# Patient Record
Sex: Male | Born: 1935
Health system: Southern US, Community
[De-identification: ages and names within clinical notes are randomized; demographics above are authoritative.]

## PROBLEM LIST (undated history)

## (undated) DIAGNOSIS — I480 Paroxysmal atrial fibrillation: Secondary | ICD-10-CM

## (undated) DIAGNOSIS — I639 Cerebral infarction, unspecified: Secondary | ICD-10-CM

## (undated) DIAGNOSIS — K219 Gastro-esophageal reflux disease without esophagitis: Secondary | ICD-10-CM

## (undated) DIAGNOSIS — I25810 Atherosclerosis of coronary artery bypass graft(s) without angina pectoris: Secondary | ICD-10-CM

## (undated) DIAGNOSIS — I10 Essential (primary) hypertension: Secondary | ICD-10-CM

## (undated) DIAGNOSIS — N400 Enlarged prostate without lower urinary tract symptoms: Secondary | ICD-10-CM

## (undated) DIAGNOSIS — I4510 Unspecified right bundle-branch block: Secondary | ICD-10-CM

## (undated) DIAGNOSIS — E785 Hyperlipidemia, unspecified: Secondary | ICD-10-CM

## (undated) HISTORY — DX: Essential (primary) hypertension: I10

## (undated) HISTORY — DX: Paroxysmal atrial fibrillation: I48.0

## (undated) HISTORY — PX: LOOP RECORDER IMPLANT: SHX5954

## (undated) HISTORY — PX: CARDIAC SURGERY: SHX584

## (undated) HISTORY — DX: Atherosclerosis of coronary artery bypass graft(s) without angina pectoris: I25.810

## (undated) HISTORY — DX: Unspecified right bundle-branch block: I45.10

## (undated) HISTORY — DX: Hyperlipidemia, unspecified: E78.5

---

## 1983-04-13 HISTORY — PX: PROSTATE SURGERY: SHX751

## 1998-04-12 HISTORY — PX: CARDIAC SURGERY: SHX584

## 1998-11-03 ENCOUNTER — Ambulatory Visit (HOSPITAL_COMMUNITY): Admission: RE | Admit: 1998-11-03 | Discharge: 1998-11-03 | Payer: Self-pay | Admitting: Cardiology

## 1998-11-24 ENCOUNTER — Encounter: Payer: Self-pay | Admitting: Cardiothoracic Surgery

## 1998-11-25 ENCOUNTER — Encounter: Payer: Self-pay | Admitting: Cardiothoracic Surgery

## 1998-11-25 ENCOUNTER — Inpatient Hospital Stay (HOSPITAL_COMMUNITY): Admission: RE | Admit: 1998-11-25 | Discharge: 1998-11-30 | Payer: Self-pay | Admitting: Cardiothoracic Surgery

## 1998-11-26 ENCOUNTER — Encounter: Payer: Self-pay | Admitting: Cardiothoracic Surgery

## 1998-11-27 ENCOUNTER — Encounter: Payer: Self-pay | Admitting: Cardiothoracic Surgery

## 1998-11-28 ENCOUNTER — Encounter: Payer: Self-pay | Admitting: Cardiology

## 1998-12-16 ENCOUNTER — Encounter (HOSPITAL_COMMUNITY): Admission: RE | Admit: 1998-12-16 | Discharge: 1999-03-16 | Payer: Self-pay | Admitting: Cardiology

## 2000-11-24 ENCOUNTER — Encounter: Payer: Self-pay | Admitting: Internal Medicine

## 2000-11-24 ENCOUNTER — Encounter: Admission: RE | Admit: 2000-11-24 | Discharge: 2000-11-24 | Payer: Self-pay | Admitting: Internal Medicine

## 2001-10-24 ENCOUNTER — Ambulatory Visit (HOSPITAL_COMMUNITY): Admission: RE | Admit: 2001-10-24 | Discharge: 2001-10-24 | Payer: Self-pay | Admitting: *Deleted

## 2003-04-13 HISTORY — PX: HERNIA REPAIR: SHX51

## 2004-10-19 ENCOUNTER — Ambulatory Visit (HOSPITAL_COMMUNITY): Admission: RE | Admit: 2004-10-19 | Discharge: 2004-10-20 | Payer: Self-pay | Admitting: General Surgery

## 2011-01-14 ENCOUNTER — Other Ambulatory Visit: Payer: Self-pay | Admitting: Dermatology

## 2011-04-22 DIAGNOSIS — H35379 Puckering of macula, unspecified eye: Secondary | ICD-10-CM | POA: Diagnosis not present

## 2011-04-26 DIAGNOSIS — I10 Essential (primary) hypertension: Secondary | ICD-10-CM | POA: Diagnosis not present

## 2011-04-26 DIAGNOSIS — E119 Type 2 diabetes mellitus without complications: Secondary | ICD-10-CM | POA: Diagnosis not present

## 2011-04-26 DIAGNOSIS — E785 Hyperlipidemia, unspecified: Secondary | ICD-10-CM | POA: Diagnosis not present

## 2011-04-26 DIAGNOSIS — Z125 Encounter for screening for malignant neoplasm of prostate: Secondary | ICD-10-CM | POA: Diagnosis not present

## 2011-04-29 DIAGNOSIS — E119 Type 2 diabetes mellitus without complications: Secondary | ICD-10-CM | POA: Diagnosis not present

## 2011-04-29 DIAGNOSIS — I10 Essential (primary) hypertension: Secondary | ICD-10-CM | POA: Diagnosis not present

## 2011-04-29 DIAGNOSIS — E7800 Pure hypercholesterolemia, unspecified: Secondary | ICD-10-CM | POA: Diagnosis not present

## 2011-07-20 DIAGNOSIS — L570 Actinic keratosis: Secondary | ICD-10-CM | POA: Diagnosis not present

## 2011-07-20 DIAGNOSIS — L821 Other seborrheic keratosis: Secondary | ICD-10-CM | POA: Diagnosis not present

## 2011-07-20 DIAGNOSIS — D239 Other benign neoplasm of skin, unspecified: Secondary | ICD-10-CM | POA: Diagnosis not present

## 2011-09-15 DIAGNOSIS — J449 Chronic obstructive pulmonary disease, unspecified: Secondary | ICD-10-CM | POA: Diagnosis not present

## 2011-09-15 DIAGNOSIS — R079 Chest pain, unspecified: Secondary | ICD-10-CM | POA: Diagnosis not present

## 2011-09-15 DIAGNOSIS — J984 Other disorders of lung: Secondary | ICD-10-CM | POA: Diagnosis not present

## 2011-10-19 DIAGNOSIS — H40059 Ocular hypertension, unspecified eye: Secondary | ICD-10-CM | POA: Diagnosis not present

## 2011-10-19 DIAGNOSIS — H2510 Age-related nuclear cataract, unspecified eye: Secondary | ICD-10-CM | POA: Diagnosis not present

## 2011-10-19 DIAGNOSIS — H35379 Puckering of macula, unspecified eye: Secondary | ICD-10-CM | POA: Diagnosis not present

## 2011-10-19 DIAGNOSIS — H353190 Nonexudative age-related macular degeneration, unspecified eye, stage unspecified: Secondary | ICD-10-CM | POA: Diagnosis not present

## 2011-11-01 DIAGNOSIS — E119 Type 2 diabetes mellitus without complications: Secondary | ICD-10-CM | POA: Diagnosis not present

## 2011-11-01 DIAGNOSIS — I10 Essential (primary) hypertension: Secondary | ICD-10-CM | POA: Diagnosis not present

## 2011-11-04 DIAGNOSIS — I10 Essential (primary) hypertension: Secondary | ICD-10-CM | POA: Diagnosis not present

## 2011-11-04 DIAGNOSIS — E119 Type 2 diabetes mellitus without complications: Secondary | ICD-10-CM | POA: Diagnosis not present

## 2011-11-11 DIAGNOSIS — H2589 Other age-related cataract: Secondary | ICD-10-CM | POA: Diagnosis not present

## 2011-11-11 DIAGNOSIS — H33009 Unspecified retinal detachment with retinal break, unspecified eye: Secondary | ICD-10-CM | POA: Diagnosis not present

## 2011-11-11 DIAGNOSIS — E119 Type 2 diabetes mellitus without complications: Secondary | ICD-10-CM | POA: Diagnosis not present

## 2011-11-11 DIAGNOSIS — H35059 Retinal neovascularization, unspecified, unspecified eye: Secondary | ICD-10-CM | POA: Diagnosis not present

## 2011-11-11 DIAGNOSIS — H353190 Nonexudative age-related macular degeneration, unspecified eye, stage unspecified: Secondary | ICD-10-CM | POA: Diagnosis not present

## 2011-11-11 DIAGNOSIS — H35379 Puckering of macula, unspecified eye: Secondary | ICD-10-CM | POA: Diagnosis not present

## 2011-12-02 DIAGNOSIS — R079 Chest pain, unspecified: Secondary | ICD-10-CM | POA: Diagnosis not present

## 2011-12-20 DIAGNOSIS — I2510 Atherosclerotic heart disease of native coronary artery without angina pectoris: Secondary | ICD-10-CM | POA: Diagnosis not present

## 2011-12-20 DIAGNOSIS — E785 Hyperlipidemia, unspecified: Secondary | ICD-10-CM | POA: Diagnosis not present

## 2011-12-20 DIAGNOSIS — I10 Essential (primary) hypertension: Secondary | ICD-10-CM | POA: Diagnosis not present

## 2011-12-28 DIAGNOSIS — H40059 Ocular hypertension, unspecified eye: Secondary | ICD-10-CM | POA: Diagnosis not present

## 2012-01-11 DIAGNOSIS — L821 Other seborrheic keratosis: Secondary | ICD-10-CM | POA: Diagnosis not present

## 2012-01-11 DIAGNOSIS — L570 Actinic keratosis: Secondary | ICD-10-CM | POA: Diagnosis not present

## 2012-01-11 DIAGNOSIS — Z85828 Personal history of other malignant neoplasm of skin: Secondary | ICD-10-CM | POA: Diagnosis not present

## 2012-01-26 DIAGNOSIS — Z23 Encounter for immunization: Secondary | ICD-10-CM | POA: Diagnosis not present

## 2012-04-26 DIAGNOSIS — N401 Enlarged prostate with lower urinary tract symptoms: Secondary | ICD-10-CM | POA: Diagnosis not present

## 2012-05-01 DIAGNOSIS — E119 Type 2 diabetes mellitus without complications: Secondary | ICD-10-CM | POA: Diagnosis not present

## 2012-05-01 DIAGNOSIS — I10 Essential (primary) hypertension: Secondary | ICD-10-CM | POA: Diagnosis not present

## 2012-05-04 DIAGNOSIS — N400 Enlarged prostate without lower urinary tract symptoms: Secondary | ICD-10-CM | POA: Diagnosis not present

## 2012-05-04 DIAGNOSIS — E119 Type 2 diabetes mellitus without complications: Secondary | ICD-10-CM | POA: Diagnosis not present

## 2012-05-04 DIAGNOSIS — I10 Essential (primary) hypertension: Secondary | ICD-10-CM | POA: Diagnosis not present

## 2012-06-27 DIAGNOSIS — H353190 Nonexudative age-related macular degeneration, unspecified eye, stage unspecified: Secondary | ICD-10-CM | POA: Diagnosis not present

## 2012-06-27 DIAGNOSIS — H40059 Ocular hypertension, unspecified eye: Secondary | ICD-10-CM | POA: Diagnosis not present

## 2012-07-18 ENCOUNTER — Other Ambulatory Visit: Payer: Self-pay | Admitting: Dermatology

## 2012-07-18 DIAGNOSIS — C44319 Basal cell carcinoma of skin of other parts of face: Secondary | ICD-10-CM | POA: Diagnosis not present

## 2012-07-18 DIAGNOSIS — L219 Seborrheic dermatitis, unspecified: Secondary | ICD-10-CM | POA: Diagnosis not present

## 2012-07-18 DIAGNOSIS — D485 Neoplasm of uncertain behavior of skin: Secondary | ICD-10-CM | POA: Diagnosis not present

## 2012-07-18 DIAGNOSIS — Z85828 Personal history of other malignant neoplasm of skin: Secondary | ICD-10-CM | POA: Diagnosis not present

## 2012-07-18 DIAGNOSIS — L821 Other seborrheic keratosis: Secondary | ICD-10-CM | POA: Diagnosis not present

## 2012-07-18 DIAGNOSIS — L570 Actinic keratosis: Secondary | ICD-10-CM | POA: Diagnosis not present

## 2012-10-30 DIAGNOSIS — E119 Type 2 diabetes mellitus without complications: Secondary | ICD-10-CM | POA: Diagnosis not present

## 2012-10-30 DIAGNOSIS — I10 Essential (primary) hypertension: Secondary | ICD-10-CM | POA: Diagnosis not present

## 2012-11-02 DIAGNOSIS — I10 Essential (primary) hypertension: Secondary | ICD-10-CM | POA: Diagnosis not present

## 2012-11-02 DIAGNOSIS — E119 Type 2 diabetes mellitus without complications: Secondary | ICD-10-CM | POA: Diagnosis not present

## 2012-11-02 DIAGNOSIS — N400 Enlarged prostate without lower urinary tract symptoms: Secondary | ICD-10-CM | POA: Diagnosis not present

## 2012-12-07 DIAGNOSIS — H33009 Unspecified retinal detachment with retinal break, unspecified eye: Secondary | ICD-10-CM | POA: Diagnosis not present

## 2012-12-07 DIAGNOSIS — H35059 Retinal neovascularization, unspecified, unspecified eye: Secondary | ICD-10-CM | POA: Diagnosis not present

## 2012-12-07 DIAGNOSIS — H35379 Puckering of macula, unspecified eye: Secondary | ICD-10-CM | POA: Diagnosis not present

## 2012-12-07 DIAGNOSIS — H2589 Other age-related cataract: Secondary | ICD-10-CM | POA: Diagnosis not present

## 2012-12-07 DIAGNOSIS — E119 Type 2 diabetes mellitus without complications: Secondary | ICD-10-CM | POA: Diagnosis not present

## 2012-12-07 DIAGNOSIS — H353190 Nonexudative age-related macular degeneration, unspecified eye, stage unspecified: Secondary | ICD-10-CM | POA: Diagnosis not present

## 2012-12-19 DIAGNOSIS — E785 Hyperlipidemia, unspecified: Secondary | ICD-10-CM | POA: Diagnosis not present

## 2012-12-19 DIAGNOSIS — I10 Essential (primary) hypertension: Secondary | ICD-10-CM | POA: Diagnosis not present

## 2012-12-19 DIAGNOSIS — I2510 Atherosclerotic heart disease of native coronary artery without angina pectoris: Secondary | ICD-10-CM | POA: Diagnosis not present

## 2013-01-04 DIAGNOSIS — H2510 Age-related nuclear cataract, unspecified eye: Secondary | ICD-10-CM | POA: Diagnosis not present

## 2013-01-04 DIAGNOSIS — H35379 Puckering of macula, unspecified eye: Secondary | ICD-10-CM | POA: Diagnosis not present

## 2013-01-04 DIAGNOSIS — H353190 Nonexudative age-related macular degeneration, unspecified eye, stage unspecified: Secondary | ICD-10-CM | POA: Diagnosis not present

## 2013-01-04 DIAGNOSIS — H25019 Cortical age-related cataract, unspecified eye: Secondary | ICD-10-CM | POA: Diagnosis not present

## 2013-01-18 DIAGNOSIS — D1739 Benign lipomatous neoplasm of skin and subcutaneous tissue of other sites: Secondary | ICD-10-CM | POA: Diagnosis not present

## 2013-01-18 DIAGNOSIS — L570 Actinic keratosis: Secondary | ICD-10-CM | POA: Diagnosis not present

## 2013-01-18 DIAGNOSIS — Z85828 Personal history of other malignant neoplasm of skin: Secondary | ICD-10-CM | POA: Diagnosis not present

## 2013-01-18 DIAGNOSIS — L821 Other seborrheic keratosis: Secondary | ICD-10-CM | POA: Diagnosis not present

## 2013-01-31 DIAGNOSIS — Z23 Encounter for immunization: Secondary | ICD-10-CM | POA: Diagnosis not present

## 2013-03-13 DIAGNOSIS — H2510 Age-related nuclear cataract, unspecified eye: Secondary | ICD-10-CM | POA: Diagnosis not present

## 2013-03-13 DIAGNOSIS — H2589 Other age-related cataract: Secondary | ICD-10-CM | POA: Diagnosis not present

## 2013-03-13 DIAGNOSIS — H25019 Cortical age-related cataract, unspecified eye: Secondary | ICD-10-CM | POA: Diagnosis not present

## 2013-04-13 ENCOUNTER — Telehealth: Payer: Self-pay | Admitting: Interventional Cardiology

## 2013-04-13 NOTE — Telephone Encounter (Signed)
Pt last seen by Dr Tamala Julian on 12/19/12.  BP at that office visit was 158/62.  Left message on machine for pt to contact the office.

## 2013-04-13 NOTE — Telephone Encounter (Signed)
New message     Blood pressure is rising. (176/70) pt wants to know if his medication need to change

## 2013-04-16 NOTE — Telephone Encounter (Signed)
called pt back.after pt returned home he realizes he is currently taking lisinopril 40mg  daily as well.adv pt that [place] the physician I consulted with had left today.adv him I would call him back tomorrow with bp med instructions.pt verbalized understanding.

## 2013-04-16 NOTE — Telephone Encounter (Signed)
returned pt call.per pt last o/v with Dr.Smith pt should be taking the following bp meds:Hctz 12.5mg  daily, atenolol 25mg  daily, lisinopril 40mg  daily, amlodopine 2.5mg  daily.pt sts that he bp has been running 170s/80s.pt denies any symptoms.pt sts that he has not been taking lisinopril.pt was at the store but will double check his bottles when he gets home. Per Dr.Smith o/v on 12/19/12 pt should be taking lisinopril.adv pt I would consult another physician in the office and call him back with their recommendation.pt verbalized understanding.

## 2013-04-16 NOTE — Telephone Encounter (Signed)
Follow up     Returning a nurses call from late friday

## 2013-04-17 MED ORDER — LISINOPRIL 40 MG PO TABS: 40.0000 mg | ORAL_TABLET | Freq: Every day | ORAL | Status: DC

## 2013-04-17 MED ORDER — NITROGLYCERIN 0.4 MG SL SUBL: 0.4000 mg | SUBLINGUAL_TABLET | SUBLINGUAL | Status: DC | PRN

## 2013-04-17 MED ORDER — HYDROCHLOROTHIAZIDE 12.5 MG PO CAPS: 12.5000 mg | ORAL_CAPSULE | Freq: Every day | ORAL | Status: DC

## 2013-04-17 MED ORDER — ATENOLOL 25 MG PO TABS: 25.0000 mg | ORAL_TABLET | Freq: Two times a day (BID) | ORAL | Status: DC

## 2013-04-17 MED ORDER — METFORMIN HCL 500 MG PO TABS: 250.0000 mg | ORAL_TABLET | Freq: Every day | ORAL | Status: DC

## 2013-04-17 MED ORDER — FINASTERIDE 5 MG PO TABS: 2.5000 mg | ORAL_TABLET | Freq: Every day | ORAL | Status: DC

## 2013-04-17 MED ORDER — FISH OIL 1200 MG PO CAPS: 1.0000 | ORAL_CAPSULE | Freq: Every day | ORAL | Status: DC

## 2013-04-17 MED ORDER — ASPIRIN EC 81 MG PO TBEC: 81.0000 mg | DELAYED_RELEASE_TABLET | Freq: Every day | ORAL | Status: DC

## 2013-04-17 MED ORDER — AMLODIPINE BESYLATE 5 MG PO TABS: 5.0000 mg | ORAL_TABLET | Freq: Every day | ORAL | Status: DC

## 2013-04-17 MED ORDER — OMEPRAZOLE 20 MG PO CPDR: 20.0000 mg | DELAYED_RELEASE_CAPSULE | Freq: Every day | ORAL | Status: DC

## 2013-04-17 MED ORDER — PRESERVISION AREDS 2 PO CAPS: 1.0000 | ORAL_CAPSULE | Freq: Every day | ORAL | Status: DC

## 2013-04-17 MED ORDER — SIMVASTATIN 40 MG PO TABS: 40.0000 mg | ORAL_TABLET | Freq: Every day | ORAL | Status: DC

## 2013-04-24 DIAGNOSIS — H25019 Cortical age-related cataract, unspecified eye: Secondary | ICD-10-CM | POA: Diagnosis not present

## 2013-04-24 DIAGNOSIS — H2510 Age-related nuclear cataract, unspecified eye: Secondary | ICD-10-CM | POA: Diagnosis not present

## 2013-04-24 DIAGNOSIS — H2589 Other age-related cataract: Secondary | ICD-10-CM | POA: Diagnosis not present

## 2013-05-07 DIAGNOSIS — I10 Essential (primary) hypertension: Secondary | ICD-10-CM | POA: Diagnosis not present

## 2013-05-07 DIAGNOSIS — E119 Type 2 diabetes mellitus without complications: Secondary | ICD-10-CM | POA: Diagnosis not present

## 2013-05-10 DIAGNOSIS — I10 Essential (primary) hypertension: Secondary | ICD-10-CM | POA: Diagnosis not present

## 2013-05-10 DIAGNOSIS — E785 Hyperlipidemia, unspecified: Secondary | ICD-10-CM | POA: Diagnosis not present

## 2013-05-10 DIAGNOSIS — N400 Enlarged prostate without lower urinary tract symptoms: Secondary | ICD-10-CM | POA: Diagnosis not present

## 2013-05-10 DIAGNOSIS — E119 Type 2 diabetes mellitus without complications: Secondary | ICD-10-CM | POA: Diagnosis not present

## 2013-05-11 DIAGNOSIS — N401 Enlarged prostate with lower urinary tract symptoms: Secondary | ICD-10-CM | POA: Diagnosis not present

## 2013-05-11 DIAGNOSIS — N139 Obstructive and reflux uropathy, unspecified: Secondary | ICD-10-CM | POA: Diagnosis not present

## 2013-05-21 ENCOUNTER — Telehealth: Payer: Self-pay | Admitting: Interventional Cardiology

## 2013-05-21 NOTE — Telephone Encounter (Signed)
New message         Pt would like for lisa to give him a call

## 2013-05-22 MED ORDER — AMLODIPINE BESYLATE 5 MG PO TABS: 5.0000 mg | ORAL_TABLET | Freq: Every day | ORAL | Status: DC

## 2013-05-22 NOTE — Telephone Encounter (Signed)
returned pt call pt rqst [facility]ri[facility] rx for Amlodopine 5mg  daily be faxed to the [place] attn: Dr.Heyat fax# [phone]. with identfier [id number]

## 2013-05-22 NOTE — Telephone Encounter (Addendum)
returned pt call.pt Bp medication Amlodipine was increased to 5 mg daily, under the direction of Dr.Skains, while pt primary cardiologist Dr.Smith was on vacation.  prior to increase pt bp readings were consistently elevated in the 170's/80's. Pt will continue to monitor bp at home.pt agreeable with plan and verbalized understanding.

## 2013-05-24 ENCOUNTER — Other Ambulatory Visit: Payer: Self-pay | Admitting: *Deleted

## 2013-05-24 DIAGNOSIS — H3560 Retinal hemorrhage, unspecified eye: Secondary | ICD-10-CM | POA: Diagnosis not present

## 2013-05-24 MED ORDER — AMLODIPINE BESYLATE 5 MG PO TABS: 5.0000 mg | ORAL_TABLET | Freq: Every day | ORAL | Status: DC

## 2013-06-21 DIAGNOSIS — H40019 Open angle with borderline findings, low risk, unspecified eye: Secondary | ICD-10-CM | POA: Diagnosis not present

## 2013-06-21 DIAGNOSIS — H3560 Retinal hemorrhage, unspecified eye: Secondary | ICD-10-CM | POA: Diagnosis not present

## 2013-07-25 NOTE — Telephone Encounter (Signed)
Please close this encounter

## 2013-08-07 DIAGNOSIS — E785 Hyperlipidemia, unspecified: Secondary | ICD-10-CM | POA: Diagnosis not present

## 2013-09-06 DIAGNOSIS — H3560 Retinal hemorrhage, unspecified eye: Secondary | ICD-10-CM | POA: Diagnosis not present

## 2013-11-05 DIAGNOSIS — I10 Essential (primary) hypertension: Secondary | ICD-10-CM | POA: Diagnosis not present

## 2013-11-05 DIAGNOSIS — E119 Type 2 diabetes mellitus without complications: Secondary | ICD-10-CM | POA: Diagnosis not present

## 2013-11-08 ENCOUNTER — Other Ambulatory Visit: Payer: Self-pay | Admitting: Internal Medicine

## 2013-11-08 ENCOUNTER — Other Ambulatory Visit (HOSPITAL_COMMUNITY): Payer: Self-pay | Admitting: Internal Medicine

## 2013-11-08 ENCOUNTER — Ambulatory Visit (HOSPITAL_COMMUNITY)
Admission: RE | Admit: 2013-11-08 | Discharge: 2013-11-08 | Disposition: A | Discharge status: Home / Self Care | Payer: Medicare Other | Source: Ambulatory Visit | Attending: Vascular Surgery | Admitting: Vascular Surgery

## 2013-11-08 DIAGNOSIS — I10 Essential (primary) hypertension: Secondary | ICD-10-CM | POA: Diagnosis not present

## 2013-11-08 DIAGNOSIS — R609 Edema, unspecified: Secondary | ICD-10-CM | POA: Diagnosis not present

## 2013-11-08 DIAGNOSIS — M79604 Pain in right leg: Secondary | ICD-10-CM

## 2013-11-08 DIAGNOSIS — E119 Type 2 diabetes mellitus without complications: Secondary | ICD-10-CM | POA: Diagnosis not present

## 2013-11-08 DIAGNOSIS — M79609 Pain in unspecified limb: Secondary | ICD-10-CM | POA: Diagnosis not present

## 2013-11-08 DIAGNOSIS — N400 Enlarged prostate without lower urinary tract symptoms: Secondary | ICD-10-CM | POA: Diagnosis not present

## 2013-12-13 DIAGNOSIS — H3560 Retinal hemorrhage, unspecified eye: Secondary | ICD-10-CM | POA: Diagnosis not present

## 2013-12-20 ENCOUNTER — Ambulatory Visit (INDEPENDENT_AMBULATORY_CARE_PROVIDER_SITE_OTHER): Payer: Medicare Other | Admitting: Interventional Cardiology

## 2013-12-20 ENCOUNTER — Encounter: Payer: Self-pay | Admitting: Interventional Cardiology

## 2013-12-20 VITALS — BP 152/54 | HR 49 | Ht 71.0 in | Wt 191.0 lb

## 2013-12-20 DIAGNOSIS — I25810 Atherosclerosis of coronary artery bypass graft(s) without angina pectoris: Secondary | ICD-10-CM | POA: Diagnosis not present

## 2013-12-20 DIAGNOSIS — E785 Hyperlipidemia, unspecified: Secondary | ICD-10-CM | POA: Diagnosis not present

## 2013-12-20 DIAGNOSIS — I10 Essential (primary) hypertension: Secondary | ICD-10-CM | POA: Insufficient documentation

## 2013-12-20 DIAGNOSIS — I4510 Unspecified right bundle-branch block: Secondary | ICD-10-CM

## 2013-12-20 HISTORY — DX: Atherosclerosis of coronary artery bypass graft(s) without angina pectoris: I25.810

## 2013-12-20 HISTORY — DX: Essential (primary) hypertension: I10

## 2013-12-20 HISTORY — DX: Hyperlipidemia, unspecified: E78.5

## 2013-12-20 HISTORY — DX: Unspecified right bundle-branch block: I45.10

## 2013-12-20 NOTE — Patient Instructions (Signed)
Your physician recommends that you continue on your current medications as directed. Please refer to the Current Medication list given to you today.  Your physician wants you to follow-up in: 1 year with Dr.Smith You will receive a reminder letter in the mail two months in advance. If you don't receive a letter, please call our office to schedule the follow-up appointment.  

## 2013-12-20 NOTE — Progress Notes (Signed)
Patient ID: [name], male   DOB: [birth year], 80 y.o.   MRN: [id number] Past Medical History  CASHD with CABG 2000 with LIMA to LAD, SV graft to diagonal, SVG to OM, and saphenous vein graft to distal RCA.   Hypertension   Hyperlipidemia   Benign prostatic hypertrophy   Chronic right bundle branch block   Melanoma      [address], [address], [place]  [address] Phone: [phone] Fax:  [phone]  Date:  12/20/2013   ID:  [name], DOB 08/05/1922, MRN [id number]  PCP:  Gregg Gravel, MD   ASSESSMENT:  1. Coronary artery disease, asymptomatic. Her bypass surgery 2000 2. Hyperlipidemia being managed excellently by his primary physician, Dr. [name] 3. Hypertension, controlled 4. Right bundle branch block with sinus bradycardia, no change compared to one year ago  PLAN:  1. Continue aerobic activity 2. Low-fat diet 3. Clinical followup in one year 4. No change in medical regimen   SUBJECTIVE: [name] is a 80 y.o. male who has no cardiovascular complaints. He has not had stroke or TIA like symptoms. He denies chest discomfort. No orthopnea or PND. There is stable lower extremity edema that has been present since bypass surgery in 2000. He denies palpitations and no current history of angina or syncope.   Wt Readings from Last 3 Encounters:  12/20/13 191 lb (86.637 kg)     No past medical history on file.  Current Outpatient Prescriptions  Medication Sig Dispense Refill  . amLODipine (NORVASC) 5 MG tablet Take 1 tablet (5 mg total) by mouth daily.  30 tablet  1  . aspirin EC 81 MG tablet Take 1 tablet (81 mg total) by mouth daily.      Marland Kitchen atenolol (TENORMIN) 25 MG tablet Take 1 tablet (25 mg total) by mouth 2 (two) times daily.      Marland Kitchen atorvastatin (LIPITOR) 40 MG tablet Take 40 mg by mouth daily.      . finasteride (PROSCAR) 5 MG tablet Take 0.5 tablets (2.5 mg total) by mouth daily.      . hydrochlorothiazide (MICROZIDE) 12.5 MG  capsule Take 1 capsule (12.5 mg total) by mouth daily.      Marland Kitchen lisinopril (PRINIVIL,ZESTRIL) 40 MG tablet Take 1 tablet (40 mg total) by mouth daily.      . metFORMIN (GLUCOPHAGE) 500 MG tablet Take 0.5 tablets (250 mg total) by mouth daily with breakfast.      . Multiple Vitamins-Minerals (PRESERVISION AREDS 2) CAPS Take 1 capsule by mouth daily.      Marland Kitchen omeprazole (PRILOSEC) 20 MG capsule Take 1 capsule (20 mg total) by mouth daily.       No current facility-administered medications for this visit.    Allergies:    Allergies  Allergen Reactions  . Monosodium Glutamate Anaphylaxis    Social History:  The patient    ROS:  Please see the history of present illness.   Denies blood in his urinary or stool. Denies claudication. No abdominal pain no history of ulcer.   All other systems reviewed and negative.   OBJECTIVE: VS:  BP 152/54  Pulse 49  Ht 5\' 11"  (1.803 m)  Wt 191 lb (86.637 kg)  BMI 26.65 kg/m2 Well nourished, well developed, in no acute distress, elderly but fit-appearing HEENT: normal Neck: JVD flat. Carotid bruit absent  Cardiac:  normal S1, S2; RRR; 2/6 systolic murmur right sternal border.  Lungs:  clear to auscultation  bilaterally, no wheezing, rhonchi or rales Abd: soft, nontender, no hepatomegaly Ext: Edema 1+ bilateral edema. Pulses 2+ bilateral lower extremities Skin: warm and dry Neuro:  CNs 2-12 intact, no focal abnormalities noted  EKG:  Right bundle branch block, left atrial abnormality, sinus       Signed, Illene Labrador III, MD 12/20/2013 4:23 PM

## 2014-01-22 ENCOUNTER — Other Ambulatory Visit: Payer: Self-pay | Admitting: Dermatology

## 2014-01-22 DIAGNOSIS — L570 Actinic keratosis: Secondary | ICD-10-CM | POA: Diagnosis not present

## 2014-01-22 DIAGNOSIS — L218 Other seborrheic dermatitis: Secondary | ICD-10-CM | POA: Diagnosis not present

## 2014-01-22 DIAGNOSIS — Z85828 Personal history of other malignant neoplasm of skin: Secondary | ICD-10-CM | POA: Diagnosis not present

## 2014-01-22 DIAGNOSIS — D171 Benign lipomatous neoplasm of skin and subcutaneous tissue of trunk: Secondary | ICD-10-CM | POA: Diagnosis not present

## 2014-01-22 DIAGNOSIS — L821 Other seborrheic keratosis: Secondary | ICD-10-CM | POA: Diagnosis not present

## 2014-01-22 DIAGNOSIS — D485 Neoplasm of uncertain behavior of skin: Secondary | ICD-10-CM | POA: Diagnosis not present

## 2014-02-04 DIAGNOSIS — I10 Essential (primary) hypertension: Secondary | ICD-10-CM | POA: Diagnosis not present

## 2014-02-04 DIAGNOSIS — E119 Type 2 diabetes mellitus without complications: Secondary | ICD-10-CM | POA: Diagnosis not present

## 2014-02-07 DIAGNOSIS — E119 Type 2 diabetes mellitus without complications: Secondary | ICD-10-CM | POA: Diagnosis not present

## 2014-02-07 DIAGNOSIS — E785 Hyperlipidemia, unspecified: Secondary | ICD-10-CM | POA: Diagnosis not present

## 2014-02-07 DIAGNOSIS — Z23 Encounter for immunization: Secondary | ICD-10-CM | POA: Diagnosis not present

## 2014-02-07 DIAGNOSIS — I10 Essential (primary) hypertension: Secondary | ICD-10-CM | POA: Diagnosis not present

## 2014-02-08 ENCOUNTER — Encounter: Payer: Self-pay | Admitting: Interventional Cardiology

## 2014-03-18 DIAGNOSIS — H3532 Exudative age-related macular degeneration: Secondary | ICD-10-CM | POA: Diagnosis not present

## 2014-03-21 DIAGNOSIS — H35373 Puckering of macula, bilateral: Secondary | ICD-10-CM | POA: Diagnosis not present

## 2014-03-21 DIAGNOSIS — H3531 Nonexudative age-related macular degeneration: Secondary | ICD-10-CM | POA: Diagnosis not present

## 2014-03-21 DIAGNOSIS — H3532 Exudative age-related macular degeneration: Secondary | ICD-10-CM | POA: Diagnosis not present

## 2014-03-30 ENCOUNTER — Inpatient Hospital Stay (HOSPITAL_COMMUNITY): Payer: Medicare Other

## 2014-03-30 ENCOUNTER — Emergency Department (HOSPITAL_COMMUNITY): Payer: Medicare Other

## 2014-03-30 ENCOUNTER — Encounter (HOSPITAL_COMMUNITY): Payer: Self-pay | Admitting: *Deleted

## 2014-03-30 ENCOUNTER — Inpatient Hospital Stay (HOSPITAL_COMMUNITY)
Admission: EM | Admit: 2014-03-30 | Discharge: 2014-04-02 | DRG: 065 | Disposition: A | Discharge status: Home / Self Care | Payer: Medicare Other | Attending: Internal Medicine | Admitting: Internal Medicine

## 2014-03-30 DIAGNOSIS — I63132 Cerebral infarction due to embolism of left carotid artery: Secondary | ICD-10-CM | POA: Diagnosis not present

## 2014-03-30 DIAGNOSIS — I10 Essential (primary) hypertension: Secondary | ICD-10-CM | POA: Diagnosis present

## 2014-03-30 DIAGNOSIS — I25700 Atherosclerosis of coronary artery bypass graft(s), unspecified, with unstable angina pectoris: Secondary | ICD-10-CM | POA: Diagnosis not present

## 2014-03-30 DIAGNOSIS — I4510 Unspecified right bundle-branch block: Secondary | ICD-10-CM | POA: Diagnosis present

## 2014-03-30 DIAGNOSIS — I25810 Atherosclerosis of coronary artery bypass graft(s) without angina pectoris: Secondary | ICD-10-CM | POA: Diagnosis present

## 2014-03-30 DIAGNOSIS — Z951 Presence of aortocoronary bypass graft: Secondary | ICD-10-CM | POA: Diagnosis not present

## 2014-03-30 DIAGNOSIS — I6521 Occlusion and stenosis of right carotid artery: Secondary | ICD-10-CM | POA: Diagnosis present

## 2014-03-30 DIAGNOSIS — I6523 Occlusion and stenosis of bilateral carotid arteries: Secondary | ICD-10-CM | POA: Diagnosis not present

## 2014-03-30 DIAGNOSIS — R55 Syncope and collapse: Secondary | ICD-10-CM

## 2014-03-30 DIAGNOSIS — I340 Nonrheumatic mitral (valve) insufficiency: Secondary | ICD-10-CM | POA: Diagnosis not present

## 2014-03-30 DIAGNOSIS — R200 Anesthesia of skin: Secondary | ICD-10-CM | POA: Diagnosis not present

## 2014-03-30 DIAGNOSIS — E785 Hyperlipidemia, unspecified: Secondary | ICD-10-CM | POA: Diagnosis not present

## 2014-03-30 DIAGNOSIS — S299XXA Unspecified injury of thorax, initial encounter: Secondary | ICD-10-CM | POA: Diagnosis not present

## 2014-03-30 DIAGNOSIS — Z7982 Long term (current) use of aspirin: Secondary | ICD-10-CM

## 2014-03-30 DIAGNOSIS — I63512 Cerebral infarction due to unspecified occlusion or stenosis of left middle cerebral artery: Secondary | ICD-10-CM | POA: Diagnosis not present

## 2014-03-30 DIAGNOSIS — S0990XA Unspecified injury of head, initial encounter: Secondary | ICD-10-CM | POA: Diagnosis not present

## 2014-03-30 DIAGNOSIS — Z888 Allergy status to other drugs, medicaments and biological substances status: Secondary | ICD-10-CM | POA: Diagnosis not present

## 2014-03-30 DIAGNOSIS — I639 Cerebral infarction, unspecified: Secondary | ICD-10-CM | POA: Diagnosis not present

## 2014-03-30 DIAGNOSIS — I6503 Occlusion and stenosis of bilateral vertebral arteries: Secondary | ICD-10-CM | POA: Diagnosis not present

## 2014-03-30 DIAGNOSIS — I6789 Other cerebrovascular disease: Secondary | ICD-10-CM | POA: Diagnosis not present

## 2014-03-30 DIAGNOSIS — I369 Nonrheumatic tricuspid valve disorder, unspecified: Secondary | ICD-10-CM | POA: Diagnosis not present

## 2014-03-30 LAB — COMPREHENSIVE METABOLIC PANEL
ALBUMIN: 3.8 g/dL (ref 3.5–5.2)
ALT: 11 U/L (ref 0–53)
ANION GAP: 12 (ref 5–15)
AST: 15 U/L (ref 0–37)
Alkaline Phosphatase: 78 U/L (ref 39–117)
BILIRUBIN TOTAL: 0.6 mg/dL (ref 0.3–1.2)
BUN: 23 mg/dL (ref 6–23)
CALCIUM: 9 mg/dL (ref 8.4–10.5)
CHLORIDE: 106 meq/L (ref 96–112)
CO2: 24 mEq/L (ref 19–32)
CREATININE: 0.96 mg/dL (ref 0.50–1.35)
GFR calc Af Amer: 83 mL/min — ABNORMAL LOW (ref >90)
GFR calc non Af Amer: 71 mL/min — ABNORMAL LOW (ref >90)
Glucose, Bld: 108 mg/dL — ABNORMAL HIGH (ref 70–99)
Potassium: 4.1 mEq/L (ref 3.7–5.3)
Sodium: 142 mEq/L (ref 137–147)
TOTAL PROTEIN: 6.5 g/dL (ref 6.0–8.3)

## 2014-03-30 LAB — TROPONIN I: Troponin I: <0.3 ng/mL (ref <0.30)

## 2014-03-30 LAB — CBC WITH DIFFERENTIAL/PLATELET
BASOS ABS: 0 10*3/uL (ref 0.0–0.1)
BASOS PCT: 0 % (ref 0–1)
EOS ABS: 0.1 10*3/uL (ref 0.0–0.7)
Eosinophils Relative: 2 % (ref 0–5)
HEMATOCRIT: 39.6 % (ref 39.0–52.0)
Hemoglobin: 13.4 g/dL (ref 13.0–17.0)
Lymphocytes Relative: 13 % (ref 12–46)
Lymphs Abs: 0.8 10*3/uL (ref 0.7–4.0)
MCH: 29.5 pg (ref 26.0–34.0)
MCHC: 33.8 g/dL (ref 30.0–36.0)
MCV: 87.2 fL (ref 78.0–100.0)
MONO ABS: 0.5 10*3/uL (ref 0.1–1.0)
Monocytes Relative: 9 % (ref 3–12)
Neutro Abs: 4.7 10*3/uL (ref 1.7–7.7)
Neutrophils Relative %: 76 % (ref 43–77)
Platelets: 207 10*3/uL (ref 150–400)
RBC: 4.54 MIL/uL (ref 4.22–5.81)
RDW: 13.9 % (ref 11.5–15.5)
WBC: 6.2 10*3/uL (ref 4.0–10.5)

## 2014-03-30 MED ORDER — FINASTERIDE 5 MG PO TABS
2.5000 mg | ORAL_TABLET | Freq: Every day | ORAL | Status: DC
  Administered 2014-03-30 – 2014-04-02 (×4): 2.5 mg via ORAL
  Filled 2014-03-30 (×4): qty 1

## 2014-03-30 MED ORDER — LISINOPRIL 20 MG PO TABS
40.0000 mg | ORAL_TABLET | Freq: Every day | ORAL | Status: DC
  Administered 2014-03-31 – 2014-04-02 (×3): 40 mg via ORAL
  Filled 2014-03-30 (×3): qty 2

## 2014-03-30 MED ORDER — SODIUM CHLORIDE 0.9 % IV SOLN: INTRAVENOUS | Status: DC

## 2014-03-30 MED ORDER — AMLODIPINE BESYLATE 5 MG PO TABS
5.0000 mg | ORAL_TABLET | Freq: Every day | ORAL | Status: DC
  Administered 2014-03-30: 5 mg via ORAL
  Filled 2014-03-30: qty 1

## 2014-03-30 MED ORDER — HYDRALAZINE HCL 20 MG/ML IJ SOLN: 10.0000 mg | Freq: Four times a day (QID) | INTRAMUSCULAR | Status: DC | PRN

## 2014-03-30 MED ORDER — PANTOPRAZOLE SODIUM 40 MG PO TBEC
40.0000 mg | DELAYED_RELEASE_TABLET | Freq: Every day | ORAL | Status: DC
  Administered 2014-03-30 – 2014-04-02 (×4): 40 mg via ORAL
  Filled 2014-03-30 (×4): qty 1

## 2014-03-30 MED ORDER — ATENOLOL 25 MG PO TABS
25.0000 mg | ORAL_TABLET | Freq: Two times a day (BID) | ORAL | Status: DC
  Administered 2014-03-30: 25 mg via ORAL
  Filled 2014-03-30: qty 1

## 2014-03-30 MED ORDER — PRESERVISION AREDS 2 PO CAPS: 1.0000 | ORAL_CAPSULE | Freq: Every day | ORAL | Status: DC

## 2014-03-30 MED ORDER — OCUVITE-LUTEIN PO CAPS
1.0000 | ORAL_CAPSULE | Freq: Every day | ORAL | Status: DC
Start: 2014-03-30 — End: 2014-04-02
  Administered 2014-03-30 – 2014-04-02 (×4): 1 via ORAL
  Filled 2014-03-30 (×4): qty 1

## 2014-03-30 MED ORDER — ASPIRIN 300 MG RE SUPP: 300.0000 mg | Freq: Every day | RECTAL | Status: DC

## 2014-03-30 MED ORDER — ATORVASTATIN CALCIUM 40 MG PO TABS: 40.0000 mg | ORAL_TABLET | Freq: Every day | ORAL | Status: DC

## 2014-03-30 MED ORDER — METFORMIN HCL 500 MG PO TABS: 250.0000 mg | ORAL_TABLET | Freq: Every day | ORAL | Status: DC

## 2014-03-30 MED ORDER — ASPIRIN 325 MG PO TABS
325.0000 mg | ORAL_TABLET | Freq: Every day | ORAL | Status: DC
  Administered 2014-03-31 – 2014-04-02 (×3): 325 mg via ORAL
  Filled 2014-03-30 (×4): qty 1

## 2014-03-30 MED ORDER — LISINOPRIL 20 MG PO TABS
40.0000 mg | ORAL_TABLET | Freq: Every day | ORAL | Status: DC
  Administered 2014-03-30: 40 mg via ORAL
  Filled 2014-03-30: qty 2

## 2014-03-30 MED ORDER — ATENOLOL 25 MG PO TABS
25.0000 mg | ORAL_TABLET | Freq: Every day | ORAL | Status: DC
  Administered 2014-04-01 – 2014-04-02 (×2): 25 mg via ORAL
  Filled 2014-03-30 (×3): qty 1

## 2014-03-30 MED ORDER — HYDROCHLOROTHIAZIDE 12.5 MG PO CAPS
12.5000 mg | ORAL_CAPSULE | Freq: Every day | ORAL | Status: DC
  Administered 2014-03-30: 12.5 mg via ORAL
  Filled 2014-03-30: qty 1

## 2014-03-30 MED ORDER — HEPARIN SODIUM (PORCINE) 5000 UNIT/ML IJ SOLN
5000.0000 [IU] | Freq: Three times a day (TID) | INTRAMUSCULAR | Status: DC
  Administered 2014-03-30 – 2014-04-02 (×9): 5000 [IU] via SUBCUTANEOUS
  Filled 2014-03-30 (×10): qty 1

## 2014-03-30 MED ORDER — SENNOSIDES-DOCUSATE SODIUM 8.6-50 MG PO TABS
1.0000 | ORAL_TABLET | Freq: Every evening | ORAL | Status: DC | PRN
Start: 2014-03-30 — End: 2014-04-02

## 2014-03-30 MED ORDER — IOHEXOL 350 MG/ML SOLN
50.0000 mL | Freq: Once | INTRAVENOUS | Status: AC | PRN
  Administered 2014-03-30: 50 mL via INTRAVENOUS

## 2014-03-30 MED ORDER — ATENOLOL 25 MG PO TABS: 25.0000 mg | ORAL_TABLET | Freq: Every day | ORAL | Status: DC

## 2014-03-30 MED ORDER — ASPIRIN EC 81 MG PO TBEC: 81.0000 mg | DELAYED_RELEASE_TABLET | Freq: Every day | ORAL | Status: DC

## 2014-03-30 MED ORDER — AMLODIPINE BESYLATE 5 MG PO TABS
5.0000 mg | ORAL_TABLET | Freq: Every day | ORAL | Status: DC
  Administered 2014-03-31 – 2014-04-02 (×3): 5 mg via ORAL
  Filled 2014-03-30 (×3): qty 1

## 2014-03-30 MED ORDER — STROKE: EARLY STAGES OF RECOVERY BOOK
Freq: Once | Status: AC
  Administered 2014-03-30: 16:00:00
  Filled 2014-03-30: qty 1

## 2014-03-30 NOTE — H&P (Signed)
PATIENT DETAILS Name: [name] Age: 80 y.o. Sex: male Date of Birth: 11/20/1922 Admit Date: 03/30/2014 PCP:KIM, Jeneen Rinks, MD   CHIEF COMPLAINT:  Weakness-initially generalized-then right lower extremity weakness  HPI: [name] is a 80 y.o. male with a Past Medical History of CAD status post CABG, dyslipidemia, hypertension who presents today with the above noted complaint. Per patient, upon awakening this a.m. patient went to urinate, while urinating, patient had sudden onset of generalized weakness and fell onto the floor without losing any consciousness. While on the floor he slowly regained strength in all of his extremities except his right lower extremity. He eventually was able to call several family members for help, he claims that his right lower extremity was weak for around 15-20 minutes before recovering to usual baseline. He was subsequently brought to the emergency room, a CT scan of the head was negative for acute abnormalities. I was subsequently asked to admit this patient for further treatment. Per family and patient no speech difficulty. No history of headache, chest pain, shortness of breath, nausea, vomiting or diarrhea.   ALLERGIES:   Allergies  Allergen Reactions  . Monosodium Glutamate Anaphylaxis    PAST MEDICAL HISTORY: Past Medical History  Diagnosis Date  . Right bundle branch block 12/20/2013  . Hyperlipidemia 12/20/2013  . Essential hypertension 12/20/2013  . Coronary atherosclerosis of artery bypass graft 12/20/2013    CASHD with CABG 2000 with LIMA to LAD, SV graft to diagonal, SVG to OM, and saphenous vein graft to distal RCA.       PAST SURGICAL HISTORY: History reviewed. No pertinent past surgical history.  MEDICATIONS AT HOME: Prior to Admission medications   Medication Sig Start Date End Date Taking? Authorizing Provider  amLODipine (NORVASC) 5 MG tablet Take 1 tablet (5 mg total) by mouth daily. 05/24/13  Yes Belva Crome  III, MD  aspirin EC 81 MG tablet Take 1 tablet (81 mg total) by mouth daily. 04/17/13  Yes Belva Crome III, MD  atenolol (TENORMIN) 25 MG tablet Take 1 tablet (25 mg total) by mouth 2 (two) times daily. Patient taking differently: Take 25 mg by mouth daily.  04/17/13  Yes Belva Crome III, MD  atorvastatin (LIPITOR) 40 MG tablet Take 40 mg by mouth daily.   Yes Historical Provider, MD  finasteride (PROSCAR) 5 MG tablet Take 0.5 tablets (2.5 mg total) by mouth daily. 04/17/13  Yes Belva Crome III, MD  hydrochlorothiazide (MICROZIDE) 12.5 MG capsule Take 1 capsule (12.5 mg total) by mouth daily. 04/17/13  Yes Belva Crome III, MD  lisinopril (PRINIVIL,ZESTRIL) 40 MG tablet Take 1 tablet (40 mg total) by mouth daily. 04/17/13  Yes Belva Crome III, MD  metFORMIN (GLUCOPHAGE) 500 MG tablet Take 0.5 tablets (250 mg total) by mouth daily with breakfast. 04/17/13  Yes Sinclair Grooms, MD  Multiple Vitamins-Minerals (PRESERVISION AREDS 2) CAPS Take 1 capsule by mouth daily. Patient taking differently: Take 1 capsule by mouth 2 (two) times daily.  04/17/13  Yes Belva Crome III, MD  omeprazole (PRILOSEC) 20 MG capsule Take 1 capsule (20 mg total) by mouth daily. 04/17/13  Yes Sinclair Grooms, MD    FAMILY HISTORY: Family History  Problem Relation Age of Onset  . Hypertension Mother     SOCIAL HISTORY:  reports that he has never smoked. He does not have any smokeless tobacco history on file. He reports that he  does not drink alcohol or use illicit drugs.  REVIEW OF SYSTEMS:  Constitutional:   No  weight loss, night sweats,  Fevers, chills, fatigue.  HEENT:    No headaches, Difficulty swallowing,Tooth/dental problems,Sore throat,  No sneezing, itching, ear ache, nasal congestion, post nasal drip,   Cardio-vascular: No chest pain,  Orthopnea, PND, swelling in lower extremities, anasarca,  dizziness, palpitations  GI:  No heartburn, indigestion, abdominal pain, nausea, vomiting, diarrhea, change in        bowel habits, loss of appetite  Resp: No shortness of breath with exertion or at rest.  No excess mucus, no productive cough, No non-productive cough,  No coughing up of blood.No change in color of mucus.No wheezing.No chest wall deformity  Skin:  no rash or lesions.  GU:  no dysuria, change in color of urine, no urgency or frequency.  No flank pain.  Musculoskeletal: No joint pain or swelling.  No decreased range of motion.  No back pain.  Psych: No change in mood or affect. No depression or anxiety.  No memory loss.   PHYSICAL EXAM: Blood pressure 171/62, pulse 47, temperature 97.6 F (36.4 C), temperature source Oral, resp. rate 14, SpO2 95 %.  General appearance :Awake, alert, not in any distress. Speech Clear. Not toxic Looking HEENT: Atraumatic and Normocephalic, pupils equally reactive to light and accomodation Neck: supple, no JVD. No cervical lymphadenopathy.  Chest:Good air entry bilaterally, no added sounds  CVS: S1 S2 regular, no murmurs.  Abdomen: Bowel sounds present, Non tender and not distended with no gaurding, rigidity or rebound. Extremities: B/L Lower Ext shows no edema, both legs are warm to touch Neurology:  Non focal Skin:No Rash Wounds:N/A  LABS ON ADMISSION:   Recent Labs  03/30/14 1026  NA 142  K 4.1  CL 106  CO2 24  GLUCOSE 108*  BUN 23  CREATININE 0.96  CALCIUM 9.0    Recent Labs  03/30/14 1026  AST 15  ALT 11  ALKPHOS 78  BILITOT 0.6  PROT 6.5  ALBUMIN 3.8   No results for input(s): LIPASE, AMYLASE in the last 72 hours.  Recent Labs  03/30/14 1026  WBC 6.2  NEUTROABS 4.7  HGB 13.4  HCT 39.6  MCV 87.2  PLT 207    Recent Labs  03/30/14 1026  TROPONINI <0.30   No results for input(s): DDIMER in the last 72 hours. Invalid input(s): POCBNP   RADIOLOGIC STUDIES ON ADMISSION: Ct Head Wo Contrast  03/30/2014   CLINICAL DATA:  Syncopal episode, history of CABG, history of hypertension, history of right  bundle-branch block  EXAM: CT HEAD WITHOUT CONTRAST  TECHNIQUE: Contiguous axial images were obtained from the base of the skull through the vertex without intravenous contrast.  COMPARISON:  None.  FINDINGS: There is no evidence of mass effect, midline shift, or extra-axial fluid collections. There is no evidence of a space-occupying lesion or intracranial hemorrhage. There is no evidence of a cortical-based area of acute infarction. There is generalized cerebral atrophy. There is mild cerebellar atrophy. There is periventricular white matter low attenuation likely secondary to microangiopathy.  The ventricles and sulci are appropriate for the patient's age. The basal cisterns are patent.  Visualized portions of the orbits are unremarkable. The visualized portions of the paranasal sinuses and mastoid air cells are unremarkable. Cerebrovascular atherosclerotic calcifications are noted.  The osseous structures are unremarkable.  IMPRESSION: 1. No acute intracranial pathology. 2. Generalized cerebral atrophy and chronic microvascular disease.   Electronically Signed  By: Kathreen Devoid   On: 03/30/2014 11:04     EKG: Independently reviewed. Normal sinus rhythm  ASSESSMENT AND PLAN: Present on Admission:  . ? Acute CVA (cerebral infarction): Admit to neuro telemetry, MRI brain, per neuro checks CTA neck/head, 2-D echocardiogram. Lipid panel and A1c will be ordered. Was on ASA 81 mg prior to admission, changed to ASA 325 mg. Await further recommendations from neurology   . Hyperlipidemia: Continue statins, follow lipid panel.   . Essential hypertension: Allow some permissive hypertension, resume antihypertensives tomorrow. As needed IV hydralazine for now   . Coronary atherosclerosis of artery bypass graft: Continue aspirin/statins. Asymptomatic without any chest pain or shortness of breath   . Right bundle branch block: Continue telemetry  Further plan will depend as patient's clinical course evolves  and further radiologic and laboratory data become available. Patient will be monitored closely.  Above noted plan was discussed with patient/family, they were in agreement.   DVT Prophylaxis: Prophylactic Heparin  Code Status: Full Code  Disposition Plan: Home when workup complete.  Total time spent for admission equals 45 minutes.  [facility] Pager [phone]  If 7PM-7AM, please contact night-coverage [email or web address] Password [email or web address] 03/30/2014, 1:54 PM

## 2014-03-30 NOTE — ED Notes (Signed)
Pt arrived by [facility]. Pt reports getting up this am and went to restroom, while he was in restroom he had episode of right leg weakness/numbness and pt fell. The numbness has resolved pta. Pt hypertensive pta.

## 2014-03-30 NOTE — Consult Note (Signed)
Reason for Consult:Weakness Referring Physician: Audie Pinto  CC: Weakness  HPI: [name] is an 80 y.o. male who reports that he awakened this morning as usual.  He went to urinate and afterward became weak all over falling to the floor.  He did not lose consciousness.  He remained weak while on the floor but slowly had improvement in his strength with the left side returning some strength prior to the left and the RLE being the last to recover some strength.  He was eventually able to pull himself up to the toilet and to call his wife.  No speech abnormalities were noted.  He finally got some help to get to the bed and EMS was called.  Patient  Was brought in for evaluation.  Reports no significant weakness at this time.    Past Medical History  Diagnosis Date  . Right bundle branch block 12/20/2013  . Hyperlipidemia 12/20/2013  . Essential hypertension 12/20/2013  . Coronary atherosclerosis of artery bypass graft 12/20/2013    CASHD with CABG 2000 with LIMA to LAD, SV graft to diagonal, SVG to OM, and saphenous vein graft to distal RCA.       History reviewed. No pertinent past surgical history.  Family History  Problem Relation Age of Onset  . Hypertension Mother     Social History:  reports that he has never smoked. He does not have any smokeless tobacco history on file. He reports that he does not drink alcohol or use illicit drugs.  Allergies  Allergen Reactions  . Monosodium Glutamate Anaphylaxis    Medications: I have reviewed the patient's current medications. Prior to Admission:  Current outpatient prescriptions:  amLODipine (NORVASC) 5 MG tablet, Take 1 tablet (5 mg total) by mouth daily., Disp: 30 tablet, Rfl: 1;   aspirin EC 81 MG tablet, Take 1 tablet (81 mg total) by mouth daily., Disp: , Rfl: ;   atenolol (TENORMIN) 25 MG tablet, Take 1 tablet (25 mg total) by mouth 2 (two) times daily., Disp: , Rfl: ;   atorvastatin (LIPITOR) 40 MG tablet, Take 40 mg by mouth  daily., Disp: , Rfl:  finasteride (PROSCAR) 5 MG tablet, Take 0.5 tablets (2.5 mg total) by mouth daily., Disp: , Rfl: ;   hydrochlorothiazide (MICROZIDE) 12.5 MG capsule, Take 1 capsule (12.5 mg total) by mouth daily., Disp: , Rfl: ;   lisinopril (PRINIVIL,ZESTRIL) 40 MG tablet, Take 1 tablet (40 mg total) by mouth daily., Disp: , Rfl: ;   metFORMIN (GLUCOPHAGE) 500 MG tablet, Take 0.5 tablets (250 mg total) by mouth daily with breakfast., Disp: , Rfl:  Multiple Vitamins-Minerals (PRESERVISION AREDS 2) CAPS, Take 1 capsule by mouth daily., Disp: , Rfl: ;   omeprazole (PRILOSEC) 20 MG capsule, Take 1 capsule (20 mg total) by mouth daily., Disp: , Rfl:   ROS: History obtained from the patient  General ROS: negative for - chills, fatigue, fever, night sweats, weight gain or weight loss Psychological ROS: negative for - behavioral disorder, hallucinations, memory difficulties, mood swings or suicidal ideation Ophthalmic ROS: negative for - blurry vision, double vision, eye pain or loss of vision ENT ROS: negative for - epistaxis, nasal discharge, oral lesions, sore throat, tinnitus or vertigo Allergy and Immunology ROS: negative for - hives or itchy/watery eyes Hematological and Lymphatic ROS: negative for - bleeding problems, bruising or swollen lymph nodes Endocrine ROS: negative for - galactorrhea, hair pattern changes, polydipsia/polyuria or temperature intolerance Respiratory ROS: negative for - cough, hemoptysis, shortness of breath  or wheezing Cardiovascular ROS: negative for - chest pain, dyspnea on exertion, edema or irregular heartbeat Gastrointestinal ROS: negative for - abdominal pain, diarrhea, hematemesis, nausea/vomiting or stool incontinence Genito-Urinary ROS: negative for - dysuria, hematuria, incontinence or urinary frequency/urgency Musculoskeletal ROS: RLE swelling Neurological ROS: as noted in HPI Dermatological ROS: negative for rash and skin lesion changes  Physical  Examination: Blood pressure 182/110, pulse 54, temperature 97.6 F (36.4 C), temperature source Oral, resp. rate 20, SpO2 97 %.  HEENT-  Normocephalic, no lesions, without obvious abnormality.  Normal external eye and conjunctiva.  Normal TM's bilaterally.  Normal auditory canals and external ears. Normal external nose, mucus membranes and septum.  Normal pharynx. Cardiovascular- S1, S2 normal, pulses palpable throughout   Lungs- chest clear, no wheezing, rales, normal symmetric air entry Abdomen- soft, non-tender; bowel sounds normal; no masses,  no organomegaly Extremities- mild lower extremity edema Lymph-no adenopathy palpable Musculoskeletal-no joint tenderness, deformity or swelling Skin-LE scars from graft retrieval  Neurological Examination Mental Status: Alert, oriented, thought content appropriate.  Speech fluent without evidence of aphasia.  Able to follow 3 step commands without difficulty. Cranial Nerves: II: Discs flat bilaterally; Visual fields grossly normal, pupils equal, round, reactive to light and accommodation III,IV, VI: ptosis not present, extra-ocular motions intact bilaterally V,VII: smile symmetric, facial light touch sensation normal bilaterally VIII: hearing normal bilaterally IX,X: gag reflex present XI: bilateral shoulder shrug XII: midline tongue extension Motor: Right : Upper extremity   5/5    Left:     Upper extremity   5/5  Lower extremity   5/5     Lower extremity   5/5 Tone and bulk:normal tone throughout; no atrophy noted Sensory: Pinprick and light touch intact throughout, bilaterally Deep Tendon Reflexes: 2+ and symmetric throughout Plantars: Right: downgoing   Left: downgoing Cerebellar: normal finger-to-nose, normal rapid alternating movements and normal heel-to-shin test Gait: normal gait and station      Laboratory Studies:   Basic Metabolic Panel:  Recent Labs Lab 03/30/14 1026  NA 142  K 4.1  CL 106  CO2 24  GLUCOSE 108*   BUN 23  CREATININE 0.96  CALCIUM 9.0    Liver Function Tests:  Recent Labs Lab 03/30/14 1026  AST 15  ALT 11  ALKPHOS 78  BILITOT 0.6  PROT 6.5  ALBUMIN 3.8   No results for input(s): LIPASE, AMYLASE in the last 168 hours. No results for input(s): AMMONIA in the last 168 hours.  CBC:  Recent Labs Lab 03/30/14 1026  WBC 6.2  NEUTROABS 4.7  HGB 13.4  HCT 39.6  MCV 87.2  PLT 207    Cardiac Enzymes:  Recent Labs Lab 03/30/14 1026  TROPONINI <0.30    BNP: Invalid input(s): POCBNP  CBG: No results for input(s): GLUCAP in the last 168 hours.  Microbiology: No results found for this or any previous visit.  Coagulation Studies: No results for input(s): LABPROT, INR in the last 72 hours.  Urinalysis: No results for input(s): COLORURINE, LABSPEC, PHURINE, GLUCOSEU, HGBUR, BILIRUBINUR, KETONESUR, PROTEINUR, UROBILINOGEN, NITRITE, LEUKOCYTESUR in the last 168 hours.  Invalid input(s): APPERANCEUR  Lipid Panel:  No results found for: CHOL, TRIG, HDL, CHOLHDL, VLDL, LDLCALC  HgbA1C: No results found for: HGBA1C  Urine Drug Screen:  No results found for: LABOPIA, COCAINSCRNUR, LABBENZ, AMPHETMU, THCU, LABBARB  Alcohol Level: No results for input(s): ETH in the last 168 hours.  Other results: EKG: sinus rhythm at 52 bpm.  Imaging: Ct Head Wo Contrast  03/30/2014  CLINICAL DATA:  Syncopal episode, history of CABG, history of hypertension, history of right bundle-branch block  EXAM: CT HEAD WITHOUT CONTRAST  TECHNIQUE: Contiguous axial images were obtained from the base of the skull through the vertex without intravenous contrast.  COMPARISON:  None.  FINDINGS: There is no evidence of mass effect, midline shift, or extra-axial fluid collections. There is no evidence of a space-occupying lesion or intracranial hemorrhage. There is no evidence of a cortical-based area of acute infarction. There is generalized cerebral atrophy. There is mild cerebellar atrophy.  There is periventricular white matter low attenuation likely secondary to microangiopathy.  The ventricles and sulci are appropriate for the patient's age. The basal cisterns are patent.  Visualized portions of the orbits are unremarkable. The visualized portions of the paranasal sinuses and mastoid air cells are unremarkable. Cerebrovascular atherosclerotic calcifications are noted.  The osseous structures are unremarkable.  IMPRESSION: 1. No acute intracranial pathology. 2. Generalized cerebral atrophy and chronic microvascular disease.   Electronically Signed   By: Kathreen Devoid   On: 03/30/2014 11:04     Assessment/Plan: 80 year old male with acute onset of weakness that although occurred initially affecting all limbs, resolved in a focal manner.  Etiology unclear.  Patient did not lose consciousness.  Head CT personally reviewed and shows no acute changes.  Will investigate the possibility of a basilar abnormality or a showering of small emboli.  Can not rule out a cardiac etiology as well.   Patient on ASA at home.  Recommendations: 1.  CTA of the head and neck 2.  Echocardiogram 3.  Neuro checks 4.  Telemetry monitoring 5.  MRI of the brain without contrast 6.  Continue ASA daily 7.  BP control  Case discussed with Dr. Donavan Burnet, MD [facility] [phone] 03/30/2014, 12:35 PM

## 2014-03-30 NOTE — ED Notes (Signed)
Neuro at bedside.

## 2014-03-30 NOTE — ED Notes (Signed)
Patient transported to CT 

## 2014-03-30 NOTE — ED Provider Notes (Signed)
CSN: [id number]     Arrival date & time 03/30/14  7096 History   First MD Initiated Contact with Patient 03/30/14 1000     Chief Complaint  Patient presents with  . Numbness      HPI  Expand All Collapse All   Pt arrived by [facility]. Pt reports getting up this am and went to restroom, while he was urinating in restroom he had episode of right leg weakness/numbness and pt fell. The numbness has resolved pta. Pt hypertensive pta.  Patient feels most like to baseline except for worsening tremor.        Past Medical History  Diagnosis Date  . Right bundle branch block 12/20/2013  . Hyperlipidemia 12/20/2013  . Essential hypertension 12/20/2013  . Coronary atherosclerosis of artery bypass graft 12/20/2013    [facility] with CABG 2000 with LIMA to LAD, SV graft to diagonal, SVG to OM, and saphenous vein graft to distal RCA.      History reviewed. No pertinent past surgical history. Family History  Problem Relation Age of Onset  . Hypertension Mother    History  Substance Use Topics  . Smoking status: Never Smoker   . Smokeless tobacco: Not on file  . Alcohol Use: No    Review of Systems  All other systems reviewed and are negative  Allergies  Monosodium glutamate  Home Medications   Prior to Admission medications   Medication Sig Start Date End Date Taking? Authorizing Provider  amLODipine (NORVASC) 5 MG tablet Take 1 tablet (5 mg total) by mouth daily. 05/24/13  Yes Belva Crome III, MD  aspirin EC 81 MG tablet Take 1 tablet (81 mg total) by mouth daily. 04/17/13  Yes Belva Crome III, MD  atenolol (TENORMIN) 25 MG tablet Take 1 tablet (25 mg total) by mouth 2 (two) times daily. Patient taking differently: Take 25 mg by mouth daily.  04/17/13  Yes Belva Crome III, MD  atorvastatin (LIPITOR) 40 MG tablet Take 40 mg by mouth daily.   Yes Historical Provider, MD  finasteride (PROSCAR) 5 MG tablet Take 0.5 tablets (2.5 mg total) by mouth daily. 04/17/13  Yes Belva Crome III, MD   hydrochlorothiazide (MICROZIDE) 12.5 MG capsule Take 1 capsule (12.5 mg total) by mouth daily. 04/17/13  Yes Belva Crome III, MD  lisinopril (PRINIVIL,ZESTRIL) 40 MG tablet Take 1 tablet (40 mg total) by mouth daily. 04/17/13  Yes Belva Crome III, MD  metFORMIN (GLUCOPHAGE) 500 MG tablet Take 0.5 tablets (250 mg total) by mouth daily with breakfast. 04/17/13  Yes Sinclair Grooms, MD  Multiple Vitamins-Minerals (PRESERVISION AREDS 2) CAPS Take 1 capsule by mouth daily. Patient taking differently: Take 1 capsule by mouth 2 (two) times daily.  04/17/13  Yes Belva Crome III, MD  omeprazole (PRILOSEC) 20 MG capsule Take 1 capsule (20 mg total) by mouth daily. 04/17/13  Yes Belva Crome III, MD   BP 156/56 mmHg  Pulse 56  Temp(Src) 98 F (36.7 C) (Oral)  Resp 18  Ht 5\' 11"  (1.803 m)  Wt 190 lb (86.183 kg)  BMI 26.51 kg/m2  SpO2 98% Physical Exam  Constitutional: He is oriented to person, place, and time. He appears well-developed and well-nourished. No distress.  HENT:  Head: Normocephalic and atraumatic.  Eyes: Pupils are equal, round, and reactive to light.  Neck: Normal range of motion.  Cardiovascular: Normal rate and intact distal pulses.   Pulmonary/Chest: No respiratory distress.  Abdominal: Normal  appearance. He exhibits no distension.  Musculoskeletal: Normal range of motion.  Neurological: He is alert and oriented to person, place, and time. He displays tremor. No cranial nerve deficit. Gait normal. GCS eye subscore is 4. GCS verbal subscore is 5. GCS motor subscore is 6.  Skin: Skin is warm and dry. No rash noted.  Psychiatric: He has a normal mood and affect. His behavior is normal.  Nursing note and vitals reviewed.   ED Course  Procedures (including critical care time) Labs Review Labs Reviewed  COMPREHENSIVE METABOLIC PANEL - Abnormal; Notable for the following:    Glucose, Bld 108 (*)    GFR calc non Af Amer 71 (*)    GFR calc Af Amer 83 (*)    All other components  within normal limits  LIPID PANEL - Abnormal; Notable for the following:    Triglycerides 165 (*)    HDL 32 (*)    All other components within normal limits  CBC WITH DIFFERENTIAL  TROPONIN I  HEMOGLOBIN A1C    Imaging Review Ct Angio Head W/cm &/or Wo Cm  03/30/2014   CLINICAL DATA:  80 year old male with syncope and collapse. Initial encounter.  EXAM: CT ANGIOGRAPHY HEAD AND NECK  TECHNIQUE: Multidetector CT imaging of the head and neck was performed using the standard protocol during bolus administration of intravenous contrast. Multiplanar CT image reconstructions and MIPs were obtained to evaluate the vascular anatomy. Carotid stenosis measurements (when applicable) are obtained utilizing NASCET criteria, using the distal internal carotid diameter as the denominator.  CONTRAST:  6mL OMNIPAQUE IOHEXOL 350 MG/ML SOLN  COMPARISON:  Head CT without contrast 1056 hr the same day.  FINDINGS: CTA HEAD FINDINGS  No abnormal enhancement identified. No evidence of cortically based acute infarction identified. No midline shift, mass effect, or evidence of intracranial mass lesion.  VASCULAR FINDINGS:  Major intracranial venous structures are enhancing.  Calcified plaque and irregularity of both distal vertebral arteries, moderate stenosis on the right as the vessel enters the skull. Mild stenosis on the left. The right PICA origin remains patent. Vertebrobasilar junction is patent. Dominant appearing left AICA. No basilar artery stenosis. SCA origins are patent. Mildly irregular left PCA origin. Moderately irregular left P1 and P2 segments, with mild to moderate stenosis. Fetal type right PCA origin. Right PCA branches are within normal limits.  Extensive Calcified plaque in both ICA siphons.  High-grade stenosis at the right ICA anterior genu (series 408, image 479 and sagittal image 59). This is just proximal to the right ophthalmic artery origin which remains patent. Supra clinoid calcified plaque occurs  with up to 50% stenosis. The right ICA terminus is patent.  Calcified plaque in the left ICA siphon with moderate stenosis in the anterior genu and proximal supra clinoid segment. Left ICA terminus is patent.  MCA origins are within normal limits. The left ACA A1 segment is dominant. Normal anterior communicating artery. Visualized bilateral ACA branches are within normal limits.  Mildly to moderately irregular right MCA M1 segment, mild M1 stenosis. Right MCA branches are within normal limits. Mildly irregular left MCA M1 segment. Left MCA branches are within normal limits.  Review of the MIP images confirms the above findings.  CTA NECK FINDINGS  Negative lung apices. No superior mediastinal lymphadenopathy. Sequelae of median sternotomy. Thyroid is within normal limits for age. Evaluation of the larynx and pharynx degraded by motion. Parapharyngeal spaces, retropharyngeal space, sublingual space, submandibular glands, parotid glands, and orbits soft tissues are within normal limits. Trace  paranasal sinus mucosal thickening. Degenerative changes in the spine. No acute osseous abnormality identified. No cervical lymphadenopathy.  VASCULAR FINDINGS:  Soft and calcified plaque affecting the aortic arch. Three vessel arch configuration. No great vessel origin stenosis.  No right CCA origin stenosis. Soft and calcified plaque at the right carotid bifurcation with less than 50% stenosis. Tortuous cervical right ICA just beyond the bulb with a kinked appearance (sagittal image 33). Additional tortuosity just proximal to the skullbase with a mildly kinked appearance (coronal image 94).  No proximal right subclavian artery stenosis. Calcified plaque at the right vertebral artery origin, but no hemodynamically significant stenosis results. Mild irregularity in the V2 segment, with soft and occasionally calcified plaque. No V2 segment stenosis. Just proximal to the skullbase there is more moderate irregularity of the vessel  with calcified plaque. See the intracranial findings.  No left CCA origin stenosis. Mild motion artifact at the left carotid bifurcation. Soft and calcified plaque resulting in less than 50 % stenosis with respect to the distal vessel. Tortuous left cervical ICA distal to the bulb with additional soft and calcified plaque just below the skullbase, but no stenosis occurs. Rather the vessel appears mildly dolichoectatic.  No proximal left subclavian artery stenosis. Soft and calcified plaque at the left vertebral artery origin resulting in moderate stenosis (coronal image 126). There is also moderate stenosis of the vessel in the distal V2 segment which appears related to soft plaque (coronal image 106). The left vertebral artery remains patent to the skullbase.  Review of the MIP images confirms the above findings.  IMPRESSION: 1. Extracranial carotid arteries with no significant atherosclerotic stenosis, but tortuosity and a kinked appearance more so on the right. 2. Moderate to severe bilateral ICA siphon atherosclerotic stenosis related to bulky calcified plaque. High-grade stenosis of the right ICA anterior genu. 3. Otherwise mild anterior circulation atherosclerosis, mild right MCA M1 stenosis. 4. Overall moderate bilateral vertebral artery atherosclerosis in stenosis; moderate distal right vertebral artery and left vertebral artery origin stenoses. 5. Otherwise posterior circulation remarkable for mild to moderate left PCA atherosclerosis and stenosis. 6. Stable and negative CT appearance of the brain.   Electronically Signed   By: Lars Pinks M.D.   On: 03/30/2014 15:03   Dg Chest 2 View  03/30/2014   CLINICAL DATA:  Patient fell earlier in the day  EXAM: CHEST  2 VIEW  COMPARISON:  September 10, 2004  FINDINGS: There is underlying emphysematous change. There is slight scarring in the left base. There is no edema or consolidation. Heart is borderline enlarged. Pulmonary vascularity is within normal limits. Patient  is status post coronary artery bypass grafting. Slight prominence in the superior mediastinum is stable and felt to represent great vessel prominence in this age group. There is no adenopathy. No pneumothorax. No appreciable bone lesions.  IMPRESSION: Underlying emphysematous change. No edema or consolidation. No pneumothorax. Heart borderline enlarged.   Electronically Signed   By: Lowella Grip M.D.   On: 03/30/2014 11:28   Ct Head Wo Contrast  03/30/2014   CLINICAL DATA:  Syncopal episode, history of CABG, history of hypertension, history of right bundle-branch block  EXAM: CT HEAD WITHOUT CONTRAST  TECHNIQUE: Contiguous axial images were obtained from the base of the skull through the vertex without intravenous contrast.  COMPARISON:  None.  FINDINGS: There is no evidence of mass effect, midline shift, or extra-axial fluid collections. There is no evidence of a space-occupying lesion or intracranial hemorrhage. There is no evidence of  a cortical-based area of acute infarction. There is generalized cerebral atrophy. There is mild cerebellar atrophy. There is periventricular white matter low attenuation likely secondary to microangiopathy.  The ventricles and sulci are appropriate for the patient's age. The basal cisterns are patent.  Visualized portions of the orbits are unremarkable. The visualized portions of the paranasal sinuses and mastoid air cells are unremarkable. Cerebrovascular atherosclerotic calcifications are noted.  The osseous structures are unremarkable.  IMPRESSION: 1. No acute intracranial pathology. 2. Generalized cerebral atrophy and chronic microvascular disease.   Electronically Signed   By: Kathreen Devoid   On: 03/30/2014 11:04   Ct Angio Neck W/cm &/or Wo/cm  03/30/2014   CLINICAL DATA:  81 year old male with syncope and collapse. Initial encounter.  EXAM: CT ANGIOGRAPHY HEAD AND NECK  TECHNIQUE: Multidetector CT imaging of the head and neck was performed using the standard  protocol during bolus administration of intravenous contrast. Multiplanar CT image reconstructions and MIPs were obtained to evaluate the vascular anatomy. Carotid stenosis measurements (when applicable) are obtained utilizing NASCET criteria, using the distal internal carotid diameter as the denominator.  CONTRAST:  18mL OMNIPAQUE IOHEXOL 350 MG/ML SOLN  COMPARISON:  Head CT without contrast 1056 hr the same day.  FINDINGS: CTA HEAD FINDINGS  No abnormal enhancement identified. No evidence of cortically based acute infarction identified. No midline shift, mass effect, or evidence of intracranial mass lesion.  VASCULAR FINDINGS:  Major intracranial venous structures are enhancing.  Calcified plaque and irregularity of both distal vertebral arteries, moderate stenosis on the right as the vessel enters the skull. Mild stenosis on the left. The right PICA origin remains patent. Vertebrobasilar junction is patent. Dominant appearing left AICA. No basilar artery stenosis. SCA origins are patent. Mildly irregular left PCA origin. Moderately irregular left P1 and P2 segments, with mild to moderate stenosis. Fetal type right PCA origin. Right PCA branches are within normal limits.  Extensive Calcified plaque in both ICA siphons.  High-grade stenosis at the right ICA anterior genu (series 408, image 479 and sagittal image 59). This is just proximal to the right ophthalmic artery origin which remains patent. Supra clinoid calcified plaque occurs with up to 50% stenosis. The right ICA terminus is patent.  Calcified plaque in the left ICA siphon with moderate stenosis in the anterior genu and proximal supra clinoid segment. Left ICA terminus is patent.  MCA origins are within normal limits. The left ACA A1 segment is dominant. Normal anterior communicating artery. Visualized bilateral ACA branches are within normal limits.  Mildly to moderately irregular right MCA M1 segment, mild M1 stenosis. Right MCA branches are within  normal limits. Mildly irregular left MCA M1 segment. Left MCA branches are within normal limits.  Review of the MIP images confirms the above findings.  CTA NECK FINDINGS  Negative lung apices. No superior mediastinal lymphadenopathy. Sequelae of median sternotomy. Thyroid is within normal limits for age. Evaluation of the larynx and pharynx degraded by motion. Parapharyngeal spaces, retropharyngeal space, sublingual space, submandibular glands, parotid glands, and orbits soft tissues are within normal limits. Trace paranasal sinus mucosal thickening. Degenerative changes in the spine. No acute osseous abnormality identified. No cervical lymphadenopathy.  VASCULAR FINDINGS:  Soft and calcified plaque affecting the aortic arch. Three vessel arch configuration. No great vessel origin stenosis.  No right CCA origin stenosis. Soft and calcified plaque at the right carotid bifurcation with less than 50% stenosis. Tortuous cervical right ICA just beyond the bulb with a kinked appearance (sagittal image 33). Additional  tortuosity just proximal to the skullbase with a mildly kinked appearance (coronal image 94).  No proximal right subclavian artery stenosis. Calcified plaque at the right vertebral artery origin, but no hemodynamically significant stenosis results. Mild irregularity in the V2 segment, with soft and occasionally calcified plaque. No V2 segment stenosis. Just proximal to the skullbase there is more moderate irregularity of the vessel with calcified plaque. See the intracranial findings.  No left CCA origin stenosis. Mild motion artifact at the left carotid bifurcation. Soft and calcified plaque resulting in less than 50 % stenosis with respect to the distal vessel. Tortuous left cervical ICA distal to the bulb with additional soft and calcified plaque just below the skullbase, but no stenosis occurs. Rather the vessel appears mildly dolichoectatic.  No proximal left subclavian artery stenosis. Soft and  calcified plaque at the left vertebral artery origin resulting in moderate stenosis (coronal image 126). There is also moderate stenosis of the vessel in the distal V2 segment which appears related to soft plaque (coronal image 106). The left vertebral artery remains patent to the skullbase.  Review of the MIP images confirms the above findings.  IMPRESSION: 1. Extracranial carotid arteries with no significant atherosclerotic stenosis, but tortuosity and a kinked appearance more so on the right. 2. Moderate to severe bilateral ICA siphon atherosclerotic stenosis related to bulky calcified plaque. High-grade stenosis of the right ICA anterior genu. 3. Otherwise mild anterior circulation atherosclerosis, mild right MCA M1 stenosis. 4. Overall moderate bilateral vertebral artery atherosclerosis in stenosis; moderate distal right vertebral artery and left vertebral artery origin stenoses. 5. Otherwise posterior circulation remarkable for mild to moderate left PCA atherosclerosis and stenosis. 6. Stable and negative CT appearance of the brain.   Electronically Signed   By: Lars Pinks M.D.   On: 03/30/2014 15:03   Mr Brain Wo Contrast  03/30/2014   CLINICAL DATA:  80 year old male with weakness, fall in bathroom. No loss of consciousness. Initial encounter.  EXAM: MRI HEAD WITHOUT CONTRAST  TECHNIQUE: Multiplanar, multiecho pulse sequences of the brain and surrounding structures were obtained without intravenous contrast.  COMPARISON:  CTA head and neck 1421 hr the same day and earlier.  FINDINGS: Diffusion weighted imaging reveal several small cortically based areas of restricted diffusion in the superior left hemisphere. These include the medial left frontal lobe pre motor area (should be left ACA territory, series 4, image 34) as well as the posterior left frontal lobe closer to the motor strip (series 4, image 32).  No contralateral or posterior fossa restricted diffusion. Major intracranial vascular flow voids are  preserved.  No midline shift, mass effect, evidence of mass lesion, ventriculomegaly, extra-axial collection or acute intracranial hemorrhage. Cervicomedullary junction and pituitary are within normal limits. Negative visualized cervical spine.  There are scattered chronic micro hemorrhages in the brain, including in the cerebellum. Superimposed minimal to mild for age scattered nonspecific cerebral white matter T2 and FLAIR hyperintensity. No cortical encephalomalacia.  Visible internal auditory structures appear normal. Mastoids are clear. Trace paranasal sinus mucosal thickening. Postoperative changes to the globes. Visualized scalp soft tissues are within normal limits. Normal bone marrow signal.  IMPRESSION: 1. Several tiny acute cortical infarcts in the superior left frontal gyrus, left MCA and left ACA territories. No mass effect or hemorrhage. 2. Scattered chronic micro hemorrhages in the brain compatible with chronic small vessel disease.   Electronically Signed   By: Lars Pinks M.D.   On: 03/30/2014 17:07     EKG Interpretation  Date/Time:  Saturday March 30 2014 10:26:19 EST Ventricular Rate:  52 PR Interval:  192 QRS Duration: 158 QT Interval:  490 QTC Calculation: 456 R Axis:   1 Text Interpretation:  Sinus rhythm Right bundle branch block No  significant change since last tracing Confirmed by Thedford Bunton  MD, Izaiah Tabb  ([id number]) on 03/30/2014 10:36:27 AM     Patient was seen and evaluated in the emergency room by neurology.  I recommended admission to the hospitalist service for further evaluation workup MDM   Final diagnoses:  Syncope        Dot Lanes, MD 03/31/14 [phone]

## 2014-03-31 DIAGNOSIS — I369 Nonrheumatic tricuspid valve disorder, unspecified: Secondary | ICD-10-CM

## 2014-03-31 DIAGNOSIS — I63512 Cerebral infarction due to unspecified occlusion or stenosis of left middle cerebral artery: Secondary | ICD-10-CM

## 2014-03-31 LAB — LIPID PANEL
Cholesterol: 147 mg/dL (ref 0–200)
HDL: 32 mg/dL — ABNORMAL LOW (ref >39)
LDL Cholesterol: 82 mg/dL (ref 0–99)
Total CHOL/HDL Ratio: 4.6 RATIO
Triglycerides: 165 mg/dL — ABNORMAL HIGH (ref <150)
VLDL: 33 mg/dL (ref 0–40)

## 2014-03-31 LAB — HEMOGLOBIN A1C
HEMOGLOBIN A1C: 6.1 % — AB (ref <5.7)
MEAN PLASMA GLUCOSE: 128 mg/dL — AB (ref <117)

## 2014-03-31 MED ORDER — ATORVASTATIN CALCIUM 80 MG PO TABS
80.0000 mg | ORAL_TABLET | Freq: Every day | ORAL | Status: DC
  Administered 2014-03-31 – 2014-04-01 (×2): 80 mg via ORAL
  Filled 2014-03-31 (×2): qty 1

## 2014-03-31 NOTE — Progress Notes (Signed)
Utilization Review Completed.Tyjanae Bartek T12/20/2015  

## 2014-03-31 NOTE — Progress Notes (Signed)
STROKE TEAM PROGRESS NOTE   HISTORY [name] is an 80 y.o. male who reports that he awakened this morning as usual. He went to urinate and afterward became weak all over falling to the floor. He did not lose consciousness. He remained weak while on the floor but slowly had improvement in his strength with the left side returning some strength prior to the left and the RLE being the last to recover some strength. He was eventually able to pull himself up to the toilet and to call his wife. No speech abnormalities were noted. He finally got some help to get to the bed and EMS was called. Patient Was brought in for evaluation. Reports no significant weakness at this time.    SUBJECTIVE (INTERVAL HISTORY) The patient's wife and daughter are both in the room. The patient feels that he is back to baseline. Discussed patient's carotid stenosis. Explained that this will need to be addressed at some point in the future.   OBJECTIVE Temp:  [97.6 F (36.4 C)-98.2 F (36.8 C)] 98 F (36.7 C) (12/20 0620) Pulse Rate:  [46-58] 56 (12/20 0620) Cardiac Rhythm:  [-] Sinus bradycardia (12/20 0620) Resp:  [13-20] 18 (12/20 0620) BP: (121-201)/(48-110) 156/56 mmHg (12/20 0620) SpO2:  [95 %-98 %] 98 % (12/20 0620) Weight:  [190 lb (86.183 kg)] 190 lb (86.183 kg) (12/19 1821)  No results for input(s): GLUCAP in the last 168 hours.  Recent Labs Lab 03/30/14 1026  NA 142  K 4.1  CL 106  CO2 24  GLUCOSE 108*  BUN 23  CREATININE 0.96  CALCIUM 9.0    Recent Labs Lab 03/30/14 1026  AST 15  ALT 11  ALKPHOS 78  BILITOT 0.6  PROT 6.5  ALBUMIN 3.8    Recent Labs Lab 03/30/14 1026  WBC 6.2  NEUTROABS 4.7  HGB 13.4  HCT 39.6  MCV 87.2  PLT 207    Recent Labs Lab 03/30/14 1026  TROPONINI <0.30   No results for input(s): LABPROT, INR in the last 72 hours. No results for input(s): COLORURINE, LABSPEC, Amo, GLUCOSEU, HGBUR, BILIRUBINUR, KETONESUR, PROTEINUR,  UROBILINOGEN, NITRITE, LEUKOCYTESUR in the last 72 hours.  Invalid input(s): APPERANCEUR     Component Value Date/Time   CHOL 147 03/31/2014 0338   TRIG 165* 03/31/2014 0338   HDL 32* 03/31/2014 0338   CHOLHDL 4.6 03/31/2014 0338   VLDL 33 03/31/2014 0338   LDLCALC 82 03/31/2014 0338   No results found for: HGBA1C No results found for: LABOPIA, COCAINSCRNUR, LABBENZ, AMPHETMU, THCU, LABBARB  No results for input(s): ETH in the last 168 hours.  Ct Angio Head and Neck W/cm &/or Wo Cm 03/30/2014    1. Extracranial carotid arteries with no significant atherosclerotic stenosis, but tortuosity and a kinked appearance more so on the right.  2. Moderate to severe bilateral ICA siphon atherosclerotic stenosis related to bulky calcified plaque. High-grade stenosis of the right ICA anterior genu.  3. Otherwise mild anterior circulation atherosclerosis, mild right MCA M1 stenosis.  4. Overall moderate bilateral vertebral artery atherosclerosis in stenosis; moderate distal right vertebral artery and left vertebral artery origin stenoses.  5. Otherwise posterior circulation remarkable for mild to moderate left PCA atherosclerosis and stenosis.  6. Stable and negative CT appearance of the brain.     Dg Chest 2 View 03/30/2014    Underlying emphysematous change. No edema or consolidation. No pneumothorax. Heart borderline enlarged.     Ct Head Wo Contrast 03/30/2014    1.  No acute intracranial pathology.  2. Generalized cerebral atrophy and chronic microvascular disease.        Mr Brain Wo Contrast 03/30/2014    1. Several tiny acute cortical infarcts in the superior left frontal gyrus, left MCA and left ACA territories. No mass effect or hemorrhage.  2. Scattered chronic micro hemorrhages in the brain compatible with chronic small vessel disease.       PHYSICAL EXAM Mental Status: Alert, oriented, thought content appropriate. Speech fluent without evidence of aphasia. Able to follow 3  step commands without difficulty. Cranial Nerves: II: Discs flat bilaterally; Visual fields grossly normal, pupils equal, round, reactive to light and accommodation III,IV, VI: ptosis not present, extra-ocular motions intact bilaterally V,VII: smile symmetric, facial light touch sensation normal bilaterally VIII: hearing normal bilaterally IX,X: gag reflex present XI: bilateral shoulder shrug XII: midline tongue extension Motor: Strength 5/5 throughout Tone and bulk:normal tone throughout; no atrophy noted Sensory: Pinprick and light touch intact throughout, bilaterally Cerebellar: normal finger-to-nose Gait: Not tested    ASSESSMENT/PLAN [name] is a 80 y.o. male with history of hypertension, hyperlipidemia, coronary artery disease with coronary artery bypass graft surgery in 2000 presenting with generalized weakness after falling. He did not receive IV t-PA  secondary to minimal deficits.  Strokes:  Dominant infarcts - possibly embolic secondary to an unknown source.  Resultant - resolution of deficits  MRI  - as above  MRA  not ordered  CTA head and neck - moderate to severe bilateral internal carotid artery disease  Carotid Doppler - See CTA of neck  2D Echo - pending  LDL - 82  HgbA1c pending  Subcutaneous heparin for VTE prophylaxis  Diet Heart with thin liquids  aspirin 81 mg orally every day prior to admission, now on aspirin 325 mg orally every day  Patient counseled to be compliant with his antithrombotic medications  Ongoing aggressive stroke risk factor management  Therapy recommendations:  No follow-up PT recommended  Disposition: Pending  Hypertension  Home meds:   Norvasc 5 mg daily, Tenormin 25 mg twice daily, Microzide 12.5 mg daily, and lisinopril 40 mg daily  Stable  Patient counseled to be compliant with his blood pressure medications  Hyperlipidemia  Home meds:  Lipitor 40 mg daily - resumed in hospital  LDL 82, goal <  70  Increase Lipitor to 80 mg daily   Continue statin at discharge   Other Stroke Risk Factors  Advanced age  Coronary artery disease  High-grade stenosis right internal carotid artery    Other Active Problems  High-grade stenosis right internal carotid artery  Other Pertinent History     Hospital day # 1  Mikey Bussing PA-C [facility] Pager [phone] 03/31/2014, 9:55 AM   Awaiting carotid US. Likely high grade stenosis. Would follow up with vascular as out pt. This case was d/w family at bedside. Leotis Pain   To contact Stroke Continuity provider, please refer to [email or web address] After hours, contact General Neurology

## 2014-03-31 NOTE — Progress Notes (Signed)
[facility] PROGRESS NOTE  [name] NWG:[id number] DOB: Jul 24, 1922 DOA: 03/30/2014 PCP: Jani Gravel, MD  Assessment/Plan: 1. Acute CVA 1. Involving L frontal gyrus, L MCA, L ACA territories 2. Neurology following 3. 2d echo pending 4. CT neck with mod-severe B ICA disease 5. Continue on ASA and statin 2. HLD 1. On statin per above 3. HTN 1. BP stable 2. Allowing permissive htn 4. Hx CAD s/p CABG 1. Currently asymptomatic and chest pain free 5. DVT prophylaxis 1. Heparin subQ  Code Status: Full Family Communication: Pt in room, family at bedside (indicate person spoken with, relationship, and if by phone, the number) Disposition Plan: pending   Consultants:  Neurology  Procedures:    Antibiotics:   (indicate start date, and stop date if known)  HPI/Subjective: No acute events noted overnight. Pt is eager to go home  Objective: Filed Vitals:   03/31/14 0230 03/31/14 0430 03/31/14 0620 03/31/14 1011  BP: 121/59 135/54 156/56 153/52  Pulse: 56 55 56 50  Temp: 98 F (36.7 C) 98 F (36.7 C) 98 F (36.7 C) 98.1 F (36.7 C)  TempSrc: Oral Oral Oral Oral  Resp: 16 18 18 20   Height:      Weight:      SpO2: 97% 98% 98% 98%   No intake or output data in the 24 hours ending 03/31/14 1339 Filed Weights   03/30/14 1821  Weight: 86.183 kg (190 lb)    Exam:   General:  Awake, in nad  Cardiovascular: regular, s1, s2  Respiratory: normal resp effort, no wheezing  Abdomen: soft, nondistended  Musculoskeletal: perfused, no clubbing   Data Reviewed: Basic Metabolic Panel:  Recent Labs Lab 03/30/14 1026  NA 142  K 4.1  CL 106  CO2 24  GLUCOSE 108*  BUN 23  CREATININE 0.96  CALCIUM 9.0   Liver Function Tests:  Recent Labs Lab 03/30/14 1026  AST 15  ALT 11  ALKPHOS 78  BILITOT 0.6  PROT 6.5  ALBUMIN 3.8   No results for input(s): LIPASE, AMYLASE in the last 168 hours. No results for input(s): AMMONIA in the last 168  hours. CBC:  Recent Labs Lab 03/30/14 1026  WBC 6.2  NEUTROABS 4.7  HGB 13.4  HCT 39.6  MCV 87.2  PLT 207   Cardiac Enzymes:  Recent Labs Lab 03/30/14 1026  TROPONINI <0.30   BNP (last 3 results) No results for input(s): PROBNP in the last 8760 hours. CBG: No results for input(s): GLUCAP in the last 168 hours.  No results found for this or any previous visit (from the past 240 hour(s)).   Studies: Ct Angio Head W/cm &/or Wo Cm  03/30/2014   CLINICAL DATA:  80 year old male with syncope and collapse. Initial encounter.  EXAM: CT ANGIOGRAPHY HEAD AND NECK  TECHNIQUE: Multidetector CT imaging of the head and neck was performed using the standard protocol during bolus administration of intravenous contrast. Multiplanar CT image reconstructions and MIPs were obtained to evaluate the vascular anatomy. Carotid stenosis measurements (when applicable) are obtained utilizing NASCET criteria, using the distal internal carotid diameter as the denominator.  CONTRAST:  34mL OMNIPAQUE IOHEXOL 350 MG/ML SOLN  COMPARISON:  Head CT without contrast 1056 hr the same day.  FINDINGS: CTA HEAD FINDINGS  No abnormal enhancement identified. No evidence of cortically based acute infarction identified. No midline shift, mass effect, or evidence of intracranial mass lesion.  VASCULAR FINDINGS:  Major intracranial venous structures are enhancing.  Calcified plaque and irregularity  of both distal vertebral arteries, moderate stenosis on the right as the vessel enters the skull. Mild stenosis on the left. The right PICA origin remains patent. Vertebrobasilar junction is patent. Dominant appearing left AICA. No basilar artery stenosis. SCA origins are patent. Mildly irregular left PCA origin. Moderately irregular left P1 and P2 segments, with mild to moderate stenosis. Fetal type right PCA origin. Right PCA branches are within normal limits.  Extensive Calcified plaque in both ICA siphons.  High-grade stenosis at the  right ICA anterior genu (series 408, image 479 and sagittal image 59). This is just proximal to the right ophthalmic artery origin which remains patent. Supra clinoid calcified plaque occurs with up to 50% stenosis. The right ICA terminus is patent.  Calcified plaque in the left ICA siphon with moderate stenosis in the anterior genu and proximal supra clinoid segment. Left ICA terminus is patent.  MCA origins are within normal limits. The left ACA A1 segment is dominant. Normal anterior communicating artery. Visualized bilateral ACA branches are within normal limits.  Mildly to moderately irregular right MCA M1 segment, mild M1 stenosis. Right MCA branches are within normal limits. Mildly irregular left MCA M1 segment. Left MCA branches are within normal limits.  Review of the MIP images confirms the above findings.  CTA NECK FINDINGS  Negative lung apices. No superior mediastinal lymphadenopathy. Sequelae of median sternotomy. Thyroid is within normal limits for age. Evaluation of the larynx and pharynx degraded by motion. Parapharyngeal spaces, retropharyngeal space, sublingual space, submandibular glands, parotid glands, and orbits soft tissues are within normal limits. Trace paranasal sinus mucosal thickening. Degenerative changes in the spine. No acute osseous abnormality identified. No cervical lymphadenopathy.  VASCULAR FINDINGS:  Soft and calcified plaque affecting the aortic arch. Three vessel arch configuration. No great vessel origin stenosis.  No right CCA origin stenosis. Soft and calcified plaque at the right carotid bifurcation with less than 50% stenosis. Tortuous cervical right ICA just beyond the bulb with a kinked appearance (sagittal image 33). Additional tortuosity just proximal to the skullbase with a mildly kinked appearance (coronal image 94).  No proximal right subclavian artery stenosis. Calcified plaque at the right vertebral artery origin, but no hemodynamically significant stenosis  results. Mild irregularity in the V2 segment, with soft and occasionally calcified plaque. No V2 segment stenosis. Just proximal to the skullbase there is more moderate irregularity of the vessel with calcified plaque. See the intracranial findings.  No left CCA origin stenosis. Mild motion artifact at the left carotid bifurcation. Soft and calcified plaque resulting in less than 50 % stenosis with respect to the distal vessel. Tortuous left cervical ICA distal to the bulb with additional soft and calcified plaque just below the skullbase, but no stenosis occurs. Rather the vessel appears mildly dolichoectatic.  No proximal left subclavian artery stenosis. Soft and calcified plaque at the left vertebral artery origin resulting in moderate stenosis (coronal image 126). There is also moderate stenosis of the vessel in the distal V2 segment which appears related to soft plaque (coronal image 106). The left vertebral artery remains patent to the skullbase.  Review of the MIP images confirms the above findings.  IMPRESSION: 1. Extracranial carotid arteries with no significant atherosclerotic stenosis, but tortuosity and a kinked appearance more so on the right. 2. Moderate to severe bilateral ICA siphon atherosclerotic stenosis related to bulky calcified plaque. High-grade stenosis of the right ICA anterior genu. 3. Otherwise mild anterior circulation atherosclerosis, mild right MCA M1 stenosis. 4. Overall moderate bilateral  vertebral artery atherosclerosis in stenosis; moderate distal right vertebral artery and left vertebral artery origin stenoses. 5. Otherwise posterior circulation remarkable for mild to moderate left PCA atherosclerosis and stenosis. 6. Stable and negative CT appearance of the brain.   Electronically Signed   By: Lars Pinks M.D.   On: 03/30/2014 15:03   Dg Chest 2 View  03/30/2014   CLINICAL DATA:  Patient fell earlier in the day  EXAM: CHEST  2 VIEW  COMPARISON:  September 10, 2004  FINDINGS: There is  underlying emphysematous change. There is slight scarring in the left base. There is no edema or consolidation. Heart is borderline enlarged. Pulmonary vascularity is within normal limits. Patient is status post coronary artery bypass grafting. Slight prominence in the superior mediastinum is stable and felt to represent great vessel prominence in this age group. There is no adenopathy. No pneumothorax. No appreciable bone lesions.  IMPRESSION: Underlying emphysematous change. No edema or consolidation. No pneumothorax. Heart borderline enlarged.   Electronically Signed   By: Lowella Grip M.D.   On: 03/30/2014 11:28   Ct Head Wo Contrast  03/30/2014   CLINICAL DATA:  Syncopal episode, history of CABG, history of hypertension, history of right bundle-branch block  EXAM: CT HEAD WITHOUT CONTRAST  TECHNIQUE: Contiguous axial images were obtained from the base of the skull through the vertex without intravenous contrast.  COMPARISON:  None.  FINDINGS: There is no evidence of mass effect, midline shift, or extra-axial fluid collections. There is no evidence of a space-occupying lesion or intracranial hemorrhage. There is no evidence of a cortical-based area of acute infarction. There is generalized cerebral atrophy. There is mild cerebellar atrophy. There is periventricular white matter low attenuation likely secondary to microangiopathy.  The ventricles and sulci are appropriate for the patient's age. The basal cisterns are patent.  Visualized portions of the orbits are unremarkable. The visualized portions of the paranasal sinuses and mastoid air cells are unremarkable. Cerebrovascular atherosclerotic calcifications are noted.  The osseous structures are unremarkable.  IMPRESSION: 1. No acute intracranial pathology. 2. Generalized cerebral atrophy and chronic microvascular disease.   Electronically Signed   By: Kathreen Devoid   On: 03/30/2014 11:04   Ct Angio Neck W/cm &/or Wo/cm  03/30/2014   CLINICAL DATA:   80 year old male with syncope and collapse. Initial encounter.  EXAM: CT ANGIOGRAPHY HEAD AND NECK  TECHNIQUE: Multidetector CT imaging of the head and neck was performed using the standard protocol during bolus administration of intravenous contrast. Multiplanar CT image reconstructions and MIPs were obtained to evaluate the vascular anatomy. Carotid stenosis measurements (when applicable) are obtained utilizing NASCET criteria, using the distal internal carotid diameter as the denominator.  CONTRAST:  55mL OMNIPAQUE IOHEXOL 350 MG/ML SOLN  COMPARISON:  Head CT without contrast 1056 hr the same day.  FINDINGS: CTA HEAD FINDINGS  No abnormal enhancement identified. No evidence of cortically based acute infarction identified. No midline shift, mass effect, or evidence of intracranial mass lesion.  VASCULAR FINDINGS:  Major intracranial venous structures are enhancing.  Calcified plaque and irregularity of both distal vertebral arteries, moderate stenosis on the right as the vessel enters the skull. Mild stenosis on the left. The right PICA origin remains patent. Vertebrobasilar junction is patent. Dominant appearing left AICA. No basilar artery stenosis. SCA origins are patent. Mildly irregular left PCA origin. Moderately irregular left P1 and P2 segments, with mild to moderate stenosis. Fetal type right PCA origin. Right PCA branches are within normal limits.  Extensive  Calcified plaque in both ICA siphons.  High-grade stenosis at the right ICA anterior genu (series 408, image 479 and sagittal image 59). This is just proximal to the right ophthalmic artery origin which remains patent. Supra clinoid calcified plaque occurs with up to 50% stenosis. The right ICA terminus is patent.  Calcified plaque in the left ICA siphon with moderate stenosis in the anterior genu and proximal supra clinoid segment. Left ICA terminus is patent.  MCA origins are within normal limits. The left ACA A1 segment is dominant. Normal  anterior communicating artery. Visualized bilateral ACA branches are within normal limits.  Mildly to moderately irregular right MCA M1 segment, mild M1 stenosis. Right MCA branches are within normal limits. Mildly irregular left MCA M1 segment. Left MCA branches are within normal limits.  Review of the MIP images confirms the above findings.  CTA NECK FINDINGS  Negative lung apices. No superior mediastinal lymphadenopathy. Sequelae of median sternotomy. Thyroid is within normal limits for age. Evaluation of the larynx and pharynx degraded by motion. Parapharyngeal spaces, retropharyngeal space, sublingual space, submandibular glands, parotid glands, and orbits soft tissues are within normal limits. Trace paranasal sinus mucosal thickening. Degenerative changes in the spine. No acute osseous abnormality identified. No cervical lymphadenopathy.  VASCULAR FINDINGS:  Soft and calcified plaque affecting the aortic arch. Three vessel arch configuration. No great vessel origin stenosis.  No right CCA origin stenosis. Soft and calcified plaque at the right carotid bifurcation with less than 50% stenosis. Tortuous cervical right ICA just beyond the bulb with a kinked appearance (sagittal image 33). Additional tortuosity just proximal to the skullbase with a mildly kinked appearance (coronal image 94).  No proximal right subclavian artery stenosis. Calcified plaque at the right vertebral artery origin, but no hemodynamically significant stenosis results. Mild irregularity in the V2 segment, with soft and occasionally calcified plaque. No V2 segment stenosis. Just proximal to the skullbase there is more moderate irregularity of the vessel with calcified plaque. See the intracranial findings.  No left CCA origin stenosis. Mild motion artifact at the left carotid bifurcation. Soft and calcified plaque resulting in less than 50 % stenosis with respect to the distal vessel. Tortuous left cervical ICA distal to the bulb with  additional soft and calcified plaque just below the skullbase, but no stenosis occurs. Rather the vessel appears mildly dolichoectatic.  No proximal left subclavian artery stenosis. Soft and calcified plaque at the left vertebral artery origin resulting in moderate stenosis (coronal image 126). There is also moderate stenosis of the vessel in the distal V2 segment which appears related to soft plaque (coronal image 106). The left vertebral artery remains patent to the skullbase.  Review of the MIP images confirms the above findings.  IMPRESSION: 1. Extracranial carotid arteries with no significant atherosclerotic stenosis, but tortuosity and a kinked appearance more so on the right. 2. Moderate to severe bilateral ICA siphon atherosclerotic stenosis related to bulky calcified plaque. High-grade stenosis of the right ICA anterior genu. 3. Otherwise mild anterior circulation atherosclerosis, mild right MCA M1 stenosis. 4. Overall moderate bilateral vertebral artery atherosclerosis in stenosis; moderate distal right vertebral artery and left vertebral artery origin stenoses. 5. Otherwise posterior circulation remarkable for mild to moderate left PCA atherosclerosis and stenosis. 6. Stable and negative CT appearance of the brain.   Electronically Signed   By: Lars Pinks M.D.   On: 03/30/2014 15:03   Mr Brain Wo Contrast  03/30/2014   CLINICAL DATA:  80 year old male with weakness, fall in  bathroom. No loss of consciousness. Initial encounter.  EXAM: MRI HEAD WITHOUT CONTRAST  TECHNIQUE: Multiplanar, multiecho pulse sequences of the brain and surrounding structures were obtained without intravenous contrast.  COMPARISON:  CTA head and neck 1421 hr the same day and earlier.  FINDINGS: Diffusion weighted imaging reveal several small cortically based areas of restricted diffusion in the superior left hemisphere. These include the medial left frontal lobe pre motor area (should be left ACA territory, series 4, image 34)  as well as the posterior left frontal lobe closer to the motor strip (series 4, image 32).  No contralateral or posterior fossa restricted diffusion. Major intracranial vascular flow voids are preserved.  No midline shift, mass effect, evidence of mass lesion, ventriculomegaly, extra-axial collection or acute intracranial hemorrhage. Cervicomedullary junction and pituitary are within normal limits. Negative visualized cervical spine.  There are scattered chronic micro hemorrhages in the brain, including in the cerebellum. Superimposed minimal to mild for age scattered nonspecific cerebral white matter T2 and FLAIR hyperintensity. No cortical encephalomalacia.  Visible internal auditory structures appear normal. Mastoids are clear. Trace paranasal sinus mucosal thickening. Postoperative changes to the globes. Visualized scalp soft tissues are within normal limits. Normal bone marrow signal.  IMPRESSION: 1. Several tiny acute cortical infarcts in the superior left frontal gyrus, left MCA and left ACA territories. No mass effect or hemorrhage. 2. Scattered chronic micro hemorrhages in the brain compatible with chronic small vessel disease.   Electronically Signed   By: Lars Pinks M.D.   On: 03/30/2014 17:07    Scheduled Meds: . amLODipine  5 mg Oral Daily  . aspirin  300 mg Rectal Daily   Or  . aspirin  325 mg Oral Daily  . atenolol  25 mg Oral Daily  . atorvastatin  80 mg Oral QPC supper  . finasteride  2.5 mg Oral Daily  . heparin  5,000 Units Subcutaneous 3 times per day  . lisinopril  40 mg Oral Daily  . multivitamin-lutein  1 capsule Oral Daily  . pantoprazole  40 mg Oral Daily   Continuous Infusions: . sodium chloride      Active Problems:   Coronary atherosclerosis of artery bypass graft   Essential hypertension   Hyperlipidemia   Right bundle branch block   CVA (cerebral infarction)  Time spent: 40min  Doneen Ollinger, [facility] Pager [phone]. If 7PM-7AM, please contact  night-coverage at [email or web address], password [facility] 03/31/2014, 1:39 PM  LOS: 1 day

## 2014-03-31 NOTE — Evaluation (Signed)
Physical Therapy Evaluation Patient Details Name: [name] MRN: [id number] DOB: 1922/11/24 Today's Date: 03/31/2014   History of Present Illness  Admitted for stroke.    Clinical Impression  Pt admitted for stroke.  Presents to PT at his baseline for level of function. Pt was very active PTA.  Pt/family do not feel OT consult is necessary at this time.  No follow-up needs/equipment related to PT.  Follow Up Recommendations No PT follow up    Equipment Recommendations  None recommended by PT    Recommendations for Other Services       Precautions / Restrictions Precautions Precautions: Fall Restrictions Weight Bearing Restrictions: No      Mobility  Bed Mobility Overal bed mobility: Independent                Transfers Overall transfer level: Independent Equipment used: None                Ambulation/Gait Ambulation/Gait assistance: Independent Ambulation Distance (Feet): 300 Feet Assistive device: None Gait Pattern/deviations: WFL(Within Functional Limits)   Gait velocity interpretation: at or above normal speed for age/gender General Gait Details: no balance losses with gait, including head turns, speed changes.   Stairs Stairs: Yes Stairs assistance: Independent Stair Management: No rails Number of Stairs: 3 General stair comments: No difficulty with stairs  Wheelchair Mobility    Modified Rankin (Stroke Patients Only) Modified Rankin (Stroke Patients Only) Pre-Morbid Rankin Score: No symptoms Modified Rankin: No symptoms     Balance Overall balance assessment: Independent               Single Leg Stance - Right Leg: 20 Single Leg Stance - Left Leg: 15         High level balance activites: Direction changes;Turns;Sudden stops;Head turns (No balance losses with any of the above activities)   Standardized Balance Assessment Standardized Balance Assessment : Dynamic Gait Index   Dynamic Gait Index Level Surface:  Normal Change in Gait Speed: Normal Gait with Horizontal Head Turns: Normal Gait with Vertical Head Turns: Normal Gait and Pivot Turn: Normal Step Over Obstacle: Normal Step Around Obstacles: Normal Steps: Normal Total Score: 24       Pertinent Vitals/Pain Pain Assessment: No/denies pain    Home Living Family/patient expects to be discharged to:: Private residence Living Arrangements: Spouse/significant other Available Help at Discharge: Family Type of Home: House                Prior Function Level of Independence: Independent         Comments: Exercised at gym 3xweek. Drives.      Hand Dominance   Dominant Hand: Right    Extremity/Trunk Assessment   Upper Extremity Assessment: Overall WFL for tasks assessed           Lower Extremity Assessment: Overall WFL for tasks assessed      Cervical / Trunk Assessment: Normal  Communication   Communication: No difficulties  Cognition Arousal/Alertness: Awake/alert Behavior During Therapy: WFL for tasks assessed/performed Overall Cognitive Status: Within Functional Limits for tasks assessed                      General Comments General comments (skin integrity, edema, etc.): Pt's wife and daughter present for session.  Pt able to write at baseline with RUE.  Discussed role of OT with pt/family and they do not feel he will need that service.      Exercises  Assessment/Plan    PT Assessment    PT Diagnosis Generalized weakness   PT Problem List    PT Treatment Interventions     PT Goals (Current goals can be found in the Care Plan section) Acute Rehab PT Goals Patient Stated Goal: to go home    Frequency     Barriers to discharge        Co-evaluation               End of Session Equipment Utilized During Treatment: Gait belt Activity Tolerance: Patient tolerated treatment well Patient left: in bed;with bed alarm set Nurse Communication: Mobility status          Time: 1135-1202 PT Time Calculation (min) (ACUTE ONLY): 27 min   Charges:   PT Evaluation $Initial PT Evaluation Tier I: 1 Procedure PT Treatments $Gait Training: 8-22 mins   PT G CodesMelvern Banker 03/31/2014, 12:17 PM  Lavonia Dana, [place] 03/31/2014

## 2014-03-31 NOTE — Progress Notes (Signed)
  Echocardiogram 2D Echocardiogram has been performed.  [name] 03/31/2014, 3:19 PM

## 2014-04-01 ENCOUNTER — Telehealth: Payer: Self-pay | Admitting: Interventional Cardiology

## 2014-04-01 DIAGNOSIS — I63132 Cerebral infarction due to embolism of left carotid artery: Secondary | ICD-10-CM

## 2014-04-01 NOTE — Telephone Encounter (Signed)
New Msg    Pt wife Rod Holler calling, pt has been in the hospital for two days, they state he has had a stroke.   Please return call to Pt wife or daughter they may be reached at [phone].

## 2014-04-01 NOTE — Progress Notes (Signed)
OT Screen Note  Patient Details Name: CONAL SHETLEY MRN: [id number] DOB: May 04, 1922   Cancelled Treatment:    Reason Eval/Treat Not Completed: OT screened, no needs identified, will sign off. Pt at baseline per PT evaluation and no OT warranted.  Peri Maris  Pager: [phone]  04/01/2014, 8:29 AM

## 2014-04-01 NOTE — Progress Notes (Addendum)
STROKE TEAM PROGRESS NOTE   HISTORY [name] is an 80 y.o. male who reports that he awakened this morning as usual. He went to urinate and afterward became weak all over falling to the floor. He did not lose consciousness. He remained weak while on the floor but slowly had improvement in his strength with the left side returning some strength prior to the left and the RLE being the last to recover some strength. He was eventually able to pull himself up to the toilet and to call his wife. No speech abnormalities were noted. He finally got some help to get to the bed and EMS was called. Patient Was brought in for evaluation. Reports no significant weakness at this time.   No issues overnight as per nursing staff.  SUBJECTIVE (INTERVAL HISTORY) He feels that he is back to baseline. Discussed with pt about request for cardiac monitoring. Pt prefer loop recorder to rule out afib.  OBJECTIVE Temp:  [97.6 F (36.4 C)-98.2 F (36.8 C)] 97.6 F (36.4 C) (12/21 0943) Pulse Rate:  [51-65] 65 (12/21 0943) Cardiac Rhythm:  [-] Sinus bradycardia (12/21 0534) Resp:  [18-20] 20 (12/21 0943) BP: (156-173)/(49-57) 173/57 mmHg (12/21 0943) SpO2:  [95 %-97 %] 95 % (12/21 0943)  No results for input(s): GLUCAP in the last 168 hours.  Recent Labs Lab 03/30/14 1026  NA 142  K 4.1  CL 106  CO2 24  GLUCOSE 108*  BUN 23  CREATININE 0.96  CALCIUM 9.0    Recent Labs Lab 03/30/14 1026  AST 15  ALT 11  ALKPHOS 78  BILITOT 0.6  PROT 6.5  ALBUMIN 3.8    Recent Labs Lab 03/30/14 1026  WBC 6.2  NEUTROABS 4.7  HGB 13.4  HCT 39.6  MCV 87.2  PLT 207    Recent Labs Lab 03/30/14 1026  TROPONINI <0.30   No results for input(s): LABPROT, INR in the last 72 hours. No results for input(s): COLORURINE, LABSPEC, [place], GLUCOSEU, HGBUR, BILIRUBINUR, KETONESUR, PROTEINUR, UROBILINOGEN, NITRITE, LEUKOCYTESUR in the last 72 hours.  Invalid input(s): APPERANCEUR     Component  Value Date/Time   CHOL 147 03/31/2014 0338   TRIG 165* 03/31/2014 0338   HDL 32* 03/31/2014 0338   CHOLHDL 4.6 03/31/2014 0338   VLDL 33 03/31/2014 0338   LDLCALC 82 03/31/2014 0338   Lab Results  Component Value Date   HGBA1C 6.1* 03/31/2014   No results found for: LABOPIA, COCAINSCRNUR, LABBENZ, AMPHETMU, THCU, LABBARB  No results for input(s): ETH in the last 168 hours.  I have personally reviewed the radiological images below and agree with the radiology interpretations.  Ct Angio Head and Neck W/cm &/or Wo Cm 03/30/2014    1. Extracranial carotid arteries with no significant atherosclerotic stenosis, but tortuosity and a kinked appearance more so on the right.  2. Moderate to severe bilateral ICA siphon atherosclerotic stenosis related to bulky calcified plaque. High-grade stenosis of the right ICA anterior genu.  3. Otherwise mild anterior circulation atherosclerosis, mild right MCA M1 stenosis.  4. Overall moderate bilateral vertebral artery atherosclerosis in stenosis; moderate distal right vertebral artery and left vertebral artery origin stenoses.  5. Otherwise posterior circulation remarkable for mild to moderate left PCA atherosclerosis and stenosis.  6. Stable and negative CT appearance of the brain.     Chest 2 View 03/30/2014    Underlying emphysematous change. No edema or consolidation. No pneumothorax. Heart borderline enlarged.     Ct Head Wo Contrast 03/30/2014  1. No acute intracranial pathology.  2. Generalized cerebral atrophy and chronic microvascular disease.     Mr Brain Wo Contrast 03/30/2014    1. Several tiny acute cortical infarcts in the superior left frontal gyrus, left MCA and left ACA territories. No mass effect or hemorrhage.  2. Scattered chronic micro hemorrhages in the brain compatible with chronic small vessel disease.     PHYSICAL EXAM Mental Status: Alert, oriented, thought content appropriate. Speech fluent without evidence of  aphasia. Able to follow 3 step commands without difficulty. Cranial Nerves: II: Discs flat bilaterally; Visual fields grossly normal, pupils equal, round, reactive to light and accommodation III,IV, VI: ptosis not present, extra-ocular motions intact bilaterally V,VII: smile symmetric, facial light touch sensation normal bilaterally VIII: hearing normal bilaterally IX,X: gag reflex present XI: bilateral shoulder shrug XII: midline tongue extension Motor: Strength 5/5 throughout Tone and bulk:normal tone throughout; no atrophy noted Sensory: Pinprick and light touch intact throughout, bilaterally Cerebellar: normal finger-to-nose Gait: Not tested   ASSESSMENT/PLAN [name] is a 80 y.o. male with history of hypertension, hyperlipidemia, coronary artery disease with coronary artery bypass graft surgery in 2000 presenting with generalized weakness after falling. He did not receive IV t-PA  secondary to minimal deficits.  Strokes:  Dominant infarcts involving left MCA and ACA territory - possibly embolic secondary to an unknown source.  Resultant - resolution of deficits  MRI  - as above  MRA  not ordered  CTA head and neck - moderate to severe bilateral intracranial ICA disease  Carotid Doppler - See CTA of neck  2D Echo - EF 60-65% no clot, no shunt  LDL - 82, not at the goal   HgbA1c 6.1, at the goal  Subcutaneous heparin for VTE prophylaxis  Diet Heart with thin liquids  aspirin 81 mg orally every day prior to admission, now on aspirin 325 mg orally every day  Patient counseled to be compliant with his antithrombotic medications  Ongoing aggressive stroke risk factor management  Therapy recommendations:  No follow-up PT recommended  Disposition: Pending  Hypertension  Home meds:   Norvasc 5 mg daily, Tenormin 25 mg twice daily, Microzide 12.5 mg daily, and lisinopril 40 mg daily  Stable  Patient counseled to be compliant with his blood pressure  medications  Hyperlipidemia  Home meds:  Lipitor 40 mg daily - resumed in hospital  LDL 82, goal < 70  Increase Lipitor to 80 mg daily   Continue statin at discharge   Other Stroke Risk Factors  Advanced age  Coronary artery disease  High-grade stenosis right internal carotid artery   Other Active Problems  High-grade stenosis right internal carotid artery  ? Cardioembolic event Consult Cariology for TEE with LOOP Monitior.  Other Pertinent History     Hospital day # 2  Lanelle Bal, PA-C [facility] Pager [phone] 04/01/2014, 12:16 PM  I, the attending vascular neurologist, have personally obtained a history, examined the patient, evaluated laboratory data, individually viewed imaging studies. Together with the NP/PA, we formulated the assessment and plan of care which reflects our mutual decision.  I have made any additions or clarifications directly to the above note and agree with the findings and plan as currently documented.   Please note: All text in blue color in the note is my addition to the original note.   Rosalin Hawking, MD PhD Stroke Neurology 04/01/2014 11:37 PM    To contact Stroke Continuity provider, please refer to [email or web address] After  hours, contact General Neurology

## 2014-04-01 NOTE — Telephone Encounter (Signed)
I spoke with the pt's daughter and she wanted to make Dr Tamala Julian aware that the pt is in the hospital with a stroke.  The pt is very concerned because they have told him that he cannot drive until his carotids are further evaluated.  The pt requested that his daughter contact our office to see if we can get this process moving. I made her aware that because the pt is still hospitalized I am unsure if any consults have been requested for Cardiology or VVS (pt had CTA of head/neck).  I told her at this time the pt needs to see if any consult occurs during hospitalization.  I will forward this message to Dr Tamala Julian to review the pt's chart after discharge and determine if further orders need to be given to follow-up on carotid.

## 2014-04-01 NOTE — Evaluation (Signed)
Speech Language Pathology Evaluation Patient Details Name: [name] MRN: [id number] DOB: 1922-11-04 Today's Date: 04/01/2014 Time: 7124-5809 SLP Time Calculation (min) (ACUTE ONLY): 35 min  Problem List:  Patient Active Problem List   Diagnosis Date Noted  . CVA (cerebral infarction) 03/30/2014  . Coronary atherosclerosis of artery bypass graft 12/20/2013  . Essential hypertension 12/20/2013  . Hyperlipidemia 12/20/2013  . Right bundle branch block 12/20/2013   Past Medical History:  Past Medical History  Diagnosis Date  . Right bundle branch block 12/20/2013  . Hyperlipidemia 12/20/2013  . Essential hypertension 12/20/2013  . Coronary atherosclerosis of artery bypass graft 12/20/2013    CASHD with CABG 2000 with LIMA to LAD, SV graft to diagonal, SVG to OM, and saphenous vein graft to distal RCA.      Past Surgical History: History reviewed. No pertinent past surgical history. HPI:  Pt is a 80 y.o. male with a Past Medical History of CAD status post CABG, dyslipidemia, and hypertension who presented on 03/30/2014 with right lower extremity generalized weakness; MRI on 03/30/14 indicated several tiny acute cortical infarcts in the superior left frontal gyrus, left MCA and left ACA territories. No mass effect or hemorrhage noted   Assessment / Plan / Recommendation Clinical Impression   Pt exhibited appropriate speech/language skills at present time, but has noticed intermittent (infrequent) word finding (anomia) during complex conversational tasks.  He is aware of errors and is using compensatory strategies independently such as synonym use and circumlocution during conversation.  Pt was able to name items appropriately during convergent, confrontational, divergent and complex conversational tasks with 90-100% accuracy during the evaluation.   Due to patient's education level, awareness of errors and previous cognitive status, therapy is not warranted at this time.  SLP did discuss  with pt, availability of prn home health SLP if anomia became more frequent during conversational tasks. Pt denies any difficulty with swallowing and passed a swallowing screening on 03/30/14.    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[employer])   Cognition  Overall Cognitive Status: Within Functional Limits for tasks assessed Arousal/Alertness: Awake/alert Orientation Level: Oriented X4 Memory: Appears intact Awareness: Appears intact Problem Solving: Appears intact Safety/Judgment: Appears intact    Comprehension  Auditory Comprehension Overall Auditory Comprehension: Appears within functional limits for tasks assessed Yes/No Questions: Within Functional Limits Commands: Within Functional Limits Visual Recognition/Discrimination Discrimination: Within Function Limits Reading Comprehension Reading Status: Within funtional limits    Expression Expression Primary Mode of Expression: Verbal Verbal Expression Overall Verbal Expression: Appears within functional limits for tasks assessed Initiation: No impairment Level of Generative/Spontaneous Verbalization: Conversation Repetition: No impairment Naming: Impairment Responsive: 76-100% accurate Confrontation: Within functional limits Convergent: 75-100% accurate Divergent: 75-100% accurate Other Naming Comments: Pt stated intermittent (infrequent) anomia during complex conversational tasks; compensating for  difficulties independently, aware of errors Verbal Errors: Aware of errors Pragmatics: No impairment Non-Verbal Means of Communication: Not applicable Written Expression Dominant Hand: Right Written Expression: Not tested   Oral / Motor Oral Motor/Sensory Function Overall  Oral Motor/Sensory Function: Appears within functional limits for tasks assessed Motor Speech Overall Motor Speech: Appears within functional limits for tasks assessed Respiration: Within functional limits Phonation: Normal Resonance: Within functional limits Articulation: Within functional limitis Intelligibility: Intelligible Motor Planning: Witnin functional limits Motor Speech Errors: Not applicable        Elio Haden,PAT, M.S., CCC-SLP 04/01/2014, 9:59 AM

## 2014-04-01 NOTE — Progress Notes (Signed)
[facility] PROGRESS NOTE  MATS JEANLOUIS INO:[id number] DOB: [birth year] DOA: 03/30/2014 PCP: Jani Gravel, MD  Assessment/Plan: 1. Acute CVA 1. Involving L frontal gyrus, L MCA, L ACA territories 2. Neurology following 3. 2d echo with EF 60-65%, no WMA, no PFO 4. CT neck with mod-severe B ICA disease 5. Continue on ASA and statin 6. Per Neurology, there are concerns for cardioembolic source to CVA. Recs for TEE. Neurology has consulted Cardiology for TEE 2. HLD 1. On statin per above 3. HTN 1. BP stable 2. Allowing permissive htn 4. Hx CAD s/p CABG 1. Currently asymptomatic and chest pain free 5. DVT prophylaxis 1. Heparin subQ  Code Status: Full Family Communication: Pt in room, family at bedside Disposition Plan: pending   Consultants:  Neurology  Procedures:    Antibiotics:     HPI/Subjective: No acute events noted overnight. Remains eager to go home  Objective: Filed Vitals:   03/31/14 2100 04/01/14 0130 04/01/14 0534 04/01/14 0943  BP: 163/54 160/55 156/50 173/57  Pulse: 54 55 55 65  Temp: 97.8 F (36.6 C) 98 F (36.7 C) 98.2 F (36.8 C) 97.6 F (36.4 C)  TempSrc: Oral Oral Oral   Resp: 18 18 18 20   Height:      Weight:      SpO2: 95% 95% 96% 95%   No intake or output data in the 24 hours ending 04/01/14 1212 Filed Weights   03/30/14 1821  Weight: 86.183 kg (190 lb)    Exam:   General:  Awake, in nad  Cardiovascular: regular, s1, s2  Respiratory: normal resp effort, no wheezing  Abdomen: soft, nondistended  Musculoskeletal: perfused, no clubbing   Data Reviewed: Basic Metabolic Panel:  Recent Labs Lab 03/30/14 1026  NA 142  K 4.1  CL 106  CO2 24  GLUCOSE 108*  BUN 23  CREATININE 0.96  CALCIUM 9.0   Liver Function Tests:  Recent Labs Lab 03/30/14 1026  AST 15  ALT 11  ALKPHOS 78  BILITOT 0.6  PROT 6.5  ALBUMIN 3.8   No results for input(s): LIPASE, AMYLASE in the last 168 hours. No results for  input(s): AMMONIA in the last 168 hours. CBC:  Recent Labs Lab 03/30/14 1026  WBC 6.2  NEUTROABS 4.7  HGB 13.4  HCT 39.6  MCV 87.2  PLT 207   Cardiac Enzymes:  Recent Labs Lab 03/30/14 1026  TROPONINI <0.30   BNP (last 3 results) No results for input(s): PROBNP in the last 8760 hours. CBG: No results for input(s): GLUCAP in the last 168 hours.  No results found for this or any previous visit (from the past 240 hour(s)).   Studies: Ct Angio Head W/cm &/or Wo Cm  03/30/2014   CLINICAL DATA:  80 year old male with syncope and collapse. Initial encounter.  EXAM: CT ANGIOGRAPHY HEAD AND NECK  TECHNIQUE: Multidetector CT imaging of the head and neck was performed using the standard protocol during bolus administration of intravenous contrast. Multiplanar CT image reconstructions and MIPs were obtained to evaluate the vascular anatomy. Carotid stenosis measurements (when applicable) are obtained utilizing NASCET criteria, using the distal internal carotid diameter as the denominator.  CONTRAST:  78mL OMNIPAQUE IOHEXOL 350 MG/ML SOLN  COMPARISON:  Head CT without contrast 1056 hr the same day.  FINDINGS: CTA HEAD FINDINGS  No abnormal enhancement identified. No evidence of cortically based acute infarction identified. No midline shift, mass effect, or evidence of intracranial mass lesion.  VASCULAR FINDINGS:  Major intracranial venous structures  are enhancing.  Calcified plaque and irregularity of both distal vertebral arteries, moderate stenosis on the right as the vessel enters the skull. Mild stenosis on the left. The right PICA origin remains patent. Vertebrobasilar junction is patent. Dominant appearing left AICA. No basilar artery stenosis. SCA origins are patent. Mildly irregular left PCA origin. Moderately irregular left P1 and P2 segments, with mild to moderate stenosis. Fetal type right PCA origin. Right PCA branches are within normal limits.  Extensive Calcified plaque in both ICA  siphons.  High-grade stenosis at the right ICA anterior genu (series 408, image 479 and sagittal image 59). This is just proximal to the right ophthalmic artery origin which remains patent. Supra clinoid calcified plaque occurs with up to 50% stenosis. The right ICA terminus is patent.  Calcified plaque in the left ICA siphon with moderate stenosis in the anterior genu and proximal supra clinoid segment. Left ICA terminus is patent.  MCA origins are within normal limits. The left ACA A1 segment is dominant. Normal anterior communicating artery. Visualized bilateral ACA branches are within normal limits.  Mildly to moderately irregular right MCA M1 segment, mild M1 stenosis. Right MCA branches are within normal limits. Mildly irregular left MCA M1 segment. Left MCA branches are within normal limits.  Review of the MIP images confirms the above findings.  CTA NECK FINDINGS  Negative lung apices. No superior mediastinal lymphadenopathy. Sequelae of median sternotomy. Thyroid is within normal limits for age. Evaluation of the larynx and pharynx degraded by motion. Parapharyngeal spaces, retropharyngeal space, sublingual space, submandibular glands, parotid glands, and orbits soft tissues are within normal limits. Trace paranasal sinus mucosal thickening. Degenerative changes in the spine. No acute osseous abnormality identified. No cervical lymphadenopathy.  VASCULAR FINDINGS:  Soft and calcified plaque affecting the aortic arch. Three vessel arch configuration. No great vessel origin stenosis.  No right CCA origin stenosis. Soft and calcified plaque at the right carotid bifurcation with less than 50% stenosis. Tortuous cervical right ICA just beyond the bulb with a kinked appearance (sagittal image 33). Additional tortuosity just proximal to the skullbase with a mildly kinked appearance (coronal image 94).  No proximal right subclavian artery stenosis. Calcified plaque at the right vertebral artery origin, but no  hemodynamically significant stenosis results. Mild irregularity in the V2 segment, with soft and occasionally calcified plaque. No V2 segment stenosis. Just proximal to the skullbase there is more moderate irregularity of the vessel with calcified plaque. See the intracranial findings.  No left CCA origin stenosis. Mild motion artifact at the left carotid bifurcation. Soft and calcified plaque resulting in less than 50 % stenosis with respect to the distal vessel. Tortuous left cervical ICA distal to the bulb with additional soft and calcified plaque just below the skullbase, but no stenosis occurs. Rather the vessel appears mildly dolichoectatic.  No proximal left subclavian artery stenosis. Soft and calcified plaque at the left vertebral artery origin resulting in moderate stenosis (coronal image 126). There is also moderate stenosis of the vessel in the distal V2 segment which appears related to soft plaque (coronal image 106). The left vertebral artery remains patent to the skullbase.  Review of the MIP images confirms the above findings.  IMPRESSION: 1. Extracranial carotid arteries with no significant atherosclerotic stenosis, but tortuosity and a kinked appearance more so on the right. 2. Moderate to severe bilateral ICA siphon atherosclerotic stenosis related to bulky calcified plaque. High-grade stenosis of the right ICA anterior genu. 3. Otherwise mild anterior circulation atherosclerosis, mild right  MCA M1 stenosis. 4. Overall moderate bilateral vertebral artery atherosclerosis in stenosis; moderate distal right vertebral artery and left vertebral artery origin stenoses. 5. Otherwise posterior circulation remarkable for mild to moderate left PCA atherosclerosis and stenosis. 6. Stable and negative CT appearance of the brain.   Electronically Signed   By: Lars Pinks M.D.   On: 03/30/2014 15:03   Ct Angio Neck W/cm &/or Wo/cm  03/30/2014   CLINICAL DATA:  80 year old male with syncope and collapse.  Initial encounter.  EXAM: CT ANGIOGRAPHY HEAD AND NECK  TECHNIQUE: Multidetector CT imaging of the head and neck was performed using the standard protocol during bolus administration of intravenous contrast. Multiplanar CT image reconstructions and MIPs were obtained to evaluate the vascular anatomy. Carotid stenosis measurements (when applicable) are obtained utilizing NASCET criteria, using the distal internal carotid diameter as the denominator.  CONTRAST:  96mL OMNIPAQUE IOHEXOL 350 MG/ML SOLN  COMPARISON:  Head CT without contrast 1056 hr the same day.  FINDINGS: CTA HEAD FINDINGS  No abnormal enhancement identified. No evidence of cortically based acute infarction identified. No midline shift, mass effect, or evidence of intracranial mass lesion.  VASCULAR FINDINGS:  Major intracranial venous structures are enhancing.  Calcified plaque and irregularity of both distal vertebral arteries, moderate stenosis on the right as the vessel enters the skull. Mild stenosis on the left. The right PICA origin remains patent. Vertebrobasilar junction is patent. Dominant appearing left AICA. No basilar artery stenosis. SCA origins are patent. Mildly irregular left PCA origin. Moderately irregular left P1 and P2 segments, with mild to moderate stenosis. Fetal type right PCA origin. Right PCA branches are within normal limits.  Extensive Calcified plaque in both ICA siphons.  High-grade stenosis at the right ICA anterior genu (series 408, image 479 and sagittal image 59). This is just proximal to the right ophthalmic artery origin which remains patent. Supra clinoid calcified plaque occurs with up to 50% stenosis. The right ICA terminus is patent.  Calcified plaque in the left ICA siphon with moderate stenosis in the anterior genu and proximal supra clinoid segment. Left ICA terminus is patent.  MCA origins are within normal limits. The left ACA A1 segment is dominant. Normal anterior communicating artery. Visualized bilateral  ACA branches are within normal limits.  Mildly to moderately irregular right MCA M1 segment, mild M1 stenosis. Right MCA branches are within normal limits. Mildly irregular left MCA M1 segment. Left MCA branches are within normal limits.  Review of the MIP images confirms the above findings.  CTA NECK FINDINGS  Negative lung apices. No superior mediastinal lymphadenopathy. Sequelae of median sternotomy. Thyroid is within normal limits for age. Evaluation of the larynx and pharynx degraded by motion. Parapharyngeal spaces, retropharyngeal space, sublingual space, submandibular glands, parotid glands, and orbits soft tissues are within normal limits. Trace paranasal sinus mucosal thickening. Degenerative changes in the spine. No acute osseous abnormality identified. No cervical lymphadenopathy.  VASCULAR FINDINGS:  Soft and calcified plaque affecting the aortic arch. Three vessel arch configuration. No great vessel origin stenosis.  No right CCA origin stenosis. Soft and calcified plaque at the right carotid bifurcation with less than 50% stenosis. Tortuous cervical right ICA just beyond the bulb with a kinked appearance (sagittal image 33). Additional tortuosity just proximal to the skullbase with a mildly kinked appearance (coronal image 94).  No proximal right subclavian artery stenosis. Calcified plaque at the right vertebral artery origin, but no hemodynamically significant stenosis results. Mild irregularity in the V2 segment, with soft  and occasionally calcified plaque. No V2 segment stenosis. Just proximal to the skullbase there is more moderate irregularity of the vessel with calcified plaque. See the intracranial findings.  No left CCA origin stenosis. Mild motion artifact at the left carotid bifurcation. Soft and calcified plaque resulting in less than 50 % stenosis with respect to the distal vessel. Tortuous left cervical ICA distal to the bulb with additional soft and calcified plaque just below the  skullbase, but no stenosis occurs. Rather the vessel appears mildly dolichoectatic.  No proximal left subclavian artery stenosis. Soft and calcified plaque at the left vertebral artery origin resulting in moderate stenosis (coronal image 126). There is also moderate stenosis of the vessel in the distal V2 segment which appears related to soft plaque (coronal image 106). The left vertebral artery remains patent to the skullbase.  Review of the MIP images confirms the above findings.  IMPRESSION: 1. Extracranial carotid arteries with no significant atherosclerotic stenosis, but tortuosity and a kinked appearance more so on the right. 2. Moderate to severe bilateral ICA siphon atherosclerotic stenosis related to bulky calcified plaque. High-grade stenosis of the right ICA anterior genu. 3. Otherwise mild anterior circulation atherosclerosis, mild right MCA M1 stenosis. 4. Overall moderate bilateral vertebral artery atherosclerosis in stenosis; moderate distal right vertebral artery and left vertebral artery origin stenoses. 5. Otherwise posterior circulation remarkable for mild to moderate left PCA atherosclerosis and stenosis. 6. Stable and negative CT appearance of the brain.   Electronically Signed   By: Lars Pinks M.D.   On: 03/30/2014 15:03   Mr Brain Wo Contrast  03/30/2014   CLINICAL DATA:  80 year old male with weakness, fall in bathroom. No loss of consciousness. Initial encounter.  EXAM: MRI HEAD WITHOUT CONTRAST  TECHNIQUE: Multiplanar, multiecho pulse sequences of the brain and surrounding structures were obtained without intravenous contrast.  COMPARISON:  CTA head and neck 1421 hr the same day and earlier.  FINDINGS: Diffusion weighted imaging reveal several small cortically based areas of restricted diffusion in the superior left hemisphere. These include the medial left frontal lobe pre motor area (should be left ACA territory, series 4, image 34) as well as the posterior left frontal lobe closer to  the motor strip (series 4, image 32).  No contralateral or posterior fossa restricted diffusion. Major intracranial vascular flow voids are preserved.  No midline shift, mass effect, evidence of mass lesion, ventriculomegaly, extra-axial collection or acute intracranial hemorrhage. Cervicomedullary junction and pituitary are within normal limits. Negative visualized cervical spine.  There are scattered chronic micro hemorrhages in the brain, including in the cerebellum. Superimposed minimal to mild for age scattered nonspecific cerebral white matter T2 and FLAIR hyperintensity. No cortical encephalomalacia.  Visible internal auditory structures appear normal. Mastoids are clear. Trace paranasal sinus mucosal thickening. Postoperative changes to the globes. Visualized scalp soft tissues are within normal limits. Normal bone marrow signal.  IMPRESSION: 1. Several tiny acute cortical infarcts in the superior left frontal gyrus, left MCA and left ACA territories. No mass effect or hemorrhage. 2. Scattered chronic micro hemorrhages in the brain compatible with chronic small vessel disease.   Electronically Signed   By: Lars Pinks M.D.   On: 03/30/2014 17:07    Scheduled Meds: . amLODipine  5 mg Oral Daily  . aspirin  300 mg Rectal Daily   Or  . aspirin  325 mg Oral Daily  . atenolol  25 mg Oral Daily  . atorvastatin  80 mg Oral QPC supper  . finasteride  2.5 mg Oral Daily  . heparin  5,000 Units Subcutaneous 3 times per day  . lisinopril  40 mg Oral Daily  . multivitamin-lutein  1 capsule Oral Daily  . pantoprazole  40 mg Oral Daily   Continuous Infusions: . sodium chloride      Active Problems:   Coronary atherosclerosis of artery bypass graft   Essential hypertension   Hyperlipidemia   Right bundle branch block   CVA (cerebral infarction)  Time spent: 58min  [facility], [facility] Pager [phone]. If 7PM-7AM, please contact night-coverage at [email or web address], password  [facility] 04/01/2014, 12:12 PM  LOS: 2 days

## 2014-04-01 NOTE — Progress Notes (Signed)
    [facility] has been requested to perform a transesophageal echocardiogram on 04/02/2014 for CVA.  After careful review of history and examination, the risks and benefits of transesophageal echocardiogram have been explained including risks of esophageal damage, perforation (1:10,000 risk), bleeding, pharyngeal hematoma as well as other potential complications associated with conscious sedation including aspiration, arrhythmia, respiratory failure and death. Alternatives to treatment were discussed, questions were answered. Patient is willing to proceed.   Almyra Deforest, [place] 04/01/2014 6:15 PM

## 2014-04-02 ENCOUNTER — Encounter (HOSPITAL_COMMUNITY): Payer: Self-pay | Admitting: *Deleted

## 2014-04-02 ENCOUNTER — Encounter (HOSPITAL_COMMUNITY)
Admission: EM | Disposition: A | Discharge status: Home / Self Care | Payer: Self-pay | Source: Home / Self Care | Attending: Internal Medicine

## 2014-04-02 DIAGNOSIS — I340 Nonrheumatic mitral (valve) insufficiency: Secondary | ICD-10-CM

## 2014-04-02 HISTORY — PX: TEE WITHOUT CARDIOVERSION: SHX5443

## 2014-04-02 SURGERY — ECHOCARDIOGRAM, TRANSESOPHAGEAL
Anesthesia: Moderate Sedation

## 2014-04-02 MED ORDER — FENTANYL CITRATE 0.05 MG/ML IJ SOLN
INTRAMUSCULAR | Status: DC | PRN
  Administered 2014-04-02: 25 ug via INTRAVENOUS

## 2014-04-02 MED ORDER — SODIUM CHLORIDE 0.9 % IV SOLN
INTRAVENOUS | Status: DC
  Administered 2014-04-02: 500 mL via INTRAVENOUS

## 2014-04-02 MED ORDER — BUTAMBEN-TETRACAINE-BENZOCAINE 2-2-14 % EX AERO
INHALATION_SPRAY | CUTANEOUS | Status: DC | PRN
  Administered 2014-04-02: 2 via TOPICAL

## 2014-04-02 MED ORDER — ATORVASTATIN CALCIUM 80 MG PO TABS: 80.0000 mg | ORAL_TABLET | Freq: Every day | ORAL | Status: DC

## 2014-04-02 MED ORDER — MIDAZOLAM HCL 5 MG/ML IJ SOLN
INTRAMUSCULAR | Status: AC
  Filled 2014-04-02: qty 2

## 2014-04-02 MED ORDER — DIPHENHYDRAMINE HCL 50 MG/ML IJ SOLN
INTRAMUSCULAR | Status: AC
  Filled 2014-04-02: qty 1

## 2014-04-02 MED ORDER — MIDAZOLAM HCL 10 MG/2ML IJ SOLN
INTRAMUSCULAR | Status: DC | PRN
  Administered 2014-04-02: 2 mg via INTRAVENOUS
  Administered 2014-04-02: 1 mg via INTRAVENOUS

## 2014-04-02 MED ORDER — FENTANYL CITRATE 0.05 MG/ML IJ SOLN
INTRAMUSCULAR | Status: AC
  Filled 2014-04-02: qty 2

## 2014-04-02 MED ORDER — ASPIRIN 325 MG PO TABS: 325.0000 mg | ORAL_TABLET | Freq: Every day | ORAL | Status: DC

## 2014-04-02 NOTE — Progress Notes (Signed)
Echocardiogram Echocardiogram Transesophageal has been performed.  [name] 04/02/2014, 4:13 PM

## 2014-04-02 NOTE — H&P (View-Only) (Signed)
STROKE TEAM PROGRESS NOTE   HISTORY [name] is an 80 y.o. male who reports that he awakened this morning as usual. He went to urinate and afterward became weak all over falling to the floor. He did not lose consciousness. He remained weak while on the floor but slowly had improvement in his strength with the left side returning some strength prior to the left and the RLE being the last to recover some strength. He was eventually able to pull himself up to the toilet and to call his wife. No speech abnormalities were noted. He finally got some help to get to the bed and EMS was called. Patient Was brought in for evaluation. Reports no significant weakness at this time.   No issues overnight as per nursing staff.  SUBJECTIVE (INTERVAL HISTORY) He feels that he is back to baseline. Discussed with pt about request for cardiac monitoring. Pt prefer loop recorder to rule out afib.  OBJECTIVE Temp:  [97.6 F (36.4 C)-98.2 F (36.8 C)] 97.6 F (36.4 C) (12/21 0943) Pulse Rate:  [51-65] 65 (12/21 0943) Cardiac Rhythm:  [-] Sinus bradycardia (12/21 0534) Resp:  [18-20] 20 (12/21 0943) BP: (156-173)/(49-57) 173/57 mmHg (12/21 0943) SpO2:  [95 %-97 %] 95 % (12/21 0943)  No results for input(s): GLUCAP in the last 168 hours.  Recent Labs Lab 03/30/14 1026  NA 142  K 4.1  CL 106  CO2 24  GLUCOSE 108*  BUN 23  CREATININE 0.96  CALCIUM 9.0    Recent Labs Lab 03/30/14 1026  AST 15  ALT 11  ALKPHOS 78  BILITOT 0.6  PROT 6.5  ALBUMIN 3.8    Recent Labs Lab 03/30/14 1026  WBC 6.2  NEUTROABS 4.7  HGB 13.4  HCT 39.6  MCV 87.2  PLT 207    Recent Labs Lab 03/30/14 1026  TROPONINI <0.30   No results for input(s): LABPROT, INR in the last 72 hours. No results for input(s): COLORURINE, LABSPEC, [place], GLUCOSEU, HGBUR, BILIRUBINUR, KETONESUR, PROTEINUR, UROBILINOGEN, NITRITE, LEUKOCYTESUR in the last 72 hours.  Invalid input(s): APPERANCEUR     Component  Value Date/Time   CHOL 147 03/31/2014 0338   TRIG 165* 03/31/2014 0338   HDL 32* 03/31/2014 0338   CHOLHDL 4.6 03/31/2014 0338   VLDL 33 03/31/2014 0338   LDLCALC 82 03/31/2014 0338   Lab Results  Component Value Date   HGBA1C 6.1* 03/31/2014   No results found for: LABOPIA, COCAINSCRNUR, LABBENZ, AMPHETMU, THCU, LABBARB  No results for input(s): ETH in the last 168 hours.  I have personally reviewed the radiological images below and agree with the radiology interpretations.  Ct Angio Head and Neck W/cm &/or Wo Cm 03/30/2014    1. Extracranial carotid arteries with no significant atherosclerotic stenosis, but tortuosity and a kinked appearance more so on the right.  2. Moderate to severe bilateral ICA siphon atherosclerotic stenosis related to bulky calcified plaque. High-grade stenosis of the right ICA anterior genu.  3. Otherwise mild anterior circulation atherosclerosis, mild right MCA M1 stenosis.  4. Overall moderate bilateral vertebral artery atherosclerosis in stenosis; moderate distal right vertebral artery and left vertebral artery origin stenoses.  5. Otherwise posterior circulation remarkable for mild to moderate left PCA atherosclerosis and stenosis.  6. Stable and negative CT appearance of the brain.     Chest 2 View 03/30/2014    Underlying emphysematous change. No edema or consolidation. No pneumothorax. Heart borderline enlarged.     Ct Head Wo Contrast 03/30/2014  1. No acute intracranial pathology.  2. Generalized cerebral atrophy and chronic microvascular disease.     Mr Brain Wo Contrast 03/30/2014    1. Several tiny acute cortical infarcts in the superior left frontal gyrus, left MCA and left ACA territories. No mass effect or hemorrhage.  2. Scattered chronic micro hemorrhages in the brain compatible with chronic small vessel disease.     PHYSICAL EXAM Mental Status: Alert, oriented, thought content appropriate. Speech fluent without evidence of  aphasia. Able to follow 3 step commands without difficulty. Cranial Nerves: II: Discs flat bilaterally; Visual fields grossly normal, pupils equal, round, reactive to light and accommodation III,IV, VI: ptosis not present, extra-ocular motions intact bilaterally V,VII: smile symmetric, facial light touch sensation normal bilaterally VIII: hearing normal bilaterally IX,X: gag reflex present XI: bilateral shoulder shrug XII: midline tongue extension Motor: Strength 5/5 throughout Tone and bulk:normal tone throughout; no atrophy noted Sensory: Pinprick and light touch intact throughout, bilaterally Cerebellar: normal finger-to-nose Gait: Not tested   ASSESSMENT/PLAN [name] is a 80 y.o. male with history of hypertension, hyperlipidemia, coronary artery disease with coronary artery bypass graft surgery in 2000 presenting with generalized weakness after falling. He did not receive IV t-PA  secondary to minimal deficits.  Strokes:  Dominant infarcts involving left MCA and ACA territory - possibly embolic secondary to an unknown source.  Resultant - resolution of deficits  MRI  - as above  MRA  not ordered  CTA head and neck - moderate to severe bilateral intracranial ICA disease  Carotid Doppler - See CTA of neck  2D Echo - EF 60-65% no clot, no shunt  LDL - 82, not at the goal   HgbA1c 6.1, at the goal  Subcutaneous heparin for VTE prophylaxis  Diet Heart with thin liquids  aspirin 81 mg orally every day prior to admission, now on aspirin 325 mg orally every day  Patient counseled to be compliant with his antithrombotic medications  Ongoing aggressive stroke risk factor management  Therapy recommendations:  No follow-up PT recommended  Disposition: Pending  Hypertension  Home meds:   Norvasc 5 mg daily, Tenormin 25 mg twice daily, Microzide 12.5 mg daily, and lisinopril 40 mg daily  Stable  Patient counseled to be compliant with his blood pressure  medications  Hyperlipidemia  Home meds:  Lipitor 40 mg daily - resumed in hospital  LDL 82, goal < 70  Increase Lipitor to 80 mg daily   Continue statin at discharge   Other Stroke Risk Factors  Advanced age  Coronary artery disease  High-grade stenosis right internal carotid artery   Other Active Problems  High-grade stenosis right internal carotid artery  ? Cardioembolic event Consult Cariology for TEE with LOOP Monitior.  Other Pertinent History     Hospital day # 2  Lanelle Bal, PA-C [facility] Pager [phone] 04/01/2014, 12:16 PM  I, the attending vascular neurologist, have personally obtained a history, examined the patient, evaluated laboratory data, individually viewed imaging studies. Together with the NP/PA, we formulated the assessment and plan of care which reflects our mutual decision.  I have made any additions or clarifications directly to the above note and agree with the findings and plan as currently documented.   Please note: All text in blue color in the note is my addition to the original note.   Rosalin Hawking, MD PhD Stroke Neurology 04/01/2014 11:37 PM    To contact Stroke Continuity provider, please refer to [email or web address] After  hours, contact General Neurology

## 2014-04-02 NOTE — Discharge Instructions (Signed)
Transesophageal Echocardiogram °Transesophageal echocardiography (TEE) is a picture test of your heart using sound waves. The pictures taken can give very detailed pictures of your heart. This can help your doctor see if there are problems with your heart. TEE can check: °· If your heart has blood clots in it. °· How well your heart valves are working. °· If you have an infection on the inside of your heart. °· Some of the major arteries of your heart. °· If your heart valve is working after a repair. °· Your heart before a procedure that uses a shock to your heart to get the rhythm back to normal. °BEFORE THE PROCEDURE °· Do not eat or drink for 6 hours before the procedure or as told by your doctor. °· Make plans to have someone drive you home after the procedure. Do not drive yourself home. °· An IV tube will be put in your arm. °PROCEDURE °· You will be given a medicine to help you relax (sedative). It will be given through the IV tube. °· A numbing medicine will be sprayed or gargled in the back of your throat to help numb it. °· The tip of the probe is placed into the back of your mouth. You will be asked to swallow. This helps to pass the probe into your esophagus. °· Once the tip of the probe is in the right place, your doctor can take pictures of your heart. °· You may feel pressure at the back of your throat. °AFTER THE PROCEDURE °· You will be taken to a recovery area so the sedative can wear off. °· Your throat may be sore and scratchy. This will go away slowly over time. °· You will go home when you are fully awake and able to swallow liquids. °· You should have someone stay with you for the next 24 hours. °· Do not drive or operate machinery for the next 24 hours. °Document Released: 01/24/2009 Document Revised: 04/03/2013 Document Reviewed: 09/28/2012 °[employer]® Patient Information ©2015 [employer]. This information is not intended to replace advice given to you by your health care provider. Make  sure you discuss any questions you have with your health care provider. ° °

## 2014-04-02 NOTE — CV Procedure (Signed)
See full TEE report in [facility]; normal LV function; negative saline microcavitation study. [name]  

## 2014-04-02 NOTE — Progress Notes (Signed)
STROKE TEAM PROGRESS NOTE   HISTORY [name] is an 80 y.o. male who reports that he awakened this morning as usual. He went to urinate and afterward became weak all over falling to the floor. He did not lose consciousness. He remained weak while on the floor but slowly had improvement in his strength with the left side returning some strength prior to the left and the RLE being the last to recover some strength. He was eventually able to pull himself up to the toilet and to call his wife. No speech abnormalities were noted. He finally got some help to get to the bed and EMS was called. Patient Was brought in for evaluation. Reports no significant weakness at this time.    SUBJECTIVE (INTERVAL HISTORY) Plans for discharge today after TEE and loop placement. Dr. Erlinda Hong addressed and answered pt questions related to dx (stroke and ICA stenosis) prognosis and treatment.   OBJECTIVE Temp:  [97.8 F (36.6 C)-98.2 F (36.8 C)] 98 F (36.7 C) (12/22 0955) Pulse Rate:  [48-58] 51 (12/22 0955) Cardiac Rhythm:  [-] Sinus bradycardia (12/21 2005) Resp:  [16-18] 16 (12/22 0955) BP: (141-168)/(48-62) 144/55 mmHg (12/22 0955) SpO2:  [93 %-97 %] 97 % (12/22 0955)  No results for input(s): GLUCAP in the last 168 hours.  Recent Labs Lab 03/30/14 1026  NA 142  K 4.1  CL 106  CO2 24  GLUCOSE 108*  BUN 23  CREATININE 0.96  CALCIUM 9.0    Recent Labs Lab 03/30/14 1026  AST 15  ALT 11  ALKPHOS 78  BILITOT 0.6  PROT 6.5  ALBUMIN 3.8    Recent Labs Lab 03/30/14 1026  WBC 6.2  NEUTROABS 4.7  HGB 13.4  HCT 39.6  MCV 87.2  PLT 207    Recent Labs Lab 03/30/14 1026  TROPONINI <0.30   No results for input(s): LABPROT, INR in the last 72 hours. No results for input(s): COLORURINE, LABSPEC, [place], GLUCOSEU, HGBUR, BILIRUBINUR, KETONESUR, PROTEINUR, UROBILINOGEN, NITRITE, LEUKOCYTESUR in the last 72 hours.  Invalid input(s): APPERANCEUR     Component Value Date/Time    CHOL 147 03/31/2014 0338   TRIG 165* 03/31/2014 0338   HDL 32* 03/31/2014 0338   CHOLHDL 4.6 03/31/2014 0338   VLDL 33 03/31/2014 0338   LDLCALC 82 03/31/2014 0338   Lab Results  Component Value Date   HGBA1C 6.1* 03/31/2014   No results found for: LABOPIA, COCAINSCRNUR, LABBENZ, AMPHETMU, THCU, LABBARB  No results for input(s): ETH in the last 168 hours.   Ct Angio Head and Neck W/cm &/or Wo Cm 03/30/2014    1. Extracranial carotid arteries with no significant atherosclerotic stenosis, but tortuosity and a kinked appearance more so on the right.  2. Moderate to severe bilateral ICA siphon atherosclerotic stenosis related to bulky calcified plaque. High-grade stenosis of the right ICA anterior genu.  3. Otherwise mild anterior circulation atherosclerosis, mild right MCA M1 stenosis.  4. Overall moderate bilateral vertebral artery atherosclerosis in stenosis; moderate distal right vertebral artery and left vertebral artery origin stenoses.  5. Otherwise posterior circulation remarkable for mild to moderate left PCA atherosclerosis and stenosis.  6. Stable and negative CT appearance of the brain.     Chest 2 View 03/30/2014    Underlying emphysematous change. No edema or consolidation. No pneumothorax. Heart borderline enlarged.     Ct Head Wo Contrast 03/30/2014    1. No acute intracranial pathology.  2. Generalized cerebral atrophy and chronic microvascular disease.  Mr Brain Wo Contrast 03/30/2014    1. Several tiny acute cortical infarcts in the superior left frontal gyrus, left MCA and left ACA territories. No mass effect or hemorrhage.  2. Scattered chronic micro hemorrhages in the brain compatible with chronic small vessel disease.     2D Echocardiogram  EF 60-65% with no source of embolus.   TEE - normal LV function; negative saline microcavitation study  PHYSICAL EXAM Mental Status: Alert, oriented, thought content appropriate. Speech fluent without evidence  of aphasia. Able to follow 3 step commands without difficulty. Cranial Nerves: II: Discs flat bilaterally; Visual fields grossly normal, pupils equal, round, reactive to light and accommodation III,IV, VI: ptosis not present, extra-ocular motions intact bilaterally V,VII: smile symmetric, facial light touch sensation normal bilaterally VIII: hearing normal bilaterally IX,X: gag reflex present XI: bilateral shoulder shrug XII: midline tongue extension Motor: Strength 5/5 throughout Tone and bulk:normal tone throughout; no atrophy noted Sensory: Pinprick and light touch intact throughout, bilaterally Cerebellar: normal finger-to-nose Gait: Not tested   ASSESSMENT/PLAN [name] is a 80 y.o. male with history of hypertension, hyperlipidemia, coronary artery disease with coronary artery bypass graft surgery in 2000 presenting with generalized weakness after falling. He did not receive IV t-PA  secondary to minimal deficits.  Strokes:  Dominant infarcts involving left MCA and ACA territory - possibly embolic secondary to an unknown source.  Resultant - resolution of deficits  MRI  - as above  MRA  not ordered  CTA head and neck - moderate to severe bilateral intracranial ICA disease. < 50% R ICA stenosis  Carotid Doppler - See CTA of neck  2D Echo - EF 60-65% no clot, no shunt  TEE negative and loop recorder placed.  Loop recorder placed to rule out afib.  LDL - 82, not at the goal   HgbA1c 6.1, at the goal  Subcutaneous heparin for VTE prophylaxis  aspirin 81 mg orally every day prior to admission, now on aspirin 325 mg orally every day  Patient counseled to be compliant with his antithrombotic medications  Ongoing aggressive stroke risk factor management  Therapy recommendations:  No follow-up PT recommended  Disposition: home  Follow up with DR. Cassity Christian in 2 months (order written)  R carotid stenosis  < 50% stenosis  Needs OP follow in in 6 months to  check for any progression  Hypertension  Home meds:   Norvasc 5 mg daily, Tenormin 25 mg twice daily, Microzide 12.5 mg daily, and lisinopril 40 mg daily  Stable  Patient counseled to be compliant with his blood pressure medications  Hyperlipidemia  Home meds:  Lipitor 40 mg daily - resumed in hospital  LDL 82, goal < 70  Increased Lipitor to 80 mg daily   Continue statin at discharge  Other Stroke Risk Factors  Advanced age  Coronary artery disease  Hospital day # [address] for Pager information 04/02/2014 11:59 AM   I, the attending vascular neurologist, have personally obtained a history, examined the patient, evaluated laboratory data, individually viewed imaging studies. Together with the NP/PA, we formulated the assessment and plan of care which reflects our mutual decision.  I have made any additions or clarifications directly to the above note and agree with the findings and plan as currently documented.   This pt has cardioembolic stroke, and stroke work up so far not able to explain the stroke etiology. Pt will undergo cardiac monitoring to rule out afib/aflutter.  Pt was put on ASA and statin. If afib found during cardia monitoring, pt will need anticoagulation for stroke prevention. Due to lack of definite stroke cause, pt may have higher risk of recurrent stroke  Rosalin Hawking, MD PhD Stroke Neurology 04/02/2014 11:27 PM     To contact Stroke Continuity provider, please refer to [email or web address] After hours, contact General Neurology

## 2014-04-02 NOTE — Progress Notes (Signed)
D/C orders received, pt for D/C home today.  IV and telemetry D/C.  Rx and D/C instructions given with verbalized understanding.  Family at bedside to assist with D/C.  Staff brought pt downstairs via wheelchair.  

## 2014-04-02 NOTE — Discharge Summary (Signed)
Physician Discharge Summary  [name] EVO:[id number] DOB: 10-02-1922 DOA: 03/30/2014  PCP: Jani Gravel, MD  Admit date: 03/30/2014 Discharge date: 04/02/2014  Time spent: 30 minutes  Recommendations for Outpatient Follow-up:  1. Follow up with PCP in 1-2 weeks  Discharge Diagnoses:  Active Problems:   Coronary atherosclerosis of artery bypass graft   Essential hypertension   Hyperlipidemia   Right bundle branch block   CVA (cerebral infarction)  Discharge Condition: Stable  Diet recommendation: Heart healthy  Filed Weights   03/30/14 1821  Weight: 86.183 kg (190 lb)    History of present illness:  Please see admit h and p from 12/19 for details. Briefly, pt presents with R LE weakness. The patient was admitted for stroke work up.  Hospital Course:  1. Acute CVA 1. Involving L frontal gyrus, L MCA, L ACA territories 2. Neurology was consulted and had been following 3. 2d echo with EF 60-65%, no WMA, no PFO 4. CT neck with mod-severe B ICA disease 5. Continue on ASA and statin 6. Per Neurology, there are concerns for cardioembolic source to CVA. 7. Pt underwent TEE on 12/22 with PFO, normal EF 2. HLD 1. On statin per above, dose increased from 40mg  to 80mg  3. HTN 1. BP stable 2. Allowing permissive htn 4. Hx CAD s/p CABG 1. Currently asymptomatic and chest pain free 5. DVT prophylaxis 1. Heparin subQ  Procedures:  TEE 12/22  Consultations:  Neurology  Cardiology  Discharge Exam: Filed Vitals:   04/02/14 1555 04/02/14 1600 04/02/14 1605 04/02/14 1610  BP: 150/34 165/36 141/34   Pulse: 53 49 42 46  Temp:      TempSrc:      Resp: 15 13 15 14   Height:      Weight:      SpO2: 97% 96% 95% 97%    General: awake, in nad Cardiovascular: regular, s1, s2 Respiratory: normal resp effort, no wheezing  Discharge Instructions      Discharge Instructions    Ambulatory referral to Neurology    Complete by:  As directed   Dr. Erlinda Hong requests followup  in 2 months            Medication List    STOP taking these medications        aspirin EC 81 MG tablet  Replaced by:  aspirin 325 MG tablet      TAKE these medications        amLODipine 5 MG tablet  Commonly known as:  NORVASC  Take 1 tablet (5 mg total) by mouth daily.     aspirin 325 MG tablet  Take 1 tablet (325 mg total) by mouth daily.     atenolol 25 MG tablet  Commonly known as:  TENORMIN  Take 1 tablet (25 mg total) by mouth 2 (two) times daily.     atorvastatin 80 MG tablet  Commonly known as:  LIPITOR  Take 1 tablet (80 mg total) by mouth daily after supper.     finasteride 5 MG tablet  Commonly known as:  PROSCAR  Take 0.5 tablets (2.5 mg total) by mouth daily.     hydrochlorothiazide 12.5 MG capsule  Commonly known as:  MICROZIDE  Take 1 capsule (12.5 mg total) by mouth daily.     lisinopril 40 MG tablet  Commonly known as:  PRINIVIL,ZESTRIL  Take 1 tablet (40 mg total) by mouth daily.     metFORMIN 500 MG tablet  Commonly known as:  GLUCOPHAGE  Take  0.5 tablets (250 mg total) by mouth daily with breakfast.     omeprazole 20 MG capsule  Commonly known as:  PRILOSEC  Take 1 capsule (20 mg total) by mouth daily.     PRESERVISION AREDS 2 Caps  Take 1 capsule by mouth daily.       Allergies  Allergen Reactions  . Monosodium Glutamate Anaphylaxis   Follow-up Information    Follow up with Xu,Jindong, MD In 2 months.   Specialty:  Neurology   Why:  Stroke Clinic, Office will call you with appointment date & time   Contact information:   [address] [place] [address] [phone]       Follow up with Jani Gravel, MD. Schedule an appointment as soon as possible for a visit in 1 week.   Specialty:  Internal Medicine   Contact information:   [address] [place] [address] [phone]        The results of significant diagnostics from this hospitalization (including imaging, microbiology,  ancillary and laboratory) are listed below for reference.    Significant Diagnostic Studies: Ct Angio Head W/cm &/or Wo Cm  03/30/2014   CLINICAL DATA:  80 year old male with syncope and collapse. Initial encounter.  EXAM: CT ANGIOGRAPHY HEAD AND NECK  TECHNIQUE: Multidetector CT imaging of the head and neck was performed using the standard protocol during bolus administration of intravenous contrast. Multiplanar CT image reconstructions and MIPs were obtained to evaluate the vascular anatomy. Carotid stenosis measurements (when applicable) are obtained utilizing NASCET criteria, using the distal internal carotid diameter as the denominator.  CONTRAST:  67mL OMNIPAQUE IOHEXOL 350 MG/ML SOLN  COMPARISON:  Head CT without contrast 1056 hr the same day.  FINDINGS: CTA HEAD FINDINGS  No abnormal enhancement identified. No evidence of cortically based acute infarction identified. No midline shift, mass effect, or evidence of intracranial mass lesion.  VASCULAR FINDINGS:  Major intracranial venous structures are enhancing.  Calcified plaque and irregularity of both distal vertebral arteries, moderate stenosis on the right as the vessel enters the skull. Mild stenosis on the left. The right PICA origin remains patent. Vertebrobasilar junction is patent. Dominant appearing left AICA. No basilar artery stenosis. SCA origins are patent. Mildly irregular left PCA origin. Moderately irregular left P1 and P2 segments, with mild to moderate stenosis. Fetal type right PCA origin. Right PCA branches are within normal limits.  Extensive Calcified plaque in both ICA siphons.  High-grade stenosis at the right ICA anterior genu (series 408, image 479 and sagittal image 59). This is just proximal to the right ophthalmic artery origin which remains patent. Supra clinoid calcified plaque occurs with up to 50% stenosis. The right ICA terminus is patent.  Calcified plaque in the left ICA siphon with moderate stenosis in the anterior  genu and proximal supra clinoid segment. Left ICA terminus is patent.  MCA origins are within normal limits. The left ACA A1 segment is dominant. Normal anterior communicating artery. Visualized bilateral ACA branches are within normal limits.  Mildly to moderately irregular right MCA M1 segment, mild M1 stenosis. Right MCA branches are within normal limits. Mildly irregular left MCA M1 segment. Left MCA branches are within normal limits.  Review of the MIP images confirms the above findings.  CTA NECK FINDINGS  Negative lung apices. No superior mediastinal lymphadenopathy. Sequelae of median sternotomy. Thyroid is within normal limits for age. Evaluation of the larynx and pharynx degraded by motion. Parapharyngeal spaces, retropharyngeal space, sublingual space, submandibular  glands, parotid glands, and orbits soft tissues are within normal limits. Trace paranasal sinus mucosal thickening. Degenerative changes in the spine. No acute osseous abnormality identified. No cervical lymphadenopathy.  VASCULAR FINDINGS:  Soft and calcified plaque affecting the aortic arch. Three vessel arch configuration. No great vessel origin stenosis.  No right CCA origin stenosis. Soft and calcified plaque at the right carotid bifurcation with less than 50% stenosis. Tortuous cervical right ICA just beyond the bulb with a kinked appearance (sagittal image 33). Additional tortuosity just proximal to the skullbase with a mildly kinked appearance (coronal image 94).  No proximal right subclavian artery stenosis. Calcified plaque at the right vertebral artery origin, but no hemodynamically significant stenosis results. Mild irregularity in the V2 segment, with soft and occasionally calcified plaque. No V2 segment stenosis. Just proximal to the skullbase there is more moderate irregularity of the vessel with calcified plaque. See the intracranial findings.  No left CCA origin stenosis. Mild motion artifact at the left carotid bifurcation.  Soft and calcified plaque resulting in less than 50 % stenosis with respect to the distal vessel. Tortuous left cervical ICA distal to the bulb with additional soft and calcified plaque just below the skullbase, but no stenosis occurs. Rather the vessel appears mildly dolichoectatic.  No proximal left subclavian artery stenosis. Soft and calcified plaque at the left vertebral artery origin resulting in moderate stenosis (coronal image 126). There is also moderate stenosis of the vessel in the distal V2 segment which appears related to soft plaque (coronal image 106). The left vertebral artery remains patent to the skullbase.  Review of the MIP images confirms the above findings.  IMPRESSION: 1. Extracranial carotid arteries with no significant atherosclerotic stenosis, but tortuosity and a kinked appearance more so on the right. 2. Moderate to severe bilateral ICA siphon atherosclerotic stenosis related to bulky calcified plaque. High-grade stenosis of the right ICA anterior genu. 3. Otherwise mild anterior circulation atherosclerosis, mild right MCA M1 stenosis. 4. Overall moderate bilateral vertebral artery atherosclerosis in stenosis; moderate distal right vertebral artery and left vertebral artery origin stenoses. 5. Otherwise posterior circulation remarkable for mild to moderate left PCA atherosclerosis and stenosis. 6. Stable and negative CT appearance of the brain.   Electronically Signed   By: Lars Pinks M.D.   On: 03/30/2014 15:03   Dg Chest 2 View  03/30/2014   CLINICAL DATA:  Patient fell earlier in the day  EXAM: CHEST  2 VIEW  COMPARISON:  September 10, 2004  FINDINGS: There is underlying emphysematous change. There is slight scarring in the left base. There is no edema or consolidation. Heart is borderline enlarged. Pulmonary vascularity is within normal limits. Patient is status post coronary artery bypass grafting. Slight prominence in the superior mediastinum is stable and felt to represent great  vessel prominence in this age group. There is no adenopathy. No pneumothorax. No appreciable bone lesions.  IMPRESSION: Underlying emphysematous change. No edema or consolidation. No pneumothorax. Heart borderline enlarged.   Electronically Signed   By: Lowella Grip M.D.   On: 03/30/2014 11:28   Ct Head Wo Contrast  03/30/2014   CLINICAL DATA:  Syncopal episode, history of CABG, history of hypertension, history of right bundle-branch block  EXAM: CT HEAD WITHOUT CONTRAST  TECHNIQUE: Contiguous axial images were obtained from the base of the skull through the vertex without intravenous contrast.  COMPARISON:  None.  FINDINGS: There is no evidence of mass effect, midline shift, or extra-axial fluid collections. There is no evidence  of a space-occupying lesion or intracranial hemorrhage. There is no evidence of a cortical-based area of acute infarction. There is generalized cerebral atrophy. There is mild cerebellar atrophy. There is periventricular white matter low attenuation likely secondary to microangiopathy.  The ventricles and sulci are appropriate for the patient's age. The basal cisterns are patent.  Visualized portions of the orbits are unremarkable. The visualized portions of the paranasal sinuses and mastoid air cells are unremarkable. Cerebrovascular atherosclerotic calcifications are noted.  The osseous structures are unremarkable.  IMPRESSION: 1. No acute intracranial pathology. 2. Generalized cerebral atrophy and chronic microvascular disease.   Electronically Signed   By: Kathreen Devoid   On: 03/30/2014 11:04   Ct Angio Neck W/cm &/or Wo/cm  03/30/2014   CLINICAL DATA:  80 year old male with syncope and collapse. Initial encounter.  EXAM: CT ANGIOGRAPHY HEAD AND NECK  TECHNIQUE: Multidetector CT imaging of the head and neck was performed using the standard protocol during bolus administration of intravenous contrast. Multiplanar CT image reconstructions and MIPs were obtained to evaluate the  vascular anatomy. Carotid stenosis measurements (when applicable) are obtained utilizing NASCET criteria, using the distal internal carotid diameter as the denominator.  CONTRAST:  54mL OMNIPAQUE IOHEXOL 350 MG/ML SOLN  COMPARISON:  Head CT without contrast 1056 hr the same day.  FINDINGS: CTA HEAD FINDINGS  No abnormal enhancement identified. No evidence of cortically based acute infarction identified. No midline shift, mass effect, or evidence of intracranial mass lesion.  VASCULAR FINDINGS:  Major intracranial venous structures are enhancing.  Calcified plaque and irregularity of both distal vertebral arteries, moderate stenosis on the right as the vessel enters the skull. Mild stenosis on the left. The right PICA origin remains patent. Vertebrobasilar junction is patent. Dominant appearing left AICA. No basilar artery stenosis. SCA origins are patent. Mildly irregular left PCA origin. Moderately irregular left P1 and P2 segments, with mild to moderate stenosis. Fetal type right PCA origin. Right PCA branches are within normal limits.  Extensive Calcified plaque in both ICA siphons.  High-grade stenosis at the right ICA anterior genu (series 408, image 479 and sagittal image 59). This is just proximal to the right ophthalmic artery origin which remains patent. Supra clinoid calcified plaque occurs with up to 50% stenosis. The right ICA terminus is patent.  Calcified plaque in the left ICA siphon with moderate stenosis in the anterior genu and proximal supra clinoid segment. Left ICA terminus is patent.  MCA origins are within normal limits. The left ACA A1 segment is dominant. Normal anterior communicating artery. Visualized bilateral ACA branches are within normal limits.  Mildly to moderately irregular right MCA M1 segment, mild M1 stenosis. Right MCA branches are within normal limits. Mildly irregular left MCA M1 segment. Left MCA branches are within normal limits.  Review of the MIP images confirms the above  findings.  CTA NECK FINDINGS  Negative lung apices. No superior mediastinal lymphadenopathy. Sequelae of median sternotomy. Thyroid is within normal limits for age. Evaluation of the larynx and pharynx degraded by motion. Parapharyngeal spaces, retropharyngeal space, sublingual space, submandibular glands, parotid glands, and orbits soft tissues are within normal limits. Trace paranasal sinus mucosal thickening. Degenerative changes in the spine. No acute osseous abnormality identified. No cervical lymphadenopathy.  VASCULAR FINDINGS:  Soft and calcified plaque affecting the aortic arch. Three vessel arch configuration. No great vessel origin stenosis.  No right CCA origin stenosis. Soft and calcified plaque at the right carotid bifurcation with less than 50% stenosis. Tortuous cervical right ICA  just beyond the bulb with a kinked appearance (sagittal image 33). Additional tortuosity just proximal to the skullbase with a mildly kinked appearance (coronal image 94).  No proximal right subclavian artery stenosis. Calcified plaque at the right vertebral artery origin, but no hemodynamically significant stenosis results. Mild irregularity in the V2 segment, with soft and occasionally calcified plaque. No V2 segment stenosis. Just proximal to the skullbase there is more moderate irregularity of the vessel with calcified plaque. See the intracranial findings.  No left CCA origin stenosis. Mild motion artifact at the left carotid bifurcation. Soft and calcified plaque resulting in less than 50 % stenosis with respect to the distal vessel. Tortuous left cervical ICA distal to the bulb with additional soft and calcified plaque just below the skullbase, but no stenosis occurs. Rather the vessel appears mildly dolichoectatic.  No proximal left subclavian artery stenosis. Soft and calcified plaque at the left vertebral artery origin resulting in moderate stenosis (coronal image 126). There is also moderate stenosis of the  vessel in the distal V2 segment which appears related to soft plaque (coronal image 106). The left vertebral artery remains patent to the skullbase.  Review of the MIP images confirms the above findings.  IMPRESSION: 1. Extracranial carotid arteries with no significant atherosclerotic stenosis, but tortuosity and a kinked appearance more so on the right. 2. Moderate to severe bilateral ICA siphon atherosclerotic stenosis related to bulky calcified plaque. High-grade stenosis of the right ICA anterior genu. 3. Otherwise mild anterior circulation atherosclerosis, mild right MCA M1 stenosis. 4. Overall moderate bilateral vertebral artery atherosclerosis in stenosis; moderate distal right vertebral artery and left vertebral artery origin stenoses. 5. Otherwise posterior circulation remarkable for mild to moderate left PCA atherosclerosis and stenosis. 6. Stable and negative CT appearance of the brain.   Electronically Signed   By: Lars Pinks M.D.   On: 03/30/2014 15:03   Mr Brain Wo Contrast  03/30/2014   CLINICAL DATA:  80 year old male with weakness, fall in bathroom. No loss of consciousness. Initial encounter.  EXAM: MRI HEAD WITHOUT CONTRAST  TECHNIQUE: Multiplanar, multiecho pulse sequences of the brain and surrounding structures were obtained without intravenous contrast.  COMPARISON:  CTA head and neck 1421 hr the same day and earlier.  FINDINGS: Diffusion weighted imaging reveal several small cortically based areas of restricted diffusion in the superior left hemisphere. These include the medial left frontal lobe pre motor area (should be left ACA territory, series 4, image 34) as well as the posterior left frontal lobe closer to the motor strip (series 4, image 32).  No contralateral or posterior fossa restricted diffusion. Major intracranial vascular flow voids are preserved.  No midline shift, mass effect, evidence of mass lesion, ventriculomegaly, extra-axial collection or acute intracranial hemorrhage.  Cervicomedullary junction and pituitary are within normal limits. Negative visualized cervical spine.  There are scattered chronic micro hemorrhages in the brain, including in the cerebellum. Superimposed minimal to mild for age scattered nonspecific cerebral white matter T2 and FLAIR hyperintensity. No cortical encephalomalacia.  Visible internal auditory structures appear normal. Mastoids are clear. Trace paranasal sinus mucosal thickening. Postoperative changes to the globes. Visualized scalp soft tissues are within normal limits. Normal bone marrow signal.  IMPRESSION: 1. Several tiny acute cortical infarcts in the superior left frontal gyrus, left MCA and left ACA territories. No mass effect or hemorrhage. 2. Scattered chronic micro hemorrhages in the brain compatible with chronic small vessel disease.   Electronically Signed   By: Lezlie Octave.D.  On: 03/30/2014 17:07    Microbiology: No results found for this or any previous visit (from the past 240 hour(s)).   Labs: Basic Metabolic Panel:  Recent Labs Lab 03/30/14 1026  NA 142  K 4.1  CL 106  CO2 24  GLUCOSE 108*  BUN 23  CREATININE 0.96  CALCIUM 9.0   Liver Function Tests:  Recent Labs Lab 03/30/14 1026  AST 15  ALT 11  ALKPHOS 78  BILITOT 0.6  PROT 6.5  ALBUMIN 3.8   No results for input(s): LIPASE, AMYLASE in the last 168 hours. No results for input(s): AMMONIA in the last 168 hours. CBC:  Recent Labs Lab 03/30/14 1026  WBC 6.2  NEUTROABS 4.7  HGB 13.4  HCT 39.6  MCV 87.2  PLT 207   Cardiac Enzymes:  Recent Labs Lab 03/30/14 1026  TROPONINI <0.30   BNP: BNP (last 3 results) No results for input(s): PROBNP in the last 8760 hours. CBG: No results for input(s): GLUCAP in the last 168 hours.  Signed:  Duwane Gewirtz, Orpah Melter  [facility] 04/02/2014, 4:14 PM

## 2014-04-02 NOTE — Interval H&P Note (Signed)
History and Physical Interval Note:  04/02/2014 3:40 PM  [name]  has presented today for surgery, with the diagnosis of stroke  The various methods of treatment have been discussed with the patient and family. After consideration of risks, benefits and other options for treatment, the patient has consented to  Procedure(s): TRANSESOPHAGEAL ECHOCARDIOGRAM (TEE) (N/A) as a surgical intervention .  The patient's history has been reviewed, patient examined, no change in status, stable for surgery.  I have reviewed the patient's chart and labs.  Questions were answered to the patient's satisfaction.     Kirk Ruths

## 2014-04-03 ENCOUNTER — Telehealth: Payer: Self-pay | Admitting: Interventional Cardiology

## 2014-04-03 ENCOUNTER — Encounter (HOSPITAL_COMMUNITY): Payer: Self-pay | Admitting: Cardiology

## 2014-04-03 NOTE — Telephone Encounter (Signed)
Spoke with pt, he will see dr allred 04-15-14 @ 8a to discuss loop recorder.

## 2014-04-03 NOTE — Telephone Encounter (Signed)
New Msg    Pt just released from hospital last night due to TIA.   Pt has many questions and would like to speak with a nurse.   Please contact at [phone] which is cell phone.

## 2014-04-03 NOTE — Telephone Encounter (Signed)
Spoke with pt, he was recently discharged and he was told to increase his lipitor to 80 mg daily. The patient reports they had an old medicine list when he was admitted and he is taking pravastatin instead of lipitor because of stomach issues. Explained to patient to cont on the pravastatin. He questioned if he needed to double it, told patient to discuss that with dr Maudie Mercury, his PCP. He also states he was told he would get a loop recorder prior to discharge and he did not get one. He is calling and asking when and where he is to get the recorder. Will discuss with dr allred's nurse and call him back.

## 2014-04-09 ENCOUNTER — Encounter: Payer: Self-pay | Admitting: *Deleted

## 2014-04-09 DIAGNOSIS — I10 Essential (primary) hypertension: Secondary | ICD-10-CM | POA: Diagnosis not present

## 2014-04-09 DIAGNOSIS — E119 Type 2 diabetes mellitus without complications: Secondary | ICD-10-CM | POA: Diagnosis not present

## 2014-04-09 DIAGNOSIS — I639 Cerebral infarction, unspecified: Secondary | ICD-10-CM | POA: Diagnosis not present

## 2014-04-10 ENCOUNTER — Encounter: Payer: Self-pay | Admitting: Interventional Cardiology

## 2014-04-12 NOTE — Telephone Encounter (Signed)
He should follow instruction of those caring for him.

## 2014-04-15 ENCOUNTER — Ambulatory Visit (INDEPENDENT_AMBULATORY_CARE_PROVIDER_SITE_OTHER): Payer: Medicare Other | Admitting: Internal Medicine

## 2014-04-15 ENCOUNTER — Encounter: Payer: Self-pay | Admitting: Internal Medicine

## 2014-04-15 VITALS — BP 158/60 | HR 49 | Ht 71.0 in | Wt 192.8 lb

## 2014-04-15 DIAGNOSIS — I10 Essential (primary) hypertension: Secondary | ICD-10-CM | POA: Diagnosis not present

## 2014-04-15 DIAGNOSIS — I25700 Atherosclerosis of coronary artery bypass graft(s), unspecified, with unstable angina pectoris: Secondary | ICD-10-CM | POA: Diagnosis not present

## 2014-04-15 DIAGNOSIS — I639 Cerebral infarction, unspecified: Secondary | ICD-10-CM | POA: Diagnosis not present

## 2014-04-15 DIAGNOSIS — E785 Hyperlipidemia, unspecified: Secondary | ICD-10-CM

## 2014-04-15 NOTE — Progress Notes (Signed)
RIO 2 Informed Consent   Subject Name: [name]  Subject met inclusion and exclusion criteria. The informed consent form, study requirements and expectations were reviewed with the subject and questions and concerns were addressed prior to the signing of the consent form. The subject verbalized understanding of the trail requirements. The subject agreed to participate in the RIO 2 trial and signed the informed consent. The informed consent was obtained prior to performance of any protocol-specific procedures for the subject. A copy of the signed informed consent was given to the subject and a copy was placed in the subject's medical record.  [name] 04/15/2014 @ [phone]

## 2014-04-15 NOTE — Progress Notes (Addendum)
ELECTROPHYSIOLOGY CONSULT NOTE  Patient ID: [name] MRN: [id number], DOB/AGE: 01/06/1923   Admit date: (Not on file) Date of Consult: 04/15/2014  Primary Physician: Jani Gravel, MD Primary Cardiologist: Dr Tamala Julian Reason for Consultation: Cryptogenic stroke; recommendations regarding Implantable Loop Recorder  History of Present Illnes [name] was admitted on 03/30/14 with acute CVA.  He was found to have stroke Involving L frontal gyrus, L MCA, L ACA territories which was felt by neurology to be embolic in nature.   he was monitored on telemetry which demonstrated no arrhythmias. No cause has been identified. Inpatient stroke work-up including TEE was uneventful.   EP has been asked to evaluate for placement of an implantable loop recorder to monitor for atrial fibrillation.  Past Medical History Past Medical History  Diagnosis Date  . Right bundle branch block 12/20/2013  . Hyperlipidemia 12/20/2013  . Essential hypertension 12/20/2013  . Coronary atherosclerosis of artery bypass graft 12/20/2013    CASHD with CABG 2000 with LIMA to LAD, SV graft to diagonal, SVG to OM, and saphenous vein graft to distal RCA.       Past Surgical History Past Surgical History  Procedure Laterality Date  . Tee without cardioversion N/A 04/02/2014    Procedure: TRANSESOPHAGEAL ECHOCARDIOGRAM (TEE);  Surgeon: Lelon Perla, MD;  Location: [facility] ENDOSCOPY;  Service: Cardiovascular;  Laterality: N/A;    Allergies/Intolerances Allergies  Allergen Reactions  . Monosodium Glutamate Anaphylaxis   Inpatient Medications   Social History History   Social History  . Marital Status: Married    Spouse Name: N/A    Number of Children: N/A  . Years of Education: N/A   Occupational History  . Not on file.   Social History Main Topics  . Smoking status: Never Smoker   . Smokeless tobacco: Not on file  . Alcohol Use: No  . Drug Use: No  . Sexual Activity: Not Currently   Other Topics  Concern  . Not on file   Social History Narrative    Review of Systems General: No chills, fever, night sweats or weight changes  Cardiovascular:  No chest pain, dyspnea on exertion, edema, orthopnea, palpitations, paroxysmal nocturnal dyspnea Dermatological: No rash, lesions or masses Respiratory: No cough, dyspnea Urologic: No hematuria, dysuria Abdominal: No nausea, vomiting, diarrhea, bright red blood per rectum, melena, or hematemesis Neurologic: No visual changes, weakness, changes in mental status All other systems reviewed and are otherwise negative except as noted above.  Physical Exam Blood pressure 158/60, pulse 49, height 5\' 11"  (1.803 m), weight 192 lb 12.8 oz (87.454 kg).  General: Well developed, well appearing 81 y.o. male in no acute distress. HEENT: Normocephalic, atraumatic. EOMs intact. Sclera nonicteric. Oropharynx clear.  Neck: Supple without bruits. No JVD. Lungs: Respirations regular and unlabored, CTA bilaterally. No wheezes, rales or rhonchi. Heart: RRR. S1, S2 present. No murmurs, rub, S3 or S4. Abdomen: Soft, non-tender, non-distended. BS present x 4 quadrants. No hepatosplenomegaly.  Extremities: No clubbing, cyanosis or edema. DP/PT/Radials 2+ and equal bilaterally. Psych: Normal affect. Neuro: Alert and oriented X 3. Moves all extremities spontaneously. Musculoskeletal: No kyphosis. Skin: Intact. Warm and dry. No rashes or petechiae in exposed areas.   Labs Lab Results  Component Value Date   WBC 6.2 03/30/2014   HGB 13.4 03/30/2014   HCT 39.6 03/30/2014   MCV 87.2 03/30/2014   PLT 207 03/30/2014   No results for input(s): NA, K, CL, CO2, BUN, CREATININE, CALCIUM, PROT, BILITOT, ALKPHOS, ALT, AST, GLUCOSE  in the last 168 hours.  Invalid input(s): LABALBU No results for input(s): INR in the last 72 hours.  Radiology/Studies Ct Angio Head W/cm &/or Wo Cm  03/30/2014   CLINICAL DATA:  81 year old male with syncope and collapse. Initial  encounter.  EXAM: CT ANGIOGRAPHY HEAD AND NECK  TECHNIQUE: Multidetector CT imaging of the head and neck was performed using the standard protocol during bolus administration of intravenous contrast. Multiplanar CT image reconstructions and MIPs were obtained to evaluate the vascular anatomy. Carotid stenosis measurements (when applicable) are obtained utilizing NASCET criteria, using the distal internal carotid diameter as the denominator.  CONTRAST:  74mL OMNIPAQUE IOHEXOL 350 MG/ML SOLN  COMPARISON:  Head CT without contrast 1056 hr the same day.  FINDINGS: CTA HEAD FINDINGS  No abnormal enhancement identified. No evidence of cortically based acute infarction identified. No midline shift, mass effect, or evidence of intracranial mass lesion.  VASCULAR FINDINGS:  Major intracranial venous structures are enhancing.  Calcified plaque and irregularity of both distal vertebral arteries, moderate stenosis on the right as the vessel enters the skull. Mild stenosis on the left. The right PICA origin remains patent. Vertebrobasilar junction is patent. Dominant appearing left AICA. No basilar artery stenosis. SCA origins are patent. Mildly irregular left PCA origin. Moderately irregular left P1 and P2 segments, with mild to moderate stenosis. Fetal type right PCA origin. Right PCA branches are within normal limits.  Extensive Calcified plaque in both ICA siphons.  High-grade stenosis at the right ICA anterior genu (series 408, image 479 and sagittal image 59). This is just proximal to the right ophthalmic artery origin which remains patent. Supra clinoid calcified plaque occurs with up to 50% stenosis. The right ICA terminus is patent.  Calcified plaque in the left ICA siphon with moderate stenosis in the anterior genu and proximal supra clinoid segment. Left ICA terminus is patent.  MCA origins are within normal limits. The left ACA A1 segment is dominant. Normal anterior communicating artery. Visualized bilateral ACA  branches are within normal limits.  Mildly to moderately irregular right MCA M1 segment, mild M1 stenosis. Right MCA branches are within normal limits. Mildly irregular left MCA M1 segment. Left MCA branches are within normal limits.  Review of the MIP images confirms the above findings.  CTA NECK FINDINGS  Negative lung apices. No superior mediastinal lymphadenopathy. Sequelae of median sternotomy. Thyroid is within normal limits for age. Evaluation of the larynx and pharynx degraded by motion. Parapharyngeal spaces, retropharyngeal space, sublingual space, submandibular glands, parotid glands, and orbits soft tissues are within normal limits. Trace paranasal sinus mucosal thickening. Degenerative changes in the spine. No acute osseous abnormality identified. No cervical lymphadenopathy.  VASCULAR FINDINGS:  Soft and calcified plaque affecting the aortic arch. Three vessel arch configuration. No great vessel origin stenosis.  No right CCA origin stenosis. Soft and calcified plaque at the right carotid bifurcation with less than 50% stenosis. Tortuous cervical right ICA just beyond the bulb with a kinked appearance (sagittal image 33). Additional tortuosity just proximal to the skullbase with a mildly kinked appearance (coronal image 94).  No proximal right subclavian artery stenosis. Calcified plaque at the right vertebral artery origin, but no hemodynamically significant stenosis results. Mild irregularity in the V2 segment, with soft and occasionally calcified plaque. No V2 segment stenosis. Just proximal to the skullbase there is more moderate irregularity of the vessel with calcified plaque. See the intracranial findings.  No left CCA origin stenosis. Mild motion artifact at the left carotid bifurcation.  Soft and calcified plaque resulting in less than 50 % stenosis with respect to the distal vessel. Tortuous left cervical ICA distal to the bulb with additional soft and calcified plaque just below the  skullbase, but no stenosis occurs. Rather the vessel appears mildly dolichoectatic.  No proximal left subclavian artery stenosis. Soft and calcified plaque at the left vertebral artery origin resulting in moderate stenosis (coronal image 126). There is also moderate stenosis of the vessel in the distal V2 segment which appears related to soft plaque (coronal image 106). The left vertebral artery remains patent to the skullbase.  Review of the MIP images confirms the above findings.  IMPRESSION: 1. Extracranial carotid arteries with no significant atherosclerotic stenosis, but tortuosity and a kinked appearance more so on the right. 2. Moderate to severe bilateral ICA siphon atherosclerotic stenosis related to bulky calcified plaque. High-grade stenosis of the right ICA anterior genu. 3. Otherwise mild anterior circulation atherosclerosis, mild right MCA M1 stenosis. 4. Overall moderate bilateral vertebral artery atherosclerosis in stenosis; moderate distal right vertebral artery and left vertebral artery origin stenoses. 5. Otherwise posterior circulation remarkable for mild to moderate left PCA atherosclerosis and stenosis. 6. Stable and negative CT appearance of the brain.   Electronically Signed   By: Lars Pinks M.D.   On: 03/30/2014 15:03   Dg Chest 2 View  03/30/2014   CLINICAL DATA:  Patient fell earlier in the day  EXAM: CHEST  2 VIEW  COMPARISON:  September 10, 2004  FINDINGS: There is underlying emphysematous change. There is slight scarring in the left base. There is no edema or consolidation. Heart is borderline enlarged. Pulmonary vascularity is within normal limits. Patient is status post coronary artery bypass grafting. Slight prominence in the superior mediastinum is stable and felt to represent great vessel prominence in this age group. There is no adenopathy. No pneumothorax. No appreciable bone lesions.  IMPRESSION: Underlying emphysematous change. No edema or consolidation. No pneumothorax. Heart  borderline enlarged.   Electronically Signed   By: Lowella Grip M.D.   On: 03/30/2014 11:28   Ct Head Wo Contrast  03/30/2014   CLINICAL DATA:  Syncopal episode, history of CABG, history of hypertension, history of right bundle-branch block  EXAM: CT HEAD WITHOUT CONTRAST  TECHNIQUE: Contiguous axial images were obtained from the base of the skull through the vertex without intravenous contrast.  COMPARISON:  None.  FINDINGS: There is no evidence of mass effect, midline shift, or extra-axial fluid collections. There is no evidence of a space-occupying lesion or intracranial hemorrhage. There is no evidence of a cortical-based area of acute infarction. There is generalized cerebral atrophy. There is mild cerebellar atrophy. There is periventricular white matter low attenuation likely secondary to microangiopathy.  The ventricles and sulci are appropriate for the patient's age. The basal cisterns are patent.  Visualized portions of the orbits are unremarkable. The visualized portions of the paranasal sinuses and mastoid air cells are unremarkable. Cerebrovascular atherosclerotic calcifications are noted.  The osseous structures are unremarkable.  IMPRESSION: 1. No acute intracranial pathology. 2. Generalized cerebral atrophy and chronic microvascular disease.   Electronically Signed   By: Kathreen Devoid   On: 03/30/2014 11:04   Ct Angio Neck W/cm &/or Wo/cm  03/30/2014   CLINICAL DATA:  81 year old male with syncope and collapse. Initial encounter.  EXAM: CT ANGIOGRAPHY HEAD AND NECK  TECHNIQUE: Multidetector CT imaging of the head and neck was performed using the standard protocol during bolus administration of intravenous contrast. Multiplanar CT  image reconstructions and MIPs were obtained to evaluate the vascular anatomy. Carotid stenosis measurements (when applicable) are obtained utilizing NASCET criteria, using the distal internal carotid diameter as the denominator.  CONTRAST:  16mL OMNIPAQUE  IOHEXOL 350 MG/ML SOLN  COMPARISON:  Head CT without contrast 1056 hr the same day.  FINDINGS: CTA HEAD FINDINGS  No abnormal enhancement identified. No evidence of cortically based acute infarction identified. No midline shift, mass effect, or evidence of intracranial mass lesion.  VASCULAR FINDINGS:  Major intracranial venous structures are enhancing.  Calcified plaque and irregularity of both distal vertebral arteries, moderate stenosis on the right as the vessel enters the skull. Mild stenosis on the left. The right PICA origin remains patent. Vertebrobasilar junction is patent. Dominant appearing left AICA. No basilar artery stenosis. SCA origins are patent. Mildly irregular left PCA origin. Moderately irregular left P1 and P2 segments, with mild to moderate stenosis. Fetal type right PCA origin. Right PCA branches are within normal limits.  Extensive Calcified plaque in both ICA siphons.  High-grade stenosis at the right ICA anterior genu (series 408, image 479 and sagittal image 59). This is just proximal to the right ophthalmic artery origin which remains patent. Supra clinoid calcified plaque occurs with up to 50% stenosis. The right ICA terminus is patent.  Calcified plaque in the left ICA siphon with moderate stenosis in the anterior genu and proximal supra clinoid segment. Left ICA terminus is patent.  MCA origins are within normal limits. The left ACA A1 segment is dominant. Normal anterior communicating artery. Visualized bilateral ACA branches are within normal limits.  Mildly to moderately irregular right MCA M1 segment, mild M1 stenosis. Right MCA branches are within normal limits. Mildly irregular left MCA M1 segment. Left MCA branches are within normal limits.  Review of the MIP images confirms the above findings.  CTA NECK FINDINGS  Negative lung apices. No superior mediastinal lymphadenopathy. Sequelae of median sternotomy. Thyroid is within normal limits for age. Evaluation of the larynx and  pharynx degraded by motion. Parapharyngeal spaces, retropharyngeal space, sublingual space, submandibular glands, parotid glands, and orbits soft tissues are within normal limits. Trace paranasal sinus mucosal thickening. Degenerative changes in the spine. No acute osseous abnormality identified. No cervical lymphadenopathy.  VASCULAR FINDINGS:  Soft and calcified plaque affecting the aortic arch. Three vessel arch configuration. No great vessel origin stenosis.  No right CCA origin stenosis. Soft and calcified plaque at the right carotid bifurcation with less than 50% stenosis. Tortuous cervical right ICA just beyond the bulb with a kinked appearance (sagittal image 33). Additional tortuosity just proximal to the skullbase with a mildly kinked appearance (coronal image 94).  No proximal right subclavian artery stenosis. Calcified plaque at the right vertebral artery origin, but no hemodynamically significant stenosis results. Mild irregularity in the V2 segment, with soft and occasionally calcified plaque. No V2 segment stenosis. Just proximal to the skullbase there is more moderate irregularity of the vessel with calcified plaque. See the intracranial findings.  No left CCA origin stenosis. Mild motion artifact at the left carotid bifurcation. Soft and calcified plaque resulting in less than 50 % stenosis with respect to the distal vessel. Tortuous left cervical ICA distal to the bulb with additional soft and calcified plaque just below the skullbase, but no stenosis occurs. Rather the vessel appears mildly dolichoectatic.  No proximal left subclavian artery stenosis. Soft and calcified plaque at the left vertebral artery origin resulting in moderate stenosis (coronal image 126). There is also moderate stenosis  of the vessel in the distal V2 segment which appears related to soft plaque (coronal image 106). The left vertebral artery remains patent to the skullbase.  Review of the MIP images confirms the above  findings.  IMPRESSION: 1. Extracranial carotid arteries with no significant atherosclerotic stenosis, but tortuosity and a kinked appearance more so on the right. 2. Moderate to severe bilateral ICA siphon atherosclerotic stenosis related to bulky calcified plaque. High-grade stenosis of the right ICA anterior genu. 3. Otherwise mild anterior circulation atherosclerosis, mild right MCA M1 stenosis. 4. Overall moderate bilateral vertebral artery atherosclerosis in stenosis; moderate distal right vertebral artery and left vertebral artery origin stenoses. 5. Otherwise posterior circulation remarkable for mild to moderate left PCA atherosclerosis and stenosis. 6. Stable and negative CT appearance of the brain.   Electronically Signed   By: Lars Pinks M.D.   On: 03/30/2014 15:03   Mr Brain Wo Contrast  03/30/2014   CLINICAL DATA:  81 year old male with weakness, fall in bathroom. No loss of consciousness. Initial encounter.  EXAM: MRI HEAD WITHOUT CONTRAST  TECHNIQUE: Multiplanar, multiecho pulse sequences of the brain and surrounding structures were obtained without intravenous contrast.  COMPARISON:  CTA head and neck 1421 hr the same day and earlier.  FINDINGS: Diffusion weighted imaging reveal several small cortically based areas of restricted diffusion in the superior left hemisphere. These include the medial left frontal lobe pre motor area (should be left ACA territory, series 4, image 34) as well as the posterior left frontal lobe closer to the motor strip (series 4, image 32).  No contralateral or posterior fossa restricted diffusion. Major intracranial vascular flow voids are preserved.  No midline shift, mass effect, evidence of mass lesion, ventriculomegaly, extra-axial collection or acute intracranial hemorrhage. Cervicomedullary junction and pituitary are within normal limits. Negative visualized cervical spine.  There are scattered chronic micro hemorrhages in the brain, including in the cerebellum.  Superimposed minimal to mild for age scattered nonspecific cerebral white matter T2 and FLAIR hyperintensity. No cortical encephalomalacia.  Visible internal auditory structures appear normal. Mastoids are clear. Trace paranasal sinus mucosal thickening. Postoperative changes to the globes. Visualized scalp soft tissues are within normal limits. Normal bone marrow signal.  IMPRESSION: 1. Several tiny acute cortical infarcts in the superior left frontal gyrus, left MCA and left ACA territories. No mass effect or hemorrhage. 2. Scattered chronic micro hemorrhages in the brain compatible with chronic small vessel disease.   Electronically Signed   By: Lars Pinks M.D.   On: 03/30/2014 17:07    Echocardiogram  Reviewed- normal EF  12-lead ECG sinus Telemetry sinus in hospital   Assessment and Plan 1. Cryptogenic stroke The patient has been documented to have an embolic stroke.  I spoke at length with the patient and his son about monitoring for afib with either a 30 day event monitor or an implantable loop recorder.  Risks, benefits, and alteratives to implantable loop recorder were discussed with the patient today.   At this time, the patient is very clear in their decision to proceed with implantable loop recorder.   2. HTN Stable No change required today  3. CAD Stable No change required today  Thompson Grayer MD 04/15/2014 9:00 am   Addendum for ILR implant (RIO II protocol)  SURGEON:  Thompson Grayer, MD     PREPROCEDURE DIAGNOSIS:  Cryptogenic Stroke    POSTPROCEDURE DIAGNOSIS:  Cryptogenic Stroke     PROCEDURES:   1. Implantable loop recorder implantation    INTRODUCTION:  [name] is a 81 y.o. male with a history of unexplained stroke who presents today for implantable loop implantation.  The patient has had a cryptogenic stroke.  Despite an extensive workup by neurology, no reversible causes have been identified.  he has worn telemetry during which he did not have arrhythmias.   There is significant concern for possible atrial fibrillation as the cause for the patients stroke.  The patient therefore presents today for implantable loop implantation.     DESCRIPTION OF PROCEDURE:  Informed written consent was obtained, and the patient was brought to the electrophysiology lab in a fasting state.  The patient required no sedation for the procedure today.  Mapping over the patient's chest was performed by the EP lab staff to identify the area where electrograms were most prominent for ILR recording.  This area was found to be the left parasternal region over the 3rd-4th intercostal space. The patients left chest was therefore prepped and draped in the usual sterile fashion by the EP lab staff. The skin overlying the left parasternal region was infiltrated with lidocaine for local analgesia.  A 0.5-cm incision was made over the left parasternal region over the 3rd intercostal space.  A subcutaneous ILR pocket was fashioned using a combination of sharp and blunt dissection.  A Medtronic Reveal Arthurdale model [id number] implantable loop recorder was then placed into the pocket  R waves were very prominent and measured 0.33mV. EBL<1 ml.  Steri- Strips and a sterile dressing were then applied.  There were no early apparent complications.     CONCLUSIONS:   1. Successful implantation of a Medtronic Reveal LINQ implantable loop recorder for cryptogenic stroke  2. No early apparent complications.    Thompson Grayer MD 04/15/2014 10:00 AM

## 2014-04-15 NOTE — Patient Instructions (Signed)
Your physician recommends that you schedule a follow-up appointment in: 10 days- device clinic for a wound check.  Your physician recommends that you continue on your current medications as directed. Please refer to the Current Medication list given to you today.

## 2014-04-18 ENCOUNTER — Other Ambulatory Visit: Payer: Self-pay | Admitting: Interventional Cardiology

## 2014-04-18 ENCOUNTER — Telehealth: Payer: Self-pay | Admitting: Interventional Cardiology

## 2014-04-18 NOTE — Telephone Encounter (Signed)
Pt calling wanting to speak with Dr Tamala Julian and CMA about coming in for a follow-up OV at Dr Thompson Caul next available. Pt states he was recently discharged from the hospital with a stroke and on 04/15/14 he had a loop recorder implanted by Dr Rayann Heman in the office on for cryptogenic stroke.  Pt has no cardiac complaints at this time.  Pt states he really would just like for Dr Tamala Julian to call him, or schedule an OV with him for reassurance and evaluation post-hospital.  Pt states he has been seeing Dr Tamala Julian for years, and is very upset that there has been no follow-up OV set up with him since being discharged from the hospital.  Informed the pt that Dr Tamala Julian and CMA are both out of the office today, but I will route this message to the both of them for further review, recommendation, and follow-up.  Advised the pt to call early morning to the office and ask to speak with Dr Thompson Caul CMA, for they will be working then.  Pt verbalized understanding and agrees with this plan.

## 2014-04-18 NOTE — Telephone Encounter (Signed)
New Message  Pt called to make appt with Dr. Tamala Julian; will not accept that next avail is March 3. Requests to speak with Rn. Please call back and discuss.

## 2014-04-18 NOTE — Telephone Encounter (Signed)
I have been following his course remotely and aware that Loop was placed. This will help exclude the need for stronger meds.

## 2014-04-19 ENCOUNTER — Telehealth: Payer: Self-pay | Admitting: Interventional Cardiology

## 2014-04-19 NOTE — Telephone Encounter (Signed)
Pt adv that Dr.Smith has been following his course remotely and aware that Loop was placed. Pt aware of appt of work in appt scheduled with Dr.Smith on 1/13 @ 9am

## 2014-04-19 NOTE — Telephone Encounter (Signed)
New Msg            Pt calling, no details given. Would like a call back.

## 2014-04-24 ENCOUNTER — Ambulatory Visit (INDEPENDENT_AMBULATORY_CARE_PROVIDER_SITE_OTHER): Payer: Medicare Other | Admitting: Interventional Cardiology

## 2014-04-24 ENCOUNTER — Encounter: Payer: Self-pay | Admitting: Interventional Cardiology

## 2014-04-24 VITALS — BP 130/70 | HR 59 | Ht 71.0 in | Wt 191.0 lb

## 2014-04-24 DIAGNOSIS — I4510 Unspecified right bundle-branch block: Secondary | ICD-10-CM

## 2014-04-24 DIAGNOSIS — I63132 Cerebral infarction due to embolism of left carotid artery: Secondary | ICD-10-CM

## 2014-04-24 DIAGNOSIS — I10 Essential (primary) hypertension: Secondary | ICD-10-CM | POA: Diagnosis not present

## 2014-04-24 DIAGNOSIS — E785 Hyperlipidemia, unspecified: Secondary | ICD-10-CM

## 2014-04-24 DIAGNOSIS — I25810 Atherosclerosis of coronary artery bypass graft(s) without angina pectoris: Secondary | ICD-10-CM

## 2014-04-24 NOTE — Progress Notes (Signed)
Patient ID: MARSALIS BEAULIEU, male   DOB: May 22, 1922, 81 y.o.   MRN: [id number]    [address] [place], [place]  [address] Phone: [phone] Fax:  [phone]  Date:  04/24/2014   ID:  CHARAN PRIETO, DOB [birth year], MRN [id number]  PCP:  Jani Gravel, MD   ASSESSMENT:  1. Recent posterior small vessel stroke 2. Coronary atherosclerosis, asymptomatic. Prior coronary bypass grafting 2000 3. Hyperlipidemia 4. Essential hypertension 5. Right bundle branch block  PLAN:   1. Loop recorder has already been implanted and will be monitored over its lifespan for evidence of any embologenic rhythms 2. Over a long conversation with multiple questions answered, I reiterated that he should not drive until cleared by neurology. Also reinforced the absence of further investigation and treatment options. 3. Increase physical activity   SUBJECTIVE: [name] is a 81 y.o. male who developed right sided weakness. He was hospitalized and evaluated. He was found to have evidence of posterior left brain lacunar infarcts. It was felt to be related to small vessel disease. No arrhythmias were identified during the hospital stay. He's had no recurrent symptoms since being at home. He was advised not to drive and this concerned/devastated by this. He denies angina. There is no shortness of breath. No difficulty on aspirin.   Wt Readings from Last 3 Encounters:  04/24/14 191 lb (86.637 kg)  04/15/14 192 lb 12.8 oz (87.454 kg)  03/30/14 190 lb (86.183 kg)     Past Medical History  Diagnosis Date  . Right bundle branch block 12/20/2013  . Hyperlipidemia 12/20/2013  . Essential hypertension 12/20/2013  . Coronary atherosclerosis of artery bypass graft 12/20/2013    CASHD with CABG 2000 with LIMA to LAD, SV graft to diagonal, SVG to OM, and saphenous vein graft to distal RCA.       Current Outpatient Prescriptions  Medication Sig Dispense Refill  . amLODipine (NORVASC) 5 MG tablet TAKE  1 TABLET BY MOUTH EVERY DAY 90 tablet 3  . aspirin 325 MG tablet Take 1 tablet (325 mg total) by mouth daily. 30 tablet 0  . atenolol (TENORMIN) 25 MG tablet Take 1 tablet (25 mg total) by mouth 2 (two) times daily. (Patient taking differently: Take 25 mg by mouth daily. )    . ezetimibe (ZETIA) 10 MG tablet Take 5 mg by mouth daily.    . finasteride (PROSCAR) 5 MG tablet Take 0.5 tablets (2.5 mg total) by mouth daily.    . hydrochlorothiazide (HYDRODIURIL) 25 MG tablet Take 12.5 mg by mouth daily.    Marland Kitchen lisinopril (PRINIVIL,ZESTRIL) 40 MG tablet Take 1 tablet (40 mg total) by mouth daily.    . metFORMIN (GLUCOPHAGE) 500 MG tablet Take 0.5 tablets (250 mg total) by mouth daily with breakfast.    . Multiple Vitamin (MULTI VITAMIN DAILY PO) Take 1 tablet by mouth daily.    . Multiple Vitamins-Minerals (PRESERVISION AREDS 2) CAPS Take 1 capsule by mouth daily. (Patient taking differently: Take 1 capsule by mouth 2 (two) times daily. )    . omeprazole (PRILOSEC) 20 MG capsule Take 1 capsule (20 mg total) by mouth daily.    . pravastatin (PRAVACHOL) 40 MG tablet Take 40 mg by mouth daily.     No current facility-administered medications for this visit.    Allergies:    Allergies  Allergen Reactions  . Monosodium Glutamate Anaphylaxis    Social History:  The patient  reports that he has never  smoked. He does not have any smokeless tobacco history on file. He reports that he does not drink alcohol or use illicit drugs.   ROS:  Please see the history of present illness.   Has not had gait abnormality or difficulty with ambulation. Appetite is been stable.   All other systems reviewed and negative.   OBJECTIVE: VS:  BP 130/70 mmHg  Pulse 59  Ht 5\' 11"  (1.803 m)  Wt 191 lb (86.637 kg)  BMI 26.65 kg/m2 Well nourished, well developed, in no acute distress, elderly. Accompanied by his son. HEENT: normal Neck: JVD flat. Carotid bruit absent  Cardiac:  normal S1, S2; RRR; no murmur Lungs:  clear  to auscultation bilaterally, no wheezing, rhonchi or rales Abd: soft, nontender, no hepatomegaly Ext: Edema absent. Pulses 2+ Skin: warm and dry Neuro:  CNs 2-12 intact, no focal abnormalities noted  EKG:  Not repeated       Signed, Illene Labrador III, MD 04/24/2014 9:29 AM

## 2014-04-24 NOTE — Patient Instructions (Signed)
Your physician recommends that you continue on your current medications as directed. Please refer to the Current Medication list given to you today.  Your physician recommends that you schedule a follow-up appointment as planned in October 2016

## 2014-04-29 ENCOUNTER — Ambulatory Visit: Payer: Medicare Other | Admitting: *Deleted

## 2014-04-29 DIAGNOSIS — I63132 Cerebral infarction due to embolism of left carotid artery: Secondary | ICD-10-CM

## 2014-04-29 LAB — MDC_IDC_ENUM_SESS_TYPE_INCLINIC
MDC IDC SESS DTM: 20160118112354
MDC IDC SET ZONE DETECTION INTERVAL: 2000 ms
Zone Setting Detection Interval: 3000 ms
Zone Setting Detection Interval: 430 ms

## 2014-04-29 NOTE — Progress Notes (Signed)
Wound check in clinic s/p ILR implant. Wound well healed without redness or edema. Pt with 0 tachy episodes; 0 brady episodes; 0 asystole; 0 AF episodes; 0 symptom episodes. Plan to follow up via [facility] QMO and with JA PRN.

## 2014-04-30 DIAGNOSIS — H3532 Exudative age-related macular degeneration: Secondary | ICD-10-CM | POA: Diagnosis not present

## 2014-05-01 ENCOUNTER — Telehealth: Payer: Self-pay | Admitting: Neurology

## 2014-05-01 NOTE — Telephone Encounter (Signed)
Called pt and discussed with him over the phone. He got loop recorder recently and wound healed well. He recently saw his ophthalmologist Dr. Oval Linsey for macular degeneration with avastin injection. Dr. Oval Linsey has concerns due to his recent stroke. She wants to talk to me before offering pt injection again. I called Dr. Blenda Mounts office and learned that she is only in office on Tuesdays and Thursdays. I left my cell phone number to her CMA and let Dr. Oval Linsey call me tomorrow afternoon.   Also discussed with pt about his driving. I do not have restrictions for his driving, but I advised him to ride with somebody for 2-3 times and if both parties comfortable for the driving, then he can drive along later. Pt expressed understanding and appreciation.  Also discussed with pt RESPECT ESUS trial and he wants more information. I will let Rizwan know about it.  Rosalin Hawking, MD PhD Stroke Neurology 05/01/2014 4:18 PM

## 2014-05-01 NOTE — Telephone Encounter (Signed)
Pt is calling stating he has been getting shots in his eye from the [facility].  He has already had one, the doctor put the second shot off until she hears from Dr. Erlinda Hong.  The patient will be dropping off a letter from Dr. Oval Linsey for you.   He has an appointment in March.  Please call and advise pt or Dr. Oval Linsey once you get the letter.

## 2014-05-02 NOTE — Telephone Encounter (Signed)
Discussed with Dr. Oval Linsey over the phone and she stated that avastin injection in the eye has rare report of stroke or MI risk. She also consulted other neurologists, they either recommended no change of the treatment plan or wait 2-3 weeks before next injection. I'm okay with continued injection by holding for 2-3 weeks. She concurred. I have called pt and let him know. He expressed understanding and appreciation.  I also discussed with Dr. Oval Linsey about patient potential candidacy for RESPECT ESUS trial. She stated that some data concern for macular bleeding in macular degeneration patient while on anticoagulation. Therefore, if he is a potential candidate for the trial, he has to understand the risk for macular bleeding.  Rosalin Hawking, MD PhD Stroke Neurology 05/02/2014 12:27 PM

## 2014-05-07 DIAGNOSIS — E118 Type 2 diabetes mellitus with unspecified complications: Secondary | ICD-10-CM | POA: Diagnosis not present

## 2014-05-07 DIAGNOSIS — E780 Pure hypercholesterolemia: Secondary | ICD-10-CM | POA: Diagnosis not present

## 2014-05-07 DIAGNOSIS — I10 Essential (primary) hypertension: Secondary | ICD-10-CM | POA: Diagnosis not present

## 2014-05-09 DIAGNOSIS — I639 Cerebral infarction, unspecified: Secondary | ICD-10-CM | POA: Diagnosis not present

## 2014-05-09 DIAGNOSIS — E785 Hyperlipidemia, unspecified: Secondary | ICD-10-CM | POA: Diagnosis not present

## 2014-05-09 DIAGNOSIS — I10 Essential (primary) hypertension: Secondary | ICD-10-CM | POA: Diagnosis not present

## 2014-05-13 ENCOUNTER — Encounter: Payer: Self-pay | Admitting: Internal Medicine

## 2014-05-14 DIAGNOSIS — N401 Enlarged prostate with lower urinary tract symptoms: Secondary | ICD-10-CM | POA: Diagnosis not present

## 2014-05-14 DIAGNOSIS — N402 Nodular prostate without lower urinary tract symptoms: Secondary | ICD-10-CM | POA: Diagnosis not present

## 2014-05-14 DIAGNOSIS — R3916 Straining to void: Secondary | ICD-10-CM | POA: Diagnosis not present

## 2014-05-15 ENCOUNTER — Ambulatory Visit: Payer: Medicare Other

## 2014-05-15 ENCOUNTER — Ambulatory Visit (INDEPENDENT_AMBULATORY_CARE_PROVIDER_SITE_OTHER): Payer: Medicare Other | Admitting: *Deleted

## 2014-05-15 DIAGNOSIS — I63132 Cerebral infarction due to embolism of left carotid artery: Secondary | ICD-10-CM

## 2014-05-17 NOTE — Progress Notes (Signed)
Loop recorder 

## 2014-05-28 DIAGNOSIS — H3532 Exudative age-related macular degeneration: Secondary | ICD-10-CM | POA: Diagnosis not present

## 2014-05-28 LAB — MDC_IDC_ENUM_SESS_TYPE_REMOTE

## 2014-06-12 ENCOUNTER — Ambulatory Visit (INDEPENDENT_AMBULATORY_CARE_PROVIDER_SITE_OTHER): Payer: Medicare Other | Admitting: *Deleted

## 2014-06-12 DIAGNOSIS — I639 Cerebral infarction, unspecified: Secondary | ICD-10-CM

## 2014-06-12 LAB — MDC_IDC_ENUM_SESS_TYPE_INCLINIC
MDC IDC SESS DTM: 20160302145931
MDC IDC SET ZONE DETECTION INTERVAL: 3000 ms
Zone Setting Detection Interval: 2000 ms
Zone Setting Detection Interval: 430 ms

## 2014-06-12 NOTE — Progress Notes (Signed)
[place] II check in clinic.  Pt with 0 tachy episodes; 0 brady episodes; 2 pauses.  Episodes occurred while pt was sleeping. 1 episode shows noise w/ undersensing; other episode shows true pause. [facility] summary reports QMO and ROV w/ Dr. Rayann Heman PRN.

## 2014-06-14 ENCOUNTER — Ambulatory Visit (INDEPENDENT_AMBULATORY_CARE_PROVIDER_SITE_OTHER): Payer: Medicare Other | Admitting: *Deleted

## 2014-06-14 ENCOUNTER — Encounter: Payer: Self-pay | Admitting: Internal Medicine

## 2014-06-14 DIAGNOSIS — I639 Cerebral infarction, unspecified: Secondary | ICD-10-CM

## 2014-06-17 ENCOUNTER — Encounter: Payer: Self-pay | Admitting: Neurology

## 2014-06-17 ENCOUNTER — Ambulatory Visit (INDEPENDENT_AMBULATORY_CARE_PROVIDER_SITE_OTHER): Payer: Medicare Other | Admitting: Neurology

## 2014-06-17 VITALS — BP 159/60 | HR 53 | Ht 71.25 in | Wt 190.8 lb

## 2014-06-17 DIAGNOSIS — I10 Essential (primary) hypertension: Secondary | ICD-10-CM | POA: Diagnosis not present

## 2014-06-17 DIAGNOSIS — I25709 Atherosclerosis of coronary artery bypass graft(s), unspecified, with unspecified angina pectoris: Secondary | ICD-10-CM | POA: Insufficient documentation

## 2014-06-17 DIAGNOSIS — E785 Hyperlipidemia, unspecified: Secondary | ICD-10-CM | POA: Diagnosis not present

## 2014-06-17 DIAGNOSIS — I63412 Cerebral infarction due to embolism of left middle cerebral artery: Secondary | ICD-10-CM

## 2014-06-17 NOTE — Progress Notes (Signed)
STROKE NEUROLOGY FOLLOW UP NOTE  NAME: [name] DOB: [birth year]  REASON FOR VISIT: stroke follow up HISTORY FROM: pt and chart  Today we had the pleasure of seeing [name] in follow-up at our Neurology Clinic. Pt was accompanied by no one.   History Summary [name] is a 81 y.o. male with history of hypertension, hyperlipidemia, coronary artery disease with coronary artery bypass graft surgery in 2000 was admitted on 03/30/14 with generalized weakness after falling. He did not lose consciousness. No speech abnormalities were noted. He finally got some help to get to the bed and EMS was called.MRI showed punctate left MCA and ACA territory infarcts, embolic pattern. CTA also showed moderate to severe bilateral ICA siphon stenosis with calcified plaques as well as diffuse athero of intrancranial vessels. TTE and TEE unremarkable. His symptoms resolved and he was discharged with ASA and pravastatin.   Interval History During the interval time, the patient has been doing well. His PCP changed him from ASA to plavix. No recurrent strokes. His Bp in clinic today 156/60. He stated that at home his BP normally 130/60. He has avastin injection for macular degeneration. He also enrolled into a study and had loop recorder placed at office setting as an outpt. So far no afib episodes found.   REVIEW OF SYSTEMS: Full 14 system review of systems performed and notable only for those listed below and in HPI above, all others are negative:  Constitutional:   Cardiovascular:  Ear/Nose/Throat:   Skin:  Eyes:   Respiratory:   Gastroitestinal:   Genitourinary:  Hematology/Lymphatic:   Endocrine:  Musculoskeletal:   Allergy/Immunology:  allergies Neurological:   Psychiatric:  Sleep:   The following represents the patient's updated allergies and side effects list: Allergies  Allergen Reactions  . Monosodium Glutamate Anaphylaxis    The neurologically relevant items on  the patient's problem list were reviewed on today's visit.  Neurologic Examination  A problem focused neurological exam (12 or more points of the single system neurologic examination, vital signs counts as 1 point, cranial nerves count for 8 points) was performed.  Blood pressure 159/60, pulse 53, height 5' 11.25" (1.81 m), weight 190 lb 12.8 oz (86.546 kg).  General - Well nourished, well developed, in no apparent distress.  Ophthalmologic - fundi not visualized due to small pupils.  Cardiovascular - Regular rate and rhythm with no murmur.  Mental Status -  Level of arousal and orientation to time, place, and person were intact. Language including expression, naming, repetition, comprehension was assessed and found intact.  Cranial Nerves II - XII - II - Visual field intact OU III, IV, VI - Extraocular movements intact. V - Facial sensation intact bilaterally. VII - Facial movement intact bilaterally. VIII - Hearing & vestibular intact bilaterally. X - Palate elevates symmetrically. XI - Chin turning & shoulder shrug intact bilaterally. XII - Tongue protrusion intact.  Motor Strength - The patient's strength was normal in all extremities and pronator drift was absent.  Bulk was normal and fasciculations were absent.   Motor Tone - Muscle tone was assessed at the neck and appendages and was normal.  Reflexes - The patient's reflexes were normal in all extremities and he had no pathological reflexes.  Sensory - Light touch, temperature/pinprick, vibration and proprioception, and Romberg testing were assessed and were normal.    Coordination - The patient had normal movements in the hands and feet with no ataxia or dysmetria.  Tremor was absent.  Gait and Station - mildly slow but steady without tendency to fall.  Data reviewed: I personally reviewed the images and agree with the radiology interpretations.  Ct Angio Head and Neck W/cm &/or Wo Cm 03/30/2014  1.  Extracranial carotid arteries with no significant atherosclerotic stenosis, but tortuosity and a kinked appearance more so on the right.  2. Moderate to severe bilateral ICA siphon atherosclerotic stenosis related to bulky calcified plaque. High-grade stenosis of the right ICA anterior genu.  3. Otherwise mild anterior circulation atherosclerosis, mild right MCA M1 stenosis.  4. Overall moderate bilateral vertebral artery atherosclerosis in stenosis; moderate distal right vertebral artery and left vertebral artery origin stenoses.  5. Otherwise posterior circulation remarkable for mild to moderate left PCA atherosclerosis and stenosis.  6. Stable and negative CT appearance of the brain.   Chest 2 View 03/30/2014  Underlying emphysematous change. No edema or consolidation. No pneumothorax. Heart borderline enlarged.   Ct Head Wo Velez 03/30/2014  1. No acute intracranial pathology.  2. Generalized cerebral atrophy and chronic microvascular disease.   [name] 03/30/2014  1. Several tiny acute cortical infarcts in the superior left frontal gyrus, left MCA and left ACA territories. No mass effect or hemorrhage.  2. Scattered chronic micro hemorrhages in the brain compatible with chronic small vessel disease.   2D Echocardiogram EF 60-65% with no source of embolus.   TEE - normal LV function; negative saline microcavitation study  Component     Latest Ref Rng 03/31/2014  Cholesterol     0 - 200 mg/dL 147  Triglycerides     <150 mg/dL 165 (H)  HDL     >39 mg/dL 32 (L)  Total CHOL/HDL Ratio      4.6  VLDL     0 - 40 mg/dL 33  LDL (calc)     0 - 99 mg/dL 82  Hemoglobin A1C     <5.7 % 6.1 (H)  Mean Plasma Glucose     <117 mg/dL 128 (H)    Assessment: As you may recall, he is a 81 y.o. Caucasian male with PMH of HTN, HLD, CAD with CABG in 2000 was admitted on 03/30/14 for punctate left MCA and ACA territory infarcts, embolic pattern  arrhythmia vs. Large vessel asthro. CTA also showed moderate to severe bilateral ICA siphon stenosis with calcified plaques as well as diffuse athero of intrancranial vessels. TTE and TEE unremarkable. His symptoms resolved and he was discharged with ASA and pravastatin. He has avastin injection for macular degeneration. He also enrolled into a study and had loop recorder placed at office setting as an outpt. So far no afib episodes found. He is not a RESPECT ESUS candidate due to severe stenosis of intracranial vessels, avastin injection into eyes, as well as he has enrolled to another study with loop recorder. His ASA was switched to [email or web address] PCP.    Plan:  - continue plavix and pravastatin for stroke prevention - continue zetia - TCD emboli detection to rule out any emboli - follow up with cardiologist and ophthalmology - Follow up with your primary care physician for stroke risk factor modification. Recommend maintain blood pressure goal <130/80, diabetes with hemoglobin A1c goal below 6.5% and lipids with LDL cholesterol goal below 70 mg/dL.  - check BP at home - RTC in 3 months   Orders Placed This Encounter  Procedures  . [place] TCD WITHMONITORING    Standing Status: Future     Number of Occurrences:  Standing Expiration Date: 12/18/2014    Order Specific Question:  Reason for Exam (SYMPTOM  OR DIAGNOSIS REQUIRED)    Answer:  embolic stroke    Order Specific Question:  Preferred imaging location?    Answer:  Internal    No orders of the defined types were placed in this encounter.    Patient Instructions  - continue plavix and pravastatin for stroke prevention - continue zetia for HLD - will do TCD emboli detection to rule out any emboli - continue follow up cardiology for loop recorder - continue to follow up with eye doctor - Follow up with your primary care physician for stroke risk factor modification. Recommend maintain blood pressure goal <130/80, diabetes with hemoglobin  A1c goal below 6.5% and lipids with LDL cholesterol goal below 70 mg/dL.  - check BP at exercise center 3/week - follow up in 3 months   Rosalin Hawking, MD PhD [facility] [address] [place], [place] [address] [phone]

## 2014-06-17 NOTE — Patient Instructions (Addendum)
-   continue plavix and pravastatin for stroke prevention - continue zetia for HLD - will do TCD emboli detection to rule out any emboli - continue follow up cardiology for loop recorder - continue to follow up with eye doctor - Follow up with your primary care physician for stroke risk factor modification. Recommend maintain blood pressure goal <130/80, diabetes with hemoglobin A1c goal below 6.5% and lipids with LDL cholesterol goal below 70 mg/dL.  - check BP at exercise center 3/week - follow up in 3 months

## 2014-06-18 NOTE — Progress Notes (Signed)
Loop recorder 

## 2014-06-25 ENCOUNTER — Ambulatory Visit (INDEPENDENT_AMBULATORY_CARE_PROVIDER_SITE_OTHER): Payer: Medicare Other

## 2014-06-25 DIAGNOSIS — I63412 Cerebral infarction due to embolism of left middle cerebral artery: Secondary | ICD-10-CM | POA: Diagnosis not present

## 2014-06-28 ENCOUNTER — Encounter: Payer: Self-pay | Admitting: Internal Medicine

## 2014-07-02 LAB — MDC_IDC_ENUM_SESS_TYPE_REMOTE

## 2014-07-03 ENCOUNTER — Telehealth: Payer: Self-pay | Admitting: Neurology

## 2014-07-03 NOTE — Telephone Encounter (Signed)
Patient requesting results for Emboli [place].  Please call and advise.

## 2014-07-05 ENCOUNTER — Encounter: Payer: Self-pay | Admitting: Neurology

## 2014-07-05 ENCOUNTER — Other Ambulatory Visit: Payer: Self-pay | Admitting: Neurology

## 2014-07-05 DIAGNOSIS — I63412 Cerebral infarction due to embolism of left middle cerebral artery: Secondary | ICD-10-CM

## 2014-07-05 MED ORDER — ATORVASTATIN CALCIUM 80 MG PO TABS
80.0000 mg | ORAL_TABLET | Freq: Every day | ORAL | Status: DC
Start: 2014-07-05 — End: 2014-10-30

## 2014-07-05 MED ORDER — ASPIRIN EC 81 MG PO TBEC: 81.0000 mg | DELAYED_RELEASE_TABLET | Freq: Every day | ORAL | Status: DC

## 2014-07-05 NOTE — Telephone Encounter (Signed)
I have talked with pt and delivered the result to him.   Called pt regarding his TCD emboli detection result which showed 4 emboli detected in the left MCA. No right temporal window so that right MCA not monitored. On his CTA head and neck study, and it showed there is extensive calcified plaques in the proximal and supraglenoid left ICA. It is most likely the emboli comes from the calcified plaques on the left ICA. However, due to lack of a temporal window, right ICA was not monitored, it is hard to conclude the emboli signal exclusively from the left side. Therefore, she regularly cardioembolic cannot be excluded at this time.   I have recommended patient to start dural antiplatelet for at least 3 months, switch pravastatin to high-dose Lipitor, and repeat TCD in 3 months, and try to also monitor right ICA from left temporal window at that time. Patient requested this information sent to his my chart and also sent to his PCP Dr. Maudie Mercury. Aspirin and Lipitor prescribed.  Rosalin Hawking, MD PhD Stroke Neurology 07/05/2014 6:08 PM

## 2014-07-05 NOTE — Telephone Encounter (Signed)
Spoke to patient and informed him that results were not ready yet, patient stated that it has been 2 weeks and doesn't understand why it takes so long. Informed patient that message will be forwarded to Dr Erlinda Hong for results.

## 2014-07-05 NOTE — Progress Notes (Signed)
Called pt regarding his TCD emboli detection result which showed 4 emboli detected in the left MCA. No right temporal window so that right MCA not monitored. On his CTA head and neck study, and it showed there is extensive calcified plaques in the proximal and supraglenoid left ICA. It is most likely the emboli comes from the calcified plaques on the left ICA. However, due to lack of a temporal window, right ICA was not monitored, it is hard to conclude the emboli signal exclusively from the left side. Therefore, she regularly cardioembolic cannot be excluded at this time.   I have recommended patient to start dural antiplatelet for at least 3 months, switch pravastatin to high-dose Lipitor, and repeat TCD in 3 months, and try to also monitor right ICA from left temporal window at that time. Patient requested this information sent to his my chart and also sent to his PCP Dr. Maudie Mercury. Aspirin and Lipitor prescribed.  Rosalin Hawking, MD PhD Stroke Neurology 07/05/2014 5:58 PM

## 2014-07-09 DIAGNOSIS — H3532 Exudative age-related macular degeneration: Secondary | ICD-10-CM | POA: Diagnosis not present

## 2014-07-09 NOTE — Telephone Encounter (Signed)
Routed to Dr. Kim.

## 2014-07-15 ENCOUNTER — Ambulatory Visit (INDEPENDENT_AMBULATORY_CARE_PROVIDER_SITE_OTHER): Payer: Medicare Other | Admitting: *Deleted

## 2014-07-15 ENCOUNTER — Encounter: Payer: Self-pay | Admitting: Internal Medicine

## 2014-07-15 DIAGNOSIS — I639 Cerebral infarction, unspecified: Secondary | ICD-10-CM | POA: Diagnosis not present

## 2014-07-15 LAB — MDC_IDC_ENUM_SESS_TYPE_REMOTE: Date Time Interrogation Session: 20160426060500

## 2014-07-16 NOTE — Progress Notes (Signed)
Loop recorder 

## 2014-07-17 ENCOUNTER — Encounter: Payer: Self-pay | Admitting: Internal Medicine

## 2014-07-22 ENCOUNTER — Ambulatory Visit (INDEPENDENT_AMBULATORY_CARE_PROVIDER_SITE_OTHER): Payer: Medicare Other | Admitting: *Deleted

## 2014-07-22 DIAGNOSIS — I639 Cerebral infarction, unspecified: Secondary | ICD-10-CM

## 2014-07-22 LAB — MDC_IDC_ENUM_SESS_TYPE_INCLINIC

## 2014-07-22 NOTE — Progress Notes (Signed)
LINQ check in clinic by industry and research. No episodes. ROV with JA in 12 months. Monthly [facility] summary reports.

## 2014-07-31 ENCOUNTER — Encounter: Payer: Self-pay | Admitting: Internal Medicine

## 2014-08-05 DIAGNOSIS — E559 Vitamin D deficiency, unspecified: Secondary | ICD-10-CM | POA: Diagnosis not present

## 2014-08-05 DIAGNOSIS — I10 Essential (primary) hypertension: Secondary | ICD-10-CM | POA: Diagnosis not present

## 2014-08-08 DIAGNOSIS — I10 Essential (primary) hypertension: Secondary | ICD-10-CM | POA: Diagnosis not present

## 2014-08-08 DIAGNOSIS — I639 Cerebral infarction, unspecified: Secondary | ICD-10-CM | POA: Diagnosis not present

## 2014-08-08 DIAGNOSIS — E119 Type 2 diabetes mellitus without complications: Secondary | ICD-10-CM | POA: Diagnosis not present

## 2014-08-08 DIAGNOSIS — E780 Pure hypercholesterolemia: Secondary | ICD-10-CM | POA: Diagnosis not present

## 2014-08-13 ENCOUNTER — Ambulatory Visit (INDEPENDENT_AMBULATORY_CARE_PROVIDER_SITE_OTHER): Payer: Medicare Other | Admitting: *Deleted

## 2014-08-13 DIAGNOSIS — I639 Cerebral infarction, unspecified: Secondary | ICD-10-CM

## 2014-08-13 DIAGNOSIS — H3532 Exudative age-related macular degeneration: Secondary | ICD-10-CM | POA: Diagnosis not present

## 2014-08-15 ENCOUNTER — Telehealth: Payer: Self-pay | Admitting: Internal Medicine

## 2014-08-15 NOTE — Telephone Encounter (Signed)
New Message  Pt called states that he has reciecved a message via mychart indicating a signal was received. He is confused. Request a call back to discuss why he was schedule for a remote check that he didn't think he even needed. Please call

## 2014-08-15 NOTE — Telephone Encounter (Signed)
I explained to pt his home remote is automatic. It is necessary to actively monitor due to his CVA diagnosis. Pt is aware to disregard MyChart notifications only if they apply to his loop recorder. Pt is aware he will likely get monthly notifications. He voiced understanding to disregard only those notifications, not others.

## 2014-08-20 ENCOUNTER — Encounter: Payer: Self-pay | Admitting: Internal Medicine

## 2014-08-23 ENCOUNTER — Encounter: Payer: Self-pay | Admitting: Internal Medicine

## 2014-08-23 NOTE — Progress Notes (Signed)
Loop recorder 

## 2014-08-28 LAB — CUP PACEART REMOTE DEVICE CHECK: MDC IDC SESS DTM: 20160518135511

## 2014-09-12 ENCOUNTER — Ambulatory Visit (INDEPENDENT_AMBULATORY_CARE_PROVIDER_SITE_OTHER): Payer: Medicare Other | Admitting: *Deleted

## 2014-09-12 ENCOUNTER — Encounter: Payer: Self-pay | Admitting: Internal Medicine

## 2014-09-12 DIAGNOSIS — I639 Cerebral infarction, unspecified: Secondary | ICD-10-CM | POA: Diagnosis not present

## 2014-09-13 NOTE — Progress Notes (Signed)
Loop recorder 

## 2014-09-17 DIAGNOSIS — H3532 Exudative age-related macular degeneration: Secondary | ICD-10-CM | POA: Diagnosis not present

## 2014-09-17 DIAGNOSIS — H3531 Nonexudative age-related macular degeneration: Secondary | ICD-10-CM | POA: Diagnosis not present

## 2014-09-23 DIAGNOSIS — Z961 Presence of intraocular lens: Secondary | ICD-10-CM | POA: Diagnosis not present

## 2014-09-23 DIAGNOSIS — H26492 Other secondary cataract, left eye: Secondary | ICD-10-CM | POA: Diagnosis not present

## 2014-09-26 LAB — CUP PACEART REMOTE DEVICE CHECK: MDC IDC SESS DTM: 20160616113130

## 2014-10-07 ENCOUNTER — Other Ambulatory Visit: Payer: Self-pay

## 2014-10-08 ENCOUNTER — Encounter: Payer: Self-pay | Admitting: Internal Medicine

## 2014-10-08 DIAGNOSIS — H26492 Other secondary cataract, left eye: Secondary | ICD-10-CM | POA: Diagnosis not present

## 2014-10-08 DIAGNOSIS — H2640 Unspecified secondary cataract: Secondary | ICD-10-CM | POA: Diagnosis not present

## 2014-10-11 ENCOUNTER — Ambulatory Visit (INDEPENDENT_AMBULATORY_CARE_PROVIDER_SITE_OTHER): Payer: Medicare Other | Admitting: *Deleted

## 2014-10-11 DIAGNOSIS — I639 Cerebral infarction, unspecified: Secondary | ICD-10-CM | POA: Diagnosis not present

## 2014-10-16 LAB — CUP PACEART REMOTE DEVICE CHECK: Date Time Interrogation Session: 20160706161153

## 2014-10-16 NOTE — Progress Notes (Signed)
Loop recorder 

## 2014-10-22 ENCOUNTER — Ambulatory Visit (INDEPENDENT_AMBULATORY_CARE_PROVIDER_SITE_OTHER): Payer: Medicare Other | Admitting: Neurology

## 2014-10-22 ENCOUNTER — Encounter: Payer: Self-pay | Admitting: Neurology

## 2014-10-22 VITALS — BP 139/54 | HR 49 | Resp 16 | Ht 71.25 in | Wt 186.0 lb

## 2014-10-22 DIAGNOSIS — I10 Essential (primary) hypertension: Secondary | ICD-10-CM | POA: Diagnosis not present

## 2014-10-22 DIAGNOSIS — I63412 Cerebral infarction due to embolism of left middle cerebral artery: Secondary | ICD-10-CM

## 2014-10-22 DIAGNOSIS — E785 Hyperlipidemia, unspecified: Secondary | ICD-10-CM | POA: Diagnosis not present

## 2014-10-22 DIAGNOSIS — I25709 Atherosclerosis of coronary artery bypass graft(s), unspecified, with unspecified angina pectoris: Secondary | ICD-10-CM

## 2014-10-22 NOTE — Patient Instructions (Signed)
-   continue ASA and plavix and lipitor for stroke and cardiac prevention - continue zetia for HLD - continue follow up cardiology for loop recorder - Follow up with your primary care physician for stroke risk factor modification. Recommend maintain blood pressure goal <130/80, diabetes with hemoglobin A1c goal below 6.5% and lipids with LDL cholesterol goal below 70 mg/dL.  - check BP at home and also check BP when you feel dizzy. If your BP is low that may mean your BP meds are taking all together making BP low. If that is the case, we recommend to space out BP meds over the day. - follow up in 6 months

## 2014-10-25 DIAGNOSIS — I63412 Cerebral infarction due to embolism of left middle cerebral artery: Secondary | ICD-10-CM | POA: Insufficient documentation

## 2014-10-25 NOTE — Progress Notes (Signed)
STROKE NEUROLOGY FOLLOW UP NOTE  NAME: [name] DOB: 20-Apr-1922  REASON FOR VISIT: stroke follow up HISTORY FROM: pt and chart  Today we had the pleasure of seeing [name] in follow-up at our Neurology Clinic. Pt was accompanied by no one.   History Summary [name] is a 81 y.o. male with history of hypertension, hyperlipidemia, coronary artery disease with coronary artery bypass graft surgery in 2000 was admitted on 03/30/14 with generalized weakness after falling. He did not lose consciousness. No speech abnormalities were noted. He finally got some help to get to the bed and EMS was called.MRI showed punctate left MCA and ACA territory infarcts, embolic pattern. CTA also showed moderate to severe bilateral ICA siphon stenosis with calcified plaques as well as diffuse athero of intrancranial vessels. TTE and TEE unremarkable. His symptoms resolved and he was discharged with ASA and pravastatin.   06/17/14 follow up - the patient has been doing well. His PCP changed him from ASA to plavix. No recurrent strokes. His Bp in clinic today 156/60. He stated that at home his BP normally 130/60. He has avastin injection for macular degeneration. He also enrolled into a study and had loop recorder placed at office setting as an outpt. So far no afib episodes found.   Interval History During the interval time, the patient has been doing well. No recurrent stroke like symptoms. Finished off avastin injection for macular degeneration and vision is better. Loop recorder showed no afib so far. Currently on ASA and plavix. BP today 139/54. However, he stated that around mid morning, he feels some dizziness but at lunch time, he is OK. He is taking 4 BP meds all in the morning, which could be explanation.   REVIEW OF SYSTEMS: Full 14 system review of systems performed and notable only for those listed below and in HPI above, all others are negative:  Constitutional:     Cardiovascular:  Ear/Nose/Throat:   Skin: Wound Eyes:   Respiratory:   Gastroitestinal:   Genitourinary:  Hematology/Lymphatic:   Endocrine:  Musculoskeletal:   Allergy/Immunology:   Neurological:  Dizziness Psychiatric:  Sleep: Frequent waking  The following represents the patient's updated allergies and side effects list: Allergies  Allergen Reactions  . Monosodium Glutamate Anaphylaxis    The neurologically relevant items on the patient's problem list were reviewed on today's visit.  Neurologic Examination  A problem focused neurological exam (12 or more points of the single system neurologic examination, vital signs counts as 1 point, cranial nerves count for 8 points) was performed.  Blood pressure 139/54, pulse 49, resp. rate 16, height 5' 11.25" (1.81 m), weight 186 lb (84.369 kg).  General - Well nourished, well developed, in no apparent distress.  Ophthalmologic - fundi not visualized due to small pupils.  Cardiovascular - Regular rate and rhythm with no murmur.  Mental Status -  Level of arousal and orientation to time, place, and person were intact. Language including expression, naming, repetition, comprehension was assessed and found intact.  Cranial Nerves II - XII - II - Visual field intact OU III, IV, VI - Extraocular movements intact. V - Facial sensation intact bilaterally. VII - Facial movement intact bilaterally. VIII - Hearing & vestibular intact bilaterally. X - Palate elevates symmetrically. XI - Chin turning & shoulder shrug intact bilaterally. XII - Tongue protrusion intact.  Motor Strength - The patient's strength was normal in all extremities and pronator drift was absent.  Bulk was normal and  fasciculations were absent.   Motor Tone - Muscle tone was assessed at the neck and appendages and was normal.  Reflexes - The patient's reflexes were normal in all extremities and he had no pathological reflexes.  Sensory - Light touch,  temperature/pinprick, vibration and proprioception, and Romberg testing were assessed and were normal.    Coordination - The patient had normal movements in the hands and feet with no ataxia or dysmetria.  Tremor was absent.  Gait and Station - mildly slow but steady without tendency to fall.  Data reviewed: I personally reviewed the images and agree with the radiology interpretations.  Ct Angio Head and Neck W/cm &/or Wo Cm 03/30/2014  1. Extracranial carotid arteries with no significant atherosclerotic stenosis, but tortuosity and a kinked appearance more so on the right.  2. Moderate to severe bilateral ICA siphon atherosclerotic stenosis related to bulky calcified plaque. High-grade stenosis of the right ICA anterior genu.  3. Otherwise mild anterior circulation atherosclerosis, mild right MCA M1 stenosis.  4. Overall moderate bilateral vertebral artery atherosclerosis in stenosis; moderate distal right vertebral artery and left vertebral artery origin stenoses.  5. Otherwise posterior circulation remarkable for mild to moderate left PCA atherosclerosis and stenosis.  6. Stable and negative CT appearance of the brain.   Chest 2 View 03/30/2014  Underlying emphysematous change. No edema or consolidation. No pneumothorax. Heart borderline enlarged.   Ct Head Wo Contrast 03/30/2014  1. No acute intracranial pathology.  2. Generalized cerebral atrophy and chronic microvascular disease.   Mr Brain Wo Contrast 03/30/2014  1. Several tiny acute cortical infarcts in the superior left frontal gyrus, left MCA and left ACA territories. No mass effect or hemorrhage.  2. Scattered chronic micro hemorrhages in the brain compatible with chronic small vessel disease.   2D Echocardiogram EF 60-65% with no source of embolus.   TEE - normal LV function; negative saline microcavitation study  Component     Latest Ref Rng 03/31/2014  Cholesterol     0 - 200 mg/dL 147    Triglycerides     <150 mg/dL 165 (H)  HDL     >39 mg/dL 32 (L)  Total CHOL/HDL Ratio      4.6  VLDL     0 - 40 mg/dL 33  LDL (calc)     0 - 99 mg/dL 82  Hemoglobin A1C     <5.7 % 6.1 (H)  Mean Plasma Glucose     <117 mg/dL 128 (H)    Assessment: As you may recall, he is a 81 y.o. Caucasian male with PMH of HTN, HLD, CAD with CABG in 2000 was admitted on 03/30/14 for punctate left MCA and ACA territory infarcts, embolic pattern arrhythmia vs. Large vessel asthro. CTA also showed moderate to severe bilateral ICA siphon stenosis with calcified plaques as well as diffuse athero of intrancranial vessels. TTE and TEE unremarkable. His symptoms resolved and he was discharged with ASA and pravastatin. He has avastin injection for macular degeneration. He also enrolled into a study and had loop recorder placed at office setting as an outpt. So far no afib episodes found. He is not a RESPECT ESUS candidate due to severe stenosis of intracranial vessels, avastin injection into eyes, as well as he has enrolled to another study with loop recorder. Currently on dural antiplatelet. BP is under control, but complains of mid morning dizziness, did not check BP at that time. Feels good at lunch time. Likely to be due to take 4  different BP meds all at morning.    Plan:  - continue ASA and plavix and lipitor for stroke and cardiac prevention - continue zetia for HLD - continue follow up cardiology for loop recorder - Follow up with your primary care physician for stroke risk factor modification. Recommend maintain blood pressure goal <130/80, diabetes with hemoglobin A1c goal below 6.5% and lipids with LDL cholesterol goal below 70 mg/dL.  - check BP at home and also check BP when he feels dizzy. May consider space out BP meds in the am and pm. - RTC in 6 months  I spent more than 25 minutes of face to face time with the patient. Greater than 50% of time was spent in counseling and coordination of care. We  have discussed about BP monitoring, space out BP meds and its relation to his dizziness.    No orders of the defined types were placed in this encounter.    Meds ordered this encounter  Medications  . pantoprazole (PROTONIX) 40 MG tablet    Sig: Take 40 mg by mouth daily.    Patient Instructions  - continue ASA and plavix and lipitor for stroke and cardiac prevention - continue zetia for HLD - continue follow up cardiology for loop recorder - Follow up with your primary care physician for stroke risk factor modification. Recommend maintain blood pressure goal <130/80, diabetes with hemoglobin A1c goal below 6.5% and lipids with LDL cholesterol goal below 70 mg/dL.  - check BP at home and also check BP when you feel dizzy. If your BP is low that may mean your BP meds are taking all together making BP low. If that is the case, we recommend to space out BP meds over the day. - follow up in 6 months    Rosalin Hawking, MD PhD [facility] [address], [place], [place] [address] [phone]

## 2014-10-26 ENCOUNTER — Other Ambulatory Visit: Payer: Self-pay | Admitting: Neurology

## 2014-10-30 ENCOUNTER — Encounter: Payer: Self-pay | Admitting: Internal Medicine

## 2014-10-30 ENCOUNTER — Other Ambulatory Visit: Payer: Self-pay

## 2014-10-30 DIAGNOSIS — I63412 Cerebral infarction due to embolism of left middle cerebral artery: Secondary | ICD-10-CM

## 2014-10-30 NOTE — Telephone Encounter (Signed)
Patient is requesting a refill on Lipitor.  Last OV note says he is taking Zetia.  I spoke with the patient, and he says he is taking both meds.  Okay to refill?  Thank you.

## 2014-10-30 NOTE — Telephone Encounter (Signed)
Patient is calling to get a refill for medication atorvastatin (LIPITOR) 80 MG tablet. Patient states [facility] on [address] @ [address] called for a refill but has not heard back from our office. Thank you.

## 2014-10-31 MED ORDER — ATORVASTATIN CALCIUM 80 MG PO TABS: 80.0000 mg | ORAL_TABLET | Freq: Every day | ORAL | Status: DC

## 2014-11-04 DIAGNOSIS — I10 Essential (primary) hypertension: Secondary | ICD-10-CM | POA: Diagnosis not present

## 2014-11-04 DIAGNOSIS — I639 Cerebral infarction, unspecified: Secondary | ICD-10-CM | POA: Diagnosis not present

## 2014-11-04 DIAGNOSIS — E119 Type 2 diabetes mellitus without complications: Secondary | ICD-10-CM | POA: Diagnosis not present

## 2014-11-07 DIAGNOSIS — H3532 Exudative age-related macular degeneration: Secondary | ICD-10-CM | POA: Diagnosis not present

## 2014-11-07 DIAGNOSIS — I10 Essential (primary) hypertension: Secondary | ICD-10-CM | POA: Diagnosis not present

## 2014-11-07 DIAGNOSIS — E119 Type 2 diabetes mellitus without complications: Secondary | ICD-10-CM | POA: Diagnosis not present

## 2014-11-07 DIAGNOSIS — E785 Hyperlipidemia, unspecified: Secondary | ICD-10-CM | POA: Diagnosis not present

## 2014-11-11 ENCOUNTER — Ambulatory Visit (INDEPENDENT_AMBULATORY_CARE_PROVIDER_SITE_OTHER): Payer: Medicare Other | Admitting: *Deleted

## 2014-11-11 DIAGNOSIS — I639 Cerebral infarction, unspecified: Secondary | ICD-10-CM

## 2014-11-13 NOTE — Progress Notes (Signed)
Loop recorder 

## 2014-11-21 LAB — CUP PACEART REMOTE DEVICE CHECK: MDC IDC SESS DTM: 20160811114733

## 2014-11-26 DIAGNOSIS — R3916 Straining to void: Secondary | ICD-10-CM | POA: Diagnosis not present

## 2014-11-26 DIAGNOSIS — R972 Elevated prostate specific antigen [PSA]: Secondary | ICD-10-CM | POA: Diagnosis not present

## 2014-11-26 DIAGNOSIS — N401 Enlarged prostate with lower urinary tract symptoms: Secondary | ICD-10-CM | POA: Diagnosis not present

## 2014-11-26 DIAGNOSIS — N402 Nodular prostate without lower urinary tract symptoms: Secondary | ICD-10-CM | POA: Diagnosis not present

## 2014-11-28 DIAGNOSIS — I10 Essential (primary) hypertension: Secondary | ICD-10-CM | POA: Diagnosis not present

## 2014-11-29 ENCOUNTER — Encounter: Payer: Self-pay | Admitting: Internal Medicine

## 2014-12-04 ENCOUNTER — Encounter (HOSPITAL_COMMUNITY): Payer: Self-pay | Admitting: *Deleted

## 2014-12-04 ENCOUNTER — Observation Stay (HOSPITAL_COMMUNITY)
Admission: EM | Admit: 2014-12-04 | Discharge: 2014-12-05 | Disposition: A | Discharge status: Home / Self Care | Payer: Medicare Other | Attending: Internal Medicine | Admitting: Internal Medicine

## 2014-12-04 ENCOUNTER — Emergency Department (HOSPITAL_COMMUNITY): Payer: Medicare Other

## 2014-12-04 DIAGNOSIS — M940 Chondrocostal junction syndrome [Tietze]: Secondary | ICD-10-CM | POA: Diagnosis present

## 2014-12-04 DIAGNOSIS — I10 Essential (primary) hypertension: Secondary | ICD-10-CM | POA: Diagnosis not present

## 2014-12-04 DIAGNOSIS — Z951 Presence of aortocoronary bypass graft: Secondary | ICD-10-CM | POA: Insufficient documentation

## 2014-12-04 DIAGNOSIS — I209 Angina pectoris, unspecified: Secondary | ICD-10-CM

## 2014-12-04 DIAGNOSIS — I2510 Atherosclerotic heart disease of native coronary artery without angina pectoris: Secondary | ICD-10-CM | POA: Diagnosis present

## 2014-12-04 DIAGNOSIS — M549 Dorsalgia, unspecified: Secondary | ICD-10-CM | POA: Diagnosis not present

## 2014-12-04 DIAGNOSIS — N400 Enlarged prostate without lower urinary tract symptoms: Secondary | ICD-10-CM | POA: Diagnosis not present

## 2014-12-04 DIAGNOSIS — K219 Gastro-esophageal reflux disease without esophagitis: Secondary | ICD-10-CM | POA: Diagnosis not present

## 2014-12-04 DIAGNOSIS — R0789 Other chest pain: Principal | ICD-10-CM | POA: Insufficient documentation

## 2014-12-04 DIAGNOSIS — R079 Chest pain, unspecified: Secondary | ICD-10-CM

## 2014-12-04 DIAGNOSIS — E785 Hyperlipidemia, unspecified: Secondary | ICD-10-CM | POA: Diagnosis not present

## 2014-12-04 DIAGNOSIS — I4510 Unspecified right bundle-branch block: Secondary | ICD-10-CM | POA: Insufficient documentation

## 2014-12-04 DIAGNOSIS — I5030 Unspecified diastolic (congestive) heart failure: Secondary | ICD-10-CM | POA: Diagnosis not present

## 2014-12-04 DIAGNOSIS — M546 Pain in thoracic spine: Secondary | ICD-10-CM | POA: Diagnosis not present

## 2014-12-04 DIAGNOSIS — I25709 Atherosclerosis of coronary artery bypass graft(s), unspecified, with unspecified angina pectoris: Secondary | ICD-10-CM | POA: Diagnosis present

## 2014-12-04 DIAGNOSIS — I639 Cerebral infarction, unspecified: Secondary | ICD-10-CM | POA: Diagnosis present

## 2014-12-04 DIAGNOSIS — I25119 Atherosclerotic heart disease of native coronary artery with unspecified angina pectoris: Secondary | ICD-10-CM | POA: Insufficient documentation

## 2014-12-04 DIAGNOSIS — Z7982 Long term (current) use of aspirin: Secondary | ICD-10-CM | POA: Insufficient documentation

## 2014-12-04 DIAGNOSIS — Z7902 Long term (current) use of antithrombotics/antiplatelets: Secondary | ICD-10-CM | POA: Insufficient documentation

## 2014-12-04 DIAGNOSIS — I517 Cardiomegaly: Secondary | ICD-10-CM | POA: Diagnosis not present

## 2014-12-04 DIAGNOSIS — J449 Chronic obstructive pulmonary disease, unspecified: Secondary | ICD-10-CM | POA: Diagnosis not present

## 2014-12-04 DIAGNOSIS — Z8673 Personal history of transient ischemic attack (TIA), and cerebral infarction without residual deficits: Secondary | ICD-10-CM | POA: Insufficient documentation

## 2014-12-04 DIAGNOSIS — E119 Type 2 diabetes mellitus without complications: Secondary | ICD-10-CM

## 2014-12-04 DIAGNOSIS — M47814 Spondylosis without myelopathy or radiculopathy, thoracic region: Secondary | ICD-10-CM | POA: Diagnosis not present

## 2014-12-04 DIAGNOSIS — I25810 Atherosclerosis of coronary artery bypass graft(s) without angina pectoris: Secondary | ICD-10-CM | POA: Diagnosis present

## 2014-12-04 HISTORY — DX: Gastro-esophageal reflux disease without esophagitis: K21.9

## 2014-12-04 HISTORY — DX: Cerebral infarction, unspecified: I63.9

## 2014-12-04 HISTORY — DX: Benign prostatic hyperplasia without lower urinary tract symptoms: N40.0

## 2014-12-04 LAB — BASIC METABOLIC PANEL
ANION GAP: 8 (ref 5–15)
BUN: 25 mg/dL — ABNORMAL HIGH (ref 6–20)
CHLORIDE: 105 mmol/L (ref 101–111)
CO2: 25 mmol/L (ref 22–32)
Calcium: 8.8 mg/dL — ABNORMAL LOW (ref 8.9–10.3)
Creatinine, Ser: 1.19 mg/dL (ref 0.61–1.24)
GFR calc non Af Amer: 52 mL/min — ABNORMAL LOW (ref >60)
Glucose, Bld: 96 mg/dL (ref 65–99)
POTASSIUM: 4.3 mmol/L (ref 3.5–5.1)
Sodium: 138 mmol/L (ref 135–145)

## 2014-12-04 LAB — I-STAT TROPONIN, ED: TROPONIN I, POC: 0.01 ng/mL (ref 0.00–0.08)

## 2014-12-04 LAB — PROTIME-INR
INR: 1.18 (ref 0.00–1.49)
PROTHROMBIN TIME: 15.2 s (ref 11.6–15.2)

## 2014-12-04 LAB — APTT: aPTT: 36 seconds (ref 24–37)

## 2014-12-04 LAB — CBC
HEMATOCRIT: 38.2 % — AB (ref 39.0–52.0)
HEMOGLOBIN: 12.8 g/dL — AB (ref 13.0–17.0)
MCH: 30.4 pg (ref 26.0–34.0)
MCHC: 33.5 g/dL (ref 30.0–36.0)
MCV: 90.7 fL (ref 78.0–100.0)
Platelets: 220 10*3/uL (ref 150–400)
RBC: 4.21 MIL/uL — AB (ref 4.22–5.81)
RDW: 14.1 % (ref 11.5–15.5)
WBC: 4.5 10*3/uL (ref 4.0–10.5)

## 2014-12-04 LAB — TROPONIN I

## 2014-12-04 MED ORDER — HYDRALAZINE HCL 20 MG/ML IJ SOLN
10.0000 mg | Freq: Once | INTRAMUSCULAR | Status: AC
  Administered 2014-12-04: 10 mg via INTRAVENOUS
  Filled 2014-12-04: qty 1

## 2014-12-04 MED ORDER — LISINOPRIL 20 MG PO TABS: 40.0000 mg | ORAL_TABLET | Freq: Every day | ORAL | Status: DC

## 2014-12-04 MED ORDER — SODIUM CHLORIDE 0.9 % IV SOLN
INTRAVENOUS | Status: DC
  Administered 2014-12-04: 75 mL/h via INTRAVENOUS

## 2014-12-04 MED ORDER — SODIUM CHLORIDE 0.9 % IJ SOLN: 3.0000 mL | Freq: Two times a day (BID) | INTRAMUSCULAR | Status: DC

## 2014-12-04 MED ORDER — ASPIRIN EC 81 MG PO TBEC
81.0000 mg | DELAYED_RELEASE_TABLET | Freq: Every day | ORAL | Status: DC
  Administered 2014-12-05: 81 mg via ORAL
  Filled 2014-12-04: qty 1

## 2014-12-04 MED ORDER — ONDANSETRON HCL 4 MG PO TABS
4.0000 mg | ORAL_TABLET | Freq: Four times a day (QID) | ORAL | Status: DC | PRN
Start: 2014-12-04 — End: 2014-12-05

## 2014-12-04 MED ORDER — HEPARIN SODIUM (PORCINE) 5000 UNIT/ML IJ SOLN
5000.0000 [IU] | Freq: Three times a day (TID) | INTRAMUSCULAR | Status: DC
  Administered 2014-12-05 (×3): 5000 [IU] via SUBCUTANEOUS
  Filled 2014-12-04 (×3): qty 1

## 2014-12-04 MED ORDER — EZETIMIBE 10 MG PO TABS
10.0000 mg | ORAL_TABLET | Freq: Every evening | ORAL | Status: DC
  Administered 2014-12-05: 10 mg via ORAL
  Filled 2014-12-04: qty 1

## 2014-12-04 MED ORDER — LISINOPRIL 20 MG PO TABS
40.0000 mg | ORAL_TABLET | Freq: Every day | ORAL | Status: DC
  Administered 2014-12-05 (×2): 40 mg via ORAL
  Filled 2014-12-04 (×2): qty 2

## 2014-12-04 MED ORDER — OCUVITE-LUTEIN PO CAPS
1.0000 | ORAL_CAPSULE | Freq: Two times a day (BID) | ORAL | Status: DC
  Administered 2014-12-05: 1 via ORAL
  Filled 2014-12-04 (×2): qty 1

## 2014-12-04 MED ORDER — HYDRALAZINE HCL 20 MG/ML IJ SOLN
10.0000 mg | Freq: Once | INTRAMUSCULAR | Status: DC
  Filled 2014-12-04: qty 1

## 2014-12-04 MED ORDER — AMLODIPINE BESYLATE 5 MG PO TABS: 5.0000 mg | ORAL_TABLET | Freq: Every day | ORAL | Status: DC

## 2014-12-04 MED ORDER — FINASTERIDE 5 MG PO TABS
2.5000 mg | ORAL_TABLET | Freq: Every day | ORAL | Status: DC
  Administered 2014-12-05: 2.5 mg via ORAL
  Filled 2014-12-04: qty 0.5

## 2014-12-04 MED ORDER — IOHEXOL 350 MG/ML SOLN
80.0000 mL | Freq: Once | INTRAVENOUS | Status: AC | PRN
  Administered 2014-12-04: 100 mL via INTRAVENOUS

## 2014-12-04 MED ORDER — PANTOPRAZOLE SODIUM 40 MG PO TBEC
40.0000 mg | DELAYED_RELEASE_TABLET | Freq: Every day | ORAL | Status: DC
  Administered 2014-12-05: 40 mg via ORAL
  Filled 2014-12-04: qty 1

## 2014-12-04 MED ORDER — ATENOLOL 25 MG PO TABS
25.0000 mg | ORAL_TABLET | Freq: Every evening | ORAL | Status: DC
  Administered 2014-12-05 (×2): 25 mg via ORAL
  Filled 2014-12-04 (×2): qty 1

## 2014-12-04 MED ORDER — NITROGLYCERIN 0.4 MG SL SUBL
0.4000 mg | SUBLINGUAL_TABLET | SUBLINGUAL | Status: DC | PRN
  Administered 2014-12-04: 0.4 mg via SUBLINGUAL
  Filled 2014-12-04: qty 1

## 2014-12-04 MED ORDER — NITROGLYCERIN IN D5W 200-5 MCG/ML-% IV SOLN
2.0000 ug/min | INTRAVENOUS | Status: DC
  Administered 2014-12-04: 5 ug/min via INTRAVENOUS
  Filled 2014-12-04: qty 250

## 2014-12-04 MED ORDER — CLOPIDOGREL BISULFATE 75 MG PO TABS
75.0000 mg | ORAL_TABLET | Freq: Every day | ORAL | Status: DC
  Administered 2014-12-05: 75 mg via ORAL
  Filled 2014-12-04: qty 1

## 2014-12-04 MED ORDER — HYDRALAZINE HCL 20 MG/ML IJ SOLN: 5.0000 mg | INTRAMUSCULAR | Status: DC | PRN

## 2014-12-04 MED ORDER — ACETAMINOPHEN 325 MG PO TABS: 650.0000 mg | ORAL_TABLET | Freq: Four times a day (QID) | ORAL | Status: DC | PRN

## 2014-12-04 MED ORDER — ATORVASTATIN CALCIUM 80 MG PO TABS
80.0000 mg | ORAL_TABLET | Freq: Every day | ORAL | Status: DC
  Administered 2014-12-05: 80 mg via ORAL
  Filled 2014-12-04: qty 1

## 2014-12-04 MED ORDER — ONDANSETRON HCL 4 MG/2ML IJ SOLN: 4.0000 mg | Freq: Four times a day (QID) | INTRAMUSCULAR | Status: DC | PRN

## 2014-12-04 MED ORDER — AMLODIPINE BESYLATE 10 MG PO TABS
10.0000 mg | ORAL_TABLET | Freq: Every day | ORAL | Status: DC
  Administered 2014-12-05 (×2): 10 mg via ORAL
  Filled 2014-12-04 (×2): qty 1

## 2014-12-04 MED ORDER — ASPIRIN 81 MG PO CHEW: 324.0000 mg | CHEWABLE_TABLET | Freq: Once | ORAL | Status: DC

## 2014-12-04 MED ORDER — ADULT MULTIVITAMIN W/MINERALS CH
1.0000 | ORAL_TABLET | Freq: Every day | ORAL | Status: DC
  Administered 2014-12-05: 1 via ORAL
  Filled 2014-12-04: qty 1

## 2014-12-04 MED ORDER — MORPHINE SULFATE (PF) 2 MG/ML IV SOLN
2.0000 mg | INTRAVENOUS | Status: DC | PRN
  Administered 2014-12-05: 2 mg via INTRAVENOUS
  Filled 2014-12-04 (×2): qty 1

## 2014-12-04 MED ORDER — ACETAMINOPHEN 650 MG RE SUPP: 650.0000 mg | Freq: Four times a day (QID) | RECTAL | Status: DC | PRN

## 2014-12-04 MED ORDER — HYDROCHLOROTHIAZIDE 25 MG PO TABS: 12.5000 mg | ORAL_TABLET | Freq: Every day | ORAL | Status: DC

## 2014-12-04 MED ORDER — MORPHINE SULFATE (PF) 2 MG/ML IV SOLN
2.0000 mg | Freq: Once | INTRAVENOUS | Status: AC
  Administered 2014-12-04: 2 mg via INTRAVENOUS
  Filled 2014-12-04: qty 1

## 2014-12-04 MED ORDER — ONDANSETRON HCL 4 MG/2ML IJ SOLN
4.0000 mg | Freq: Once | INTRAMUSCULAR | Status: AC
  Administered 2014-12-04: 4 mg via INTRAVENOUS
  Filled 2014-12-04: qty 2

## 2014-12-04 NOTE — ED Notes (Signed)
Pt reports initially having pain to upper back/shoulders for several days. Now having pain radiating into right chest. Denies sob. No acute distress noted at this time, EKG done.

## 2014-12-04 NOTE — H&P (Addendum)
[facility] History and Physical  [name] WIO:[id number] DOB: 10-26-1922 DOA: 12/04/2014  Referring physician: ED physician PCP: Jani Gravel, MD  Specialists:   Chief Complaint: chest pain  HPI: [name] is a 81 y.o. male with PMH of hypertension, hyperlipidemia, GERD, right bundle blockage, CAD (s/p of CABG 2000), stroke, who presents with chest pain.  The patient reports that he has been having pain between the shoulder blades for the last 3 or 4 days. Pain has been waxing and waning, it seems to be worse at night and keeps him up. Since this morning, the pain has been progressed to have involved front chest. The pain over his chest and upper back has been persistent, nonradiating, 8 out of 10 in severity, sharp. He does not have cough, shortness of breath, palpitation, fever or chills. He saw his primary care physician, Dr. Maudie Mercury earlier this morning. He reports that he had blood work, chest x-ray and an EKG performed. Dr. Maudie Mercury talked to the patient's cardiologist, Dr. Thompson Caul colleague. Patient was scheduled for follow-up appointment in the cardiology office tomorrow morning, but since going home from his primary care physician's office today, his pain has been persistent, he therefore comes to ED for further evaluation. Patient does not have abdominal pain, nausea, vomiting, diarrhea, rashes, symptoms of UTI, unilateral weakness. Patient states that he has a lot of stress from cleaning home for moving recently. He does not have tenderness over calf areas, no recent history of long distance traveling. Patient reports that he is taking metformin for prevention of diabetes, and he does not have diabetes in the past. Of note, Pt had a TIA in December and has been wearing a heart monitor.  In ED, patient was found to have negative troponin, WBC 4.5, temperature normal, bradycardia, electrolytes okay, negative chest x-ray. CT angiogram of the chest showed no evidence of aortic dissection  or pulmonary embolus, cardiac enlargement, patchy nodular infiltrative changes in the lungs, likely inflammatory. Patient is admitted to inpatient for further evaluation and treatment. Cardiology was consulted by ED   Where does patient live?   At home    Can patient participate in ADLs?  Some   Review of Systems:   General: no fevers, chills, no changes in body weight, has fatigue HEENT: no blurry vision, hearing changes or sore throat Pulm: no dyspnea, coughing, wheezing CV: has chest pain, no palpitations Abd: no nausea, vomiting, abdominal pain, diarrhea, constipation GU: no dysuria, burning on urination, increased urinary frequency, hematuria  Ext: no leg edema Neuro: no unilateral weakness, numbness, or tingling, no vision change or hearing loss Skin: no rash MSK: No muscle spasm, no deformity, no limitation of range of movement in spin Heme: No easy bruising.  Travel history: No recent long distant travel.  Allergy:  Allergies  Allergen Reactions  . Monosodium Glutamate Anaphylaxis    Past Medical History  Diagnosis Date  . Right bundle branch block 12/20/2013  . Hyperlipidemia 12/20/2013  . Essential hypertension 12/20/2013  . Coronary atherosclerosis of artery bypass graft 12/20/2013    CASHD with CABG 2000 with LIMA to LAD, SV graft to diagonal, SVG to OM, and saphenous vein graft to distal RCA.     Marland Kitchen GERD (gastroesophageal reflux disease)   . Stroke   . BPH (benign prostatic hyperplasia)     Past Surgical History  Procedure Laterality Date  . Tee without cardioversion N/A 04/02/2014    Procedure: TRANSESOPHAGEAL ECHOCARDIOGRAM (TEE);  Surgeon: Lelon Perla, MD;  Location: [facility];  Service: Cardiovascular;  Laterality: N/A;  . Prostate surgery  1985  . Hernia repair  2005  . Cardiac surgery  2000    BYPASS    Social History:  reports that he has never smoked. He has never used smokeless tobacco. He reports that he drinks alcohol. He reports that he  does not use illicit drugs.  Family History:  Family History  Problem Relation Age of Onset  . Hypertension Mother      Prior to Admission medications   Medication Sig Start Date End Date Taking? Authorizing Provider  amLODipine (NORVASC) 5 MG tablet TAKE 1 TABLET BY MOUTH EVERY DAY 04/18/14  Yes Belva Crome, MD  aspirin EC 81 MG tablet Take 1 tablet (81 mg total) by mouth daily. 07/05/14  Yes Rosalin Hawking, MD  atenolol (TENORMIN) 25 MG tablet Take 1 tablet (25 mg total) by mouth 2 (two) times daily. Patient taking differently: Take 25 mg by mouth every evening.  04/17/13  Yes Belva Crome, MD  atorvastatin (LIPITOR) 80 MG tablet Take 1 tablet (80 mg total) by mouth daily. 10/31/14  Yes Rosalin Hawking, MD  clopidogrel (PLAVIX) 75 MG tablet Take 75 mg by mouth daily.   Yes Historical Provider, MD  ezetimibe (ZETIA) 10 MG tablet Take 10 mg by mouth every evening.    Yes Historical Provider, MD  finasteride (PROSCAR) 5 MG tablet Take 0.5 tablets (2.5 mg total) by mouth daily. 04/17/13  Yes Belva Crome, MD  hydrochlorothiazide (HYDRODIURIL) 25 MG tablet Take 12.5 mg by mouth daily.   Yes Historical Provider, MD  lisinopril (PRINIVIL,ZESTRIL) 40 MG tablet Take 1 tablet (40 mg total) by mouth daily. 04/17/13  Yes Belva Crome, MD  metFORMIN (GLUCOPHAGE) 500 MG tablet Take 0.5 tablets (250 mg total) by mouth daily with breakfast. 04/17/13  Yes Belva Crome, MD  Multiple Vitamins-Minerals (PRESERVISION AREDS 2) CAPS Take 1 capsule by mouth daily. Patient taking differently: Take 1 capsule by mouth 2 (two) times daily.  04/17/13  Yes Belva Crome, MD  pantoprazole (PROTONIX) 40 MG tablet Take 40 mg by mouth daily.   Yes Historical Provider, MD  Multiple Vitamin (MULTI VITAMIN DAILY PO) Take 1 tablet by mouth daily.    Historical Provider, MD    Physical Exam: Filed Vitals:   12/04/14 2140 12/04/14 2150 12/04/14 2200 12/04/14 2210  BP: 162/50 175/60 169/54 164/57  Pulse: 56 57 55 54  Temp:      TempSrc:       Resp: 14 14 16 13   Height:      Weight:      SpO2: 95% 93% 94% 96%   General: Not in acute distress.Dry mucus and membrane HEENT:       Eyes: PERRL, EOMI, no scleral icterus.       ENT: No discharge from the ears and nose, no pharynx injection, no tonsillar enlargement.        Neck: No JVD, no bruit, no mass felt. Heme: No neck lymph node enlargement. Cardiac: S1/S2, RRR, No murmurs, No gallops or rubs. Pulm: Good air movement bilaterally. No rales, wheezing, rhonchi or rubs. Abd: Soft, nondistended, nontender, no rebound pain, no organomegaly, BS present. Ext: No pitting leg edema bilaterally. 2+DP/PT pulse bilaterally. Musculoskeletal: No joint deformities, No joint redness or warmth, no limitation of ROM in spin. Skin: No rashes.  Neuro: Alert, oriented X3, cranial nerves II-XII grossly intact, muscle strength 5/5 in all extremities, sensation to light  touch intact. Brachial reflex 2+ bilaterally. Knee reflex 1+ bilaterally. Negative Babinski's sign. Normal finger to nose test. Psych: Patient is not psychotic, no suicidal or hemocidal ideation.  Labs on Admission:  Basic Metabolic Panel:  Recent Labs Lab 12/04/14 1811  NA 138  K 4.3  CL 105  CO2 25  GLUCOSE 96  BUN 25*  CREATININE 1.19  CALCIUM 8.8*   Liver Function Tests: No results for input(s): AST, ALT, ALKPHOS, BILITOT, PROT, ALBUMIN in the last 168 hours. No results for input(s): LIPASE, AMYLASE in the last 168 hours. No results for input(s): AMMONIA in the last 168 hours. CBC:  Recent Labs Lab 12/04/14 1811  WBC 4.5  HGB 12.8*  HCT 38.2*  MCV 90.7  PLT 220   Cardiac Enzymes: No results for input(s): CKTOTAL, CKMB, CKMBINDEX, TROPONINI in the last 168 hours.  BNP (last 3 results) No results for input(s): BNP in the last 8760 hours.  ProBNP (last 3 results) No results for input(s): PROBNP in the last 8760 hours.  CBG: No results for input(s): GLUCAP in the last 168 hours.  Radiological Exams  on Admission: Dg Chest 2 View  12/04/2014   CLINICAL DATA:  Chest pain  EXAM: CHEST  2 VIEW  COMPARISON:  12/04/2014  FINDINGS: The lungs are hyperinflated likely secondary to COPD. There is mild left basilar atelectasis. There is no focal parenchymal opacity. There is no pleural effusion or pneumothorax. The heart and mediastinal contours are unremarkable. There is evidence of prior CABG.  The osseous structures are unremarkable.  IMPRESSION: No active cardiopulmonary disease.   Electronically Signed   By: Kathreen Devoid   On: 12/04/2014 18:48   Ct Angio Chest Aorta W/cm &/or Wo/cm  12/04/2014   CLINICAL DATA:  Chest pain and back pain for 4 days. History of bypass surgery.  EXAM: CT ANGIOGRAPHY CHEST WITH CONTRAST  TECHNIQUE: Multidetector CT imaging of the chest was performed using the standard protocol during bolus administration of intravenous contrast. Multiplanar CT image reconstructions and MIPs were obtained to evaluate the vascular anatomy.  CONTRAST:  163mL OMNIPAQUE IOHEXOL 350 MG/ML SOLN  COMPARISON:  None.  FINDINGS: Unenhanced CT images of the chest demonstrate calcification of the thoracic aorta. No evidence of intramural hematoma. Coronary artery calcifications. Postoperative changes consistent with Coronary bypass.  Arterial phase contrast-enhanced images of the chest demonstrate mild cardiac enlargement with right heart and left atrial dilatation. Normal caliber thoracic aorta. No evidence of aortic dissection or aneurysm. Great vessel origins are patent. Central and segmental pulmonary arteries are well opacified. No focal filling defects demonstrated. No evidence of significant pulmonary embolus.  Esophagus is decompressed. Small esophageal hiatal hernia. Scattered lymph nodes in the mediastinum are not pathologically enlarged.  Atelectasis in the lung bases. Scattered focal nodular ground-glass opacities measuring less than 5 mm, likely inflammatory. No pneumothorax. No pleural effusions.   Included portions of the upper abdominal organs demonstrate no gross abnormality. No destructive bone lesions.  Review of the MIP images confirms the above findings.  IMPRESSION: No evidence of aortic dissection or pulmonary embolus. Cardiac enlargement. Patchy nodular infiltrative changes in the lungs, likely inflammatory.   Electronically Signed   By: Lucienne Capers M.D.   On: 12/04/2014 21:37    EKG: Independently reviewed.  Abnormal findings:  Bradycardia, old Right bundle blockage, no ischemic change  Assessment/Plan Principal Problem:   Chest pain Active Problems:   Coronary atherosclerosis of artery bypass graft   Essential hypertension   Hyperlipidemia   Right  bundle branch block   CVA (cerebral infarction)   Coronary artery disease involving coronary bypass graft of native heart with unspecified angina pectoris   BPH (benign prostatic hyperplasia)  Chest pain and CAD (s/p of CBAG):  CT angiogram is negative for PE or aortic dissection. No pneumonia by chest x-ray. Patient does not have shortness of breath or signs of DVT, less likely to have pulmonary embolism. His chest pain is most likely from cardiac ischemic given significant history of CAD. Initial troponin is negative, EKG has no ischemic change. Cardiology was consulted by ED.  - will admit to SDU given persistent chest pain - start Nitro gtt - cycle CE q6 x3 and repeat her EKG in the am  - Morphine, and aspirin, plavix, lipitor, atenolol - Risk factor stratification: will check FLP and A1C  - 2d echo - follow up cardiology's recommendations.  Addendum: Patient reports that his chest pain is getting worse with nitroglycerin drip. RN reports that pt's chest pain was worsened with SL NTG early.  -will stop nitro gtt -start IV hydralazine for bp control.  Essential hypertension: Blood pressure is elevated at 207/66, which improved to 175/61 after patient received prn hydralazine emergency room. -continue amlodipine,  atenolol, lisinopril -hold HCTZ since pt is clinically dry. -On nitroglycerin drip  HLD: Last LDL was 82 on 03/31/14 -Continue home medications: Lipitor and Zetia -Check FLP  Hx of CVA (cerebral infarction): -continue ASA and plavix  BPH: stable - Continue Proscar  GERD: -Protonix   DVT ppx: SQ Heparin    Code Status: Full code Family Communication:  Yes, patient's wife and daguhter-in law  at bed side Disposition Plan: Admit to inpatient   Date of Service 12/04/2014    Ivor Costa [facility] Pager [phone]  If 7PM-7AM, please contact night-coverage [email or web address] Password [email or web address] 12/04/2014, 11:00 PM

## 2014-12-04 NOTE — ED Notes (Addendum)
Pt reports pain is more constant after getting nitro. MD made aware

## 2014-12-04 NOTE — ED Notes (Signed)
Pt c/o chest pain in upper back x 4 days. Pt began having R sided chest pain, called cardiologist, and was referred to ED. Pt had a TIA in December and has been wearing a heart monitor. CP is intermittent and is sharp in nature. Pt took 324 mg ASA pta; also had a prescription for nitro called in by cardiologist, did not take nitro today. Pt noted to be bradycardic and hypertensive

## 2014-12-04 NOTE — ED Provider Notes (Signed)
CSN: [id number]     Arrival date & time 12/04/14  1727 History   First MD Initiated Contact with Patient 12/04/14 1753     Chief Complaint  Patient presents with  . Chest Pain     (Consider location/radiation/quality/duration/timing/severity/associated sxs/prior Treatment) HPI Comments: Patient presents to the ER for evaluation of chest pain. Patient reports that he has been having pain between the shoulder blade for the last 3 or 4 days. Pain has been waxing and waning, it seems to be worse at night and keeps him up. He saw his primary care physician, Dr. Maudie Mercury earlier this morning. He reports that he had blood work, chest x-ray and an EKG performed. Dr. Maudie Mercury talked to the patient's cardiologist, Dr. Tamala Julian. Patient was scheduled for follow-up appointment in the cardiology office tomorrow morning, but since going home from his primary care physician's office today has been experiencing waxing and waning aching pain in the right chest.  Patient is a 81 y.o. male presenting with chest pain.  Chest Pain Associated symptoms: back pain     Past Medical History  Diagnosis Date  . Right bundle branch block 12/20/2013  . Hyperlipidemia 12/20/2013  . Essential hypertension 12/20/2013  . Coronary atherosclerosis of artery bypass graft 12/20/2013    CASHD with CABG 2000 with LIMA to LAD, SV graft to diagonal, SVG to OM, and saphenous vein graft to distal RCA.      Past Surgical History  Procedure Laterality Date  . Tee without cardioversion N/A 04/02/2014    Procedure: TRANSESOPHAGEAL ECHOCARDIOGRAM (TEE);  Surgeon: Lelon Perla, MD;  Location: [facility] ENDOSCOPY;  Service: Cardiovascular;  Laterality: N/A;  . Prostate surgery  1985  . Hernia repair  2005  . Cardiac surgery  2000    BYPASS   Family History  Problem Relation Age of Onset  . Hypertension Mother    Social History  Substance Use Topics  . Smoking status: Never Smoker   . Smokeless tobacco: Never Used  . Alcohol Use: 0.0 oz/week   0 Standard drinks or equivalent per week     Comment: SOCIAL    Review of Systems  Cardiovascular: Positive for chest pain.  Musculoskeletal: Positive for back pain.  All other systems reviewed and are negative.     Allergies  Monosodium glutamate  Home Medications   Prior to Admission medications   Medication Sig Start Date End Date Taking? Authorizing Provider  amLODipine (NORVASC) 5 MG tablet TAKE 1 TABLET BY MOUTH EVERY DAY 04/18/14  Yes Belva Crome, MD  aspirin EC 81 MG tablet Take 1 tablet (81 mg total) by mouth daily. 07/05/14  Yes Rosalin Hawking, MD  atenolol (TENORMIN) 25 MG tablet Take 1 tablet (25 mg total) by mouth 2 (two) times daily. Patient taking differently: Take 25 mg by mouth every evening.  04/17/13  Yes Belva Crome, MD  atorvastatin (LIPITOR) 80 MG tablet Take 1 tablet (80 mg total) by mouth daily. 10/31/14  Yes Rosalin Hawking, MD  clopidogrel (PLAVIX) 75 MG tablet Take 75 mg by mouth daily.   Yes Historical Provider, MD  ezetimibe (ZETIA) 10 MG tablet Take 10 mg by mouth every evening.    Yes Historical Provider, MD  finasteride (PROSCAR) 5 MG tablet Take 0.5 tablets (2.5 mg total) by mouth daily. 04/17/13  Yes Belva Crome, MD  hydrochlorothiazide (HYDRODIURIL) 25 MG tablet Take 12.5 mg by mouth daily.   Yes Historical Provider, MD  lisinopril (PRINIVIL,ZESTRIL) 40 MG tablet Take 1 tablet (  40 mg total) by mouth daily. 04/17/13  Yes Belva Crome, MD  metFORMIN (GLUCOPHAGE) 500 MG tablet Take 0.5 tablets (250 mg total) by mouth daily with breakfast. 04/17/13  Yes Belva Crome, MD  Multiple Vitamins-Minerals (PRESERVISION AREDS 2) CAPS Take 1 capsule by mouth daily. Patient taking differently: Take 1 capsule by mouth 2 (two) times daily.  04/17/13  Yes Belva Crome, MD  pantoprazole (PROTONIX) 40 MG tablet Take 40 mg by mouth daily.   Yes Historical Provider, MD  Multiple Vitamin (MULTI VITAMIN DAILY PO) Take 1 tablet by mouth daily.    Historical Provider, MD   BP 207/66  mmHg  Pulse 53  Temp(Src) 98.2 F (36.8 C) (Oral)  Resp 23  Ht 5\' 11"  (1.803 m)  Wt 184 lb (83.462 kg)  BMI 25.67 kg/m2  SpO2 94% Physical Exam  Constitutional: He is oriented to person, place, and time. He appears well-developed and well-nourished. No distress.  HENT:  Head: Normocephalic and atraumatic.  Right Ear: Hearing normal.  Left Ear: Hearing normal.  Nose: Nose normal.  Mouth/Throat: Oropharynx is clear and moist and mucous membranes are normal.  Eyes: Conjunctivae and EOM are normal. Pupils are equal, round, and reactive to light.  Neck: Normal range of motion. Neck supple.  Cardiovascular: Regular rhythm, S1 normal and S2 normal.  Exam reveals no gallop and no friction rub.   No murmur heard. Pulmonary/Chest: Effort normal and breath sounds normal. No respiratory distress. He exhibits no tenderness.  Abdominal: Soft. Normal appearance and bowel sounds are normal. There is no hepatosplenomegaly. There is no tenderness. There is no rebound, no guarding, no tenderness at McBurney's point and negative Murphy's sign. No hernia.  Musculoskeletal: Normal range of motion.  Neurological: He is alert and oriented to person, place, and time. He has normal strength. No cranial nerve deficit or sensory deficit. Coordination normal. GCS eye subscore is 4. GCS verbal subscore is 5. GCS motor subscore is 6.  Skin: Skin is warm, dry and intact. No rash noted. No cyanosis.  Psychiatric: He has a normal mood and affect. His speech is normal and behavior is normal. Thought content normal.  Nursing note and vitals reviewed.   ED Course  Procedures (including critical care time) Labs Review Labs Reviewed  BASIC METABOLIC PANEL - Abnormal; Notable for the following:    BUN 25 (*)    Calcium 8.8 (*)    GFR calc non Af Amer 52 (*)    All other components within normal limits  CBC - Abnormal; Notable for the following:    RBC 4.21 (*)    Hemoglobin 12.8 (*)    HCT 38.2 (*)    All other  components within normal limits  I-STAT TROPOININ, ED    Imaging Review Dg Chest 2 View  12/04/2014   CLINICAL DATA:  Chest pain  EXAM: CHEST  2 VIEW  COMPARISON:  12/04/2014  FINDINGS: The lungs are hyperinflated likely secondary to COPD. There is mild left basilar atelectasis. There is no focal parenchymal opacity. There is no pleural effusion or pneumothorax. The heart and mediastinal contours are unremarkable. There is evidence of prior CABG.  The osseous structures are unremarkable.  IMPRESSION: No active cardiopulmonary disease.   Electronically Signed   By: Kathreen Devoid   On: 12/04/2014 18:48   I have personally reviewed and evaluated these images and lab results as part of my medical decision-making.   EKG Interpretation   Date/Time:  Wednesday December 04 2014  17:33:12 EDT Ventricular Rate:  50 PR Interval:  194 QRS Duration: 142 QT Interval:  496 QTC Calculation: 452 R Axis:   26 Text Interpretation:  Sinus bradycardia Right bundle branch block Abnormal  ECG No significant change since last tracing Confirmed by POLLINA  MD,  [place] [phone]) on 12/04/2014 6:05:55 PM      MDM   Final diagnoses:  None   chest pain  Patient referred to the ER by primary doctor for evaluation of chest pain. He has been experiencing several days of subscapular and intrascapular back pain. He saw his doctor for this earlier today, had blood work and an EKG performed in the office. Patient's primary physician arrange for follow-up with his cardiologist first thing in the morning. After leaving the office, however, patient began to develop chest pain. He has been experiencing waxing and waning pain to the right side of his sternum. He was noted to be very hypertensive here in the ER. This did not resolve with nitroglycerin. He also did not have pain relief with the nitroglycerin, however. Patient to minister hydralazine for his blood pressure. Cardiology was consulted. They did not feel that the  patient needs to be admitted to the cardiology service, recommended admission to medicine, will consult and follow.    Orpah Greek, MD 12/04/14 [phone]

## 2014-12-04 NOTE — ED Notes (Signed)
Spoke to Dr.Niu regarding worsening chest pain with nitro drip; nitro to be d/c

## 2014-12-04 NOTE — ED Notes (Signed)
Dr Niu at bedside 

## 2014-12-05 ENCOUNTER — Other Ambulatory Visit (HOSPITAL_COMMUNITY): Payer: Medicare Other

## 2014-12-05 ENCOUNTER — Observation Stay (HOSPITAL_BASED_OUTPATIENT_CLINIC_OR_DEPARTMENT_OTHER): Payer: Medicare Other

## 2014-12-05 ENCOUNTER — Observation Stay (HOSPITAL_COMMUNITY): Payer: Medicare Other

## 2014-12-05 ENCOUNTER — Other Ambulatory Visit: Payer: Self-pay | Admitting: Physician Assistant

## 2014-12-05 DIAGNOSIS — R0789 Other chest pain: Secondary | ICD-10-CM

## 2014-12-05 DIAGNOSIS — R079 Chest pain, unspecified: Secondary | ICD-10-CM

## 2014-12-05 DIAGNOSIS — I5030 Unspecified diastolic (congestive) heart failure: Secondary | ICD-10-CM | POA: Diagnosis not present

## 2014-12-05 DIAGNOSIS — E785 Hyperlipidemia, unspecified: Secondary | ICD-10-CM | POA: Diagnosis not present

## 2014-12-05 DIAGNOSIS — I10 Essential (primary) hypertension: Secondary | ICD-10-CM | POA: Diagnosis not present

## 2014-12-05 DIAGNOSIS — I2510 Atherosclerotic heart disease of native coronary artery without angina pectoris: Secondary | ICD-10-CM | POA: Diagnosis not present

## 2014-12-05 DIAGNOSIS — N400 Enlarged prostate without lower urinary tract symptoms: Secondary | ICD-10-CM

## 2014-12-05 DIAGNOSIS — I25119 Atherosclerotic heart disease of native coronary artery with unspecified angina pectoris: Secondary | ICD-10-CM | POA: Diagnosis not present

## 2014-12-05 DIAGNOSIS — M940 Chondrocostal junction syndrome [Tietze]: Secondary | ICD-10-CM | POA: Diagnosis present

## 2014-12-05 DIAGNOSIS — I639 Cerebral infarction, unspecified: Secondary | ICD-10-CM

## 2014-12-05 LAB — RAPID URINE DRUG SCREEN, HOSP PERFORMED
Amphetamines: NOT DETECTED
BARBITURATES: NOT DETECTED
BENZODIAZEPINES: NOT DETECTED
COCAINE: NOT DETECTED
OPIATES: POSITIVE — AB
Tetrahydrocannabinol: NOT DETECTED

## 2014-12-05 LAB — COMPREHENSIVE METABOLIC PANEL
ALBUMIN: 3.2 g/dL — AB (ref 3.5–5.0)
ALK PHOS: 64 U/L (ref 38–126)
ALT: 16 U/L — AB (ref 17–63)
ANION GAP: 7 (ref 5–15)
AST: 22 U/L (ref 15–41)
BILIRUBIN TOTAL: 0.9 mg/dL (ref 0.3–1.2)
BUN: 20 mg/dL (ref 6–20)
CALCIUM: 8.6 mg/dL — AB (ref 8.9–10.3)
CO2: 26 mmol/L (ref 22–32)
Chloride: 107 mmol/L (ref 101–111)
Creatinine, Ser: 1.23 mg/dL (ref 0.61–1.24)
GFR calc Af Amer: 58 mL/min — ABNORMAL LOW (ref >60)
GFR calc non Af Amer: 50 mL/min — ABNORMAL LOW (ref >60)
GLUCOSE: 106 mg/dL — AB (ref 65–99)
Potassium: 4.3 mmol/L (ref 3.5–5.1)
SODIUM: 140 mmol/L (ref 135–145)
TOTAL PROTEIN: 5.2 g/dL — AB (ref 6.5–8.1)

## 2014-12-05 LAB — CBC
HCT: 34.9 % — ABNORMAL LOW (ref 39.0–52.0)
HEMOGLOBIN: 11.6 g/dL — AB (ref 13.0–17.0)
MCH: 30.5 pg (ref 26.0–34.0)
MCHC: 33.2 g/dL (ref 30.0–36.0)
MCV: 91.8 fL (ref 78.0–100.0)
Platelets: 177 10*3/uL (ref 150–400)
RBC: 3.8 MIL/uL — ABNORMAL LOW (ref 4.22–5.81)
RDW: 14.2 % (ref 11.5–15.5)
WBC: 4.8 10*3/uL (ref 4.0–10.5)

## 2014-12-05 LAB — LIPID PANEL
CHOLESTEROL: 81 mg/dL (ref 0–200)
HDL: 29 mg/dL — ABNORMAL LOW (ref >40)
LDL CALC: 35 mg/dL (ref 0–99)
Total CHOL/HDL Ratio: 2.8 RATIO
Triglycerides: 83 mg/dL (ref <150)
VLDL: 17 mg/dL (ref 0–40)

## 2014-12-05 LAB — MRSA PCR SCREENING: MRSA by PCR: NEGATIVE

## 2014-12-05 LAB — GLUCOSE, CAPILLARY: GLUCOSE-CAPILLARY: 90 mg/dL (ref 65–99)

## 2014-12-05 LAB — TROPONIN I
Troponin I: <0.03 ng/mL (ref <0.031)
Troponin I: <0.03 ng/mL (ref <0.031)

## 2014-12-05 MED ORDER — AMLODIPINE BESYLATE 10 MG PO TABS: 10.0000 mg | ORAL_TABLET | Freq: Every day | ORAL | Status: DC

## 2014-12-05 NOTE — Progress Notes (Signed)
D/C home with family

## 2014-12-05 NOTE — Progress Notes (Signed)
Noted red rash under rt. arm radiating to rt. Scapular region. Denies pain or itching. Dr. Sherral Hammers notified.

## 2014-12-05 NOTE — Progress Notes (Signed)
[place] TEAM 1 - Stepdown/ICU TEAM Progress Note  [name] XFG:[id number] DOB: 1922/09/21 DOA: 12/04/2014 PCP: Jani Gravel, MD  Admit HPI / Brief Narrative: 81 y.o. WM PMHx  hypertension, RBBB, CAD (s/p of CABG 2000), patient with implantable loop recorder since 03/2014, hyperlipidemia, GERD, CVA,.   Who presents with chest pain. The patient reports that he has been having pain between the shoulder blades for the last 3 or 4 days. Pain has been waxing and waning, it seems to be worse at night and keeps him up. Since this morning, the pain has been progressed to have involved front chest. The pain over his chest and upper back has been persistent, nonradiating, 8 out of 10 in severity, sharp. He does not have cough, shortness of breath, palpitation, fever or chills. He saw his primary care physician, Dr. Maudie Mercury earlier this morning. He reports that he had blood work, chest x-ray and an EKG performed. Dr. Maudie Mercury talked to the patient's cardiologist, Dr. Thompson Caul colleague. Patient was scheduled for follow-up appointment in the cardiology office tomorrow morning, but since going home from his primary care physician's office today, his pain has been persistent, he therefore comes to ED for further evaluation. Patient does not have abdominal pain, nausea, vomiting, diarrhea, rashes, symptoms of UTI, unilateral weakness. Patient states that he has a lot of stress from cleaning home for moving recently. He does not have tenderness over calf areas, no recent history of long distance traveling. Patient reports that he is taking metformin for prevention of diabetes, and he does not have diabetes in the past. Of note, Pt had a TIA in December and has been wearing a heart monitor.  In ED, patient was found to have negative troponin, WBC 4.5, temperature normal, bradycardia, electrolytes okay, negative chest x-ray. CT angiogram of the chest showed no evidence of aortic dissection or pulmonary embolus, cardiac  enlargement, patchy nodular infiltrative changes in the lungs, likely inflammatory. Patient is admitted to inpatient for further evaluation and treatment. Cardiology was consulted by ED   HPI/Subjective: 8/25 A/O 4, states that he has been having waxing and waning chest pain since 2015. States had extensive workup and in December 2015 an implantable loop recorder was placed. States his monthly reports from cardiology have not shown a reason for his chest pain. States the pain is waxing and waning on the right chest area localized, negative radiation, negative N/V, negative SOB. States is exacerbated when he moves in a certain direction. Describes pain as sharp/electric. Currently had at least one incident of the pain while being examined reproducible with palpation.  Assessment/Plan: Chest pain/CAD native artery/(s/p of CBAG):  -HEART score= 3 -CT angiogram is negative for PE or aortic dissection. No PNA. -Although patient does have history of CAD, negative EKG changes, negative troponin 2, and reproducible CP less likely cardiac in nature.. -Echocardiogram pending -Cardiology consulted this a.m. -Plavix 75 mg daily -Aspirin 81 mg daily NOTE patient's nitroglycerin stopped last night secondary to increasing CP  Essential hypertension:  -At admission BP elevated at 207/66, which improved to 175/61 after patient received prn hydralazine emergency room. -continue Amlodipine 10 mg daily -Continue Atenolol 25 mg daily -Continue Lisinopril 40 mg daily  HLD: -Lipid panel within AHA guideline -Continue Lipitor 80 mg daily - Last LDL was 82 on 03/31/14 -Continue setting of 10 mg daily   CVA (cerebral infarction): -continue ASA and plavix  BPH:  -stable - Continue Proscar 2.5 mg daily  Positive UDS -UDS positive for opiates,  however opiates or not on patient's MAR; blood sample most likely taken after patient received pain medication in ED   Code Status: FULL Family Communication:  no family present at time of exam Disposition Plan: Per cardiology    Consultants: Cardiology consult pending   Procedure/Significant Events: 8/24 CT angiogram chest PE protocol; negative aortic dissection or pulmonary embolus. Cardiac enlargement. Patchy nodular infiltrative changes in the lungs,likely inflammatory.   Culture   Antibiotics:   DVT prophylaxis: Subcutaneous heparin   Devices    LINES / TUBES:      Continuous Infusions:   Objective: VITAL SIGNS: Temp: 98.6 F (37 C) (08/25 0735) Temp Source: Oral (08/25 0735) BP: 158/51 mmHg (08/25 0943) Pulse Rate: 59 (08/25 0735) SPO2; FIO2:   Intake/Output Summary (Last 24 hours) at 12/05/14 1011 Last data filed at 12/05/14 0200  Gross per 24 hour  Intake      0 ml  Output   1250 ml  Net  -1250 ml     Exam: General: A/O 4, positive right anterior chest wall pain, No acute respiratory distress Eyes: Negative headache, eye pain, double vision,negative scleral hemorrhage ENT: Negative Runny nose, negative ear pain, negative tinnitus, negative gingival bleeding Neck:  Negative scars, masses, torticollis, lymphadenopathy, JVD Lungs: Clear to auscultation bilaterally without wheezes or crackles Cardiovascular: Regular rate and rhythm without murmur gallop or rub normal S1 and S2 Abdomen:negative abdominal pain, negative dysphagia, nondistended, positive soft, bowel sounds, no rebound, no ascites, no appreciable mass Extremities: No significant cyanosis, clubbing, or edema bilateral lower extremities Psychiatric:  Negative depression, negative anxiety, negative fatigue, negative mania  Neurologic:  Cranial nerves II through XII intact, tongue/uvula midline, all extremities muscle strength 5/5, sensation intact throughout, negative dysarthria, negative expressive aphasia, negative receptive aphasia.     Data Reviewed: Basic Metabolic Panel:  Recent Labs Lab 12/04/14 1811 12/05/14 0427  NA 138 140    K 4.3 4.3  CL 105 107  CO2 25 26  GLUCOSE 96 106*  BUN 25* 20  CREATININE 1.19 1.23  CALCIUM 8.8* 8.6*   Liver Function Tests:  Recent Labs Lab 12/05/14 0427  AST 22  ALT 16*  ALKPHOS 64  BILITOT 0.9  PROT 5.2*  ALBUMIN 3.2*   No results for input(s): LIPASE, AMYLASE in the last 168 hours. No results for input(s): AMMONIA in the last 168 hours. CBC:  Recent Labs Lab 12/04/14 1811 12/05/14 0427  WBC 4.5 4.8  HGB 12.8* 11.6*  HCT 38.2* 34.9*  MCV 90.7 91.8  PLT 220 177   Cardiac Enzymes:  Recent Labs Lab 12/04/14 2254 12/05/14 0427  TROPONINI <0.03 <0.03   BNP (last 3 results) No results for input(s): BNP in the last 8760 hours.  ProBNP (last 3 results) No results for input(s): PROBNP in the last 8760 hours.  CBG:  Recent Labs Lab 12/05/14 0735  GLUCAP 90    Recent Results (from the past 240 hour(s))  MRSA PCR Screening     Status: None   Collection Time: 12/05/14  1:56 AM  Result Value Ref Range Status   MRSA by PCR NEGATIVE NEGATIVE Final    Comment:        The GeneXpert MRSA Assay (FDA approved for NASAL specimens only), is one component of a comprehensive MRSA colonization surveillance program. It is not intended to diagnose MRSA infection nor to guide or monitor treatment for MRSA infections.      Studies:  Recent x-ray studies have been reviewed in detail by the  Attending Physician  Scheduled Meds:  Scheduled Meds: . amLODipine  10 mg Oral Daily  . aspirin  324 mg Oral Once  . aspirin EC  81 mg Oral Daily  . atenolol  25 mg Oral QPM  . atorvastatin  80 mg Oral Daily  . clopidogrel  75 mg Oral Daily  . ezetimibe  10 mg Oral QPM  . finasteride  2.5 mg Oral Daily  . heparin  5,000 Units Subcutaneous 3 times per day  . lisinopril  40 mg Oral Daily  . multivitamin with minerals  1 tablet Oral Daily  . multivitamin-lutein  1 capsule Oral BID  . pantoprazole  40 mg Oral Daily  . sodium chloride  3 mL Intravenous Q12H     Time spent on care of this patient: 40 mins   Kaige Whistler, Geraldo Docker , MD  [facility] Office  [phone] Pager [phone]  On-Call/Text Page:      [email or web address]      password [email or web address]  If 7PM-7AM, please contact night-coverage [email or web address] Password [email or web address] 12/05/2014, 10:11 AM   LOS: 1 day   Care during the described time interval was provided by me .  I have reviewed this patient's available data, including medical history, events of note, physical examination, and all test results as part of my evaluation. I have personally reviewed and interpreted all radiology studies.   Dia Crawford, MD [phone] Pager

## 2014-12-05 NOTE — Discharge Summary (Signed)
Physician Discharge Summary  VERNELL TOWNLEY KWI:[id number] DOB: April 20, 1922 DOA: 12/04/2014  PCP: Jani Gravel, MD  Admit date: 12/04/2014 Discharge date: 12/05/2014  Time spent: 40 minutes  Recommendations for Outpatient Follow-up:   Chest pain/CAD native artery/(s/p of CBAG)/Costochondritis  -HEART score= 3 -CT angiogram is negative for PE or aortic dissection. No PNA. -Although patient does have history of CAD, negative EKG changes, negative troponin 2, and reproducible CP less likely cardiac in nature. CP more consistent with costochondritis -Echocardiogram; diastolic CHF see results below -Plavix 75 mg daily -Aspirin 81 mg daily -Follow-up with [facility] on 8/31 @ 1200  Essential hypertension:  -At admission BP elevated at 207/66, which improved to 175/61 after patient received prn hydralazine emergency room. -continue Amlodipine 10 mg daily -Continue Atenolol 25 mg daily -Continue Lisinopril 40 mg daily -Follow-up with PCP to titrate HTN medication  HLD: -Lipid panel within AHA guideline -Continue Lipitor 80 mg daily - Last LDL was 82 on 03/31/14 -Continue Lipitor 10 mg daily   CVA (cerebral infarction): -continue ASA and plavix  BPH:  -stable - Continue Proscar 2.5 mg daily  Positive UDS -UDS positive for opiates, however opiates or not on patient's MAR; blood sample most likely taken after patient received pain medication in ED    Discharge Diagnoses:  Principal Problem:   Chest pain Active Problems:   Coronary atherosclerosis of artery bypass graft   Essential hypertension   Hyperlipidemia   Right bundle branch block   CVA (cerebral infarction)   Coronary artery disease involving coronary bypass graft of native heart with unspecified angina pectoris   BPH (benign prostatic hyperplasia)   Other chest pain   CAD in native artery   HLD (hyperlipidemia)   CVA (cerebral vascular accident)   Costochondritis   Discharge Condition: Stable  Diet  recommendation: Heart healthy  Filed Weights   12/04/14 1751 12/05/14 0024  Weight: 83.462 kg (184 lb) 81.7 kg (180 lb 1.9 oz)    History of present illness:  81 y.o. WM PMHx hypertension, RBBB, CAD (s/p of CABG 2000), patient with implantable loop recorder since 03/2014, hyperlipidemia, GERD, CVA,.   Who presents with chest pain. The patient reports that he has been having pain between the shoulder blades for the last 3 or 4 days. Pain has been waxing and waning, it seems to be worse at night and keeps him up. Since this morning, the pain has been progressed to have involved front chest. The pain over his chest and upper back has been persistent, nonradiating, 8 out of 10 in severity, sharp. He does not have cough, shortness of breath, palpitation, fever or chills. He saw his primary care physician, Dr. Maudie Mercury earlier this morning. He reports that he had blood work, chest x-ray and an EKG performed. Dr. Maudie Mercury talked to the patient's cardiologist, Dr. Thompson Caul colleague. Patient was scheduled for follow-up appointment in the cardiology office tomorrow morning, but since going home from his primary care physician's office today, his pain has been persistent, he therefore comes to ED for further evaluation. Patient does not have abdominal pain, nausea, vomiting, diarrhea, rashes, symptoms of UTI, unilateral weakness. Patient states that he has a lot of stress from cleaning home for moving recently. He does not have tenderness over calf areas, no recent history of long distance traveling. Patient reports that he is taking metformin for prevention of diabetes, and he does not have diabetes in the past. Of note, Pt had a TIA in December and has been wearing a heart  monitor.  In ED, patient was found to have negative troponin, WBC 4.5, temperature normal, bradycardia, electrolytes okay, negative chest x-ray. CT angiogram of the chest showed no evidence of aortic dissection or pulmonary embolus, cardiac  enlargement, patchy nodular infiltrative changes in the lungs, likely inflammatory. Patient is admitted to inpatient for further evaluation and treatment. Cardiology was consulted by ED 8/25 A/O 4, states that he has been having waxing and waning chest pain since 2015. States had extensive workup and in December 2015 an implantable loop recorder was placed. States his monthly reports from cardiology have not shown a reason for his chest pain. States the pain is waxing and waning on the right chest area localized, negative radiation, negative N/V, negative SOB. States is exacerbated when he moves in a certain direction. Describes pain as sharp/electric. Currently had at least one incident of the pain while being examined reproducible with palpation. Patient was evaluated by Dr.Michael Burt Knack (cardiology) who reviewed the echocardiogram and palpation was safe to go home with follow-up. A outpatient nuclear study to further evaluate his chest pain will be arranged by cardiology. Patient was informed earlier by myself that I did not believe it was cardiac chest pain in nature however to be on the safe side would defer to cardiology given his significant history. Patient's chest pain most likely costochondritis as it was reproducible on palpation.   Procedures: 8/24 CT angiogram chest PE protocol; negative aortic dissection or pulmonary embolus. Cardiac enlargement. Patchy nodular infiltrative changes in the lungs,likely inflammatory. 8/25 echocardiogram;- LVEF= 60%-65%. -(grade 2 diastolic dysfunction).  - Left atrium:  moderately dilated. - Right atrium:  moderately dilated. - Tricuspid valve:  moderate regurgitation. - Pulmonary arteries: PA peak pressure: 38 mm Hg (S).    Consultations: Dr.Michael Burt Knack (cardiology)   Discharge Exam: Filed Vitals:   12/05/14 0943 12/05/14 1202 12/05/14 1600 12/05/14 1628  BP: 158/51 128/50 115/43   Pulse:  52 50   Temp:  98 F (36.7 C)  98.3 F (36.8 C)   TempSrc:  Oral  Oral  Resp:  19 17   Height:      Weight:      SpO2:  92% 93%     General: A/O 4, positive right anterior chest wall pain, No acute respiratory distress Eyes: Negative headache, eye pain, double vision,negative scleral hemorrhage ENT: Negative Runny nose, negative ear pain, negative tinnitus, negative gingival bleeding Neck: Negative scars, masses, torticollis, lymphadenopathy, JVD Lungs: Clear to auscultation bilaterally without wheezes or crackles Cardiovascular: Regular rate and rhythm without murmur gallop or rub normal S1 and S2 Abdomen:negative abdominal pain, negative dysphagia, nondistended, positive soft, bowel sounds, no rebound, no ascites, no appreciable mass Extremities: No significant cyanosis, clubbing, or edema bilateral lower extremities Psychiatric: Negative depression, negative anxiety, negative fatigue, negative mania  Neurologic: Cranial nerves II through XII intact, tongue/uvula midline, all extremities muscle strength 5/5, sensation intact throughout, negative dysarthria, negative expressive aphasia, negative receptive aphasia.    Discharge Instructions     Medication List    STOP taking these medications        hydrochlorothiazide 25 MG tablet  Commonly known as:  HYDRODIURIL     PRESERVISION AREDS 2 Caps      TAKE these medications        amLODipine 10 MG tablet  Commonly known as:  NORVASC  Take 1 tablet (10 mg total) by mouth daily.     aspirin EC 81 MG tablet  Take 1 tablet (81 mg total) by mouth  daily.     atenolol 25 MG tablet  Commonly known as:  TENORMIN  Take 1 tablet (25 mg total) by mouth 2 (two) times daily.     atorvastatin 80 MG tablet  Commonly known as:  LIPITOR  Take 1 tablet (80 mg total) by mouth daily.     clopidogrel 75 MG tablet  Commonly known as:  PLAVIX  Take 75 mg by mouth daily.     ezetimibe 10 MG tablet  Commonly known as:  ZETIA  Take 10 mg by mouth every evening.     finasteride 5  MG tablet  Commonly known as:  PROSCAR  Take 0.5 tablets (2.5 mg total) by mouth daily.     lisinopril 40 MG tablet  Commonly known as:  PRINIVIL,ZESTRIL  Take 1 tablet (40 mg total) by mouth daily.     metFORMIN 500 MG tablet  Commonly known as:  GLUCOPHAGE  Take 0.5 tablets (250 mg total) by mouth daily with breakfast.     MULTI VITAMIN DAILY PO  Take 1 tablet by mouth daily.     pantoprazole 40 MG tablet  Commonly known as:  PROTONIX  Take 40 mg by mouth daily.       Allergies  Allergen Reactions  . Monosodium Glutamate Anaphylaxis   Follow-up Information    Follow up with [facility] On 12/11/2014.   Why:  [address] @ 12 noon for a stress test   Contact information:   [place] [id number] [phone]      Follow up with Truitt Merle, NP On 01/02/2015.   Specialties:  Nurse Practitioner, Interventional Cardiology, Cardiology, Radiology   Why:  @ 1:30pm    Contact information:   [place] [place] [address] [phone]       Follow up with Jani Gravel, MD.   Specialty:  Internal Medicine   Why:  Follow-up with Dr. Jani Gravel in 2 weeks HTN, HLD, chest pain   Contact information:   [address] [place]  [address] [phone]        The results of significant diagnostics from this hospitalization (including imaging, microbiology, ancillary and laboratory) are listed below for reference.    Significant Diagnostic Studies: Dg Chest 2 View  12/04/2014   CLINICAL DATA:  Chest pain  EXAM: CHEST  2 VIEW  COMPARISON:  12/04/2014  FINDINGS: The lungs are hyperinflated likely secondary to COPD. There is mild left basilar atelectasis. There is no focal parenchymal opacity. There is no pleural effusion or pneumothorax. The heart and mediastinal contours are unremarkable. There is evidence of prior CABG.  The osseous structures are  unremarkable.  IMPRESSION: No active cardiopulmonary disease.   Electronically Signed   By: Kathreen Devoid   On: 12/04/2014 18:48   Ct Angio Chest Aorta W/cm &/or Wo/cm  12/04/2014   CLINICAL DATA:  Chest pain and back pain for 4 days. History of bypass surgery.  EXAM: CT ANGIOGRAPHY CHEST WITH CONTRAST  TECHNIQUE: Multidetector CT imaging of the chest was performed using the standard protocol during bolus administration of intravenous contrast. Multiplanar CT image reconstructions and MIPs were obtained to evaluate the vascular anatomy.  CONTRAST:  146mL OMNIPAQUE IOHEXOL 350 MG/ML SOLN  COMPARISON:  None.  FINDINGS: Unenhanced CT images of the chest demonstrate calcification of the thoracic aorta. No evidence of intramural hematoma. Coronary artery calcifications. Postoperative changes consistent with Coronary bypass.  Arterial phase  contrast-enhanced images of the chest demonstrate mild cardiac enlargement with right heart and left atrial dilatation. Normal caliber thoracic aorta. No evidence of aortic dissection or aneurysm. Great vessel origins are patent. Central and segmental pulmonary arteries are well opacified. No focal filling defects demonstrated. No evidence of significant pulmonary embolus.  Esophagus is decompressed. Small esophageal hiatal hernia. Scattered lymph nodes in the mediastinum are not pathologically enlarged.  Atelectasis in the lung bases. Scattered focal nodular ground-glass opacities measuring less than 5 mm, likely inflammatory. No pneumothorax. No pleural effusions.  Included portions of the upper abdominal organs demonstrate no gross abnormality. No destructive bone lesions.  Review of the MIP images confirms the above findings.  IMPRESSION: No evidence of aortic dissection or pulmonary embolus. Cardiac enlargement. Patchy nodular infiltrative changes in the lungs, likely inflammatory.   Electronically Signed   By: Lucienne Capers M.D.   On: 12/04/2014 21:37     Microbiology: Recent Results (from the past 240 hour(s))  MRSA PCR Screening     Status: None   Collection Time: 12/05/14  1:56 AM  Result Value Ref Range Status   MRSA by PCR NEGATIVE NEGATIVE Final    Comment:        The GeneXpert MRSA Assay (FDA approved for NASAL specimens only), is one component of a comprehensive MRSA colonization surveillance program. It is not intended to diagnose MRSA infection nor to guide or monitor treatment for MRSA infections.      Labs: Basic Metabolic Panel:  Recent Labs Lab 12/04/14 1811 12/05/14 0427  NA 138 140  K 4.3 4.3  CL 105 107  CO2 25 26  GLUCOSE 96 106*  BUN 25* 20  CREATININE 1.19 1.23  CALCIUM 8.8* 8.6*   Liver Function Tests:  Recent Labs Lab 12/05/14 0427  AST 22  ALT 16*  ALKPHOS 64  BILITOT 0.9  PROT 5.2*  ALBUMIN 3.2*   No results for input(s): LIPASE, AMYLASE in the last 168 hours. No results for input(s): AMMONIA in the last 168 hours. CBC:  Recent Labs Lab 12/04/14 1811 12/05/14 0427  WBC 4.5 4.8  HGB 12.8* 11.6*  HCT 38.2* 34.9*  MCV 90.7 91.8  PLT 220 177   Cardiac Enzymes:  Recent Labs Lab 12/04/14 2254 12/05/14 0427 12/05/14 1300  TROPONINI <0.03 <0.03 <0.03   BNP: BNP (last 3 results) No results for input(s): BNP in the last 8760 hours.  ProBNP (last 3 results) No results for input(s): PROBNP in the last 8760 hours.  CBG:  Recent Labs Lab 12/05/14 0735  GLUCAP 90       Signed:  Dia Crawford, MD [facility] [phone] pager

## 2014-12-05 NOTE — Progress Notes (Signed)
Echocardiogram 2D Echocardiogram has been performed.  Tresa Res 12/05/2014, 1:14 PM

## 2014-12-05 NOTE — Evaluation (Signed)
Occupational Therapy Evaluation Patient Details Name: [name] MRN: [id number] DOB: 1922-09-10 Today's Date: 12/05/2014    History of Present Illness 81 year old gentleman with coronary artery disease and coronary bypass surgery in 2000. He had a cryptogenic stroke earlier this year and an implantable loop recorder was placed. He presents with approximately 3 days of mid scapular back pain and right sided anterior chest pain. He points to a focal area in the right chest and describes sharp shooting pains lasting no more than 30 seconds. The pain is not associated with physical exertion or any specific movement. There is no associated shortness of breath. There is no substernal chest pain or pressure. He does admit to exertional dyspnea and states that he attributed this to his age. He has been doing a lot of physical work as he is in the process of moving. He saw his primary care physician initially, underwent evaluation with EKG and blood work, and was prescribed a muscle relaxer. He was referred for outpatient cardiac evaluation but because of ongoing symptoms he presented to the emergency department.  His wife and daughters are present at the bedside.    Clinical Impression   Pt was independent prior to admission. Performing ADL with set up. Supervised ambulation to bathroom primarily due to lines.  No OT needs.    Follow Up Recommendations  No OT follow up    Equipment Recommendations  None recommended by OT    Recommendations for Other Services       Precautions / Restrictions Precautions Precautions: Fall Restrictions Weight Bearing Restrictions: No      Mobility Bed Mobility Overal bed mobility: Independent                Transfers Overall transfer level: Independent Equipment used: None                  Balance Overall balance assessment: Needs assistance         Standing balance support: No upper extremity supported;During functional  activity Standing balance-Leahy Scale: Fair Standing balance comment: can stand statically without UE support.               High level balance activites: Direction changes;Turns;Head turns High Level Balance Comments: min guard assist for above.  no LOB.            ADL Overall ADL's : Independent                                     Functional mobility during ADLs: Supervision/safety General ADL Comments: Pt performing self care with set up, supervised pt for toilet transfer primarily for lines.     Vision     Perception     Praxis      Pertinent Vitals/Pain Pain Assessment: 0-10 Pain Score: 2  Pain Location: chest Pain Descriptors / Indicators: Aching;Dull Pain Intervention(s): Monitored during session     Hand Dominance Right   Extremity/Trunk Assessment Upper Extremity Assessment Upper Extremity Assessment: Overall WFL for tasks assessed   Lower Extremity Assessment Lower Extremity Assessment: Overall WFL for tasks assessed   Cervical / Trunk Assessment Cervical / Trunk Assessment: Normal   Communication Communication Communication: No difficulties   Cognition Arousal/Alertness: Awake/alert Behavior During Therapy: WFL for tasks assessed/performed Overall Cognitive Status: Within Functional Limits for tasks assessed  General Comments       Exercises       Shoulder Instructions      Home Living Family/patient expects to be discharged to:: Private residence Living Arrangements: Spouse/significant other Available Help at Discharge: Family;Available PRN/intermittently (assists his wife with ambulation) Type of Home: House Home Access: Stairs to enter [employer] of Steps: 2 Entrance Stairs-Rails: None Home Layout: One level     Bathroom Shower/Tub: Teacher, early years/pre: Standard Bathroom Accessibility: Yes   Home Equipment: None   Additional Comments: Pt is selling  their two homes to move into [facility] living.  Under a lot of stress.        Prior Functioning/Environment Level of Independence: Independent        Comments: Exercised at gym 3xweek. Drives.     OT Diagnosis:     OT Problem List:     OT Treatment/Interventions:      OT Goals(Current goals can be found in the care plan section) Acute Rehab OT Goals Patient Stated Goal: to go home  OT Frequency:     Barriers to D/C:            Co-evaluation              End of Session    Activity Tolerance: Patient tolerated treatment well Patient left: in bed;with call bell/phone within reach   Time: 1439-1455 OT Time Calculation (min): 16 min Charges:  OT General Charges $OT Visit: 1 Procedure OT Evaluation $Initial OT Evaluation Tier I: 1 Procedure G-Codes: OT G-codes **NOT FOR INPATIENT CLASS** Functional Assessment Tool Used: clinical judgement Functional Limitation: Self care Self Care Current Status (U2353): At least 80 percent but less than 100 percent impaired, limited or restricted Self Care Goal Status (I1443): At least 80 percent but less than 100 percent impaired, limited or restricted Self Care Discharge Status [phone]): At least 80 percent but less than 100 percent impaired, limited or restricted  [name] 12/05/2014, 3:53 PM  [phone]

## 2014-12-05 NOTE — Consult Note (Signed)
CARDIOLOGY CONSULT NOTE  Patient ID: [name], MRN: [id number], DOB/AGE: 1922-11-29 81 y.o. Admit date: 12/04/2014 Date of Consult: 12/05/2014  Primary Physician: Jani Gravel, MD Primary Cardiologist: Dr Tamala Julian Referring Physician: Dr Sherral Hammers  Chief Complaint: Chest and back pain  Reason for Consultation: same  HPI: 81 year old gentleman with coronary artery disease and coronary bypass surgery in 2000. He had a cryptogenic stroke earlier this year and an implantable loop recorder was placed. He presents with approximately 3 days of mid scapular back pain and right sided anterior chest pain. He points to a focal area in the right chest and describes sharp shooting pains lasting no more than 30 seconds. The pain is not associated with physical exertion or any specific movement. There is no associated shortness of breath. There is no substernal chest pain or pressure. He does admit to exertional dyspnea and states that he attributed this to his age. He has been doing a lot of physical work as he is in the process of moving. He saw his primary care physician initially, underwent evaluation with EKG and blood work, and was prescribed a muscle relaxer. He was referred for outpatient cardiac evaluation but because of ongoing symptoms he presented to the emergency department. At the time of my evaluation he is chest pain-free. His wife and daughters are present at the bedside.  Medical History:  Past Medical History  Diagnosis Date  . Right bundle branch block 12/20/2013  . Hyperlipidemia 12/20/2013  . Essential hypertension 12/20/2013  . Coronary atherosclerosis of artery bypass graft 12/20/2013    CASHD with CABG 2000 with LIMA to LAD, SV graft to diagonal, SVG to OM, and saphenous vein graft to distal RCA.     Marland Kitchen GERD (gastroesophageal reflux disease)   . Stroke   . BPH (benign prostatic hyperplasia)       Surgical History:  Past Surgical History  Procedure Laterality Date  . Tee without  cardioversion N/A 04/02/2014    Procedure: TRANSESOPHAGEAL ECHOCARDIOGRAM (TEE);  Surgeon: Lelon Perla, MD;  Location: [facility] ENDOSCOPY;  Service: Cardiovascular;  Laterality: N/A;  . Prostate surgery  1985  . Hernia repair  2005  . Cardiac surgery  2000    BYPASS     Home Meds: Prior to Admission medications   Medication Sig Start Date End Date Taking? Authorizing Provider  amLODipine (NORVASC) 5 MG tablet TAKE 1 TABLET BY MOUTH EVERY DAY 04/18/14  Yes Belva Crome, MD  aspirin EC 81 MG tablet Take 1 tablet (81 mg total) by mouth daily. 07/05/14  Yes Rosalin Hawking, MD  atenolol (TENORMIN) 25 MG tablet Take 1 tablet (25 mg total) by mouth 2 (two) times daily. Patient taking differently: Take 25 mg by mouth every evening.  04/17/13  Yes Belva Crome, MD  atorvastatin (LIPITOR) 80 MG tablet Take 1 tablet (80 mg total) by mouth daily. 10/31/14  Yes Rosalin Hawking, MD  clopidogrel (PLAVIX) 75 MG tablet Take 75 mg by mouth daily.   Yes Historical Provider, MD  ezetimibe (ZETIA) 10 MG tablet Take 10 mg by mouth every evening.    Yes Historical Provider, MD  finasteride (PROSCAR) 5 MG tablet Take 0.5 tablets (2.5 mg total) by mouth daily. 04/17/13  Yes Belva Crome, MD  hydrochlorothiazide (HYDRODIURIL) 25 MG tablet Take 12.5 mg by mouth daily.   Yes Historical Provider, MD  lisinopril (PRINIVIL,ZESTRIL) 40 MG tablet Take 1 tablet (40 mg total) by mouth daily. 04/17/13  Yes Belva Crome,  MD  metFORMIN (GLUCOPHAGE) 500 MG tablet Take 0.5 tablets (250 mg total) by mouth daily with breakfast. 04/17/13  Yes Belva Crome, MD  Multiple Vitamins-Minerals (PRESERVISION AREDS 2) CAPS Take 1 capsule by mouth daily. Patient taking differently: Take 1 capsule by mouth 2 (two) times daily.  04/17/13  Yes Belva Crome, MD  pantoprazole (PROTONIX) 40 MG tablet Take 40 mg by mouth daily.   Yes Historical Provider, MD  Multiple Vitamin (MULTI VITAMIN DAILY PO) Take 1 tablet by mouth daily.    Historical Provider, MD     Inpatient Medications:  . amLODipine  10 mg Oral Daily  . aspirin  324 mg Oral Once  . aspirin EC  81 mg Oral Daily  . atenolol  25 mg Oral QPM  . atorvastatin  80 mg Oral Daily  . clopidogrel  75 mg Oral Daily  . ezetimibe  10 mg Oral QPM  . finasteride  2.5 mg Oral Daily  . heparin  5,000 Units Subcutaneous 3 times per day  . lisinopril  40 mg Oral Daily  . multivitamin with minerals  1 tablet Oral Daily  . multivitamin-lutein  1 capsule Oral BID  . pantoprazole  40 mg Oral Daily  . sodium chloride  3 mL Intravenous Q12H      Allergies:  Allergies  Allergen Reactions  . Monosodium Glutamate Anaphylaxis    Social History   Social History  . Marital Status: Married    Spouse Name: N/A  . Number of Children: 3  . Years of Education: BSEE   Occupational History  . Not on file.   Social History Main Topics  . Smoking status: Never Smoker   . Smokeless tobacco: Never Used  . Alcohol Use: 0.0 oz/week    0 Standard drinks or equivalent per week     Comment: SOCIAL  . Drug Use: No  . Sexual Activity: Not Currently   Other Topics Concern  . Not on file   Social History Narrative   Patient is married with 3 children.   Patient is right handed.   Patient has his B[employer] degree.   Patient drinks 2 cups daily.     Family History  Problem Relation Age of Onset  . Hypertension Mother      Review of Systems: General: negative for chills, fever, night sweats or weight changes.  ENT: negative for rhinorrhea or epistaxis Cardiovascular: negative for edema, orthopnea, palpitations, or paroxysmal nocturnal dyspnea Dermatological: negative for rash Respiratory: negative for cough or wheezing GI: negative for nausea, vomiting, diarrhea, bright red blood per rectum, melena, or hematemesis GU: no hematuria, urgency, or frequency Neurologic: negative for visual changes, syncope, headache, or dizziness Heme: no easy bruising or bleeding Endo: negative for excessive  thirst, thyroid disorder, or flushing Musculoskeletal: negative for joint pain or swelling, negative for myalgias  All other systems reviewed and are otherwise negative except as noted above.  Physical Exam: Blood pressure 128/50, pulse 52, temperature 98 F (36.7 C), temperature source Oral, resp. rate 19, height 5\' 11"  (1.803 m), weight 180 lb 1.9 oz (81.7 kg), SpO2 92 %. Pt is alert and oriented, WD, WN, pleasant elderly male in no distress. HEENT: normal Neck: JVP normal. Carotid upstrokes normal with bilateral bruits right > left. No thyromegaly. Lungs: equal expansion, clear bilaterally CV: Apex is discrete and nondisplaced, RRR without murmur or gallop Abd: soft, NT, +BS, no bruit, no hepatosplenomegaly Back: no CVA tenderness Ext: no C/C/E Skin: warm and dry without  rash Neuro: CNII-XII intact             Strength intact = bilaterally    Labs:  Recent Labs  12/04/14 2254 12/05/14 0427  TROPONINI <0.03 <0.03   Lab Results  Component Value Date   WBC 4.8 12/05/2014   HGB 11.6* 12/05/2014   HCT 34.9* 12/05/2014   MCV 91.8 12/05/2014   PLT 177 12/05/2014    Recent Labs Lab 12/05/14 0427  NA 140  K 4.3  CL 107  CO2 26  BUN 20  CREATININE 1.23  CALCIUM 8.6*  PROT 5.2*  BILITOT 0.9  ALKPHOS 64  ALT 16*  AST 22  GLUCOSE 106*   Lab Results  Component Value Date   CHOL 81 12/05/2014   HDL 29* 12/05/2014   LDLCALC 35 12/05/2014   TRIG 83 12/05/2014   No results found for: DDIMER  Radiology/Studies:  Dg Chest 2 View  12/04/2014   CLINICAL DATA:  Chest pain  EXAM: CHEST  2 VIEW  COMPARISON:  12/04/2014  FINDINGS: The lungs are hyperinflated likely secondary to COPD. There is mild left basilar atelectasis. There is no focal parenchymal opacity. There is no pleural effusion or pneumothorax. The heart and mediastinal contours are unremarkable. There is evidence of prior CABG.  The osseous structures are unremarkable.  IMPRESSION: No active cardiopulmonary  disease.   Electronically Signed   By: Kathreen Devoid   On: 12/04/2014 18:48   Ct Angio Chest Aorta W/cm &/or Wo/cm  12/04/2014   CLINICAL DATA:  Chest pain and back pain for 4 days. History of bypass surgery.  EXAM: CT ANGIOGRAPHY CHEST WITH CONTRAST  TECHNIQUE: Multidetector CT imaging of the chest was performed using the standard protocol during bolus administration of intravenous contrast. Multiplanar CT image reconstructions and MIPs were obtained to evaluate the vascular anatomy.  CONTRAST:  145mL OMNIPAQUE IOHEXOL 350 MG/ML SOLN  COMPARISON:  None.  FINDINGS: Unenhanced CT images of the chest demonstrate calcification of the thoracic aorta. No evidence of intramural hematoma. Coronary artery calcifications. Postoperative changes consistent with Coronary bypass.  Arterial phase contrast-enhanced images of the chest demonstrate mild cardiac enlargement with right heart and left atrial dilatation. Normal caliber thoracic aorta. No evidence of aortic dissection or aneurysm. Great vessel origins are patent. Central and segmental pulmonary arteries are well opacified. No focal filling defects demonstrated. No evidence of significant pulmonary embolus.  Esophagus is decompressed. Small esophageal hiatal hernia. Scattered lymph nodes in the mediastinum are not pathologically enlarged.  Atelectasis in the lung bases. Scattered focal nodular ground-glass opacities measuring less than 5 mm, likely inflammatory. No pneumothorax. No pleural effusions.  Included portions of the upper abdominal organs demonstrate no gross abnormality. No destructive bone lesions.  Review of the MIP images confirms the above findings.  IMPRESSION: No evidence of aortic dissection or pulmonary embolus. Cardiac enlargement. Patchy nodular infiltrative changes in the lungs, likely inflammatory.   Electronically Signed   By: Lucienne Capers M.D.   On: 12/04/2014 21:37    EKG: Normal sinus rhythm with right bundle branch block, unchanged  from previous tracings  Cardiac Studies: 2-D echocardiogram currently pending  CT Chest:  IMPRESSION: No evidence of aortic dissection or pulmonary embolus. Cardiac enlargement. Patchy nodular infiltrative changes in the lungs, likely inflammatory.  ASSESSMENT AND PLAN:  This is a 81 year old gentleman with known coronary artery disease status post CABG who presents with atypical chest pain. His history is carefully reviewed. There is no laboratory evidence of myonecrosis with normal  serial troponins. His EKG is unchanged from baseline. He does not have symptoms like those of his previous angina. Pending no major abnormalities on his echocardiogram, I think he could be discharged today with an outpatient nuclear study for further evaluation of chest pain. I suspect he has either muscle strain or costochondritis. CT angiography was reviewed and there was no evidence of pulmonary embolus or aortic path followed. Will make arrangements for his follow-up. Discussed recommendations with Dr. Sherral Hammers and with his primary cardiologist, Dr. Tamala Julian.  Deatra James MD, [facility] 12/05/2014, 12:42 PM

## 2014-12-05 NOTE — Evaluation (Signed)
Physical Therapy Evaluation Patient Details Name: [name] MRN: [id number] DOB: 14-Aug-1922 Today's Date: 12/05/2014   History of Present Illness  81 year old gentleman with coronary artery disease and coronary bypass surgery in 2000. He had a cryptogenic stroke earlier this year and an implantable loop recorder was placed. He presents with approximately 3 days of mid scapular back pain and right sided anterior chest pain. He points to a focal area in the right chest and describes sharp shooting pains lasting no more than 30 seconds. The pain is not associated with physical exertion or any specific movement. There is no associated shortness of breath. There is no substernal chest pain or pressure. He does admit to exertional dyspnea and states that he attributed this to his age. He has been doing a lot of physical work as he is in the process of moving. He saw his primary care physician initially, underwent evaluation with EKG and blood work, and was prescribed a muscle relaxer. He was referred for outpatient cardiac evaluation but because of ongoing symptoms he presented to the emergency department.  His wife and daughters are present at the bedside.   Clinical Impression  Pt admitted with above diagnosis. Pt currently with functional limitations due to the deficits listed below (see PT Problem List). Pt ambulates well and does not need assistance for ambulation on controlled setting.  Did not challenge pt fully due to this was first walk pt had completed since coming to hospital.  Will follow acutely to ensure pt continues mobilization.   Pt will benefit from skilled PT to increase their independence and safety with mobility to allow discharge to the venue listed below.    Follow Up Recommendations No PT follow up;Supervision - Intermittent    Equipment Recommendations  None recommended by PT    Recommendations for Other Services       Precautions / Restrictions Precautions Precautions:  Fall Restrictions Weight Bearing Restrictions: No      Mobility  Bed Mobility Overal bed mobility: Independent                Transfers Overall transfer level: Independent Equipment used: None                Ambulation/Gait Ambulation/Gait assistance: Supervision;Min guard Ambulation Distance (Feet): 200 Feet Assistive device: None Gait Pattern/deviations: Step-through pattern   Gait velocity interpretation: <1.8 ft/sec, indicative of risk for recurrent falls General Gait Details: No issues with ambulation without significant challenges to balance.  Pt could perform head turns and maintain balance.  HR fluctuating but not too much.    Stairs            Wheelchair Mobility    Modified Rankin (Stroke Patients Only)       Balance Overall balance assessment: Needs assistance         Standing balance support: No upper extremity supported;During functional activity Standing balance-Leahy Scale: Fair Standing balance comment: can stand statically without UE support.               High level balance activites: Direction changes;Turns;Head turns High Level Balance Comments: min guard assist for above.  no LOB.             Pertinent Vitals/Pain Pain Assessment: 0-10 Pain Score: 2  Pain Location: chest Pain Descriptors / Indicators: Aching;Dull Pain Intervention(s): Monitored during session;Limited activity within patient's tolerance;Repositioned  96% RA, 145/44, HR 55-80 bpm.      Home Living Family/patient expects to be discharged to:: Private  residence Living Arrangements: Spouse/significant other Available Help at Discharge: Family;Available PRN/intermittently (Pt  assists wife with ambulation as she holds onto him.) Type of Home: House Home Access: Stairs to enter Entrance Stairs-Rails: None Entrance Stairs-Number of Steps: 2 Home Layout: One level Home Equipment: None Additional Comments: Pt is selling their two homes to move into  [facility] I living.  Under a lot of stress.      Prior Function Level of Independence: Independent         Comments: Exercised at gym 3xweek. Drives.      Hand Dominance   Dominant Hand: Right    Extremity/Trunk Assessment   Upper Extremity Assessment: Defer to OT evaluation           Lower Extremity Assessment: Overall WFL for tasks assessed      Cervical / Trunk Assessment: Normal  Communication   Communication: No difficulties  Cognition Arousal/Alertness: Awake/alert Behavior During Therapy: WFL for tasks assessed/performed Overall Cognitive Status: Within Functional Limits for tasks assessed                      General Comments      Exercises        Assessment/Plan    PT Assessment Patient needs continued PT services  PT Diagnosis Generalized weakness   PT Problem List Decreased balance;Decreased activity tolerance;Decreased mobility;Decreased safety awareness;Decreased knowledge of use of DME;Decreased knowledge of precautions  PT Treatment Interventions DME instruction;Gait training;Functional mobility training;Therapeutic activities;Therapeutic exercise;Balance training;Stair training;Patient/family education   PT Goals (Current goals can be found in the Care Plan section) Acute Rehab PT Goals Patient Stated Goal: to go home PT Goal Formulation: With patient Time For Goal Achievement: 12/12/14 Potential to Achieve Goals: Good    Frequency Min 3X/week   Barriers to discharge Decreased caregiver support (Pt helps wife with ambulation- just steadies her)      Co-evaluation               End of Session Equipment Utilized During Treatment: Gait belt Activity Tolerance: Patient limited by fatigue Patient left: with call bell/phone within reach;in bed;with family/visitor present Nurse Communication: Mobility status    Functional Assessment Tool Used: clinical judgment Functional Limitation: Mobility: Walking and moving  around Mobility: Walking and Moving Around Current Status (P5945): At least 1 percent but less than 20 percent impaired, limited or restricted Mobility: Walking and Moving Around Goal Status [phone]): 0 percent impaired, limited or restricted    Time: 2446-2863 PT Time Calculation (min) (ACUTE ONLY): 19 min   Charges:   PT Evaluation $Initial PT Evaluation Tier I: 1 Procedure     PT G Codes:   PT G-Codes **NOT FOR INPATIENT CLASS** Functional Assessment Tool Used: clinical judgment Functional Limitation: Mobility: Walking and moving around Mobility: Walking and Moving Around Current Status (O1771): At least 1 percent but less than 20 percent impaired, limited or restricted Mobility: Walking and Moving Around Goal Status [phone]): 0 percent impaired, limited or restricted    Irwin Brakeman F 12/05/2014, 1:25 PM  [facility] Acute Rehabilitation [phone] (pager)

## 2014-12-06 DIAGNOSIS — Z85828 Personal history of other malignant neoplasm of skin: Secondary | ICD-10-CM | POA: Diagnosis not present

## 2014-12-06 DIAGNOSIS — B029 Zoster without complications: Secondary | ICD-10-CM | POA: Diagnosis not present

## 2014-12-06 LAB — HEMOGLOBIN A1C
Hgb A1c MFr Bld: 6.6 % — ABNORMAL HIGH (ref 4.8–5.6)
Mean Plasma Glucose: 143 mg/dL

## 2014-12-10 DIAGNOSIS — K5900 Constipation, unspecified: Secondary | ICD-10-CM | POA: Diagnosis not present

## 2014-12-10 DIAGNOSIS — I10 Essential (primary) hypertension: Secondary | ICD-10-CM | POA: Diagnosis not present

## 2014-12-10 DIAGNOSIS — E785 Hyperlipidemia, unspecified: Secondary | ICD-10-CM | POA: Diagnosis not present

## 2014-12-10 DIAGNOSIS — B029 Zoster without complications: Secondary | ICD-10-CM | POA: Diagnosis not present

## 2014-12-11 ENCOUNTER — Encounter (HOSPITAL_COMMUNITY): Payer: Medicare Other

## 2014-12-19 DIAGNOSIS — H3532 Exudative age-related macular degeneration: Secondary | ICD-10-CM | POA: Diagnosis not present

## 2014-12-20 DIAGNOSIS — Z23 Encounter for immunization: Secondary | ICD-10-CM | POA: Diagnosis not present

## 2014-12-26 ENCOUNTER — Encounter: Payer: Self-pay | Admitting: Interventional Cardiology

## 2014-12-26 DIAGNOSIS — I480 Paroxysmal atrial fibrillation: Secondary | ICD-10-CM | POA: Insufficient documentation

## 2014-12-29 ENCOUNTER — Telehealth: Payer: Self-pay | Admitting: Internal Medicine

## 2014-12-29 NOTE — Telephone Encounter (Signed)
Pt with remote h/o CABG.  Currently on ASA and recently Plavix by PCP for stroke.  Review reveals prior stroke was felt to be embolic. Now with afib on loop recorder.  [name],  please stop ASA and plavix and start xarelto 15mg  daily (CrCl 47). Follow-up with Gay Filler in the anticoagulation clinic in 4 weeks.  I will inform primary cardiologist and neurologist of these changes.

## 2014-12-30 ENCOUNTER — Telehealth: Payer: Self-pay | Admitting: *Deleted

## 2014-12-30 DIAGNOSIS — I480 Paroxysmal atrial fibrillation: Secondary | ICD-10-CM

## 2014-12-30 MED ORDER — RIVAROXABAN 15 MG PO TABS: 15.0000 mg | ORAL_TABLET | Freq: Every day | ORAL | Status: DC

## 2014-12-30 NOTE — Telephone Encounter (Signed)
Hi, Dr. Rayann Heman:  Thanks much for letting me know.   Jindong

## 2014-12-30 NOTE — Telephone Encounter (Signed)
Able to reach patient on mobile phone number.  He reports that he is moving into [facility] this week so all of his belongings are in boxes.  Relayed to patient that atrial fibrillation was seen on his LINQ recorder and that per Dr. Rayann Heman, he should discontinue taking aspirin and Plavix and start taking Xarelto 15mg  daily.  Medication electronically sent to his pharmacy.  Reviewed medication changes with patient and he voices understanding of all instructions.  Will make appointment with [facility] tomorrow for follow-up in 4 weeks per Dr. Rayann Heman and call patient back.  Patient states he will think of any questions that he or his family may have and ask me tomorrow when we speak again.  Patient denies any additional questions or concerns at this time.

## 2014-12-30 NOTE — Telephone Encounter (Signed)
Unable to reach patient at home phone number (straight to VM).

## 2014-12-31 ENCOUNTER — Telehealth: Payer: Self-pay

## 2014-12-31 NOTE — Telephone Encounter (Signed)
Tried both numbers listed for pt. Pt home# goes straight to voicemail. lmtcb on cell.  Pt NOAC (new Xarelto)appt scheduled with the coumadin clinic for 10/20 @10 :30

## 2015-01-02 ENCOUNTER — Encounter: Payer: Medicare Other | Admitting: Nurse Practitioner

## 2015-01-02 MED ORDER — AMLODIPINE BESYLATE 5 MG PO TABS: 5.0000 mg | ORAL_TABLET | Freq: Every day | ORAL | Status: DC

## 2015-01-02 NOTE — Telephone Encounter (Signed)
Returned patient's phone call regarding questions about his loop recorder.  Able to answer all of his questions.  Reiterated need to pick up Xarelto and begin taking it today.  Reviewed upcoming follow-up appointments with the [facility] and Dr. Tamala Julian.  Patient voices understanding of all instructions and denies additional questions or concerns at this time.  States he will call if he thinks of additional questions.

## 2015-01-02 NOTE — Telephone Encounter (Signed)
Returned pt call. lmtcb call back if assistance is still needed

## 2015-01-02 NOTE — Telephone Encounter (Signed)
Follow up      Returning a call to [facility]

## 2015-01-02 NOTE — Telephone Encounter (Signed)
Pt aware of appt the pharmacist on 10/20. Pt sts that he is moving and would like for me to go over his med list with his for clarification. Pt sts that he has not picked up and started Xarelto, he has been in the process of moving Pt sts that he will locate his list and call back.

## 2015-01-02 NOTE — Telephone Encounter (Signed)
Spoke with pt and verified all med dosages med list updated. Pt sts that he will pick up Xarelto and start today. Pt stopped Asa on 9/19 but continued Plavix. Adv pt again to d/c plavix. Pt has several questions regarding his link recorder transmission, report was not available in pt's chart. Adv pt that I will fwd a message to the device clinic to call him. Pt voiced appreciation and verbalized understanding.

## 2015-01-07 NOTE — Telephone Encounter (Signed)
Xarelto rx for pt, per pharmacy pt does not have rx insurance therefor wants different medication besides Xarelto rx.  Per Dr Rayann Heman initiate Coumadin and refer to Coumadin Clinic.  Called pt

## 2015-01-07 NOTE — Telephone Encounter (Signed)
Called pt, he get meds through [place], needs information on medication Xarelto and necessity sent to [facility] attn Dr Claude Manges pt's primary MD from Dr Rayann Heman.  Pt WCB with fax number to send info to.

## 2015-01-08 NOTE — Telephone Encounter (Signed)
Spoke with pt.  Dr. Georges Lynch fax number is [phone].  Will fax information to him to approve Xarelto.

## 2015-01-10 ENCOUNTER — Ambulatory Visit (INDEPENDENT_AMBULATORY_CARE_PROVIDER_SITE_OTHER): Payer: Medicare Other | Admitting: *Deleted

## 2015-01-10 DIAGNOSIS — I639 Cerebral infarction, unspecified: Secondary | ICD-10-CM

## 2015-01-14 NOTE — Progress Notes (Signed)
Loop recorder 

## 2015-01-15 ENCOUNTER — Encounter: Payer: Self-pay | Admitting: Internal Medicine

## 2015-01-23 DIAGNOSIS — L821 Other seborrheic keratosis: Secondary | ICD-10-CM | POA: Diagnosis not present

## 2015-01-23 DIAGNOSIS — D225 Melanocytic nevi of trunk: Secondary | ICD-10-CM | POA: Diagnosis not present

## 2015-01-23 DIAGNOSIS — B0229 Other postherpetic nervous system involvement: Secondary | ICD-10-CM | POA: Diagnosis not present

## 2015-01-23 DIAGNOSIS — L570 Actinic keratosis: Secondary | ICD-10-CM | POA: Diagnosis not present

## 2015-01-23 DIAGNOSIS — D1801 Hemangioma of skin and subcutaneous tissue: Secondary | ICD-10-CM | POA: Diagnosis not present

## 2015-01-23 DIAGNOSIS — Z85828 Personal history of other malignant neoplasm of skin: Secondary | ICD-10-CM | POA: Diagnosis not present

## 2015-01-30 ENCOUNTER — Ambulatory Visit (INDEPENDENT_AMBULATORY_CARE_PROVIDER_SITE_OTHER): Payer: Medicare Other | Admitting: *Deleted

## 2015-01-30 DIAGNOSIS — I4891 Unspecified atrial fibrillation: Secondary | ICD-10-CM | POA: Diagnosis not present

## 2015-01-30 NOTE — Progress Notes (Signed)
Pt was started on Xarelto 15mg  daily for AFIB on 01/30/15.  Patient did not start the Xarelto as instructed in September by Dr. Rayann Heman. The patient states that he was unsure why he has to take Xarelto; explained to the patient the purpose of Xarelto and the risks/benefits and he verbalized understanding.  Patient states that the cost is not effective and he is sure the [place] will not cover it.  We called the [place] clinic and was unable to get through to them.  We will continue to call and update the patient.  He was provided with samples until the [facility] hospital in [place].  Last labs on 12/05/14: Hb-11.6, Hgb-34.9, Creatinine-1.25, Age-58, Crcl-45.39   Reviewed patients medication list.  Pt is not currently on any combined P-gp and strong CYP3A4 inhibitors/inducers (ketoconazole, traconazole, ritonavir, carbamazepine, phenytoin, rifampin, St. John's wort).    A full discussion of the nature of anticoagulants has been carried out.  A benefit/risk analysis has been presented to the patient, so that they understand the justification for choosing anticoagulation with Xarelto at this time.  The need for compliance is stressed.  Pt is aware to take the medication once daily with the largest meal of the day.  Side effects of potential bleeding are discussed, including unusual colored urine or stools, coughing up blood or coffee ground emesis, nose bleeds or serious fall or head trauma.  Discussed signs and symptoms of stroke. The patient should avoid any OTC items containing aspirin or ibuprofen.  Avoid alcohol consumption.   Call if any signs of abnormal bleeding.  Discussed financial obligations and resolved any difficulty in obtaining medication.  Follow up appointment set for 1 month.

## 2015-02-03 LAB — CUP PACEART REMOTE DEVICE CHECK: MDC IDC SESS DTM: 20160930203603

## 2015-02-03 NOTE — Progress Notes (Signed)
Carelink summary report received. Battery status OK. Normal device function. No new symptom episodes, tachy episodes, brady, or pause episodes. 19 AF episodes (burden 0.7%), +Xarelto, avg V rate well controlled. Monthly summary reports and ROV with JA in 07/2015.

## 2015-02-05 DIAGNOSIS — Z7901 Long term (current) use of anticoagulants: Secondary | ICD-10-CM | POA: Insufficient documentation

## 2015-02-06 ENCOUNTER — Encounter: Payer: Self-pay | Admitting: Interventional Cardiology

## 2015-02-06 ENCOUNTER — Ambulatory Visit (INDEPENDENT_AMBULATORY_CARE_PROVIDER_SITE_OTHER): Payer: Medicare Other | Admitting: Interventional Cardiology

## 2015-02-06 VITALS — BP 134/64 | HR 50 | Ht 71.0 in | Wt 180.8 lb

## 2015-02-06 DIAGNOSIS — E785 Hyperlipidemia, unspecified: Secondary | ICD-10-CM

## 2015-02-06 DIAGNOSIS — I639 Cerebral infarction, unspecified: Secondary | ICD-10-CM | POA: Diagnosis not present

## 2015-02-06 DIAGNOSIS — I25700 Atherosclerosis of coronary artery bypass graft(s), unspecified, with unstable angina pectoris: Secondary | ICD-10-CM | POA: Diagnosis not present

## 2015-02-06 DIAGNOSIS — Z7901 Long term (current) use of anticoagulants: Secondary | ICD-10-CM | POA: Diagnosis not present

## 2015-02-06 DIAGNOSIS — I480 Paroxysmal atrial fibrillation: Secondary | ICD-10-CM | POA: Diagnosis not present

## 2015-02-06 DIAGNOSIS — I4510 Unspecified right bundle-branch block: Secondary | ICD-10-CM

## 2015-02-06 DIAGNOSIS — I63412 Cerebral infarction due to embolism of left middle cerebral artery: Secondary | ICD-10-CM | POA: Diagnosis not present

## 2015-02-06 MED ORDER — ATORVASTATIN CALCIUM 40 MG PO TABS: 40.0000 mg | ORAL_TABLET | Freq: Every day | ORAL | Status: DC

## 2015-02-06 NOTE — Patient Instructions (Signed)
Your physician has recommended you make the following change in your medication:  1.) decrease lipitor to 40 mg once daily (ok to cut your current tablets in 1/2)  Your physician wants you to follow-up in: 6 months with Dr. Tamala Julian.  You will receive a reminder letter in the mail two months in advance. If you don't receive a letter, please call our office to schedule the follow-up appointment.

## 2015-02-06 NOTE — Progress Notes (Signed)
Cardiology Office Note   Date:  02/06/2015   ID:  [name], DOB [birth year], MRN [id number]  PCP:  Jani Gravel, MD  Cardiologist:  Sinclair Grooms, MD   Chief Complaint  Patient presents with  . Atrial Fibrillation      History of Present Illness: [name] is a 81 y.o. male who presents for CAD with remote CABG, paroxysmal atrial fibrillation, prior embolic CVA, anticoagulation therapy with Xarelto, essential hypertension, and hyperlipidemia  Recently discovered PAF on loop recorder. Anticoagulation with Xarelto  was started and aspirin and Plavix discontinued.  Significant constipation on high-dose statin therapy.  Recent episode of chest pain led to negative cardiac workup. Was have outpatient nuclear study performed. The date he was discharged from the hospital, the diagnosis of shingles was made and was the source of the patient's back and chest discomfort.  Past Medical History  Diagnosis Date  . Right bundle branch block 12/20/2013  . Hyperlipidemia 12/20/2013  . Essential hypertension 12/20/2013  . Coronary atherosclerosis of artery bypass graft 12/20/2013    CASHD with CABG 2000 with LIMA to LAD, SV graft to diagonal, SVG to OM, and saphenous vein graft to distal RCA.     Marland Kitchen GERD (gastroesophageal reflux disease)   . Stroke ([place])   . BPH (benign prostatic hyperplasia)     Past Surgical History  Procedure Laterality Date  . Tee without cardioversion N/A 04/02/2014    Procedure: TRANSESOPHAGEAL ECHOCARDIOGRAM (TEE);  Surgeon: Lelon Perla, MD;  Location: [facility] ENDOSCOPY;  Service: Cardiovascular;  Laterality: N/A;  . Prostate surgery  1985  . Hernia repair  2005  . Cardiac surgery  2000    BYPASS     Current Outpatient Prescriptions  Medication Sig Dispense Refill  . amLODipine (NORVASC) 5 MG tablet Take 1 tablet (5 mg total) by mouth daily.    Marland Kitchen atenolol (TENORMIN) 25 MG tablet Take 25 mg by mouth at bedtime.    Marland Kitchen ezetimibe (ZETIA) 10 MG tablet  Take 5 mg by mouth at bedtime.    . finasteride (PROSCAR) 5 MG tablet Take 0.5 tablets (2.5 mg total) by mouth daily.    . hydrochlorothiazide (HYDRODIURIL) 25 MG tablet Take 12.5 mg by mouth every other day.    . lisinopril (PRINIVIL,ZESTRIL) 40 MG tablet Take 1 tablet (40 mg total) by mouth daily.    . metFORMIN (GLUCOPHAGE) 500 MG tablet Take 0.5 tablets (250 mg total) by mouth daily with breakfast.    . Multiple Vitamin (MULTI VITAMIN DAILY PO) Take 1 tablet by mouth daily.    . Multiple Vitamins-Minerals (PRESERVISION AREDS 2 PO) Take 1 capsule by mouth 2 (two) times daily.     . pantoprazole (PROTONIX) 40 MG tablet Take 40 mg by mouth daily.    . Rivaroxaban (XARELTO) 15 MG TABS tablet Take 1 tablet (15 mg total) by mouth daily with supper. 30 tablet 12  . traMADol (ULTRAM) 50 MG tablet Take 50 mg by mouth every 6 (six) hours as needed. (PAIN)  0   No current facility-administered medications for this visit.    Allergies:   Monosodium glutamate    Social History:  The patient  reports that he has never smoked. He has never used smokeless tobacco. He reports that he drinks alcohol. He reports that he does not use illicit drugs.   Family History:  The patient's family history includes Hypertension in his mother.    ROS:  Please see the history of present  illness.   Otherwise, review of systems are positive for frustrated by office appointments. No new neurological complaints. Denies syncope. Did not do stress study because explanation for chest discomfort was identified..   All other systems are reviewed and negative.    PHYSICAL EXAM: VS:  BP 134/64 mmHg  Pulse 50  Ht 5\' 11"  (1.803 m)  Wt 82.01 kg (180 lb 12.8 oz)  BMI 25.23 kg/m2  SpO2 98% , BMI Body mass index is 25.23 kg/(m^2). GEN: Well nourished, well developed, in no acute distress HEENT: normal Neck: no JVD, carotid bruits, or masses Cardiac: RRR.  There is no murmur, rub, or gallop. There is no edema. Respiratory:   clear to auscultation bilaterally, normal work of breathing. GI: soft, nontender, nondistended, + BS MS: no deformity or atrophy Skin: warm and dry, no rash Neuro:  Strength and sensation are intact Psych: euthymic mood, full affect   EKG:  EKG is not ordered today. The ekg reveals no sinus rhythm with right bundle branch block when performed in August 2016. No acute changes noted.   Recent Labs: 12/05/2014: ALT 16*; BUN 20; Creatinine, Ser 1.23; Hemoglobin 11.6*; Platelets 177; Potassium 4.3; Sodium 140    Lipid Panel    Component Value Date/Time   CHOL 81 12/05/2014 0427   TRIG 83 12/05/2014 0427   HDL 29* 12/05/2014 0427   CHOLHDL 2.8 12/05/2014 0427   VLDL 17 12/05/2014 0427   LDLCALC 35 12/05/2014 0427      Wt Readings from Last 3 Encounters:  02/06/15 82.01 kg (180 lb 12.8 oz)  12/05/14 81.7 kg (180 lb 1.9 oz)  10/22/14 84.369 kg (186 lb)      Other studies Reviewed: Additional studies/ records that were reviewed today include: 2 recent hospital stays including the hospital stay for chest pain.. The findings include all data was reviewed including EKGs. We canceled the patient's planned stress test because he developed shingles in the distribution of chest pain that is sealing the diagnosis/etiology..    ASSESSMENT AND PLAN:  1. Cryptogenic stroke ([place]) Likely related to paroxysmal atrial fibrillation since the arrhythmia has not been identified on loop recorder.  2. Atherosclerosis of coronary artery bypass graft with unstable angina pectoris, unspecified whether native or transplanted heart ([place]) Asymptomatic. Recent admission for chest discomfort led to the diagnosis of shingles.  3. Paroxysmal atrial fibrillation ([facility]) Very infrequent and recently identified on loop recording.   4. Chronic anticoagulation Anticoagulation therapy is been started and no complications occurred.  5. Right bundle branch block Unchanged based on most recent study  6.  Hyperlipidemia On therapy.  7. Shingles Right shoulder and left chest discomfort led to hospitalization and myocardial infarction rule out.   Current medicines are reviewed at length with the patient today.  The patient has the following concerns regarding medicines: Concerning anticoagulation. We discussed the need for continuous therapy over time. He now understands that this will be a permanent therapy and less complications..  The following changes/actions have been instituted:    Decrease atorvastatin to 40 mg per day  Device clinic today to interrogate loop recorder and clarify whether he needs office follow-ups or remote only.  HCTZ 12.5 mg every other day will be added to the medication list  Xarelto 15 mg daily which he will get from the [place]  Follow-up with me in 6 months  Labs/ tests ordered today include:  No orders of the defined types were placed in this encounter.     Disposition:  FU with HS in 6 months  Signed, Sinclair Grooms, MD  02/06/2015 11:46 AM    [facility] [place], [place], [place]  [address] Phone: [phone]; Fax: [phone]

## 2015-02-09 DIAGNOSIS — I639 Cerebral infarction, unspecified: Secondary | ICD-10-CM

## 2015-02-10 ENCOUNTER — Ambulatory Visit (INDEPENDENT_AMBULATORY_CARE_PROVIDER_SITE_OTHER): Payer: Medicare Other | Admitting: *Deleted

## 2015-02-10 DIAGNOSIS — I639 Cerebral infarction, unspecified: Secondary | ICD-10-CM

## 2015-02-11 NOTE — Progress Notes (Signed)
Loop recorder 

## 2015-02-20 ENCOUNTER — Encounter: Payer: Self-pay | Admitting: Internal Medicine

## 2015-02-27 ENCOUNTER — Ambulatory Visit (INDEPENDENT_AMBULATORY_CARE_PROVIDER_SITE_OTHER): Payer: Medicare Other | Admitting: *Deleted

## 2015-02-27 DIAGNOSIS — I4891 Unspecified atrial fibrillation: Secondary | ICD-10-CM | POA: Diagnosis not present

## 2015-02-27 LAB — BASIC METABOLIC PANEL
BUN: 27 mg/dL — AB (ref 7–25)
CHLORIDE: 108 mmol/L (ref 98–110)
CO2: 23 mmol/L (ref 20–31)
CREATININE: 1.15 mg/dL — AB (ref 0.70–1.11)
Calcium: 8.6 mg/dL (ref 8.6–10.3)
GLUCOSE: 109 mg/dL — AB (ref 65–99)
POTASSIUM: 4.4 mmol/L (ref 3.5–5.3)
Sodium: 142 mmol/L (ref 135–146)

## 2015-02-27 LAB — CBC
HEMATOCRIT: 34.8 % — AB (ref 39.0–52.0)
HEMOGLOBIN: 11.7 g/dL — AB (ref 13.0–17.0)
MCH: 30.7 pg (ref 26.0–34.0)
MCHC: 33.6 g/dL (ref 30.0–36.0)
MCV: 91.3 fL (ref 78.0–100.0)
MPV: 9.7 fL (ref 8.6–12.4)
Platelets: 264 10*3/uL (ref 150–400)
RBC: 3.81 MIL/uL — ABNORMAL LOW (ref 4.22–5.81)
RDW: 14.2 % (ref 11.5–15.5)
WBC: 6.8 10*3/uL (ref 4.0–10.5)

## 2015-02-27 NOTE — Progress Notes (Signed)
Pt was started on Xarelto 15mg  daily for Afib on 01/30/15 by Dr. Tamala Julian.    Reviewed patients medication list.  Pt is not currently on any combined P-gp and strong CYP3A4 inhibitors/inducers (ketoconazole, traconazole, ritonavir, carbamazepine, phenytoin, rifampin, St. John's wort).  Reviewed labs: SCr-1.15,Hgb-11.7, HCT-34.8, Weight-83.5kg, CrCl-50.42; and dose appropriate based on CrCl and dosing criteria.     A full discussion of the nature of anticoagulants has been carried out.  A benefit/risk analysis has been presented to the patient, so that they understand the justification for choosing anticoagulation with Xarelto at this time.  The need for compliance is stressed.  Pt is aware to take the medication once daily with the largest meal of the day.  Side effects of potential bleeding are discussed, including unusual colored urine or stools, coughing up blood or coffee ground emesis, nose bleeds or serious fall or head trauma.  Discussed signs and symptoms of stroke. The patient should avoid any OTC items containing aspirin or ibuprofen.  Avoid alcohol consumption.   Call if any signs of abnormal bleeding.  Discussed financial obligations and resolved any difficulty in obtaining medication.    Patient is concerned about the cost because he cannot afford the medication.  He states that he will not take the medication if he has to pay for it.  Also, he states he has not heard back from the [facility] about the cost.  Also, the [facility] clinic has not either.  The patient is still concerned that he will not be able to afford the medication therefore samples were given today.  He stated he was going to go to the [place] clinic in [place] next week to get an answer.  Advised that we really need to get in touch with the [facility] so that we can ensure that they will cover the cost of Xarelto.   On 02/27/15-The patient was sent to the lab for CBC & BMET labs. Instructed the patient that we would call him with lab  results. 02/28/15- Spoke with patient regarding lab results and advised to continue taking Xarelto 15mg  daily.  He is still concerned that he will not be able to afford the medication after his samples are gone.  Advised that I have been calling the [facility] and have been unsuccessful, too.    On 02/28/15-Spoke with Judeen Hammans, at the [facility] regarding attempting to contact the [facility] regarding Xarelto coverage.  I have been trying to contact them regarding the patient's Xarelto since last month.  She stated this is the first message they have received about the patient needing assistance with Xarelto coverage.  She advised that they need a letter faxed to them regarding the reason the patient is on Xarelto, who prescribed the Xarelto, when it was changed, and state that the patient requests that the [place] covers the medication and fax to the [place]. Information given to Pharmacist-Sally, who will be faxing the information to the [facility].  Spoke with patient & advised that I was able to contact the [facility] and that they should be contacting him in a week or two regarding the Xarelto coverage. He verbalized understanding and on 03/03/15 he stated they contacted him on 02/28/15 and set an appointment for follow-up.  Advised to call us if he hears anything about the Xarelto approval and he stated he would do so.

## 2015-03-03 ENCOUNTER — Telehealth: Payer: Self-pay

## 2015-03-03 NOTE — Telephone Encounter (Signed)
Pt aware of lab results. The basic metabolic panel is normal. Okay to continue the diuretic as prescribed Pt verbalized understanding.

## 2015-03-03 NOTE — Telephone Encounter (Signed)
-----   Message from Belva Crome, MD sent at 02/28/2015  3:11 PM EST ----- The basic metabolic panel is normal. Okay to continue the diuretic as prescribed

## 2015-03-07 LAB — CUP PACEART REMOTE DEVICE CHECK: MDC IDC SESS DTM: 20161030210613

## 2015-03-07 NOTE — Progress Notes (Signed)
Carelink summary report received. Battery status OK. Normal device function. No new symptom, brady, pause, or AF episodes, +Xarelto. 3 tachy episodes--ECGs show artifact. Monthly summary reports and ROV with JA in 07/2015.

## 2015-03-11 ENCOUNTER — Ambulatory Visit (INDEPENDENT_AMBULATORY_CARE_PROVIDER_SITE_OTHER): Payer: Medicare Other | Admitting: *Deleted

## 2015-03-11 DIAGNOSIS — I639 Cerebral infarction, unspecified: Secondary | ICD-10-CM | POA: Diagnosis not present

## 2015-03-12 NOTE — Progress Notes (Signed)
Carelink Summary Report / Loop Recorder 

## 2015-03-20 DIAGNOSIS — H353221 Exudative age-related macular degeneration, left eye, with active choroidal neovascularization: Secondary | ICD-10-CM | POA: Diagnosis not present

## 2015-03-20 DIAGNOSIS — H43812 Vitreous degeneration, left eye: Secondary | ICD-10-CM | POA: Diagnosis not present

## 2015-04-08 ENCOUNTER — Telehealth: Payer: Self-pay | Admitting: Interventional Cardiology

## 2015-04-08 DIAGNOSIS — L03115 Cellulitis of right lower limb: Secondary | ICD-10-CM | POA: Diagnosis not present

## 2015-04-08 DIAGNOSIS — B027 Disseminated zoster: Secondary | ICD-10-CM | POA: Diagnosis not present

## 2015-04-08 NOTE — Telephone Encounter (Signed)
Forwarded to Jenean Lindau, RN.

## 2015-04-08 NOTE — Telephone Encounter (Signed)
Pt calling re approval for Xarelto [facility] the [facility]-Friday it was not approved-said he was old and suggested Eliquis  [phone]

## 2015-04-09 ENCOUNTER — Other Ambulatory Visit: Payer: Self-pay

## 2015-04-09 MED ORDER — APIXABAN 5 MG PO TABS: 5.0000 mg | ORAL_TABLET | Freq: Two times a day (BID) | ORAL | Status: DC

## 2015-04-09 NOTE — Telephone Encounter (Signed)
Patient is to call me back with info on how to obtain a PA from the [place].

## 2015-04-09 NOTE — Telephone Encounter (Signed)
Spoke with Elberta Leatherwood, pharm D about not getting Xarelto approved. She states it would be OK to switch to Eliquis, which the [place] claims they will cover. Patient advised. New Rx for Eliquis 5mg  bid #60 printed for the [place].

## 2015-04-10 ENCOUNTER — Ambulatory Visit (INDEPENDENT_AMBULATORY_CARE_PROVIDER_SITE_OTHER): Payer: Medicare Other | Admitting: *Deleted

## 2015-04-10 DIAGNOSIS — I639 Cerebral infarction, unspecified: Secondary | ICD-10-CM

## 2015-04-11 NOTE — Progress Notes (Signed)
Carelink Summary Report / Loop Recorder 

## 2015-04-24 ENCOUNTER — Encounter: Payer: Self-pay | Admitting: Neurology

## 2015-04-24 ENCOUNTER — Ambulatory Visit (INDEPENDENT_AMBULATORY_CARE_PROVIDER_SITE_OTHER): Payer: Medicare Other | Admitting: Neurology

## 2015-04-24 VITALS — BP 131/54 | HR 52 | Ht 71.0 in | Wt 185.6 lb

## 2015-04-24 DIAGNOSIS — I480 Paroxysmal atrial fibrillation: Secondary | ICD-10-CM

## 2015-04-24 DIAGNOSIS — E785 Hyperlipidemia, unspecified: Secondary | ICD-10-CM

## 2015-04-24 DIAGNOSIS — I63412 Cerebral infarction due to embolism of left middle cerebral artery: Secondary | ICD-10-CM

## 2015-04-24 DIAGNOSIS — I10 Essential (primary) hypertension: Secondary | ICD-10-CM

## 2015-04-24 DIAGNOSIS — Z7901 Long term (current) use of anticoagulants: Secondary | ICD-10-CM

## 2015-04-24 NOTE — Patient Instructions (Addendum)
-   switch from Xarelto once a day to eliquis 5mg  twice a day for stroke prevention.  - call your pharmacy and find out the eliquis status as well as pushing [facility] for the medication - continue lipitor for stroke  - continue zetia for HLD - Follow up with your primary care physician for stroke risk factor modification. Recommend maintain blood pressure goal around 130/80, diabetes with hemoglobin A1c goal below 6.5% and lipids with LDL cholesterol goal below 70 mg/dL.  - check BP at home. - follow up in 6 months

## 2015-04-24 NOTE — Progress Notes (Signed)
STROKE NEUROLOGY FOLLOW UP NOTE  NAME: JERRARD SPONAUGLE DOB: 05-25-1922  REASON FOR VISIT: stroke follow up HISTORY FROM: pt and chart  Today we had the pleasure of seeing [name] in follow-up at our Neurology Clinic. Pt was accompanied by no one.   History Summary [name] is a 82 y.o. male with history of hypertension, hyperlipidemia, coronary artery disease with coronary artery bypass graft surgery in 2000 was admitted on 03/30/14 with generalized weakness after falling. He did not lose consciousness. No speech abnormalities were noted. He finally got some help to get to the bed and EMS was called.MRI showed punctate left MCA and ACA territory infarcts, embolic pattern. CTA also showed moderate to severe bilateral ICA siphon stenosis with calcified plaques as well as diffuse athero of intrancranial vessels. TTE and TEE unremarkable. His symptoms resolved and he was discharged with ASA and pravastatin.   06/17/14 follow up - the patient has been doing well. His PCP changed him from ASA to plavix. No recurrent strokes. His Bp in clinic today 156/60. He stated that at home his BP normally 130/60. He has avastin injection for macular degeneration. He also enrolled into a study and had loop recorder placed at office setting as an outpt. So far no afib episodes found.   10/22/14 follow up - the patient has been doing well. No recurrent stroke like symptoms. Finished off avastin injection for macular degeneration and vision is better. Loop recorder showed no afib so far. Currently on ASA and plavix. BP today 139/54. However, he stated that around mid morning, he feels some dizziness but at lunch time, he is OK. He is taking 4 BP meds all in the morning, which could be explanation.  Interval History During the interval time, the patient has been doing well. No recurrent stroke like symptoms. He was found to have pAfib on loop recorder in 12/2014. He was put on Xarelto and stopped ASA  and plavix. However, [facility] not [facility] but OK to cover for eliquis. He is currently still on Xarelto 15mg  daily while waiting for eliquis to be shipped out from [place]. BP stable 131/54. No other complains.  REVIEW OF SYSTEMS: Full 14 system review of systems performed and notable only for those listed below and in HPI above, all others are negative:  Constitutional:   Cardiovascular: leg swelling Ear/Nose/Throat:  Runny nose Skin: rash, itching Eyes:   Respiratory:   Gastroitestinal:   Genitourinary:  Hematology/Lymphatic:  Bleeding easily Endocrine:  Musculoskeletal:   Allergy/Immunology:   Neurological:   Psychiatric:  Sleep:   The following represents the patient's updated allergies and side effects list: Allergies  Allergen Reactions  . Monosodium Glutamate Anaphylaxis    The neurologically relevant items on the patient's problem list were reviewed on today's visit.  Neurologic Examination  A problem focused neurological exam (12 or more points of the single system neurologic examination, vital signs counts as 1 point, cranial nerves count for 8 points) was performed.  Blood pressure 131/54, pulse 52, height 5\' 11"  (1.803 m), weight 185 lb 9.6 oz (84.188 kg).  General - Well nourished, well developed, in no apparent distress.  Ophthalmologic - fundi not visualized due to small pupils.  Cardiovascular - Regular rate and rhythm with no murmur.  Mental Status -  Level of arousal and orientation to time, place, and person were intact. Language including expression, naming, repetition, comprehension was assessed and found intact.  Cranial Nerves II - XII -  II - Visual field intact OU III, IV, VI - Extraocular movements intact. V - Facial sensation intact bilaterally. VII - Facial movement intact bilaterally. VIII - Hearing & vestibular intact bilaterally. X - Palate elevates symmetrically. XI - Chin turning & shoulder shrug intact bilaterally. XII - Tongue  protrusion intact.  Motor Strength - The patient's strength was normal in all extremities and pronator drift was absent.  Bulk was normal and fasciculations were absent.   Motor Tone - Muscle tone was assessed at the neck and appendages and was normal.  Reflexes - The patient's reflexes were normal in all extremities and he had no pathological reflexes.  Sensory - Light touch, temperature/pinprick, vibration and proprioception, and Romberg testing were assessed and were normal.    Coordination - The patient had normal movements in the hands and feet with no ataxia or dysmetria.  Tremor was absent.  Gait and Station - mildly slow but steady without tendency to fall.  Data reviewed: I personally reviewed the images and agree with the radiology interpretations.  Ct Angio Head and Neck W/cm &/or Wo Cm 03/30/2014  1. Extracranial carotid arteries with no significant atherosclerotic stenosis, but tortuosity and a kinked appearance more so on the right.  2. Moderate to severe bilateral ICA siphon atherosclerotic stenosis related to bulky calcified plaque. High-grade stenosis of the right ICA anterior genu.  3. Otherwise mild anterior circulation atherosclerosis, mild right MCA M1 stenosis.  4. Overall moderate bilateral vertebral artery atherosclerosis in stenosis; moderate distal right vertebral artery and left vertebral artery origin stenoses.  5. Otherwise posterior circulation remarkable for mild to moderate left PCA atherosclerosis and stenosis.  6. Stable and negative CT appearance of the brain.   Chest 2 View 03/30/2014  Underlying emphysematous change. No edema or consolidation. No pneumothorax. Heart borderline enlarged.   Ct Head Wo Contrast 03/30/2014  1. No acute intracranial pathology.  2. Generalized cerebral atrophy and chronic microvascular disease.   Mr Brain Wo Contrast 03/30/2014  1. Several tiny acute cortical infarcts in the superior left  frontal gyrus, left MCA and left ACA territories. No mass effect or hemorrhage.  2. Scattered chronic micro hemorrhages in the brain compatible with chronic small vessel disease.   2D Echocardiogram EF 60-65% with no source of embolus.   TEE - normal LV function; negative saline microcavitation study  Loop recorder - pAfib found in 12/2014  Component     Latest Ref Rng 03/31/2014  Cholesterol     0 - 200 mg/dL 147  Triglycerides     <150 mg/dL 165 (H)  HDL     >39 mg/dL 32 (L)  Total CHOL/HDL Ratio      4.6  VLDL     0 - 40 mg/dL 33  LDL (calc)     0 - 99 mg/dL 82  Hemoglobin A1C     <5.7 % 6.1 (H)  Mean Plasma Glucose     <117 mg/dL 128 (H)    Assessment: As you may recall, he is a 82 y.o. Caucasian male with PMH of HTN, HLD, CAD with CABG in 2000 was admitted on 03/30/14 for punctate left MCA and ACA territory infarcts, embolic pattern arrhythmia vs. Large vessel asthro. CTA also showed moderate to severe bilateral ICA siphon stenosis with calcified plaques as well as diffuse athero of intrancranial vessels. TTE and TEE unremarkable. His symptoms resolved and he was discharged with ASA and pravastatin. He has avastin injection for macular degeneration. He also enrolled into a study  and had loop recorder placed at office setting as an outpt. He is not a RESPECT ESUS candidate due to severe stenosis of intracranial vessels, avastin injection into eyes, as well as he has enrolled to another study with loop recorder. He was put on dural antiplatelet. BP is under control. Loop recorder found afib in 12/2014 and he was put on Xarelto 15mg  due to CrCl 40s. Still waiting for eliquis from [place] to switch. Will give eliquis samples and discount card today.  Plan:  - switch from Xarelto once a day to eliquis 5mg  twice a day for stroke prevention.  - continue lipitor for stroke prevention - continue zetia and lipitor for HLD - Follow up with your primary care physician for stroke risk factor  modification. Recommend maintain blood pressure goal around 130/80, diabetes with hemoglobin A1c goal below 6.5% and lipids with LDL cholesterol goal below 70 mg/dL.  - check BP at home. - follow up in 6 months  I spent more than 25 minutes of face to face time with the patient. Greater than 50% of time was spent in counseling and coordination of care. We have discussed about how to switch from Xarelto to Eliquis and continue BP monitoring.    No orders of the defined types were placed in this encounter.    Meds ordered this encounter  Medications  . Rivaroxaban (XARELTO) 15 MG TABS tablet    Sig: Take 15 mg by mouth 2 (two) times daily with a meal.  . Multiple Vitamins-Minerals (CENTRUM SILVER PO)    Sig: Take by mouth.    Patient Instructions  - switch from Xarelto once a day to eliquis 5mg  twice a day for stroke prevention.  - call your pharmacy and find out the eliquis status as well as pushing [facility] - continue lipitor for stroke  - continue zetia for HLD - Follow up with your primary care physician for stroke risk factor modification. Recommend maintain blood pressure goal around 130/80, diabetes with hemoglobin A1c goal below 6.5% and lipids with LDL cholesterol goal below 70 mg/dL.  - check BP at home. - follow up in 6 months    Rosalin Hawking, MD PhD [facility] [address], [place],  [address] [phone]

## 2015-05-12 ENCOUNTER — Ambulatory Visit (INDEPENDENT_AMBULATORY_CARE_PROVIDER_SITE_OTHER): Payer: Medicare Other | Admitting: *Deleted

## 2015-05-12 DIAGNOSIS — I639 Cerebral infarction, unspecified: Secondary | ICD-10-CM | POA: Diagnosis not present

## 2015-05-12 NOTE — Progress Notes (Signed)
Carelink Summary Report / Loop Recorder 

## 2015-05-13 LAB — CUP PACEART REMOTE DEVICE CHECK: MDC IDC SESS DTM: 20161129213613

## 2015-05-15 DIAGNOSIS — E7800 Pure hypercholesterolemia, unspecified: Secondary | ICD-10-CM | POA: Diagnosis not present

## 2015-05-15 DIAGNOSIS — R972 Elevated prostate specific antigen [PSA]: Secondary | ICD-10-CM | POA: Diagnosis not present

## 2015-05-15 DIAGNOSIS — I10 Essential (primary) hypertension: Secondary | ICD-10-CM | POA: Diagnosis not present

## 2015-05-17 LAB — CUP PACEART REMOTE DEVICE CHECK: MDC IDC SESS DTM: 20161229220800

## 2015-05-22 DIAGNOSIS — Z0000 Encounter for general adult medical examination without abnormal findings: Secondary | ICD-10-CM | POA: Diagnosis not present

## 2015-05-22 DIAGNOSIS — H6123 Impacted cerumen, bilateral: Secondary | ICD-10-CM | POA: Diagnosis not present

## 2015-05-22 DIAGNOSIS — E785 Hyperlipidemia, unspecified: Secondary | ICD-10-CM | POA: Diagnosis not present

## 2015-05-22 DIAGNOSIS — E119 Type 2 diabetes mellitus without complications: Secondary | ICD-10-CM | POA: Diagnosis not present

## 2015-05-22 DIAGNOSIS — I10 Essential (primary) hypertension: Secondary | ICD-10-CM | POA: Diagnosis not present

## 2015-05-23 ENCOUNTER — Encounter: Payer: Self-pay | Admitting: Interventional Cardiology

## 2015-05-28 DIAGNOSIS — L111 Transient acantholytic dermatosis [Grover]: Secondary | ICD-10-CM | POA: Diagnosis not present

## 2015-05-28 DIAGNOSIS — I8312 Varicose veins of left lower extremity with inflammation: Secondary | ICD-10-CM | POA: Diagnosis not present

## 2015-05-28 DIAGNOSIS — I8311 Varicose veins of right lower extremity with inflammation: Secondary | ICD-10-CM | POA: Diagnosis not present

## 2015-05-28 DIAGNOSIS — L309 Dermatitis, unspecified: Secondary | ICD-10-CM | POA: Diagnosis not present

## 2015-05-28 DIAGNOSIS — I872 Venous insufficiency (chronic) (peripheral): Secondary | ICD-10-CM | POA: Diagnosis not present

## 2015-05-28 DIAGNOSIS — Z85828 Personal history of other malignant neoplasm of skin: Secondary | ICD-10-CM | POA: Diagnosis not present

## 2015-06-03 DIAGNOSIS — Z0000 Encounter for general adult medical examination without abnormal findings: Secondary | ICD-10-CM | POA: Diagnosis not present

## 2015-06-03 DIAGNOSIS — N401 Enlarged prostate with lower urinary tract symptoms: Secondary | ICD-10-CM | POA: Diagnosis not present

## 2015-06-03 DIAGNOSIS — R3916 Straining to void: Secondary | ICD-10-CM | POA: Diagnosis not present

## 2015-06-03 DIAGNOSIS — R972 Elevated prostate specific antigen [PSA]: Secondary | ICD-10-CM | POA: Diagnosis not present

## 2015-06-03 DIAGNOSIS — N402 Nodular prostate without lower urinary tract symptoms: Secondary | ICD-10-CM | POA: Diagnosis not present

## 2015-06-09 ENCOUNTER — Ambulatory Visit (INDEPENDENT_AMBULATORY_CARE_PROVIDER_SITE_OTHER): Payer: Medicare Other | Admitting: *Deleted

## 2015-06-09 DIAGNOSIS — I639 Cerebral infarction, unspecified: Secondary | ICD-10-CM

## 2015-06-10 NOTE — Progress Notes (Signed)
Carelink Summary Report / Loop Recorder 

## 2015-06-17 LAB — CUP PACEART REMOTE DEVICE CHECK: Date Time Interrogation Session: 20170128220644

## 2015-06-17 NOTE — Progress Notes (Signed)
Carelink summary report received. Battery status OK. Normal device function. No new symptom episodes, tachy episodes, brady, or pause episodes. No new AF episodes. Monthly summary reports and ROV/PRN 

## 2015-06-23 LAB — CUP PACEART REMOTE DEVICE CHECK: Date Time Interrogation Session: 20170227223719

## 2015-06-23 NOTE — Progress Notes (Signed)
Carelink summary report received. Battery status OK. Normal device function. No new symptom episodes, tachy episodes, brady, or pause episodes. No new AF episodes. Monthly summary reports and ROV/PRN 

## 2015-07-09 ENCOUNTER — Ambulatory Visit (INDEPENDENT_AMBULATORY_CARE_PROVIDER_SITE_OTHER): Payer: Medicare Other | Admitting: *Deleted

## 2015-07-09 DIAGNOSIS — I639 Cerebral infarction, unspecified: Secondary | ICD-10-CM

## 2015-07-10 NOTE — Progress Notes (Signed)
Carelink Summary Report / Loop Recorder 

## 2015-07-31 DIAGNOSIS — H353112 Nonexudative age-related macular degeneration, right eye, intermediate dry stage: Secondary | ICD-10-CM | POA: Diagnosis not present

## 2015-07-31 DIAGNOSIS — H43813 Vitreous degeneration, bilateral: Secondary | ICD-10-CM | POA: Diagnosis not present

## 2015-07-31 DIAGNOSIS — H353221 Exudative age-related macular degeneration, left eye, with active choroidal neovascularization: Secondary | ICD-10-CM | POA: Diagnosis not present

## 2015-08-08 ENCOUNTER — Ambulatory Visit (INDEPENDENT_AMBULATORY_CARE_PROVIDER_SITE_OTHER): Payer: Medicare Other | Admitting: *Deleted

## 2015-08-08 DIAGNOSIS — I639 Cerebral infarction, unspecified: Secondary | ICD-10-CM | POA: Diagnosis not present

## 2015-08-11 ENCOUNTER — Ambulatory Visit (INDEPENDENT_AMBULATORY_CARE_PROVIDER_SITE_OTHER): Payer: Medicare Other | Admitting: Internal Medicine

## 2015-08-11 ENCOUNTER — Encounter: Payer: Self-pay | Admitting: Internal Medicine

## 2015-08-11 VITALS — BP 162/54 | HR 47 | Ht 71.0 in | Wt 183.0 lb

## 2015-08-11 DIAGNOSIS — I480 Paroxysmal atrial fibrillation: Secondary | ICD-10-CM | POA: Diagnosis not present

## 2015-08-11 DIAGNOSIS — I63412 Cerebral infarction due to embolism of left middle cerebral artery: Secondary | ICD-10-CM

## 2015-08-11 DIAGNOSIS — I4891 Unspecified atrial fibrillation: Secondary | ICD-10-CM | POA: Diagnosis not present

## 2015-08-11 LAB — CBC WITH DIFFERENTIAL/PLATELET
BASOS PCT: 0 %
Basophils Absolute: 0 cells/uL (ref 0–200)
EOS ABS: 201 {cells}/uL (ref 15–500)
Eosinophils Relative: 3 %
HCT: 35.7 % — ABNORMAL LOW (ref 38.5–50.0)
Hemoglobin: 11.7 g/dL — ABNORMAL LOW (ref 13.2–17.1)
Lymphocytes Relative: 22 %
Lymphs Abs: 1474 cells/uL (ref 850–3900)
MCH: 29 pg (ref 27.0–33.0)
MCHC: 32.8 g/dL (ref 32.0–36.0)
MCV: 88.6 fL (ref 80.0–100.0)
MONO ABS: 804 {cells}/uL (ref 200–950)
MONOS PCT: 12 %
MPV: 10 fL (ref 7.5–12.5)
NEUTROS ABS: 4221 {cells}/uL (ref 1500–7800)
Neutrophils Relative %: 63 %
PLATELETS: 260 10*3/uL (ref 140–400)
RBC: 4.03 MIL/uL — AB (ref 4.20–5.80)
RDW: 14.7 % (ref 11.0–15.0)
WBC: 6.7 10*3/uL (ref 3.8–10.8)

## 2015-08-11 LAB — CUP PACEART INCLINIC DEVICE CHECK: Date Time Interrogation Session: 20170501162056

## 2015-08-11 LAB — BASIC METABOLIC PANEL
BUN: 33 mg/dL — ABNORMAL HIGH (ref 7–25)
CHLORIDE: 108 mmol/L (ref 98–110)
CO2: 27 mmol/L (ref 20–31)
Calcium: 8.7 mg/dL (ref 8.6–10.3)
Creat: 1.37 mg/dL — ABNORMAL HIGH (ref 0.70–1.11)
Glucose, Bld: 82 mg/dL (ref 65–99)
POTASSIUM: 4.8 mmol/L (ref 3.5–5.3)
SODIUM: 144 mmol/L (ref 135–146)

## 2015-08-11 NOTE — Progress Notes (Signed)
Carelink Summary Report / Loop Recorder 

## 2015-08-11 NOTE — Progress Notes (Signed)
PCP: Jani Gravel, MD Primary Cardiologist:  Dr Silverio Lay is a 82 y.o. male who presents today for electrophysiology followup.  Since last being seen in our clinic, the patient reports doing very well.  He has been switched from xarelto to eliquis by the [place] due to cost concerns.  Today, he denies symptoms of palpitations, chest pain, shortness of breath,  lower extremity edema, dizziness, presyncope, or syncope.  The patient is otherwise without complaint today.   Past Medical History  Diagnosis Date  . Right bundle branch block 12/20/2013  . Hyperlipidemia 12/20/2013  . Essential hypertension 12/20/2013  . Coronary atherosclerosis of artery bypass graft 12/20/2013    CASHD with CABG 2000 with LIMA to LAD, SV graft to diagonal, SVG to OM, and saphenous vein graft to distal RCA.     Marland Kitchen GERD (gastroesophageal reflux disease)   . Stroke ([place])   . BPH (benign prostatic hyperplasia)   . Paroxysmal atrial fibrillation [facility])    Past Surgical History  Procedure Laterality Date  . Tee without cardioversion N/A 04/02/2014    Procedure: TRANSESOPHAGEAL ECHOCARDIOGRAM (TEE);  Surgeon: Lelon Perla, MD;  Location: [facility] ENDOSCOPY;  Service: Cardiovascular;  Laterality: N/A;  . Prostate surgery  1985  . Hernia repair  2005  . Cardiac surgery  2000    BYPASS  . Cardiac surgery      quadruple by pass surgery    ROS- all systems are reviewed and negatives except as per HPI above  Current Outpatient Prescriptions  Medication Sig Dispense Refill  . amLODipine (NORVASC) 5 MG tablet Take 1 tablet (5 mg total) by mouth daily.    Marland Kitchen apixaban (ELIQUIS) 5 MG TABS tablet Take 1 tablet (5 mg total) by mouth 2 (two) times daily. 60 tablet 6  . atenolol (TENORMIN) 25 MG tablet Take 25 mg by mouth at bedtime.    Marland Kitchen atorvastatin (LIPITOR) 40 MG tablet Take 1 tablet (40 mg total) by mouth daily. 90 tablet 3  . ezetimibe (ZETIA) 10 MG tablet Take 5 mg by mouth at bedtime.    . finasteride (PROSCAR) 5 MG  tablet Take 0.5 tablets (2.5 mg total) by mouth daily.    . hydrochlorothiazide (HYDRODIURIL) 25 MG tablet Take 12.5 mg by mouth every other day.    . lisinopril (PRINIVIL,ZESTRIL) 40 MG tablet Take 1 tablet (40 mg total) by mouth daily.    . metFORMIN (GLUCOPHAGE) 500 MG tablet Take 0.5 tablets (250 mg total) by mouth daily with breakfast.    . Multiple Vitamins-Minerals (CENTRUM SILVER PO) Take by mouth.    . Multiple Vitamins-Minerals (PRESERVISION AREDS 2 PO) Take 1 capsule by mouth 2 (two) times daily.     . pantoprazole (PROTONIX) 40 MG tablet Take 40 mg by mouth daily.    . traMADol (ULTRAM) 50 MG tablet Take 50 mg by mouth every 6 (six) hours as needed. (PAIN)  0   No current facility-administered medications for this visit.    Physical Exam: Filed Vitals:   08/11/15 1516  BP: 162/54  Pulse: 47  Height: 5\' 11"  (1.803 m)  Weight: 183 lb (83.008 kg)    GEN- The patient is well appearing, alert and oriented x 3 today.   Head- normocephalic, atraumatic Eyes-  Sclera clear, conjunctiva pink Ears- hearing intact Oropharynx- clear Lungs- Clear to ausculation bilaterally, normal work of breathing Heart- Regular rate and rhythm, no murmurs, rubs or gallops, PMI not laterally displaced GI- soft, NT, ND, + BS Extremities-  no clubbing, cyanosis, or edema  ILR is reviewed  Assessment and Plan:  1. Paroxysmal atrial fibrillation Doing well with eliquis Low af burden Will check cbc and bmet today  ILR reviewed and reveals predominantly sinus rhythm Will continue to follow with [facility]  Return to see me as needed Follow-up with Dr Tamala Julian as scheduled  Thompson Grayer MD, [facility] 08/11/2015 4:50 PM

## 2015-08-11 NOTE — Patient Instructions (Addendum)
Medication Instructions:  Your physician recommends that you continue on your current medications as directed. Please refer to the Current Medication list given to you today.   Labwork: Your physician recommends that you return for lab work today: CBC/BMP    Testing/Procedures: None ordered   Follow-Up: Your physician recommends that you schedule a follow-up appointment as needed    Any Other Special Instructions Will Be Listed Below (If Applicable).     If you need a refill on your cardiac medications before your next appointment, please call your pharmacy.

## 2015-08-12 ENCOUNTER — Telehealth: Payer: Self-pay | Admitting: *Deleted

## 2015-08-12 NOTE — Telephone Encounter (Signed)
Saw pt's wife today and pt's husband left paper work stating Alma Friendly, [place], will [facility]'s [facility]. I will put in Dr. Tamala Julian mailbox

## 2015-09-02 DIAGNOSIS — H353221 Exudative age-related macular degeneration, left eye, with active choroidal neovascularization: Secondary | ICD-10-CM | POA: Diagnosis not present

## 2015-09-06 LAB — CUP PACEART REMOTE DEVICE CHECK: MDC IDC SESS DTM: 20170329230647

## 2015-09-06 NOTE — Progress Notes (Signed)
Carelink summary report received. Battery status OK. Normal device function. No new symptom episodes, tachy episodes, brady, or pause episodes. No new AF episodes. Monthly summary reports and ROV/PRN 

## 2015-09-09 ENCOUNTER — Ambulatory Visit (INDEPENDENT_AMBULATORY_CARE_PROVIDER_SITE_OTHER): Payer: Medicare Other | Admitting: *Deleted

## 2015-09-09 DIAGNOSIS — I639 Cerebral infarction, unspecified: Secondary | ICD-10-CM | POA: Diagnosis not present

## 2015-09-10 NOTE — Progress Notes (Signed)
Carelink Summary Report / Loop Recorder 

## 2015-09-19 LAB — CUP PACEART REMOTE DEVICE CHECK: MDC IDC SESS DTM: 20170428233712

## 2015-09-29 DIAGNOSIS — H353221 Exudative age-related macular degeneration, left eye, with active choroidal neovascularization: Secondary | ICD-10-CM | POA: Diagnosis not present

## 2015-10-07 ENCOUNTER — Ambulatory Visit (INDEPENDENT_AMBULATORY_CARE_PROVIDER_SITE_OTHER): Payer: Medicare Other | Admitting: *Deleted

## 2015-10-07 DIAGNOSIS — I639 Cerebral infarction, unspecified: Secondary | ICD-10-CM | POA: Diagnosis not present

## 2015-10-08 NOTE — Progress Notes (Signed)
Carelink Summary Report / Loop Recorder 

## 2015-10-17 LAB — CUP PACEART REMOTE DEVICE CHECK: MDC IDC SESS DTM: 20170529000651

## 2015-10-21 ENCOUNTER — Ambulatory Visit (INDEPENDENT_AMBULATORY_CARE_PROVIDER_SITE_OTHER): Payer: Medicare Other | Admitting: Neurology

## 2015-10-21 ENCOUNTER — Encounter: Payer: Self-pay | Admitting: Neurology

## 2015-10-21 VITALS — BP 126/54 | HR 51 | Ht 71.0 in | Wt 178.0 lb

## 2015-10-21 DIAGNOSIS — Z7901 Long term (current) use of anticoagulants: Secondary | ICD-10-CM | POA: Diagnosis not present

## 2015-10-21 DIAGNOSIS — I10 Essential (primary) hypertension: Secondary | ICD-10-CM

## 2015-10-21 DIAGNOSIS — E785 Hyperlipidemia, unspecified: Secondary | ICD-10-CM | POA: Diagnosis not present

## 2015-10-21 DIAGNOSIS — I480 Paroxysmal atrial fibrillation: Secondary | ICD-10-CM | POA: Diagnosis not present

## 2015-10-21 DIAGNOSIS — I679 Cerebrovascular disease, unspecified: Secondary | ICD-10-CM

## 2015-10-21 DIAGNOSIS — I63412 Cerebral infarction due to embolism of left middle cerebral artery: Secondary | ICD-10-CM

## 2015-10-21 NOTE — Progress Notes (Signed)
STROKE NEUROLOGY FOLLOW UP NOTE  NAME: [name] DOB: 10-15-1922  REASON FOR VISIT: stroke follow up HISTORY FROM: pt and chart  Today we had the pleasure of seeing [name] in follow-up at our Neurology Clinic. Pt was accompanied by no one.   History Summary [name] is a 82 y.o. male with history of hypertension, hyperlipidemia, coronary artery disease with coronary artery bypass graft surgery in 2000 was admitted on 03/30/14 with generalized weakness after falling. [name] did not lose consciousness. No speech abnormalities were noted. [name] finally got some help to get to the bed and EMS was called.MRI showed punctate left MCA and ACA territory infarcts, embolic pattern. CTA also showed moderate to severe bilateral ICA siphon stenosis with calcified plaques as well as diffuse athero of intrancranial vessels. TTE and TEE unremarkable. His symptoms resolved and [name] was discharged with ASA and pravastatin.   06/17/14 follow up - the patient has been doing well. His PCP changed him from ASA to plavix. No recurrent strokes. His Bp in clinic today 156/60. [name] stated that at home his BP normally 130/60. [name] has avastin injection for macular degeneration. [name] also enrolled into a study and had loop recorder placed at office setting as an outpt. So far no afib episodes found.   10/22/14 follow up - the patient has been doing well. No recurrent stroke like symptoms. Finished off avastin injection for macular degeneration and vision is better. Loop recorder showed no afib so far. Currently on ASA and plavix. BP today 139/54. However, [name] stated that around mid morning, [name] feels some dizziness but at lunch time, [name] is OK. [name] is taking 4 BP meds all in the morning, which could be explanation.  04/24/15 follow up - the patient has been doing well. No recurrent stroke like symptoms. [name] was found to have pAfib on loop recorder in 12/2014. [name] was put on Xarelto and stopped ASA and plavix. However, [facility] not [facility] but OK to cover for eliquis. [name] is currently still on Xarelto 15mg  daily while waiting for eliquis to be shipped out from [place]. BP stable 131/54. No other complains.  Interval History During the interval time, pt has been doing well. No stroke like symptoms. On eliquis now from [place]. Follows with cardiology Dr. Tamala Julian and Dr. Rayann Heman. Did not check BP at home, and today 126/54. No other complains.   REVIEW OF SYSTEMS: Full 14 system review of systems performed and notable only for those listed below and in HPI above, all others are negative:  Constitutional:   Cardiovascular:  Ear/Nose/Throat:   Skin:  Eyes:  Blurry vision Respiratory:   Gastroitestinal:   Genitourinary:  Hematology/Lymphatic:  Bleeding easily Endocrine:  Musculoskeletal:   Allergy/Immunology:   Neurological:   Psychiatric:  Sleep:   The following represents the patient's updated allergies and side effects list: Allergies  Allergen Reactions  . Monosodium Glutamate Anaphylaxis    The neurologically relevant items on the patient's problem list were reviewed on today's visit.  Neurologic Examination  A problem focused neurological exam (12 or more points of the single system neurologic examination, vital signs counts as 1 point, cranial nerves count for 8 points) was performed.  Blood pressure 126/54, pulse 51, height 5\' 11"  (1.803 m), weight 178 lb (80.74 kg).  General - Well nourished, well developed, in no apparent distress.  Ophthalmologic - fundi not visualized due to small pupils.  Cardiovascular - Regular rate and rhythm with  no murmur.  Mental Status -  Level of arousal and orientation to time, place, and person were intact. Language including expression, naming, repetition, comprehension was assessed and found intact.  Cranial Nerves II - XII - II - Visual field intact OU III, IV, VI - Extraocular movements intact. V - Facial sensation intact bilaterally. VII - Facial  movement intact bilaterally. VIII - Hearing & vestibular intact bilaterally. X - Palate elevates symmetrically. XI - Chin turning & shoulder shrug intact bilaterally. XII - Tongue protrusion intact.  Motor Strength - The patient's strength was normal in all extremities and pronator drift was absent.  Bulk was normal and fasciculations were absent.   Motor Tone - Muscle tone was assessed at the neck and appendages and was normal.  Reflexes - The patient's reflexes were normal in all extremities and [name] had no pathological reflexes.  Sensory - Light touch, temperature/pinprick, vibration and proprioception, and Romberg testing were assessed and were normal.    Coordination - The patient had normal movements in the hands and feet with no ataxia or dysmetria.  Tremor was absent.  Gait and Station - mildly slow but steady without tendency to fall.  Data reviewed: I personally reviewed the images and agree with the radiology interpretations.  Ct Angio Head and Neck W/cm &/or Wo Cm 03/30/2014  1. Extracranial carotid arteries with no significant atherosclerotic stenosis, but tortuosity and a kinked appearance more so on the right.  2. Moderate to severe bilateral ICA siphon atherosclerotic stenosis related to bulky calcified plaque. High-grade stenosis of the right ICA anterior genu.  3. Otherwise mild anterior circulation atherosclerosis, mild right MCA M1 stenosis.  4. Overall moderate bilateral vertebral artery atherosclerosis in stenosis; moderate distal right vertebral artery and left vertebral artery origin stenoses.  5. Otherwise posterior circulation remarkable for mild to moderate left PCA atherosclerosis and stenosis.  6. Stable and negative CT appearance of the brain.   Chest 2 View 03/30/2014  Underlying emphysematous change. No edema or consolidation. No pneumothorax. Heart borderline enlarged.   Ct Head Wo Contrast 03/30/2014  1. No acute intracranial  pathology.  2. Generalized cerebral atrophy and chronic microvascular disease.   Mr Brain Wo Contrast 03/30/2014  1. Several tiny acute cortical infarcts in the superior left frontal gyrus, left MCA and left ACA territories. No mass effect or hemorrhage.  2. Scattered chronic micro hemorrhages in the brain compatible with chronic small vessel disease.   2D Echocardiogram EF 60-65% with no source of embolus.   TEE - normal LV function; negative saline microcavitation study  Loop recorder - pAfib found in 12/2014  Component     Latest Ref Rng 03/31/2014  Cholesterol     0 - 200 mg/dL 147  Triglycerides     <150 mg/dL 165 (H)  HDL     >39 mg/dL 32 (L)  Total CHOL/HDL Ratio      4.6  VLDL     0 - 40 mg/dL 33  LDL (calc)     0 - 99 mg/dL 82  Hemoglobin A1C     <5.7 % 6.1 (H)  Mean Plasma Glucose     <117 mg/dL 128 (H)    Assessment: As you may recall, [name] is a 82 y.o. Caucasian male with PMH of HTN, HLD, CAD with CABG in 2000 was admitted on 03/30/14 for punctate left MCA and ACA territory infarcts, embolic pattern arrhythmia vs. Large vessel asthro. CTA also showed moderate to severe bilateral ICA siphon stenosis with calcified  plaques as well as diffuse athero of intrancranial vessels. TTE and TEE unremarkable. His symptoms resolved and [name] was discharged with ASA and pravastatin. [name] has avastin injection for macular degeneration. [name] also enrolled into a study and had loop recorder placed at office setting as an outpt. [name] is not a RESPECT ESUS candidate due to severe stenosis of intracranial vessels, avastin injection into eyes, as well as [name] has enrolled to another study with loop recorder. [name] was put on dural antiplatelet. BP is under control. Loop recorder found afib in 12/2014 and [name] was put on Xarelto 15mg  due to CrCl 40s. Currently, changed from Xarelto to Eliquis 5mg  bid. Pt has been doing well.   Plan:   - continue eliquis and lipitor for stroke prevention - continue  zetia and lipitor for HLD - Follow up with your primary care physician for stroke risk factor modification. Recommend maintain blood pressure goal around 130-150/80, diabetes with hemoglobin A1c goal below 6.5% and lipids with LDL cholesterol goal below 70 mg/dL.  - check BP at home and BP goal 130-150 due to intracranial vascular stenosis - avoid fall  - follow up in one year   No orders of the defined types were placed in this encounter.    No orders of the defined types were placed in this encounter.    Patient Instructions   - continue eliquis and lipitor for stroke prevention - continue zetia and lipitor for HLD - Follow up with your primary care physician for stroke risk factor modification. Recommend maintain blood pressure goal around 130-150/80, diabetes with hemoglobin A1c goal below 6.5% and lipids with LDL cholesterol goal below 70 mg/dL.  - check BP at home and BP goal 130-150 due to intracranial vascular stenosis - avoid fall  - follow up in one year    Rosalin Hawking, MD PhD [facility] [address], [place], [place] [address] [phone]

## 2015-10-21 NOTE — Patient Instructions (Signed)
-   continue eliquis and lipitor for stroke prevention - continue zetia and lipitor for HLD - Follow up with your primary care physician for stroke risk factor modification. Recommend maintain blood pressure goal around 130-150/80, diabetes with hemoglobin A1c goal below 6.5% and lipids with LDL cholesterol goal below 70 mg/dL.  - check BP at home and BP goal 130-150 due to intracranial vascular stenosis - avoid fall  - follow up in one year

## 2015-10-24 LAB — CUP PACEART REMOTE DEVICE CHECK: MDC IDC SESS DTM: 20170628000840

## 2015-11-03 DIAGNOSIS — H353221 Exudative age-related macular degeneration, left eye, with active choroidal neovascularization: Secondary | ICD-10-CM | POA: Diagnosis not present

## 2015-11-06 ENCOUNTER — Ambulatory Visit (INDEPENDENT_AMBULATORY_CARE_PROVIDER_SITE_OTHER): Payer: Medicare Other | Admitting: *Deleted

## 2015-11-06 DIAGNOSIS — I639 Cerebral infarction, unspecified: Secondary | ICD-10-CM | POA: Diagnosis not present

## 2015-11-07 NOTE — Progress Notes (Signed)
Carelink Summary Report / Loop Recorder 

## 2015-11-10 ENCOUNTER — Ambulatory Visit (INDEPENDENT_AMBULATORY_CARE_PROVIDER_SITE_OTHER): Payer: Medicare Other | Admitting: Interventional Cardiology

## 2015-11-10 ENCOUNTER — Encounter: Payer: Self-pay | Admitting: Interventional Cardiology

## 2015-11-10 VITALS — BP 132/70 | HR 50 | Ht 71.0 in | Wt 179.0 lb

## 2015-11-10 DIAGNOSIS — I480 Paroxysmal atrial fibrillation: Secondary | ICD-10-CM | POA: Diagnosis not present

## 2015-11-10 DIAGNOSIS — Z7901 Long term (current) use of anticoagulants: Secondary | ICD-10-CM

## 2015-11-10 DIAGNOSIS — I25701 Atherosclerosis of coronary artery bypass graft(s), unspecified, with angina pectoris with documented spasm: Secondary | ICD-10-CM | POA: Diagnosis not present

## 2015-11-10 DIAGNOSIS — I4510 Unspecified right bundle-branch block: Secondary | ICD-10-CM

## 2015-11-10 DIAGNOSIS — I63412 Cerebral infarction due to embolism of left middle cerebral artery: Secondary | ICD-10-CM

## 2015-11-10 DIAGNOSIS — I10 Essential (primary) hypertension: Secondary | ICD-10-CM

## 2015-11-10 NOTE — Patient Instructions (Signed)

## 2015-11-10 NOTE — Progress Notes (Signed)
Cardiology Office Note    Date:  11/10/2015   ID:  [name], DOB 03-Oct-1922, MRN [id number]  PCP:  Jani Gravel, MD  Cardiologist: Sinclair Grooms, MD   Chief Complaint  Patient presents with  . Coronary Artery Disease    History of Present Illness:  [name] is a 82 y.o. male follow-up of coronary artery disease with bypass grafting in 2000, essential hypertension, hyperlipidemia, paroxysmal atrial fibrillation, and known right bundle branch block. 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  There are no particular cardiac complaints at this time. He has not fallen. He denies head trauma. No palpitations. No recurrence of neurological symptoms. He denies chest discomfort.  Past Medical History:  Diagnosis Date  . BPH (benign prostatic hyperplasia)   . Coronary atherosclerosis of artery bypass graft 12/20/2013   CASHD with CABG 2000 with LIMA to LAD, SV graft to diagonal, SVG to OM, and saphenous vein graft to distal RCA.     . Essential hypertension 12/20/2013  . GERD (gastroesophageal reflux disease)   . Hyperlipidemia 12/20/2013  . Paroxysmal atrial fibrillation ([facility])   . Right bundle branch block 12/20/2013  . Stroke [facility])     Past Surgical History:  Procedure Laterality Date  . CARDIAC SURGERY  2000   BYPASS  . CARDIAC SURGERY     quadruple by pass surgery  . HERNIA REPAIR  2005  . LOOP RECORDER IMPLANT    . PROSTATE SURGERY  1985  . TEE WITHOUT CARDIOVERSION N/A 04/02/2014   Procedure: TRANSESOPHAGEAL ECHOCARDIOGRAM (TEE);  Surgeon: Lelon Perla, MD;  Location: [facility] ENDOSCOPY;  Service: Cardiovascular;  Laterality: N/A;    Current Medications: Outpatient Medications Prior to Visit  Medication Sig Dispense Refill  . amLODipine (NORVASC) 5 MG tablet Take 1 tablet (5 mg total) by mouth daily.    Marland Kitchen apixaban (ELIQUIS) 5 MG TABS tablet Take 1 tablet (5 mg total) by mouth 2 (two) times daily. 60 tablet 6  . atenolol (TENORMIN) 25 MG tablet Take 25 mg by mouth  at bedtime. Take 1/2 tablet    . atorvastatin (LIPITOR) 40 MG tablet Take 1 tablet (40 mg total) by mouth daily. 90 tablet 3  . ezetimibe (ZETIA) 10 MG tablet Take 5 mg by mouth at bedtime.    . finasteride (PROSCAR) 5 MG tablet Take 0.5 tablets (2.5 mg total) by mouth daily.    . hydrochlorothiazide (HYDRODIURIL) 25 MG tablet Take 12.5 mg by mouth every other day.    . lisinopril (PRINIVIL,ZESTRIL) 40 MG tablet Take 1 tablet (40 mg total) by mouth daily.    . metFORMIN (GLUCOPHAGE) 500 MG tablet Take 0.5 tablets (250 mg total) by mouth daily with breakfast.    . Multiple Vitamins-Minerals (CENTRUM SILVER PO) Take by mouth.    . Multiple Vitamins-Minerals (PRESERVISION AREDS 2 PO) Take 1 capsule by mouth 2 (two) times daily.     . pantoprazole (PROTONIX) 40 MG tablet Take 40 mg by mouth daily.    . traMADol (ULTRAM) 50 MG tablet Take 50 mg by mouth every 6 (six) hours as needed. (PAIN)  0   No facility-administered medications prior to visit.      Allergies:   Monosodium glutamate   Social History   Social History  . Marital status: Married    Spouse name: N/A  . Number of children: 3  . Years of education: B[employer]   Social History Main Topics  . Smoking status: Never Smoker  . Smokeless  tobacco: Never Used  . Alcohol use 0.6 oz/week    1 Glasses of wine per week     Comment: SOCIAL  . Drug use: No  . Sexual activity: Not Currently   Other Topics Concern  . None   Social History Narrative   Patient is married with 3 children.   Patient is right handed.   Patient has his B[employer] degree.   Patient drinks 2 cups daily.     Family History:  The patient's family history includes Hypertension in his mother.   ROS:   Please see the history of present illness.    Occasional skin rash, postherpetic neuralgia is markedly improved. Vision disturbance. Occasional lower extremity swelling.  All other systems reviewed and are negative.   PHYSICAL EXAM:   VS:  BP 132/70   Pulse (!)  50   Ht 5\' 11"  (1.803 m)   Wt 179 lb (81.2 kg)   BMI 24.97 kg/m    GEN: Well nourished, well developed, in no acute distress  HEENT: normal  Neck: no JVD, carotid bruits, or masses Cardiac: RRR; no murmurs, rubs, or gallops,no edema  Respiratory:  clear to auscultation bilaterally, normal work of breathing GI: soft, nontender, nondistended, + BS MS: no deformity or atrophy  Skin: warm and dry, no rash Neuro:  Alert and Oriented x 3, Strength and sensation are intact Psych: euthymic mood, full affect  Wt Readings from Last 3 Encounters:  11/10/15 179 lb (81.2 kg)  10/21/15 178 lb (80.7 kg)  08/11/15 183 lb (83 kg)      Studies/Labs Reviewed:   EKG:  EKG  08/11/2015 revealed right bundle branch block with sinus bradycardia but normal PR interval.  Recent Labs: 12/05/2014: ALT 16 08/11/2015: BUN 33; Creat 1.37; Hemoglobin 11.7; Platelets 260; Potassium 4.8; Sodium 144   Lipid Panel    Component Value Date/Time   CHOL 81 12/05/2014 0427   TRIG 83 12/05/2014 0427   HDL 29 (L) 12/05/2014 0427   CHOLHDL 2.8 12/05/2014 0427   VLDL 17 12/05/2014 0427   LDLCALC 35 12/05/2014 0427    Additional studies/ records that were reviewed today include:  None    ASSESSMENT:    1. Atherosclerosis of coronary artery bypass graft of native heart with angina pectoris with documented spasm ([place])   2. Essential hypertension   3. Right bundle branch block   4. Paroxysmal atrial fibrillation (HCC)   5. Chronic anticoagulation      PLAN:  In order of problems listed above:  1. Asymptomatic and stable. Continue current therapy which is low-dose beta blocker atorvastatin and amlodipine. 2. Continue current medical regimen which includes atenolol, lisinopril, and hydrochlorothiazide. Limit salt in diet. 3. No change compared to prior 4. No symptomatic recurrences of atrial fibrillation effort. Likely the genesis of the patient's embolic CVA slightly over one year ago.    Medication  Adjustments/Labs and Tests Ordered: Current medicines are reviewed at length with the patient today.  Concerns regarding medicines are outlined above.  Medication changes, Labs and Tests ordered today are listed in the Patient Instructions below. Patient Instructions  Medication Instructions:  Your physician recommends that you continue on your current medications as directed. Please refer to the Current Medication list given to you today.   Labwork: None ordered  Testing/Procedures: None ordered  Follow-Up: Your physician wants you to follow-up in: 1 year with Dr.Smith You will receive a reminder letter in the mail two months in advance. If you don't receive a  letter, please call our office to schedule the follow-up appointment.   Any Other Special Instructions Will Be Listed Below (If Applicable).     If you need a refill on your cardiac medications before your next appointment, please call your pharmacy.      Signed, Sinclair Grooms, MD  11/10/2015 2:40 PM    [facility] [place], [place], [place]  [address] Phone: [phone]; Fax: [phone]

## 2015-11-17 DIAGNOSIS — I10 Essential (primary) hypertension: Secondary | ICD-10-CM | POA: Diagnosis not present

## 2015-11-17 DIAGNOSIS — E119 Type 2 diabetes mellitus without complications: Secondary | ICD-10-CM | POA: Diagnosis not present

## 2015-11-20 DIAGNOSIS — R972 Elevated prostate specific antigen [PSA]: Secondary | ICD-10-CM | POA: Diagnosis not present

## 2015-11-20 DIAGNOSIS — I10 Essential (primary) hypertension: Secondary | ICD-10-CM | POA: Diagnosis not present

## 2015-11-20 DIAGNOSIS — I639 Cerebral infarction, unspecified: Secondary | ICD-10-CM | POA: Diagnosis not present

## 2015-11-20 DIAGNOSIS — E785 Hyperlipidemia, unspecified: Secondary | ICD-10-CM | POA: Diagnosis not present

## 2015-11-27 DIAGNOSIS — H353221 Exudative age-related macular degeneration, left eye, with active choroidal neovascularization: Secondary | ICD-10-CM | POA: Diagnosis not present

## 2015-12-05 LAB — CUP PACEART REMOTE DEVICE CHECK: MDC IDC SESS DTM: 20170728003630

## 2015-12-08 ENCOUNTER — Ambulatory Visit (INDEPENDENT_AMBULATORY_CARE_PROVIDER_SITE_OTHER): Payer: Medicare Other | Admitting: *Deleted

## 2015-12-08 DIAGNOSIS — I639 Cerebral infarction, unspecified: Secondary | ICD-10-CM

## 2015-12-09 NOTE — Progress Notes (Signed)
Carelink Summary Report / Loop Recorder 

## 2015-12-31 DIAGNOSIS — R972 Elevated prostate specific antigen [PSA]: Secondary | ICD-10-CM | POA: Diagnosis not present

## 2015-12-31 DIAGNOSIS — N402 Nodular prostate without lower urinary tract symptoms: Secondary | ICD-10-CM | POA: Diagnosis not present

## 2015-12-31 DIAGNOSIS — R3916 Straining to void: Secondary | ICD-10-CM | POA: Diagnosis not present

## 2015-12-31 DIAGNOSIS — N401 Enlarged prostate with lower urinary tract symptoms: Secondary | ICD-10-CM | POA: Diagnosis not present

## 2016-01-03 LAB — CUP PACEART REMOTE DEVICE CHECK: Date Time Interrogation Session: 20170827011101

## 2016-01-03 NOTE — Progress Notes (Signed)
Carelink summary report received. Battery status OK. Normal device function. No new symptom episodes, tachy episodes, brady, or pause episodes. No new AF episodes. Monthly summary reports and ROV/PRN 

## 2016-01-05 ENCOUNTER — Ambulatory Visit (INDEPENDENT_AMBULATORY_CARE_PROVIDER_SITE_OTHER): Payer: Medicare Other | Admitting: *Deleted

## 2016-01-05 DIAGNOSIS — I639 Cerebral infarction, unspecified: Secondary | ICD-10-CM | POA: Diagnosis not present

## 2016-01-06 NOTE — Progress Notes (Signed)
Carelink Summary Report / Loop Recorder 

## 2016-01-08 DIAGNOSIS — H353221 Exudative age-related macular degeneration, left eye, with active choroidal neovascularization: Secondary | ICD-10-CM | POA: Diagnosis not present

## 2016-01-22 DIAGNOSIS — Z23 Encounter for immunization: Secondary | ICD-10-CM | POA: Diagnosis not present

## 2016-02-02 DIAGNOSIS — L570 Actinic keratosis: Secondary | ICD-10-CM | POA: Diagnosis not present

## 2016-02-02 DIAGNOSIS — D485 Neoplasm of uncertain behavior of skin: Secondary | ICD-10-CM | POA: Diagnosis not present

## 2016-02-02 DIAGNOSIS — Z85828 Personal history of other malignant neoplasm of skin: Secondary | ICD-10-CM | POA: Diagnosis not present

## 2016-02-02 DIAGNOSIS — L821 Other seborrheic keratosis: Secondary | ICD-10-CM | POA: Diagnosis not present

## 2016-02-02 DIAGNOSIS — C4441 Basal cell carcinoma of skin of scalp and neck: Secondary | ICD-10-CM | POA: Diagnosis not present

## 2016-02-02 DIAGNOSIS — D171 Benign lipomatous neoplasm of skin and subcutaneous tissue of trunk: Secondary | ICD-10-CM | POA: Diagnosis not present

## 2016-02-04 ENCOUNTER — Ambulatory Visit (INDEPENDENT_AMBULATORY_CARE_PROVIDER_SITE_OTHER): Payer: Medicare Other | Admitting: *Deleted

## 2016-02-04 DIAGNOSIS — I639 Cerebral infarction, unspecified: Secondary | ICD-10-CM

## 2016-02-05 DIAGNOSIS — H353221 Exudative age-related macular degeneration, left eye, with active choroidal neovascularization: Secondary | ICD-10-CM | POA: Diagnosis not present

## 2016-02-05 DIAGNOSIS — H524 Presbyopia: Secondary | ICD-10-CM | POA: Diagnosis not present

## 2016-02-05 DIAGNOSIS — Z961 Presence of intraocular lens: Secondary | ICD-10-CM | POA: Diagnosis not present

## 2016-02-05 NOTE — Progress Notes (Signed)
Carelink Summary Report / Loop Recorder 

## 2016-02-10 DIAGNOSIS — C4441 Basal cell carcinoma of skin of scalp and neck: Secondary | ICD-10-CM | POA: Diagnosis not present

## 2016-02-10 DIAGNOSIS — Z85828 Personal history of other malignant neoplasm of skin: Secondary | ICD-10-CM | POA: Diagnosis not present

## 2016-02-12 DIAGNOSIS — H353221 Exudative age-related macular degeneration, left eye, with active choroidal neovascularization: Secondary | ICD-10-CM | POA: Diagnosis not present

## 2016-02-19 LAB — CUP PACEART REMOTE DEVICE CHECK
MDC IDC PG IMPLANT DT: 20160104
MDC IDC SESS DTM: 20170926023828

## 2016-02-19 NOTE — Progress Notes (Signed)
Carelink summary report received. Battery status OK. Normal device function. No new symptom episodes, tachy episodes, brady, or pause episodes. No new AF episodes. Monthly summary reports and ROV/PRN 

## 2016-03-06 LAB — CUP PACEART REMOTE DEVICE CHECK
Implantable Pulse Generator Implant Date: 20160104
MDC IDC SESS DTM: 20171026024301

## 2016-03-06 NOTE — Progress Notes (Signed)
Carelink summary report received. Battery status OK. Normal device function. No new symptom episodes, tachy episodes, brady, or pause episodes. No new AF episodes. Monthly summary reports and ROV/PRN 

## 2016-03-08 ENCOUNTER — Ambulatory Visit (INDEPENDENT_AMBULATORY_CARE_PROVIDER_SITE_OTHER): Payer: Medicare Other | Admitting: *Deleted

## 2016-03-08 DIAGNOSIS — I639 Cerebral infarction, unspecified: Secondary | ICD-10-CM

## 2016-03-09 NOTE — Progress Notes (Signed)
Carelink Summary Report / Loop Recorder 

## 2016-03-18 DIAGNOSIS — H353221 Exudative age-related macular degeneration, left eye, with active choroidal neovascularization: Secondary | ICD-10-CM | POA: Diagnosis not present

## 2016-03-19 IMAGING — CT CT HEAD W/O CM
1 series · 15 of 30 positions shown, 19 images · non-contrast
Comparison: None.

CLINICAL DATA: Syncopal episode, history of CABG, history of
hypertension, history of right bundle-branch block

EXAM:
CT HEAD WITHOUT CONTRAST
TECHNIQUE: Contiguous axial images were obtained from the base of the skull
through the vertex without intravenous contrast.

[Series 2: head 5.0 h30s · axial · 0.46mm/px · z∈[-192,-52]mm · 15 of 32 slices shown, 19 images]
[im 2/32  brain]
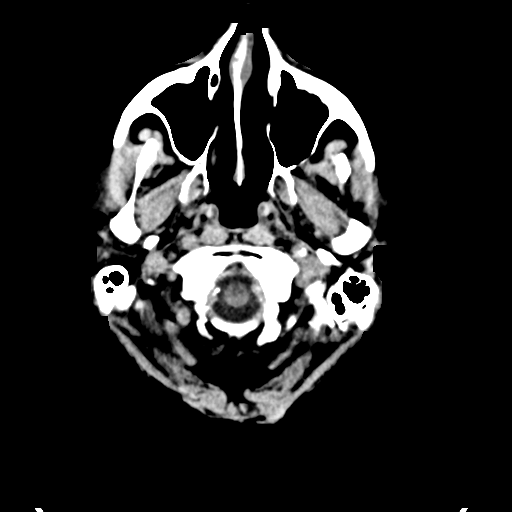
[im 2/32  bone]
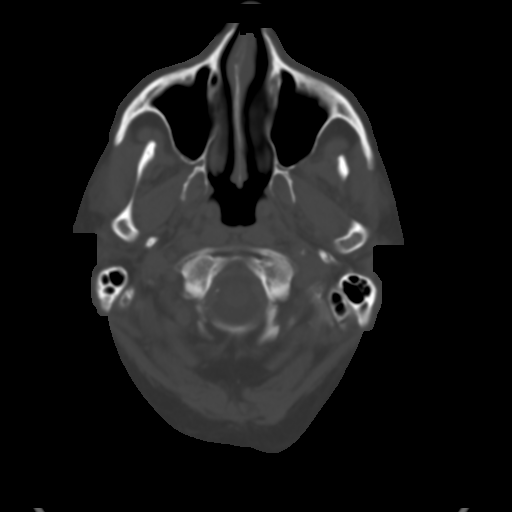
[im 4/32  brain]
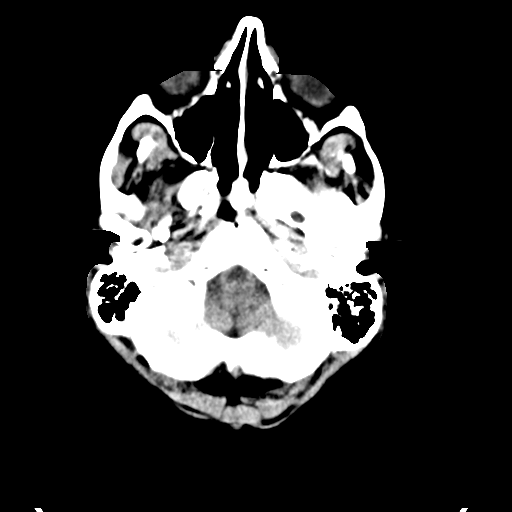
[im 6/32  brain]
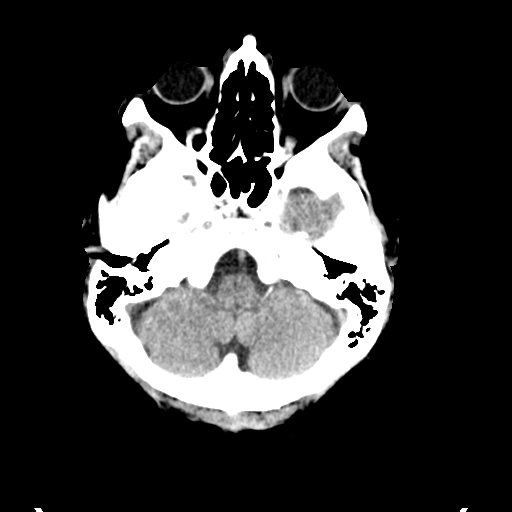
[im 8/32  brain]
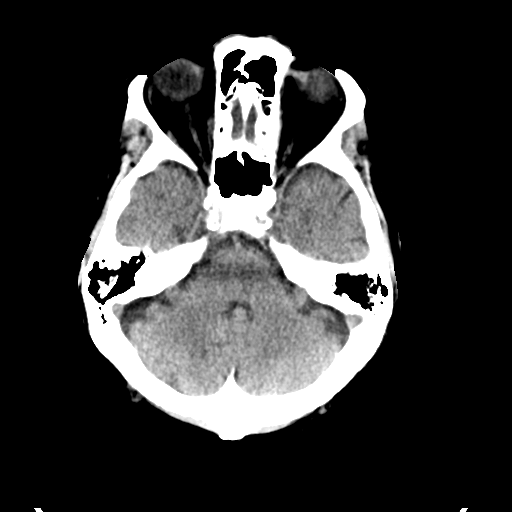
[im 10/32  brain]
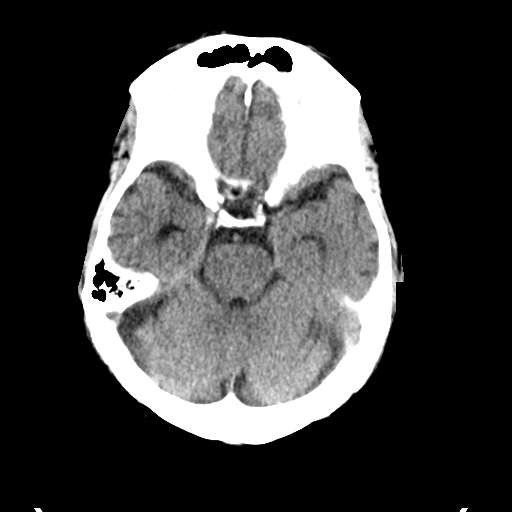
[im 10/32  bone]
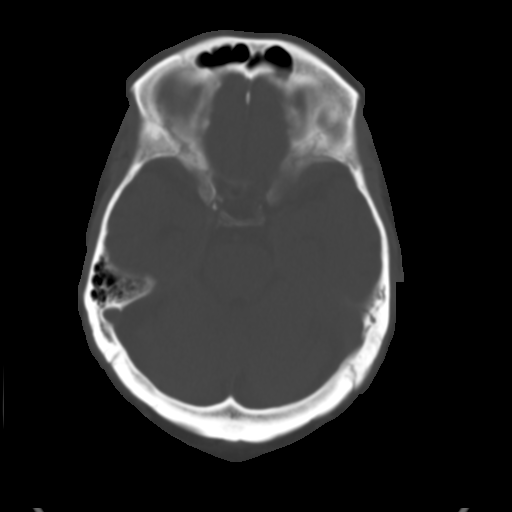
[im 12/32  brain]
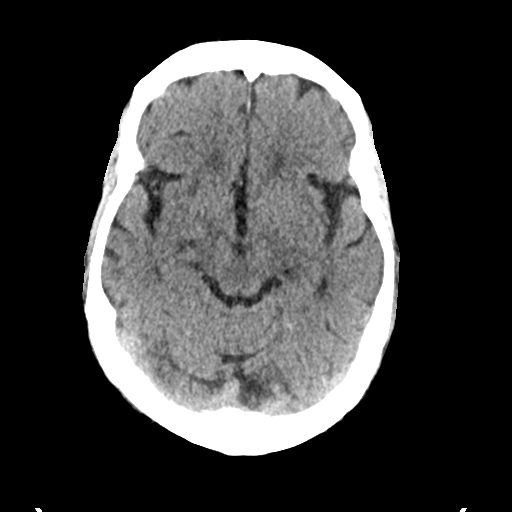
[im 14/32  brain]
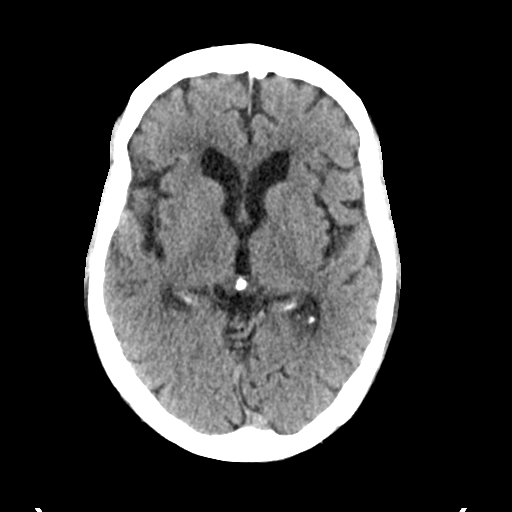
[im 17/32  brain]
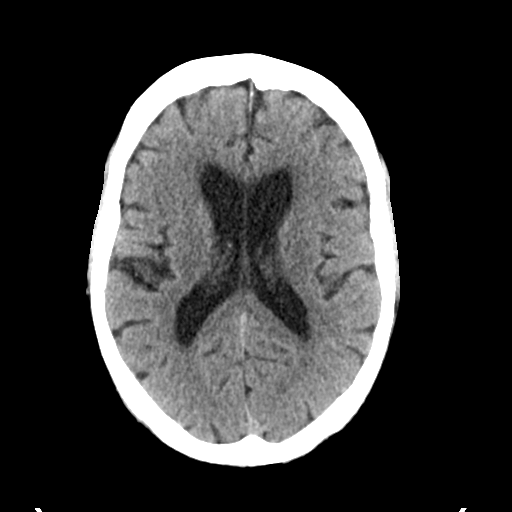
[im 18/32  brain]
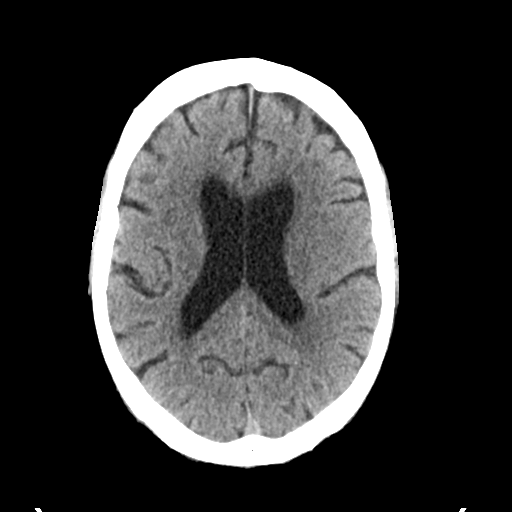
[im 18/32  bone]
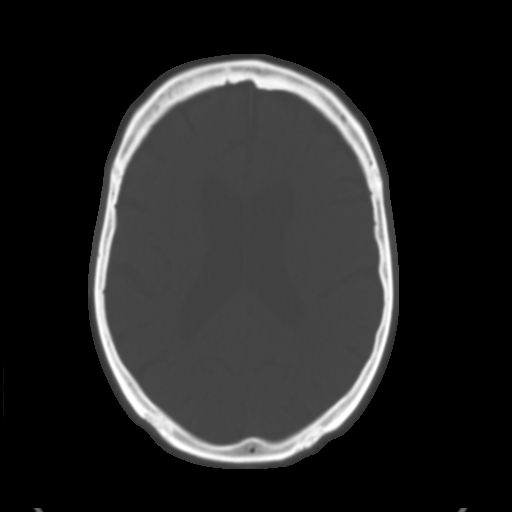
[im 20/32  brain]
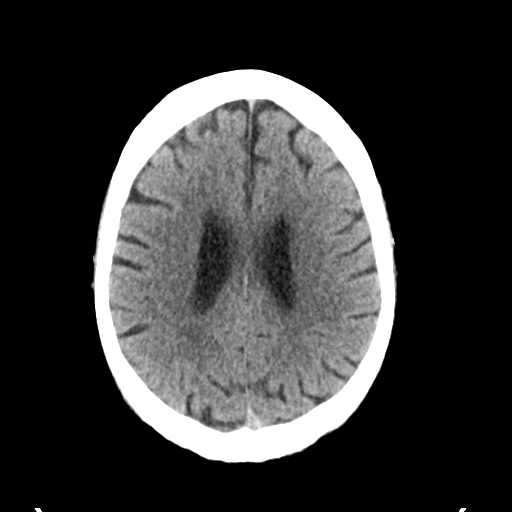
[im 22/32  brain]
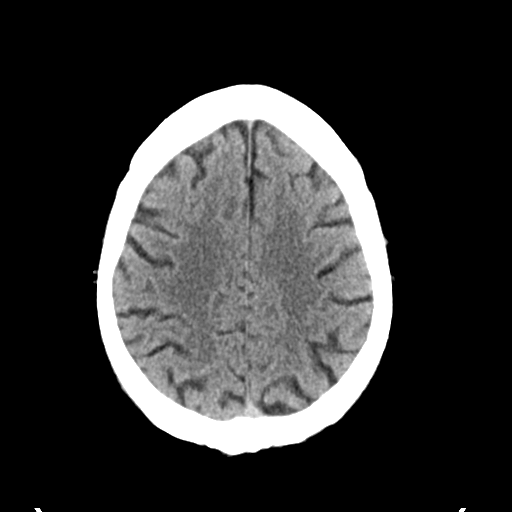
[im 24/32  brain]
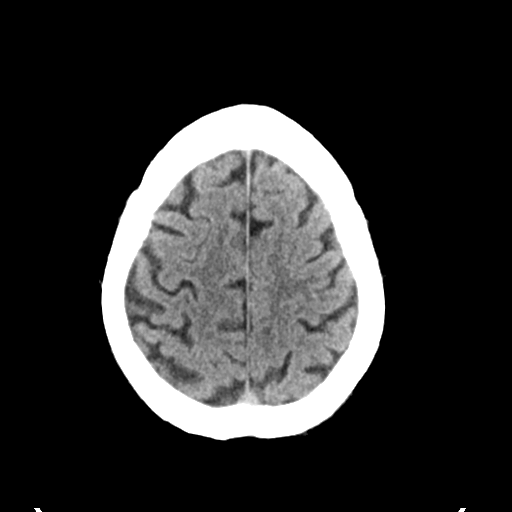
[im 26/32  brain]
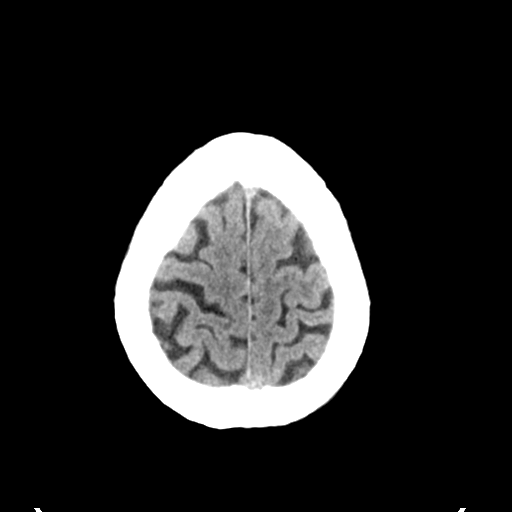
[im 26/32  bone]
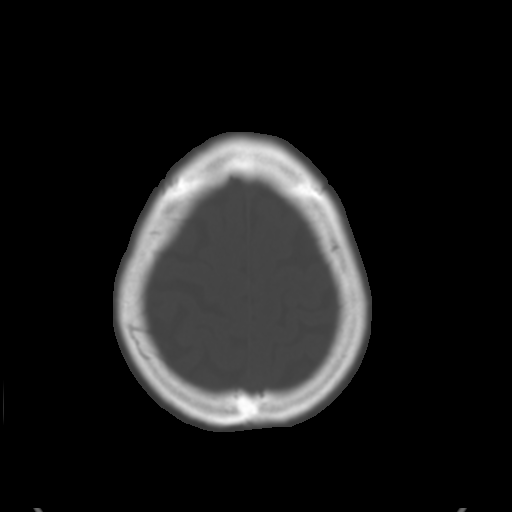
[im 28/32  brain]
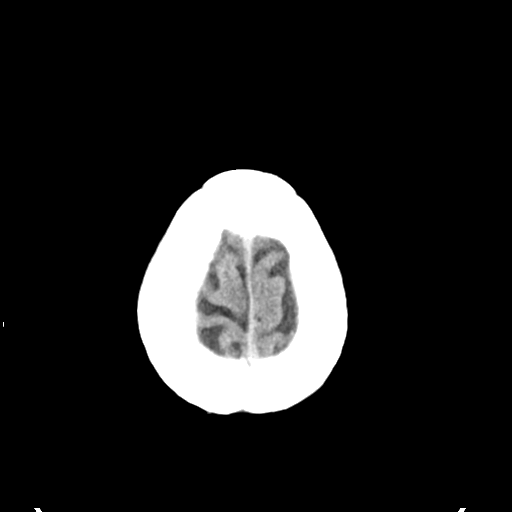
[im 30/32  brain]
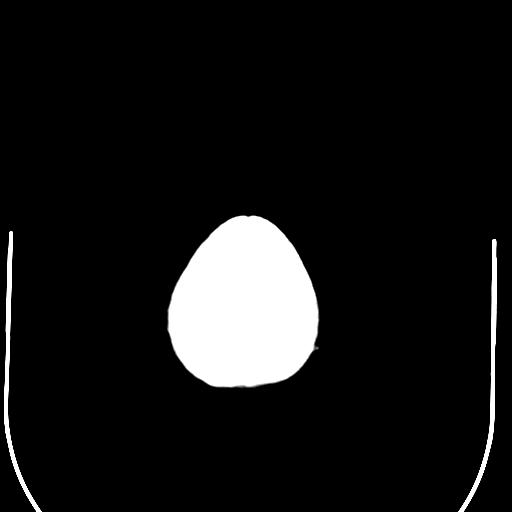

[15 of 30 positions shown; findings below may reference images not displayed]

FINDINGS: There is no evidence of mass effect, midline shift, or extra-axial
fluid collections. There is no evidence of a space-occupying lesion
or intracranial hemorrhage. There is no evidence of a cortical-based
area of acute infarction. There is generalized cerebral atrophy.
There is mild cerebellar atrophy. There is periventricular white
matter low attenuation likely secondary to microangiopathy.

The ventricles and sulci are appropriate for the patient's age. The
basal cisterns are patent.

Visualized portions of the orbits are unremarkable. The visualized
portions of the paranasal sinuses and mastoid air cells are
unremarkable. Cerebrovascular atherosclerotic calcifications are
noted.

The osseous structures are unremarkable.
IMPRESSION: 1. No acute intracranial pathology.
2. Generalized cerebral atrophy and chronic microvascular disease.

## 2016-03-19 IMAGING — MR MR HEAD W/O CM
9 of 10 series · 31 of 48 positions shown · non-contrast
Comparison: CTA head and neck 8668 hr the same day and earlier.

CLINICAL DATA: 89-year-old male with weakness, fall in bathroom. No
loss of consciousness. Initial encounter.

EXAM:
MRI HEAD WITHOUT CONTRAST
TECHNIQUE: Multiplanar, multiecho pulse sequences of the brain and surrounding
structures were obtained without intravenous contrast.

[Series 2: FLAIR · sagittal · 5.0mm · 0.47mm/px · 3 of 25 slices shown (1 of 2)]
[im 1/25]
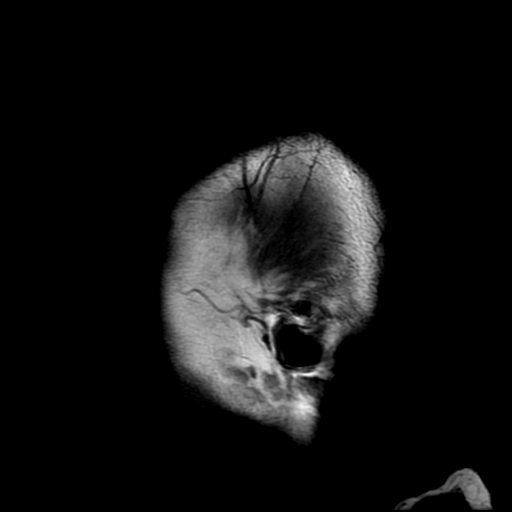
[im 13/25]
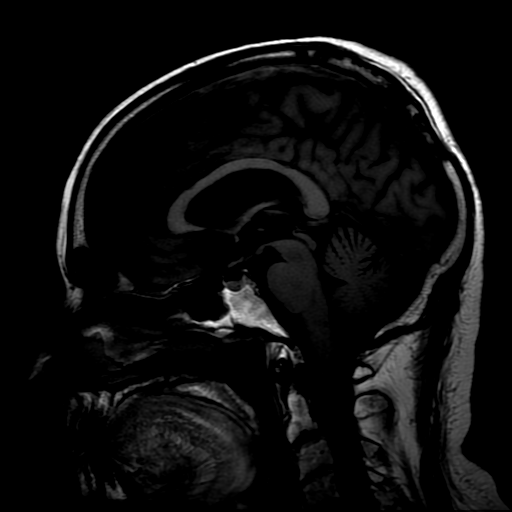
[im 25/25]
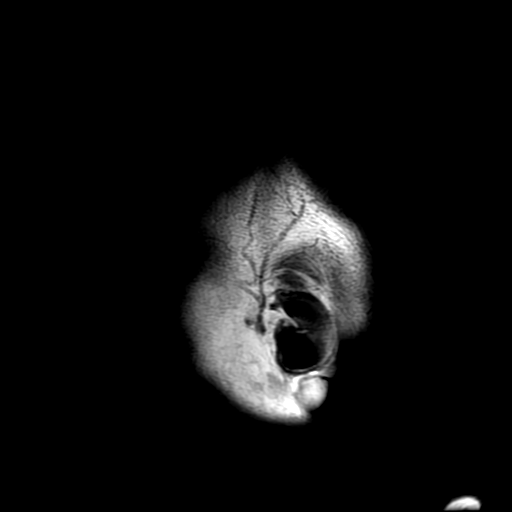

[Series 4: DWI · axial · 3.6mm · 1.02mm/px · z∈[-68,+73]mm · 6 of 82 slices shown (1 of 4)]
[im 1/82]
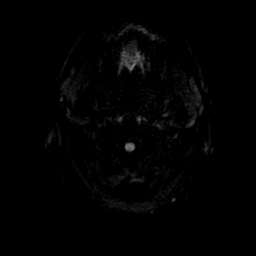
[im 17/82]
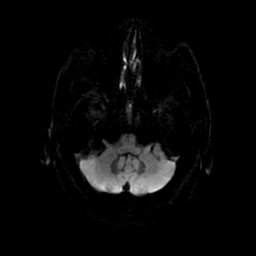
[im 33/82]
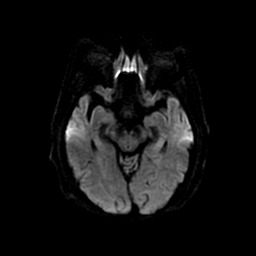
[im 49/82]
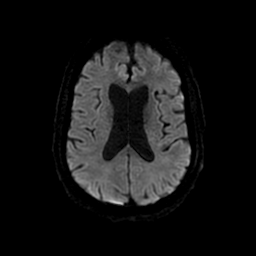
[im 65/82]
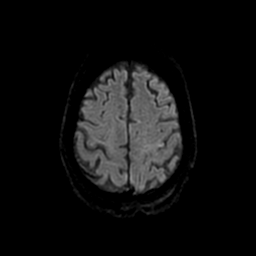
[im 82/82]
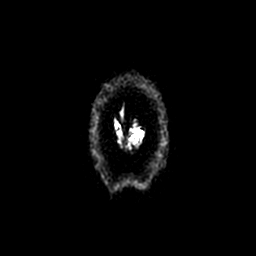

[Series 5: T2 · axial · 5.0mm · 0.43mm/px · z∈[-77,+74]mm · 2 of 27 slices shown]
[im 1/27]
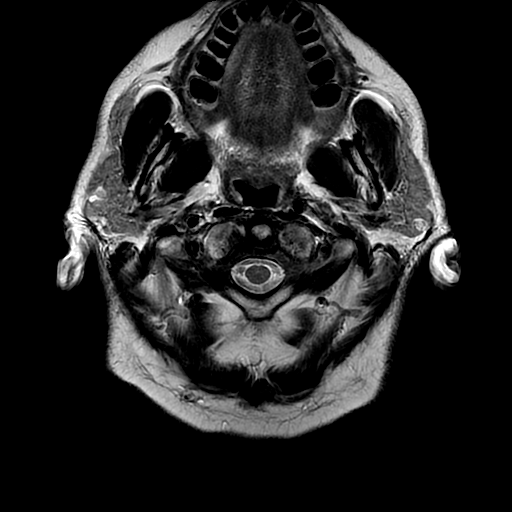
[im 27/27]
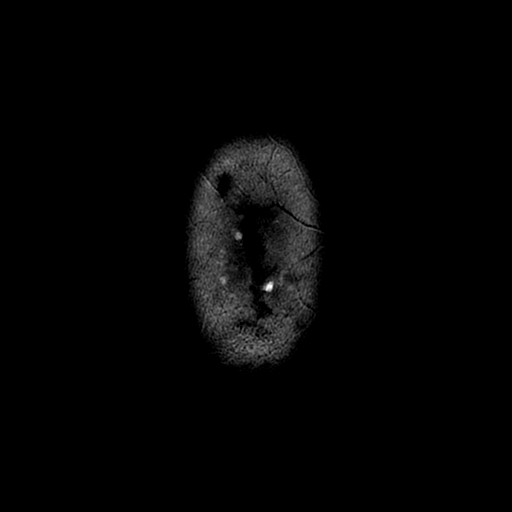

[Series 6: FLAIR · axial · 5.0mm · 0.43mm/px · z∈[-77,+74]mm · 2 of 27 slices shown (2 of 2)]
[im 1/27]
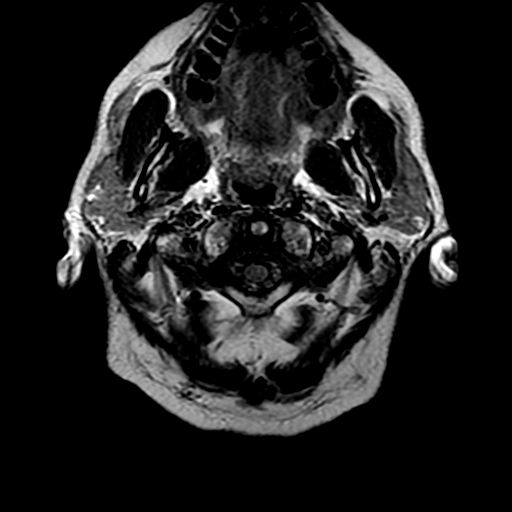
[im 27/27]
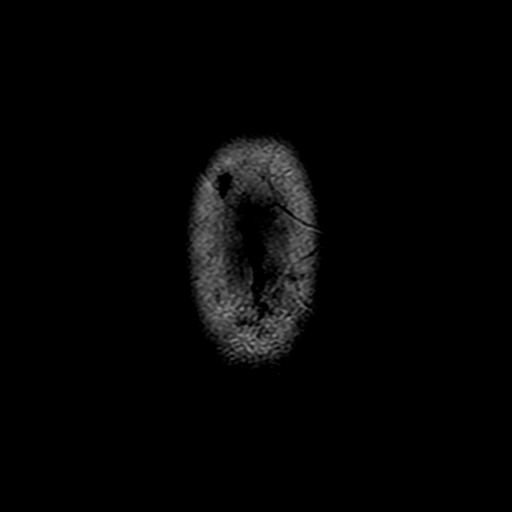

[Series 7: DWI · coronal · 5.0mm · 1.02mm/px · 6 of 80 slices shown (2 of 4)]
[im 1/80]
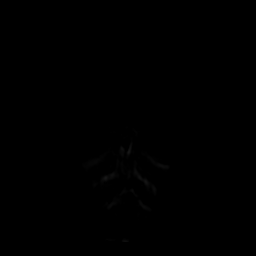
[im 16/80]
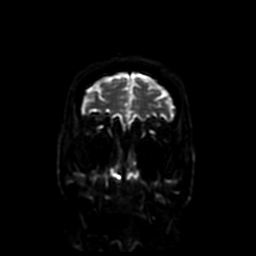
[im 32/80]
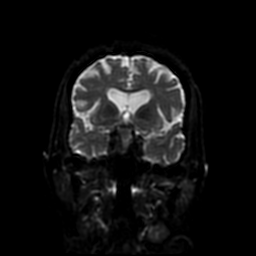
[im 48/80]
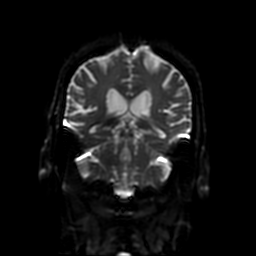
[im 64/80]
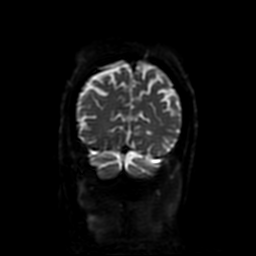
[im 80/80]
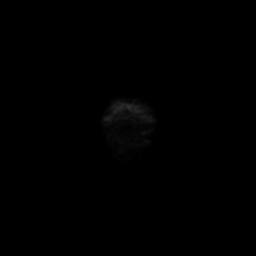

[Series 8: (person_name) · axial · 3.6mm · 0.47mm/px · z∈[-69,-22]mm · 3 of 176 slices shown]
[im 1/176]
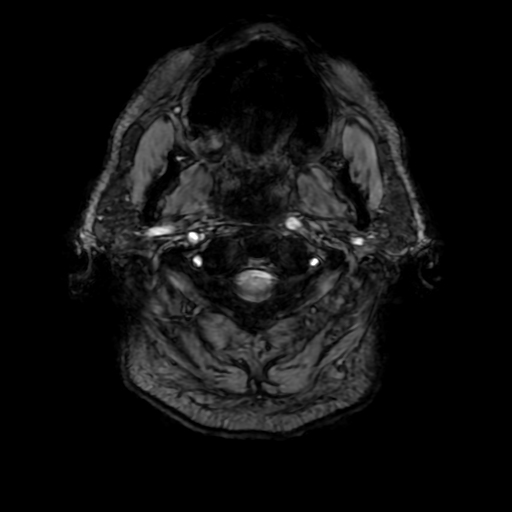
[im 27/176]
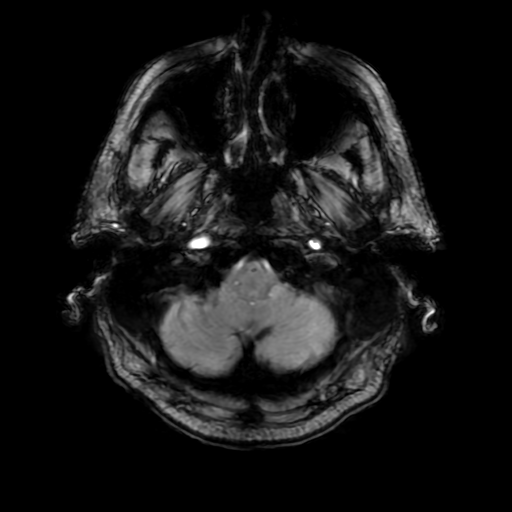
[im 54/176]
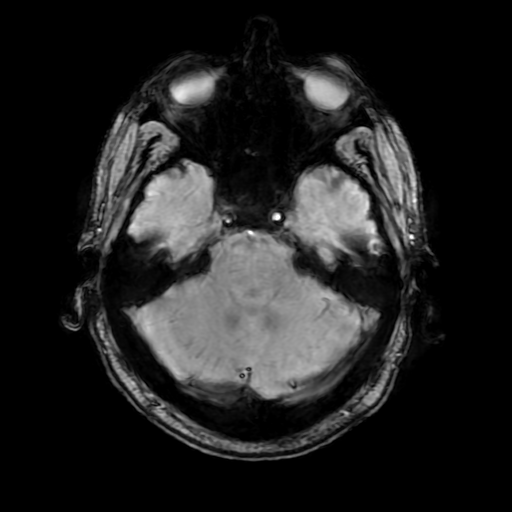

[Series 10: T2 post-contrast · coronal · 5.0mm · 0.47mm/px · 3 of 34 slices shown]
[im 1/34]
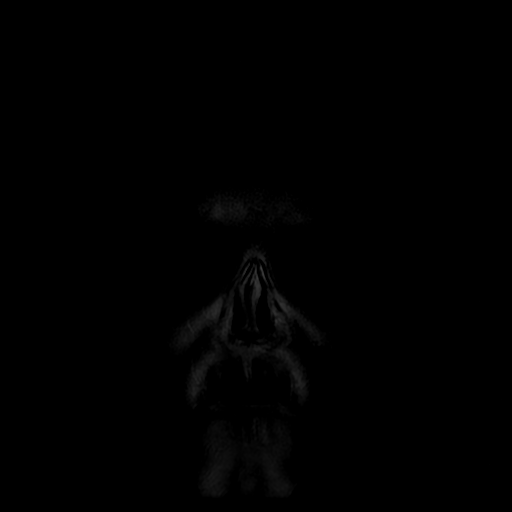
[im 17/34]
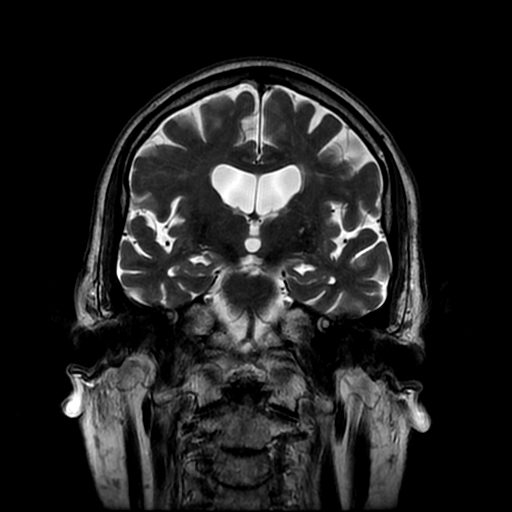
[im 34/34]
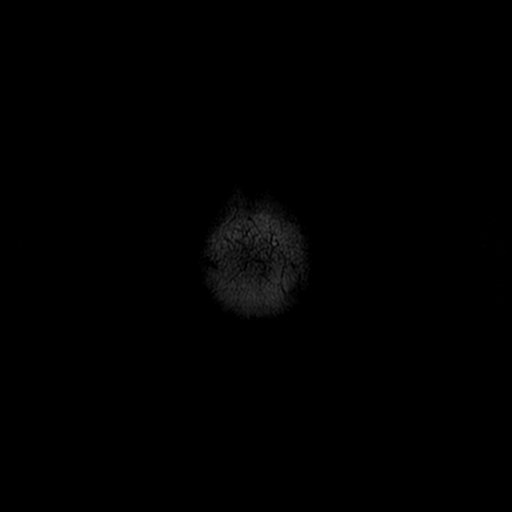

[Series 400: DWI · axial · 3.6mm · 1.02mm/px · z∈[-68,+73]mm · 3 of 41 slices shown (3 of 4)]
[im 1/41]
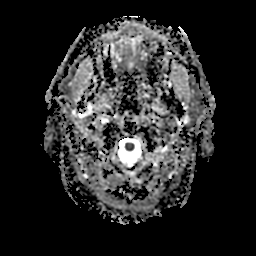
[im 21/41]
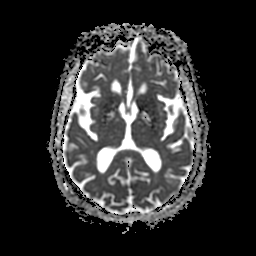
[im 41/41]
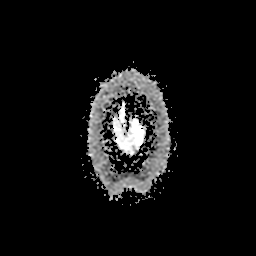

[Series 700: DWI · coronal · 5.0mm · 1.02mm/px · 3 of 40 slices shown (4 of 4)]
[im 1/40]
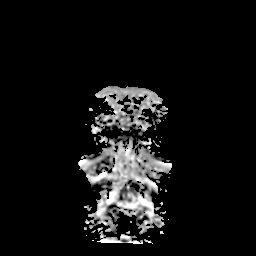
[im 20/40]
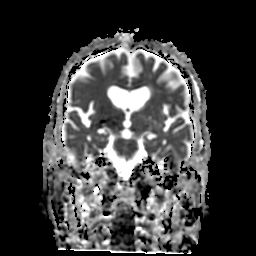
[im 40/40]
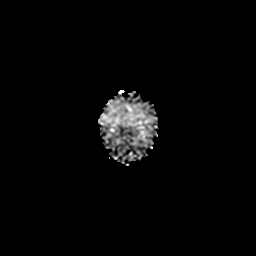

[31 of 48 positions shown; findings below may reference images not displayed]

FINDINGS: Diffusion weighted imaging reveal several small cortically based
areas of restricted diffusion in the superior left hemisphere. These
include the medial left frontal lobe pre motor area (should be left
ACA territory, series 4, image 34) as well as the posterior left
frontal lobe closer to the motor strip (series 4, image 32).

No contralateral or posterior fossa restricted diffusion. Major
intracranial vascular flow voids are preserved.

No midline shift, mass effect, evidence of mass lesion,
ventriculomegaly, extra-axial collection or acute intracranial
hemorrhage. Cervicomedullary junction and pituitary are within
normal limits. Negative visualized cervical spine.

There are scattered chronic micro hemorrhages in the brain,
including in the cerebellum. Superimposed minimal to mild for age
scattered nonspecific cerebral white matter T2 and FLAIR
hyperintensity. No cortical encephalomalacia.

Visible internal auditory structures appear normal. Mastoids are
clear. Trace paranasal sinus mucosal thickening. Postoperative
changes to the globes. Visualized scalp soft tissues are within
normal limits. Normal bone marrow signal.
IMPRESSION: 1. Several tiny acute cortical infarcts in the superior left frontal
gyrus, left MCA and left ACA territories. No mass effect or
hemorrhage.
2. Scattered chronic micro hemorrhages in the brain compatible with
chronic small vessel disease.

## 2016-03-29 ENCOUNTER — Encounter: Payer: Self-pay | Admitting: Neurology

## 2016-04-06 ENCOUNTER — Ambulatory Visit (INDEPENDENT_AMBULATORY_CARE_PROVIDER_SITE_OTHER): Payer: Medicare Other | Admitting: *Deleted

## 2016-04-06 DIAGNOSIS — I639 Cerebral infarction, unspecified: Secondary | ICD-10-CM | POA: Diagnosis not present

## 2016-04-07 NOTE — Progress Notes (Signed)
Carelink Summary Report / Loop Recorder 

## 2016-04-17 LAB — CUP PACEART REMOTE DEVICE CHECK
Date Time Interrogation Session: 20171125034303
Implantable Pulse Generator Implant Date: 20160104

## 2016-04-17 NOTE — Progress Notes (Signed)
Carelink summary report received. Battery status OK. Normal device function. No new symptom episodes, tachy episodes, brady, or pause episodes. No new AF episodes. Monthly summary reports and ROV/PRN 

## 2016-05-04 ENCOUNTER — Ambulatory Visit (INDEPENDENT_AMBULATORY_CARE_PROVIDER_SITE_OTHER): Payer: Medicare Other | Admitting: *Deleted

## 2016-05-04 DIAGNOSIS — I639 Cerebral infarction, unspecified: Secondary | ICD-10-CM

## 2016-05-05 NOTE — Progress Notes (Signed)
Carelink Summary Report / Loop Recorder 

## 2016-05-24 DIAGNOSIS — I10 Essential (primary) hypertension: Secondary | ICD-10-CM | POA: Diagnosis not present

## 2016-05-24 DIAGNOSIS — I480 Paroxysmal atrial fibrillation: Secondary | ICD-10-CM | POA: Diagnosis not present

## 2016-05-24 DIAGNOSIS — R739 Hyperglycemia, unspecified: Secondary | ICD-10-CM | POA: Diagnosis not present

## 2016-05-24 LAB — CUP PACEART REMOTE DEVICE CHECK
Implantable Pulse Generator Implant Date: 20160104
MDC IDC SESS DTM: 20171225043936

## 2016-05-27 DIAGNOSIS — Z23 Encounter for immunization: Secondary | ICD-10-CM | POA: Diagnosis not present

## 2016-05-27 DIAGNOSIS — Z0000 Encounter for general adult medical examination without abnormal findings: Secondary | ICD-10-CM | POA: Diagnosis not present

## 2016-05-27 DIAGNOSIS — I639 Cerebral infarction, unspecified: Secondary | ICD-10-CM | POA: Diagnosis not present

## 2016-05-27 DIAGNOSIS — I10 Essential (primary) hypertension: Secondary | ICD-10-CM | POA: Diagnosis not present

## 2016-05-27 DIAGNOSIS — E785 Hyperlipidemia, unspecified: Secondary | ICD-10-CM | POA: Diagnosis not present

## 2016-06-01 DIAGNOSIS — H353221 Exudative age-related macular degeneration, left eye, with active choroidal neovascularization: Secondary | ICD-10-CM | POA: Diagnosis not present

## 2016-06-03 ENCOUNTER — Ambulatory Visit (INDEPENDENT_AMBULATORY_CARE_PROVIDER_SITE_OTHER): Payer: Medicare Other | Admitting: *Deleted

## 2016-06-03 DIAGNOSIS — I639 Cerebral infarction, unspecified: Secondary | ICD-10-CM | POA: Diagnosis not present

## 2016-06-04 NOTE — Progress Notes (Signed)
Carelink Summary Report / Loop Recorder 

## 2016-06-06 LAB — CUP PACEART REMOTE DEVICE CHECK
Date Time Interrogation Session: 20180124050748
MDC IDC PG IMPLANT DT: 20160104

## 2016-06-10 DIAGNOSIS — H353221 Exudative age-related macular degeneration, left eye, with active choroidal neovascularization: Secondary | ICD-10-CM | POA: Diagnosis not present

## 2016-06-25 LAB — CUP PACEART REMOTE DEVICE CHECK
MDC IDC PG IMPLANT DT: 20160104
MDC IDC SESS DTM: 20180223050710

## 2016-06-30 DIAGNOSIS — N402 Nodular prostate without lower urinary tract symptoms: Secondary | ICD-10-CM | POA: Diagnosis not present

## 2016-06-30 DIAGNOSIS — R351 Nocturia: Secondary | ICD-10-CM | POA: Diagnosis not present

## 2016-06-30 DIAGNOSIS — N401 Enlarged prostate with lower urinary tract symptoms: Secondary | ICD-10-CM | POA: Diagnosis not present

## 2016-06-30 DIAGNOSIS — H353221 Exudative age-related macular degeneration, left eye, with active choroidal neovascularization: Secondary | ICD-10-CM | POA: Diagnosis not present

## 2016-06-30 DIAGNOSIS — R972 Elevated prostate specific antigen [PSA]: Secondary | ICD-10-CM | POA: Diagnosis not present

## 2016-07-05 ENCOUNTER — Ambulatory Visit (INDEPENDENT_AMBULATORY_CARE_PROVIDER_SITE_OTHER): Payer: Medicare Other | Admitting: *Deleted

## 2016-07-05 DIAGNOSIS — I639 Cerebral infarction, unspecified: Secondary | ICD-10-CM

## 2016-07-06 NOTE — Progress Notes (Signed)
Carelink Summary Report / Loop Recorder 

## 2016-07-13 DIAGNOSIS — H353221 Exudative age-related macular degeneration, left eye, with active choroidal neovascularization: Secondary | ICD-10-CM | POA: Diagnosis not present

## 2016-07-17 LAB — CUP PACEART REMOTE DEVICE CHECK
Implantable Pulse Generator Implant Date: 20160104
MDC IDC SESS DTM: 20180325054412

## 2016-07-28 DIAGNOSIS — H353221 Exudative age-related macular degeneration, left eye, with active choroidal neovascularization: Secondary | ICD-10-CM | POA: Diagnosis not present

## 2016-08-03 ENCOUNTER — Ambulatory Visit (INDEPENDENT_AMBULATORY_CARE_PROVIDER_SITE_OTHER): Payer: Medicare Other | Admitting: *Deleted

## 2016-08-03 DIAGNOSIS — I639 Cerebral infarction, unspecified: Secondary | ICD-10-CM

## 2016-08-03 NOTE — Progress Notes (Signed)
Carelink Summary Report / Loop Recorder 

## 2016-08-06 ENCOUNTER — Emergency Department (HOSPITAL_COMMUNITY)
Admission: EM | Admit: 2016-08-06 | Discharge: 2016-08-06 | Disposition: A | Discharge status: Home / Self Care | Payer: Medicare Other | Attending: Emergency Medicine | Admitting: Emergency Medicine

## 2016-08-06 ENCOUNTER — Encounter (HOSPITAL_COMMUNITY): Payer: Self-pay | Admitting: Emergency Medicine

## 2016-08-06 DIAGNOSIS — Z7984 Long term (current) use of oral hypoglycemic drugs: Secondary | ICD-10-CM | POA: Insufficient documentation

## 2016-08-06 DIAGNOSIS — I2510 Atherosclerotic heart disease of native coronary artery without angina pectoris: Secondary | ICD-10-CM | POA: Diagnosis not present

## 2016-08-06 DIAGNOSIS — Z7901 Long term (current) use of anticoagulants: Secondary | ICD-10-CM | POA: Diagnosis not present

## 2016-08-06 DIAGNOSIS — Z951 Presence of aortocoronary bypass graft: Secondary | ICD-10-CM | POA: Diagnosis not present

## 2016-08-06 DIAGNOSIS — I10 Essential (primary) hypertension: Secondary | ICD-10-CM | POA: Insufficient documentation

## 2016-08-06 DIAGNOSIS — Z79899 Other long term (current) drug therapy: Secondary | ICD-10-CM | POA: Insufficient documentation

## 2016-08-06 DIAGNOSIS — Z8673 Personal history of transient ischemic attack (TIA), and cerebral infarction without residual deficits: Secondary | ICD-10-CM | POA: Insufficient documentation

## 2016-08-06 DIAGNOSIS — R001 Bradycardia, unspecified: Secondary | ICD-10-CM | POA: Diagnosis not present

## 2016-08-06 DIAGNOSIS — Z7902 Long term (current) use of antithrombotics/antiplatelets: Secondary | ICD-10-CM | POA: Insufficient documentation

## 2016-08-06 DIAGNOSIS — R310 Gross hematuria: Secondary | ICD-10-CM | POA: Diagnosis not present

## 2016-08-06 DIAGNOSIS — R319 Hematuria, unspecified: Secondary | ICD-10-CM | POA: Diagnosis present

## 2016-08-06 LAB — URINALYSIS, ROUTINE W REFLEX MICROSCOPIC: Squamous Epithelial / LPF: NONE SEEN

## 2016-08-06 NOTE — Discharge Instructions (Signed)
Hold your Eliquis for 2 days, then restart it on Monday.  Follow up with Dr. Alinda Money.  Return to the emergency department if you have any lower abdominal pain, inability to urinate, fever, or other worsening symptoms.

## 2016-08-06 NOTE — ED Notes (Signed)
Pt was bladder scanned after elimination, Pt has 309 ml left in bladder per bladder scanner

## 2016-08-06 NOTE — ED Provider Notes (Addendum)
[facility] DEPT Provider Note   CSN: [id number] Arrival date & time: 08/06/16  1829     History   Chief Complaint Chief Complaint  Patient presents with  . Hematuria    HPI [name] is a 83 y.o. male.  Patient is a 83 year old male with a history of BPH, coronary artery disease, hypertension, paroxysmal atrial for relation on Eliquis with and hyperlipidemia who presents with hematuria. He states about 12:00 this afternoon he noticed some blood in his urine. He went to an [facility] walk-in clinic where they also noticed blood in his urine and sent him here for further evaluation. He had a CBC done there which showed a normal hemoglobin. They were not able to analyze his urine given the blood. He states he's able to urinate normally. He hasn't noted any clots. He did take his Eliquis today. He denies any dizziness. He denies any history of hematuria in the past. No recent procedures or injuries to his bladder or prostate. He denies any associated abdominal or back pain.      Past Medical History:  Diagnosis Date  . BPH (benign prostatic hyperplasia)   . Coronary atherosclerosis of artery bypass graft 12/20/2013   CASHD with CABG 2000 with LIMA to LAD, SV graft to diagonal, SVG to OM, and saphenous vein graft to distal RCA.     . Essential hypertension 12/20/2013  . GERD (gastroesophageal reflux disease)   . Hyperlipidemia 12/20/2013  . Paroxysmal atrial fibrillation (HCC)   . Right bundle branch block 12/20/2013  . Stroke [facility])     Patient Active Problem List   Diagnosis Date Noted  . Intracranial vascular stenosis 10/21/2015  . Chronic anticoagulation 02/05/2015  . Paroxysmal atrial fibrillation ([place]) 12/26/2014  . Other chest pain   . HLD (hyperlipidemia)   . Costochondritis   . Chest pain 12/04/2014  . GERD (gastroesophageal reflux disease)   . Gastroesophageal reflux disease without esophagitis   . BPH (benign prostatic hyperplasia)   . Cerebral infarction due to  embolism of left middle cerebral artery ([place]) 10/25/2014  . Coronary atherosclerosis of artery bypass graft 12/20/2013  . Essential hypertension 12/20/2013  . Hyperlipidemia 12/20/2013  . Right bundle branch block 12/20/2013    Past Surgical History:  Procedure Laterality Date  . CARDIAC SURGERY  2000   BYPASS  . CARDIAC SURGERY     quadruple by pass surgery  . HERNIA REPAIR  2005  . LOOP RECORDER IMPLANT    . PROSTATE SURGERY  1985  . TEE WITHOUT CARDIOVERSION N/A 04/02/2014   Procedure: TRANSESOPHAGEAL ECHOCARDIOGRAM (TEE);  Surgeon: Lelon Perla, MD;  Location: [facility] ENDOSCOPY;  Service: Cardiovascular;  Laterality: N/A;       Home Medications    Prior to Admission medications   Medication Sig Start Date End Date Taking? Authorizing Provider  amLODipine (NORVASC) 5 MG tablet Take 1 tablet (5 mg total) by mouth daily. Patient taking differently: Take 5 mg by mouth at bedtime.  01/02/15  Yes Belva Crome, MD  apixaban (ELIQUIS) 5 MG TABS tablet Take 1 tablet (5 mg total) by mouth 2 (two) times daily. 04/09/15  Yes Belva Crome, MD  atenolol (TENORMIN) 25 MG tablet Take 12.5 mg by mouth at bedtime.    Yes Historical Provider, MD  atorvastatin (LIPITOR) 40 MG tablet Take 1 tablet (40 mg total) by mouth daily. Patient taking differently: Take 40 mg by mouth at bedtime.  02/06/15  Yes Belva Crome, MD  ezetimibe Chauncey Cruel)  10 MG tablet Take 5 mg by mouth at bedtime.   Yes Historical Provider, MD  finasteride (PROSCAR) 5 MG tablet Take 0.5 tablets (2.5 mg total) by mouth daily. Patient taking differently: Take 2.5 mg by mouth at bedtime.  04/17/13  Yes Belva Crome, MD  hydrochlorothiazide (HYDRODIURIL) 25 MG tablet Take 12.5 mg by mouth every other day.   Yes Historical Provider, MD  lisinopril (PRINIVIL,ZESTRIL) 40 MG tablet Take 1 tablet (40 mg total) by mouth daily. 04/17/13  Yes Belva Crome, MD  metFORMIN (GLUCOPHAGE) 500 MG tablet Take 0.5 tablets (250 mg total) by mouth daily  with breakfast. Patient taking differently: Take 250 mg by mouth at bedtime.  04/17/13  Yes Belva Crome, MD  Multiple Vitamin (MULTIVITAMIN WITH MINERALS) TABS tablet Take 1 tablet by mouth daily.   Yes Historical Provider, MD  Multiple Vitamins-Minerals (PRESERVISION AREDS 2 PO) Take 1 capsule by mouth 2 (two) times daily.    Yes Historical Provider, MD  pantoprazole (PROTONIX) 40 MG tablet Take 40 mg by mouth daily.   Yes Historical Provider, MD  traMADol (ULTRAM) 50 MG tablet Take 50 mg by mouth every 6 (six) hours as needed for moderate pain.    Yes Historical Provider, MD    Family History Family History  Problem Relation Age of Onset  . Hypertension Mother     Social History Social History  Substance Use Topics  . Smoking status: Never Smoker  . Smokeless tobacco: Never Used  . Alcohol use 0.6 oz/week    1 Glasses of wine per week     Comment: SOCIAL     Allergies   Monosodium glutamate   Review of Systems Review of Systems  Constitutional: Negative for chills, diaphoresis, fatigue and fever.  HENT: Negative for congestion, rhinorrhea and sneezing.   Eyes: Negative.   Respiratory: Negative for cough, chest tightness and shortness of breath.   Cardiovascular: Negative for chest pain and leg swelling.  Gastrointestinal: Negative for abdominal pain, blood in stool, diarrhea, nausea and vomiting.  Genitourinary: Positive for hematuria. Negative for difficulty urinating, flank pain and frequency.  Musculoskeletal: Negative for arthralgias and back pain.  Skin: Negative for rash.  Neurological: Negative for dizziness, speech difficulty, weakness, numbness and headaches.     Physical Exam Updated Vital Signs BP (!) 180/51   Pulse (!) 47   Temp 97.7 F (36.5 C) (Oral)   Resp 13   Ht 5\' 11"  (1.803 m)   Wt 184 lb (83.5 kg)   SpO2 98%   BMI 25.66 kg/m   Physical Exam  Constitutional: He is oriented to person, place, and time. He appears well-developed and  well-nourished.  HENT:  Head: Normocephalic and atraumatic.  Eyes: Pupils are equal, round, and reactive to light.  Neck: Normal range of motion. Neck supple.  Cardiovascular: Normal rate, regular rhythm and normal heart sounds.   Pulmonary/Chest: Effort normal and breath sounds normal. No respiratory distress. He has no wheezes. He has no rales. He exhibits no tenderness.  Abdominal: Soft. Bowel sounds are normal. There is no tenderness. There is no rebound and no guarding.  Musculoskeletal: Normal range of motion. He exhibits no edema.  Lymphadenopathy:    He has no cervical adenopathy.  Neurological: He is alert and oriented to person, place, and time.  Skin: Skin is warm and dry. No rash noted.  Psychiatric: He has a normal mood and affect.     ED Treatments / Results  Labs (all labs ordered  are listed, but only abnormal results are displayed) Labs Reviewed  URINALYSIS, ROUTINE W REFLEX MICROSCOPIC - Abnormal; Notable for the following:       Result Value   Color, Urine RED (*)    APPearance TURBID (*)    Glucose, UA   (*)    Value: TEST NOT REPORTED DUE TO COLOR INTERFERENCE OF URINE PIGMENT   Hgb urine dipstick   (*)    Value: TEST NOT REPORTED DUE TO COLOR INTERFERENCE OF URINE PIGMENT   Bilirubin Urine   (*)    Value: TEST NOT REPORTED DUE TO COLOR INTERFERENCE OF URINE PIGMENT   Ketones, ur   (*)    Value: TEST NOT REPORTED DUE TO COLOR INTERFERENCE OF URINE PIGMENT   Protein, ur   (*)    Value: TEST NOT REPORTED DUE TO COLOR INTERFERENCE OF URINE PIGMENT   Nitrite   (*)    Value: TEST NOT REPORTED DUE TO COLOR INTERFERENCE OF URINE PIGMENT   Leukocytes, UA   (*)    Value: TEST NOT REPORTED DUE TO COLOR INTERFERENCE OF URINE PIGMENT   Bacteria, UA MANY (*)    All other components within normal limits  URINE CULTURE      EKG  EKG Interpretation  Date/Time:  Friday August 06 2016 19:54:48 EDT Ventricular Rate:  47 PR Interval:    QRS Duration: 154 QT  Interval:  512 QTC Calculation: 453 R Axis:   12 Text Interpretation:  Sinus bradycardia Right bundle branch block since last tracing no significant change Confirmed by Janeene Sand  MD, Niema Carrara ([id number]) on 08/06/2016 8:14:39 PM       Radiology No results found.  Procedures Procedures (including critical care time)  Medications Ordered in ED Medications - No data to display   Initial Impression / Assessment and Plan / ED Course  I have reviewed the triage vital signs and the nursing notes.  Pertinent labs & imaging results that were available during my care of the patient were reviewed by me and considered in my medical decision making (see chart for details).     Patient presents with painless gross hematuria. His urinalysis was not able to be analyzed to the hematuria but he doesn't have any other symptoms that would be more concerning for an infection. There is no fever. He has no associated abdominal pain which would warrant further imaging studies. Bladder scan showed a postvoid residual of 300 cc. He does seem to be able to urinate okay.  I discussed that his post void residual is borderline and we could go either way in putting a catheter in. He at this point would want to avoid the catheter. He does agree to return if he has any symptoms of retention. I did advise him to stop his Eliquis for the next 2 days and then restarted on Monday. I advised him to call his urologist on Monday to obtain outpatient follow-up. He was given precautions to return to the ED if he has any onset of fever, abdominal pain, difficulty urinating or other worsening symptoms. He was encouraged to increase his water intake.He is noted to be in a sinus rhythm. His heart rate is bradycardic in the 40s. He states this is typical for him and this is verified from chart review.  Final Clinical Impressions(s) / ED Diagnoses   Final diagnoses:  Gross hematuria    New Prescriptions New Prescriptions   No medications  on file     Malvin Johns, MD 08/06/16  2042    Malvin Johns, MD 08/06/16 2042

## 2016-08-06 NOTE — ED Triage Notes (Addendum)
Patient sent from [facility] reports 2 episodes of bright red blood in the urine since lunch today. Denies weakness, dizziness, abdominal pain. Ambulatory to treatment room. Patient reports taking eliquis

## 2016-08-08 LAB — URINE CULTURE: Culture: NO GROWTH

## 2016-08-10 DIAGNOSIS — R310 Gross hematuria: Secondary | ICD-10-CM | POA: Diagnosis not present

## 2016-08-11 DIAGNOSIS — R310 Gross hematuria: Secondary | ICD-10-CM | POA: Diagnosis not present

## 2016-08-20 LAB — CUP PACEART REMOTE DEVICE CHECK
MDC IDC PG IMPLANT DT: 20160104
MDC IDC SESS DTM: 20180424061017

## 2016-08-25 DIAGNOSIS — H353221 Exudative age-related macular degeneration, left eye, with active choroidal neovascularization: Secondary | ICD-10-CM | POA: Diagnosis not present

## 2016-09-02 ENCOUNTER — Ambulatory Visit (INDEPENDENT_AMBULATORY_CARE_PROVIDER_SITE_OTHER): Payer: Medicare Other | Admitting: *Deleted

## 2016-09-02 DIAGNOSIS — I639 Cerebral infarction, unspecified: Secondary | ICD-10-CM | POA: Diagnosis not present

## 2016-09-02 NOTE — Progress Notes (Signed)
Carelink Summary Report / Loop Recorder 

## 2016-09-03 LAB — CUP PACEART REMOTE DEVICE CHECK
MDC IDC PG IMPLANT DT: 20160104
MDC IDC SESS DTM: 20180524061032

## 2016-09-08 DIAGNOSIS — R8271 Bacteriuria: Secondary | ICD-10-CM | POA: Diagnosis not present

## 2016-09-08 DIAGNOSIS — R310 Gross hematuria: Secondary | ICD-10-CM | POA: Diagnosis not present

## 2016-09-22 DIAGNOSIS — H353121 Nonexudative age-related macular degeneration, left eye, early dry stage: Secondary | ICD-10-CM | POA: Diagnosis not present

## 2016-09-22 DIAGNOSIS — H43812 Vitreous degeneration, left eye: Secondary | ICD-10-CM | POA: Diagnosis not present

## 2016-09-28 DIAGNOSIS — R310 Gross hematuria: Secondary | ICD-10-CM | POA: Diagnosis not present

## 2016-10-04 ENCOUNTER — Ambulatory Visit (INDEPENDENT_AMBULATORY_CARE_PROVIDER_SITE_OTHER): Payer: Medicare Other | Admitting: *Deleted

## 2016-10-04 DIAGNOSIS — I639 Cerebral infarction, unspecified: Secondary | ICD-10-CM

## 2016-10-04 NOTE — Progress Notes (Signed)
Carelink Summary Report / Loop Recorder 

## 2016-10-15 LAB — CUP PACEART REMOTE DEVICE CHECK
Implantable Pulse Generator Implant Date: 20160104
MDC IDC SESS DTM: 20180623064841

## 2016-10-15 NOTE — Progress Notes (Signed)
Carelink summary report received. Battery status OK. Normal device function. No new symptom episodes, tachy episodes, brady, or pause episodes. No new AF episodes. Monthly summary reports and ROV/PRN 

## 2016-10-20 DIAGNOSIS — H353112 Nonexudative age-related macular degeneration, right eye, intermediate dry stage: Secondary | ICD-10-CM | POA: Diagnosis not present

## 2016-10-20 DIAGNOSIS — H353221 Exudative age-related macular degeneration, left eye, with active choroidal neovascularization: Secondary | ICD-10-CM | POA: Diagnosis not present

## 2016-10-20 DIAGNOSIS — H35373 Puckering of macula, bilateral: Secondary | ICD-10-CM | POA: Diagnosis not present

## 2016-10-21 ENCOUNTER — Ambulatory Visit: Payer: Medicare Other | Admitting: Neurology

## 2016-11-01 ENCOUNTER — Ambulatory Visit (INDEPENDENT_AMBULATORY_CARE_PROVIDER_SITE_OTHER): Payer: Medicare Other | Admitting: *Deleted

## 2016-11-01 DIAGNOSIS — I639 Cerebral infarction, unspecified: Secondary | ICD-10-CM | POA: Diagnosis not present

## 2016-11-02 ENCOUNTER — Encounter: Payer: Self-pay | Admitting: Neurology

## 2016-11-02 ENCOUNTER — Ambulatory Visit (INDEPENDENT_AMBULATORY_CARE_PROVIDER_SITE_OTHER): Payer: Medicare Other | Admitting: Neurology

## 2016-11-02 VITALS — BP 175/71 | HR 50 | Wt 185.2 lb

## 2016-11-02 DIAGNOSIS — I679 Cerebrovascular disease, unspecified: Secondary | ICD-10-CM | POA: Diagnosis not present

## 2016-11-02 DIAGNOSIS — I480 Paroxysmal atrial fibrillation: Secondary | ICD-10-CM | POA: Diagnosis not present

## 2016-11-02 DIAGNOSIS — E785 Hyperlipidemia, unspecified: Secondary | ICD-10-CM | POA: Diagnosis not present

## 2016-11-02 DIAGNOSIS — I63412 Cerebral infarction due to embolism of left middle cerebral artery: Secondary | ICD-10-CM | POA: Diagnosis not present

## 2016-11-02 DIAGNOSIS — Z7901 Long term (current) use of anticoagulants: Secondary | ICD-10-CM | POA: Diagnosis not present

## 2016-11-02 DIAGNOSIS — I10 Essential (primary) hypertension: Secondary | ICD-10-CM

## 2016-11-02 NOTE — Progress Notes (Addendum)
STROKE NEUROLOGY FOLLOW UP NOTE  NAME: [name] DOB: 04/23/1922  REASON FOR VISIT: stroke follow up HISTORY FROM: pt and chart  Today we had the pleasure of seeing TYHEIM Velez in follow-up at our Neurology Clinic. Pt was accompanied by no one.   History Summary [name] is a 83 y.o. male with history of hypertension, hyperlipidemia, coronary artery disease with coronary artery bypass graft surgery in 2000 was admitted on 03/30/14 with generalized weakness after falling. He did not lose consciousness. No speech abnormalities were noted. He finally got some help to get to the bed and EMS was called.MRI showed punctate left MCA and ACA territory infarcts, embolic pattern. CTA also showed moderate to severe bilateral ICA siphon stenosis with calcified plaques as well as diffuse athero of intrancranial vessels. TTE and TEE unremarkable. His symptoms resolved and he was discharged with ASA and pravastatin.   06/17/14 follow up - the patient has been doing well. His PCP changed him from ASA to plavix. No recurrent strokes. His Bp in clinic today 156/60. He stated that at home his BP normally 130/60. He has avastin injection for macular degeneration. He also enrolled into a study and had loop recorder placed at office setting as an outpt. So far no afib episodes found.   10/22/14 follow up - the patient has been doing well. No recurrent stroke like symptoms. Finished off avastin injection for macular degeneration and vision is better. Loop recorder showed no afib so far. Currently on ASA and plavix. BP today 139/54. However, he stated that around mid morning, he feels some dizziness but at lunch time, he is OK. He is taking 4 BP meds all in the morning, which could be explanation.  04/24/15 follow up - the patient has been doing well. No recurrent stroke like symptoms. He was found to have pAfib on loop recorder in 12/2014. He was put on Xarelto and stopped ASA and plavix. However, [facility] not [facility] but OK to cover for eliquis. He is currently still on Xarelto 15mg  daily while waiting for eliquis to be shipped out from [place]. BP stable 131/54. No other complains.  10/21/15 follow up - pt has been doing well. No stroke like symptoms. On eliquis now from [place]. Follows with cardiology Dr. Tamala Julian and Dr. Rayann Heman. Did not check BP at home, and today 126/54. No other complains.   Interval History During the interval time, pt has been doing well. No complains. Still on eliquis 5mg  bid. Need to request recent lab from PCP. BP today high at 174/71. However, pt stated that at home his BP at 130-150s.   REVIEW OF SYSTEMS: Full 14 system review of systems performed and notable only for those listed below and in HPI above, all others are negative:  Constitutional:   Cardiovascular: leg swelling Ear/Nose/Throat:   Skin:  Eyes:  Loss of vision Respiratory:   Gastroitestinal:   Genitourinary:  Hematology/Lymphatic:  Bleeding easily Endocrine:  Musculoskeletal:  Joint pain Allergy/Immunology:   Neurological:   Psychiatric:  Sleep:   The following represents the patient's updated allergies and side effects list: Allergies  Allergen Reactions  . Monosodium Glutamate Anaphylaxis    The neurologically relevant items on the patient's problem list were reviewed on today's visit.  Neurologic Examination  A problem focused neurological exam (12 or more points of the single system neurologic examination, vital signs counts as 1 point, cranial nerves count for 8 points) was performed.  Blood  pressure (!) 175/71, pulse (!) 50, weight 185 lb 3.2 oz (84 kg).  General - Well nourished, well developed, in no apparent distress.  Ophthalmologic - fundi not visualized due to small pupils.  Cardiovascular - Regular rate and rhythm with no murmur.  Mental Status -  Level of arousal and orientation to time, place, and person were intact. Language including expression, naming,  repetition, comprehension was assessed and found intact.  Cranial Nerves II - XII - II - Visual field intact OU III, IV, VI - Extraocular movements intact. V - Facial sensation intact bilaterally. VII - Facial movement intact bilaterally. VIII - Hearing & vestibular intact bilaterally. X - Palate elevates symmetrically. XI - Chin turning & shoulder shrug intact bilaterally. XII - Tongue protrusion intact.  Motor Strength - The patient's strength was normal in all extremities and pronator drift was absent.  Bulk was normal and fasciculations were absent.   Motor Tone - Muscle tone was assessed at the neck and appendages and was normal.  Reflexes - The patient's reflexes were normal in all extremities and he had no pathological reflexes.  Sensory - Light touch, temperature/pinprick, vibration and proprioception, and Romberg testing were assessed and were normal.    Coordination - The patient had normal movements in the hands and feet with no ataxia or dysmetria.  Tremor was absent.  Gait and Station - mildly slow but steady without tendency to fall.  Data reviewed: I personally reviewed the images and agree with the radiology interpretations.  Ct Angio Head and Neck W/cm &/or Wo Cm 03/30/2014  1. Extracranial carotid arteries with no significant atherosclerotic stenosis, but tortuosity and a kinked appearance more so on the right.  2. Moderate to severe bilateral ICA siphon atherosclerotic stenosis related to bulky calcified plaque. High-grade stenosis of the right ICA anterior genu.  3. Otherwise mild anterior circulation atherosclerosis, mild right MCA M1 stenosis.  4. Overall moderate bilateral vertebral artery atherosclerosis in stenosis; moderate distal right vertebral artery and left vertebral artery origin stenoses.  5. Otherwise posterior circulation remarkable for mild to moderate left PCA atherosclerosis and stenosis.  6. Stable and negative CT appearance of the  brain.   Chest 2 View 03/30/2014  Underlying emphysematous change. No edema or consolidation. No pneumothorax. Heart borderline enlarged.   Ct Head Wo Contrast 03/30/2014  1. No acute intracranial pathology.  2. Generalized cerebral atrophy and chronic microvascular disease.   Mr Brain Wo Contrast 03/30/2014  1. Several tiny acute cortical infarcts in the superior left frontal gyrus, left MCA and left ACA territories. No mass effect or hemorrhage.  2. Scattered chronic micro hemorrhages in the brain compatible with chronic small vessel disease.   2D Echocardiogram EF 60-65% with no source of embolus.   TEE - normal LV function; negative saline microcavitation study  Loop recorder - pAfib found in 12/2014  Component     Latest Ref Rng 03/31/2014  Cholesterol     0 - 200 mg/dL 147  Triglycerides     <150 mg/dL 165 (H)  HDL     >39 mg/dL 32 (L)  Total CHOL/HDL Ratio      4.6  VLDL     0 - 40 mg/dL 33  LDL (calc)     0 - 99 mg/dL 82  Hemoglobin A1C     <5.7 % 6.1 (H)  Mean Plasma Glucose     <117 mg/dL 128 (H)    Assessment: As you may recall, he is a 83 y.o. Caucasian  male with PMH of HTN, HLD, CAD with CABG in 2000 was admitted on 03/30/14 for punctate left MCA and ACA territory infarcts, embolic pattern arrhythmia vs. Large vessel asthro. CTA also showed moderate to severe bilateral ICA siphon stenosis with calcified plaques as well as diffuse athero of intrancranial vessels. TTE and TEE unremarkable. His symptoms resolved and he was discharged with ASA and pravastatin. He has avastin injection for macular degeneration. He also enrolled into a study and had loop recorder placed at office setting as an outpt. He was later put on dural antiplatelet. BP is under control. Loop recorder found afib in 12/2014 and he was put on Xarelto 15mg  due to CrCl 40s. Currently, changed from Xarelto to Eliquis 5mg  bid. Pt has been doing well.   Plan:   - monitor Cre with  Dr. Maudie Mercury and if Cre > 1.5, may consider decrease eliquis dose. - continue eliquis 5mg  twice a day and lipitor for stroke prevention - will request recent lab result from Dr. Maudie Mercury.  - continue zetia and lipitor for HLD - follow up with Cardiology Dr. Tamala Julian.  - Follow up with your primary care physician for stroke risk factor modification. Recommend maintain blood pressure goal around 130-150/80, diabetes with hemoglobin A1c goal below 6.5% and lipids with LDL cholesterol goal below 70 mg/dL.  - check BP at home and BP goal 130-150 due to intracranial vascular stenosis. - follow up as needed  ADDENDUM: requested labs from PCP, Cre was 1.2 on 12/18/15 and was 1.3 on 05/24/16. LDL 39 and 49, respectively. A1C 6.1 and 6.3, respectively. TSH normal.    No orders of the defined types were placed in this encounter.   No orders of the defined types were placed in this encounter.   Patient Instructions   - continney function with Dr. Maudie Mercury and if Cre > 1.5, may consider decrease eliue eliquis 5mg  twice a day and lipitor for stroke prevention - will request recent lab result from Dr. Maudie Mercury or Dr. Alinda Money.  - check kidquis dose to 2.5mg  twice a day - continue zetia and lipitor for HLD - follow up with Cardiology Dr. Tamala Julian.  - Follow up with your primary care physician for stroke risk factor modification. Recommend maintain blood pressure goal around 130-150/80, diabetes with hemoglobin A1c goal below 6.5% and lipids with LDL cholesterol goal below 70 mg/dL.  - check BP at home and BP goal 130-150 due to intracranial vascular stenosis. - follow up as needed   Rosalin Hawking, MD PhD [facility] [address] [place], [place] [address] [phone]

## 2016-11-02 NOTE — Patient Instructions (Addendum)
-   continney function with Dr. Maudie Mercury and if Cre > 1.5, may consider decrease eliue eliquis 5mg  twice a day and lipitor for stroke prevention - will request recent lab result from Dr. Maudie Mercury or Dr. Alinda Money.  - check kidquis dose to 2.5mg  twice a day - continue zetia and lipitor for HLD - follow up with Cardiology Dr. Tamala Julian.  - Follow up with your primary care physician for stroke risk factor modification. Recommend maintain blood pressure goal around 130-150/80, diabetes with hemoglobin A1c goal below 6.5% and lipids with LDL cholesterol goal below 70 mg/dL.  - check BP at home and BP goal 130-150 due to intracranial vascular stenosis. - follow up as needed

## 2016-11-02 NOTE — Progress Notes (Signed)
Carelink Summary Report / Loop Recorder 

## 2016-11-14 LAB — CUP PACEART REMOTE DEVICE CHECK
Implantable Pulse Generator Implant Date: 20160104
MDC IDC SESS DTM: 20180723121550

## 2016-11-14 NOTE — Progress Notes (Signed)
Carelink summary report received. Battery status OK. Normal device function. No new symptom episodes, tachy episodes, brady, or pause episodes. No new AF episodes. Monthly summary reports and ROV/PRN 

## 2016-11-16 ENCOUNTER — Ambulatory Visit (INDEPENDENT_AMBULATORY_CARE_PROVIDER_SITE_OTHER): Payer: Medicare Other | Admitting: Interventional Cardiology

## 2016-11-16 ENCOUNTER — Encounter: Payer: Self-pay | Admitting: Interventional Cardiology

## 2016-11-16 VITALS — BP 140/56 | HR 59 | Ht 71.0 in | Wt 188.4 lb

## 2016-11-16 DIAGNOSIS — I63412 Cerebral infarction due to embolism of left middle cerebral artery: Secondary | ICD-10-CM | POA: Diagnosis not present

## 2016-11-16 DIAGNOSIS — Z7901 Long term (current) use of anticoagulants: Secondary | ICD-10-CM | POA: Diagnosis not present

## 2016-11-16 DIAGNOSIS — E784 Other hyperlipidemia: Secondary | ICD-10-CM | POA: Diagnosis not present

## 2016-11-16 DIAGNOSIS — I480 Paroxysmal atrial fibrillation: Secondary | ICD-10-CM

## 2016-11-16 DIAGNOSIS — I25708 Atherosclerosis of coronary artery bypass graft(s), unspecified, with other forms of angina pectoris: Secondary | ICD-10-CM | POA: Diagnosis not present

## 2016-11-16 DIAGNOSIS — E7849 Other hyperlipidemia: Secondary | ICD-10-CM

## 2016-11-16 DIAGNOSIS — I209 Angina pectoris, unspecified: Secondary | ICD-10-CM

## 2016-11-16 DIAGNOSIS — I4510 Unspecified right bundle-branch block: Secondary | ICD-10-CM

## 2016-11-16 DIAGNOSIS — I10 Essential (primary) hypertension: Secondary | ICD-10-CM | POA: Diagnosis not present

## 2016-11-16 MED ORDER — PANTOPRAZOLE SODIUM 40 MG PO TBEC: 40.0000 mg | DELAYED_RELEASE_TABLET | Freq: Every day | ORAL | 0 refills | Status: DC

## 2016-11-16 NOTE — Progress Notes (Signed)
Cardiology Office Note    Date:  11/16/2016   ID:  [name], DOB 08-May-1922, MRN [id number]  PCP:  Jani Gravel, MD  Cardiologist: Sinclair Grooms, MD   Chief Complaint  Patient presents with  . Coronary Artery Disease    History of Present Illness:  [name] is a 83 y.o. male  follow-up of coronary artery disease with bypass grafting in 2000, essential hypertension, hyperlipidemia, paroxysmal atrial fibrillation, and known right bundle branch block. 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  The patient is still upset/angry concerning the last 4 months of his wife's death. She was in a nursing home. She had recurrent urinary tract infections. She finally had a cardiac arrest and was temporarily on life support. She died in hospice. She had multiple falls with pelvic fracture.  Despite the stress of the last 6 months, he has not had any significant episodes of chest pain. He had one episode of discomfort that occurred when he was bending over to lift something. The discomfort lasted a very short period of time and in retrospect he feels it was likely related to the position that he was in.   Past Medical History:  Diagnosis Date  . BPH (benign prostatic hyperplasia)   . Coronary atherosclerosis of artery bypass graft 12/20/2013   CASHD with CABG 2000 with LIMA to LAD, SV graft to diagonal, SVG to OM, and saphenous vein graft to distal RCA.     . Essential hypertension 12/20/2013  . GERD (gastroesophageal reflux disease)   . Hyperlipidemia 12/20/2013  . Paroxysmal atrial fibrillation (HCC)   . Right bundle branch block 12/20/2013  . Stroke [facility])     Past Surgical History:  Procedure Laterality Date  . CARDIAC SURGERY  2000   BYPASS  . CARDIAC SURGERY     quadruple by pass surgery  . HERNIA REPAIR  2005  . LOOP RECORDER IMPLANT    . PROSTATE SURGERY  1985  . TEE WITHOUT CARDIOVERSION N/A 04/02/2014   Procedure: TRANSESOPHAGEAL ECHOCARDIOGRAM (TEE);  Surgeon: Lelon Perla, MD;  Location: [facility] ENDOSCOPY;  Service: Cardiovascular;  Laterality: N/A;    Current Medications: Outpatient Medications Prior to Visit  Medication Sig Dispense Refill  . apixaban (ELIQUIS) 5 MG TABS tablet Take 1 tablet (5 mg total) by mouth 2 (two) times daily. 60 tablet 6  . atenolol (TENORMIN) 25 MG tablet Take 12.5 mg by mouth daily at 12 noon.     . ezetimibe (ZETIA) 10 MG tablet Take 5 mg by mouth at bedtime.    . hydrochlorothiazide (HYDRODIURIL) 25 MG tablet Take 12.5 mg by mouth every other day.    . lisinopril (PRINIVIL,ZESTRIL) 40 MG tablet Take 1 tablet (40 mg total) by mouth daily.    . Multiple Vitamin (MULTIVITAMIN WITH MINERALS) TABS tablet Take 1 tablet by mouth daily.    . Multiple Vitamins-Minerals (PRESERVISION AREDS 2 PO) Take 1 capsule by mouth 2 (two) times daily.     . traMADol (ULTRAM) 50 MG tablet Take 50 mg by mouth every 6 (six) hours as needed for moderate pain.   0  . amLODipine (NORVASC) 5 MG tablet Take 1 tablet (5 mg total) by mouth daily. (Patient not taking: Reported on 11/16/2016)    . atorvastatin (LIPITOR) 40 MG tablet Take 1 tablet (40 mg total) by mouth daily. (Patient not taking: Reported on 11/16/2016) 90 tablet 3  . finasteride (PROSCAR) 5 MG tablet Take 0.5 tablets (2.5 mg total) by  mouth daily. (Patient not taking: Reported on 11/16/2016)    . metFORMIN (GLUCOPHAGE) 500 MG tablet Take 0.5 tablets (250 mg total) by mouth daily with breakfast. (Patient not taking: Reported on 11/16/2016)    . pantoprazole (PROTONIX) 40 MG tablet Take 40 mg by mouth daily.     No facility-administered medications prior to visit.      Allergies:   Monosodium glutamate   Social History   Social History  . Marital status: Married    Spouse name: N/A  . Number of children: 3  . Years of education: BSEE   Social History Main Topics  . Smoking status: Never Smoker  . Smokeless tobacco: Never Used  . Alcohol use 0.6 oz/week    1 Glasses of wine per week      Comment: SOCIAL  . Drug use: No  . Sexual activity: Not Currently   Other Topics Concern  . None   Social History Narrative   Patient is married with 3 children.   Patient is right handed.   Patient has his BSEE degree.   Patient drinks 2 cups daily.     Family History:  The patient's family history includes Hypertension in his mother.   ROS:   Please see the history of present illness.    Vision disturbance, blood in urine, leading to evaluation by Dr. Alinda Money. Prior history of embolic CVA. Loop recorder has not identified any arrhythmia that was support a cardiogenic embolic source. He is overall doing well with good appetite but not sleeping well related to depression.  All other systems reviewed and are negative.   PHYSICAL EXAM:   VS:  BP (!) 140/56 (BP Location: Right Arm)   Pulse (!) 59   Ht 5\' 11"  (1.803 m)   Wt 188 lb 6.4 oz (85.5 kg)   BMI 26.28 kg/m    GEN: Well nourished, well developed, in no acute distress  HEENT: normal  Neck: no JVD, carotid bruits, or masses Cardiac: RRR; no murmurs, rubs, or gallops,no edema  Respiratory:  clear to auscultation bilaterally, normal work of breathing GI: soft, nontender, nondistended, + BS MS: no deformity or atrophy  Skin: warm and dry, no rash Neuro:  Alert and Oriented x 3, Strength and sensation are intact Psych: euthymic mood, full affect  Wt Readings from Last 3 Encounters:  11/16/16 188 lb 6.4 oz (85.5 kg)  11/02/16 185 lb 3.2 oz (84 kg)  08/06/16 184 lb (83.5 kg)      Studies/Labs Reviewed:   EKG:  EKG  None  Recent Labs: No results found for requested labs within last 8760 hours.   Lipid Panel    Component Value Date/Time   CHOL 81 12/05/2014 0427   TRIG 83 12/05/2014 0427   HDL 29 (L) 12/05/2014 0427   CHOLHDL 2.8 12/05/2014 0427   VLDL 17 12/05/2014 0427   LDLCALC 35 12/05/2014 0427    Additional studies/ records that were reviewed today include:  No new data.    ASSESSMENT:    1.  Atherosclerosis of coronary artery bypass graft of native heart with stable angina pectoris ([place])   2. Essential hypertension   3. Paroxysmal atrial fibrillation (HCC)   4. Right bundle branch block   5. Cerebral infarction due to embolism of left middle cerebral artery (HCC)   6. Other hyperlipidemia   7. Chronic anticoagulation      PLAN:  In order of problems listed above:  1. Stable without angina. Notify us if exertional  chest discomfort or spontaneous discomfort of any prolonged nature requiring nitroglycerin. 2. Blood pressure is under excellent control for his age. No change in therapy recommended. 3. With history of embolic CVA, we will continue anticoagulation therapy even though atrial fibrillation/flutter has not been identified on loop recorder.. 4. Addressed 5. Continue anticoagulation. 6. LDL cholesterol was 49 when evaluated in February 2018. 7. Continue Eliquis.  Currently doing well at age 78. Do not plan imaging or functional studies as long as he is able to ambulate and care for himself. May discuss his desires with reference to cardiac resuscitation and coast status on her next visit. Did not fill appropriate today given the recent death of his wife.  Prolonged office visit with greater than 50% of the time spent in counseling concerning grief associated with the recent death of his wife.  Medication Adjustments/Labs and Tests Ordered: Current medicines are reviewed at length with the patient today.  Concerns regarding medicines are outlined above.  Medication changes, Labs and Tests ordered today are listed in the Patient Instructions below. There are no Patient Instructions on file for this visit.   Signed, Sinclair Grooms, MD  11/16/2016 2:54 PM    [facility] [address], [place], [place]  [address] Phone: [phone]; Fax: [phone]

## 2016-11-16 NOTE — Patient Instructions (Signed)

## 2016-11-18 DIAGNOSIS — E785 Hyperlipidemia, unspecified: Secondary | ICD-10-CM | POA: Diagnosis not present

## 2016-11-18 DIAGNOSIS — I10 Essential (primary) hypertension: Secondary | ICD-10-CM | POA: Diagnosis not present

## 2016-11-23 DIAGNOSIS — N400 Enlarged prostate without lower urinary tract symptoms: Secondary | ICD-10-CM | POA: Diagnosis not present

## 2016-11-23 DIAGNOSIS — I639 Cerebral infarction, unspecified: Secondary | ICD-10-CM | POA: Diagnosis not present

## 2016-11-23 DIAGNOSIS — E785 Hyperlipidemia, unspecified: Secondary | ICD-10-CM | POA: Diagnosis not present

## 2016-11-23 DIAGNOSIS — I10 Essential (primary) hypertension: Secondary | ICD-10-CM | POA: Diagnosis not present

## 2016-11-24 DIAGNOSIS — H353112 Nonexudative age-related macular degeneration, right eye, intermediate dry stage: Secondary | ICD-10-CM | POA: Diagnosis not present

## 2016-11-24 DIAGNOSIS — H35373 Puckering of macula, bilateral: Secondary | ICD-10-CM | POA: Diagnosis not present

## 2016-11-24 DIAGNOSIS — H353221 Exudative age-related macular degeneration, left eye, with active choroidal neovascularization: Secondary | ICD-10-CM | POA: Diagnosis not present

## 2016-11-24 DIAGNOSIS — H43812 Vitreous degeneration, left eye: Secondary | ICD-10-CM | POA: Diagnosis not present

## 2016-12-01 ENCOUNTER — Ambulatory Visit (INDEPENDENT_AMBULATORY_CARE_PROVIDER_SITE_OTHER): Payer: Medicare Other | Admitting: *Deleted

## 2016-12-01 DIAGNOSIS — I639 Cerebral infarction, unspecified: Secondary | ICD-10-CM

## 2016-12-01 NOTE — Progress Notes (Signed)
Carelink Summary Report / Loop Recorder 

## 2016-12-07 ENCOUNTER — Telehealth: Payer: Self-pay | Admitting: Interventional Cardiology

## 2016-12-07 LAB — CUP PACEART REMOTE DEVICE CHECK
Implantable Pulse Generator Implant Date: 20160104
MDC IDC SESS DTM: 20180822121209

## 2016-12-07 NOTE — Telephone Encounter (Signed)
Per pt call pt has seen Dr Yancey Flemings went over medications and  Has some suggestions.    Please give pt a call back.

## 2016-12-07 NOTE — Telephone Encounter (Signed)
Pt seen by Dr. Milinda Pointer at [facility] this past Friday.  BP was 190/70, then upon recheck was 170/70.  Pt states Dr. Milinda Pointer told pt to contact our office to see if Dr. Tamala Julian felt it would be appropriate to increase Amlodipine.  Advised pt I would send message to Dr. Tamala Julian for review and advisement.

## 2016-12-08 NOTE — Telephone Encounter (Signed)
If blood pressure remains above 150/90 mmHg, we will need adjustment in therapy. Asked patient to be careful with salt in his diet and record his blood pressure daily at least 2 hours after his morning medication has been taken.

## 2016-12-08 NOTE — Telephone Encounter (Signed)
Spoke with pt and went over recommendations per Dr. Smith.  Pt verbalized understanding and was in agreement with this plan.   

## 2016-12-22 ENCOUNTER — Telehealth: Payer: Self-pay | Admitting: Interventional Cardiology

## 2016-12-22 NOTE — Telephone Encounter (Signed)
Will forward these readings to Dr Tamala Julian for review./cy

## 2016-12-22 NOTE — Telephone Encounter (Signed)
New message     Pt c/o BP issue: STAT if pt c/o blurred vision, one-sided weakness or slurred speech  1. What are your last 5 BP readings?  8/29 150/74 8/30 150/77  8/31 139/69 9/1 129/48 9/2 122/44 9/3/128/63  9/8 160/76 9/11 134/69   He was to report a weeks worth of stats to Dr Tamala Julian and see if he wants to change medication

## 2016-12-23 NOTE — Telephone Encounter (Signed)
Increase HCTZ to 12.5 mg Monday, Wednesday, Friday, and Saturday.

## 2016-12-27 NOTE — Telephone Encounter (Signed)
Lm to call back ./cy 

## 2016-12-27 NOTE — Telephone Encounter (Signed)
Pt aware of recommendations ./cy 

## 2016-12-28 DIAGNOSIS — H353221 Exudative age-related macular degeneration, left eye, with active choroidal neovascularization: Secondary | ICD-10-CM | POA: Diagnosis not present

## 2016-12-31 ENCOUNTER — Ambulatory Visit (INDEPENDENT_AMBULATORY_CARE_PROVIDER_SITE_OTHER): Payer: Medicare Other | Admitting: *Deleted

## 2016-12-31 DIAGNOSIS — I639 Cerebral infarction, unspecified: Secondary | ICD-10-CM

## 2016-12-31 NOTE — Progress Notes (Signed)
Carelink Summary Report / Loop Recorder 

## 2017-01-03 LAB — CUP PACEART REMOTE DEVICE CHECK
Date Time Interrogation Session: 20180921123827
Implantable Pulse Generator Implant Date: 20160104

## 2017-01-19 DIAGNOSIS — Z23 Encounter for immunization: Secondary | ICD-10-CM | POA: Diagnosis not present

## 2017-01-31 ENCOUNTER — Ambulatory Visit (INDEPENDENT_AMBULATORY_CARE_PROVIDER_SITE_OTHER): Payer: Medicare Other | Admitting: *Deleted

## 2017-01-31 DIAGNOSIS — I639 Cerebral infarction, unspecified: Secondary | ICD-10-CM

## 2017-01-31 LAB — CUP PACEART REMOTE DEVICE CHECK
Implantable Pulse Generator Implant Date: 20160104
MDC IDC SESS DTM: 20181021123925

## 2017-01-31 NOTE — Progress Notes (Signed)
Carelink Summary Report / Loop Recorder 

## 2017-02-01 DIAGNOSIS — Z85828 Personal history of other malignant neoplasm of skin: Secondary | ICD-10-CM | POA: Diagnosis not present

## 2017-02-01 DIAGNOSIS — D1801 Hemangioma of skin and subcutaneous tissue: Secondary | ICD-10-CM | POA: Diagnosis not present

## 2017-02-01 DIAGNOSIS — L570 Actinic keratosis: Secondary | ICD-10-CM | POA: Diagnosis not present

## 2017-02-01 DIAGNOSIS — L821 Other seborrheic keratosis: Secondary | ICD-10-CM | POA: Diagnosis not present

## 2017-02-02 DIAGNOSIS — H353221 Exudative age-related macular degeneration, left eye, with active choroidal neovascularization: Secondary | ICD-10-CM | POA: Diagnosis not present

## 2017-02-03 DIAGNOSIS — H40051 Ocular hypertension, right eye: Secondary | ICD-10-CM | POA: Diagnosis not present

## 2017-02-03 DIAGNOSIS — H524 Presbyopia: Secondary | ICD-10-CM | POA: Diagnosis not present

## 2017-02-03 DIAGNOSIS — Z961 Presence of intraocular lens: Secondary | ICD-10-CM | POA: Diagnosis not present

## 2017-02-03 DIAGNOSIS — H353221 Exudative age-related macular degeneration, left eye, with active choroidal neovascularization: Secondary | ICD-10-CM | POA: Diagnosis not present

## 2017-03-01 ENCOUNTER — Ambulatory Visit (INDEPENDENT_AMBULATORY_CARE_PROVIDER_SITE_OTHER): Payer: Medicare Other | Admitting: *Deleted

## 2017-03-01 DIAGNOSIS — I639 Cerebral infarction, unspecified: Secondary | ICD-10-CM

## 2017-03-02 NOTE — Progress Notes (Signed)
Carelink Summary Report / Loop Recorder 

## 2017-03-10 DIAGNOSIS — H353221 Exudative age-related macular degeneration, left eye, with active choroidal neovascularization: Secondary | ICD-10-CM | POA: Diagnosis not present

## 2017-03-17 LAB — CUP PACEART REMOTE DEVICE CHECK
Date Time Interrogation Session: 20181120133707
MDC IDC PG IMPLANT DT: 20160104

## 2017-03-31 ENCOUNTER — Ambulatory Visit (INDEPENDENT_AMBULATORY_CARE_PROVIDER_SITE_OTHER): Payer: Medicare Other | Admitting: *Deleted

## 2017-03-31 DIAGNOSIS — I639 Cerebral infarction, unspecified: Secondary | ICD-10-CM | POA: Diagnosis not present

## 2017-03-31 NOTE — Progress Notes (Signed)
Carelink Summary Report / Loop Recorder 

## 2017-04-14 DIAGNOSIS — H353221 Exudative age-related macular degeneration, left eye, with active choroidal neovascularization: Secondary | ICD-10-CM | POA: Diagnosis not present

## 2017-04-14 LAB — CUP PACEART REMOTE DEVICE CHECK
Date Time Interrogation Session: 20181220151157
MDC IDC PG IMPLANT DT: 20160104

## 2017-04-26 ENCOUNTER — Telehealth: Payer: Self-pay | Admitting: *Deleted

## 2017-04-26 NOTE — Telephone Encounter (Signed)
LMOVM requesting call back to the [facility].  Gave direct number for return call.  Patient had an ~6sec pause noted on his LINQ on 04/22/17 at [place], transmitted via [place] on 04/26/17.  Presenting rhythm from 1/15 at 0204 is sinus brady at 40bpm.  Will determine if patient symptomatic with episode.

## 2017-04-26 NOTE — Telephone Encounter (Signed)
Spoke with patient.  He reports he was asymptomatic with episode, likely asleep at the time as he does not typically wake up until 0830 or later.  Patient agrees to call back if he experiences any presyncopal or syncopal episodes.  Patient's LINQ is likely nearing RRT as it has been in place since 04/15/14.  Discussed various options with patient and he is not leaning towards explant at this time, but may want an appointment with Dr. Rayann Heman before making a final decision.  He agrees to let us know his wishes when we call him back regarding RRT in the next few months.  Patient is appreciative and denies additional questions or concerns at this time.

## 2017-05-02 ENCOUNTER — Ambulatory Visit (INDEPENDENT_AMBULATORY_CARE_PROVIDER_SITE_OTHER): Payer: Medicare Other | Admitting: *Deleted

## 2017-05-02 DIAGNOSIS — I639 Cerebral infarction, unspecified: Secondary | ICD-10-CM

## 2017-05-02 NOTE — Progress Notes (Signed)
Carelink Summary Report / Loop Recorder 

## 2017-05-04 ENCOUNTER — Other Ambulatory Visit: Payer: Self-pay | Admitting: Internal Medicine

## 2017-05-09 LAB — CUP PACEART REMOTE DEVICE CHECK
Implantable Pulse Generator Implant Date: 20160104
MDC IDC SESS DTM: 20190119154301

## 2017-05-19 DIAGNOSIS — H35373 Puckering of macula, bilateral: Secondary | ICD-10-CM | POA: Diagnosis not present

## 2017-05-19 DIAGNOSIS — H43812 Vitreous degeneration, left eye: Secondary | ICD-10-CM | POA: Diagnosis not present

## 2017-05-19 DIAGNOSIS — H353212 Exudative age-related macular degeneration, right eye, with inactive choroidal neovascularization: Secondary | ICD-10-CM | POA: Diagnosis not present

## 2017-05-19 DIAGNOSIS — H353221 Exudative age-related macular degeneration, left eye, with active choroidal neovascularization: Secondary | ICD-10-CM | POA: Diagnosis not present

## 2017-05-24 DIAGNOSIS — N400 Enlarged prostate without lower urinary tract symptoms: Secondary | ICD-10-CM | POA: Diagnosis not present

## 2017-05-24 DIAGNOSIS — R739 Hyperglycemia, unspecified: Secondary | ICD-10-CM | POA: Diagnosis not present

## 2017-05-24 DIAGNOSIS — I10 Essential (primary) hypertension: Secondary | ICD-10-CM | POA: Diagnosis not present

## 2017-05-30 ENCOUNTER — Ambulatory Visit (INDEPENDENT_AMBULATORY_CARE_PROVIDER_SITE_OTHER): Payer: Medicare Other | Admitting: *Deleted

## 2017-05-30 DIAGNOSIS — I639 Cerebral infarction, unspecified: Secondary | ICD-10-CM

## 2017-05-31 DIAGNOSIS — I2510 Atherosclerotic heart disease of native coronary artery without angina pectoris: Secondary | ICD-10-CM | POA: Diagnosis not present

## 2017-05-31 DIAGNOSIS — Z0000 Encounter for general adult medical examination without abnormal findings: Secondary | ICD-10-CM | POA: Diagnosis not present

## 2017-05-31 DIAGNOSIS — E119 Type 2 diabetes mellitus without complications: Secondary | ICD-10-CM | POA: Diagnosis not present

## 2017-05-31 NOTE — Progress Notes (Signed)
Carelink Summary Report / Loop Recorder 

## 2017-06-16 DIAGNOSIS — H353221 Exudative age-related macular degeneration, left eye, with active choroidal neovascularization: Secondary | ICD-10-CM | POA: Diagnosis not present

## 2017-06-23 DIAGNOSIS — N401 Enlarged prostate with lower urinary tract symptoms: Secondary | ICD-10-CM | POA: Diagnosis not present

## 2017-06-27 LAB — CUP PACEART REMOTE DEVICE CHECK
Implantable Pulse Generator Implant Date: 20160104
MDC IDC SESS DTM: 20190219132249

## 2017-07-01 DIAGNOSIS — N402 Nodular prostate without lower urinary tract symptoms: Secondary | ICD-10-CM | POA: Diagnosis not present

## 2017-07-01 DIAGNOSIS — R972 Elevated prostate specific antigen [PSA]: Secondary | ICD-10-CM | POA: Diagnosis not present

## 2017-07-04 ENCOUNTER — Ambulatory Visit (INDEPENDENT_AMBULATORY_CARE_PROVIDER_SITE_OTHER): Payer: Medicare Other | Admitting: *Deleted

## 2017-07-04 DIAGNOSIS — I639 Cerebral infarction, unspecified: Secondary | ICD-10-CM

## 2017-07-04 NOTE — Progress Notes (Signed)
Carelink Summary Report / Loop Recorder 

## 2017-07-21 DIAGNOSIS — H353221 Exudative age-related macular degeneration, left eye, with active choroidal neovascularization: Secondary | ICD-10-CM | POA: Diagnosis not present

## 2017-08-05 ENCOUNTER — Ambulatory Visit (INDEPENDENT_AMBULATORY_CARE_PROVIDER_SITE_OTHER): Payer: Medicare Other | Admitting: *Deleted

## 2017-08-05 DIAGNOSIS — I639 Cerebral infarction, unspecified: Secondary | ICD-10-CM

## 2017-08-05 NOTE — Progress Notes (Signed)
Carelink Summary Report / Loop Recorder 

## 2017-08-09 ENCOUNTER — Telehealth: Payer: Self-pay | Admitting: *Deleted

## 2017-08-09 NOTE — Telephone Encounter (Signed)
Spoke with patient to make him aware of LINQ at RRT.  Patient declines explant as ILR is not bothering him.  He is aware to unplug home monitor.  Return kit should arrive in 4-6 weeks.  Unenrolled from [place], return kit ordered to home address on file.

## 2017-08-10 LAB — CUP PACEART REMOTE DEVICE CHECK
Date Time Interrogation Session: 20190324141130
Implantable Pulse Generator Implant Date: 20160104

## 2017-08-12 DIAGNOSIS — M79672 Pain in left foot: Secondary | ICD-10-CM | POA: Diagnosis not present

## 2017-08-12 DIAGNOSIS — B351 Tinea unguium: Secondary | ICD-10-CM | POA: Diagnosis not present

## 2017-08-12 DIAGNOSIS — Q6689 Other  specified congenital deformities of feet: Secondary | ICD-10-CM | POA: Diagnosis not present

## 2017-08-12 DIAGNOSIS — M79671 Pain in right foot: Secondary | ICD-10-CM | POA: Diagnosis not present

## 2017-08-18 ENCOUNTER — Other Ambulatory Visit: Payer: Self-pay | Admitting: Internal Medicine

## 2017-08-30 LAB — CUP PACEART REMOTE DEVICE CHECK
Implantable Pulse Generator Implant Date: 20160104
MDC IDC SESS DTM: 20190426140708

## 2017-09-01 DIAGNOSIS — H353221 Exudative age-related macular degeneration, left eye, with active choroidal neovascularization: Secondary | ICD-10-CM | POA: Diagnosis not present

## 2017-09-28 DIAGNOSIS — H353221 Exudative age-related macular degeneration, left eye, with active choroidal neovascularization: Secondary | ICD-10-CM | POA: Diagnosis not present

## 2017-11-03 DIAGNOSIS — H353112 Nonexudative age-related macular degeneration, right eye, intermediate dry stage: Secondary | ICD-10-CM | POA: Diagnosis not present

## 2017-11-03 DIAGNOSIS — H353221 Exudative age-related macular degeneration, left eye, with active choroidal neovascularization: Secondary | ICD-10-CM | POA: Diagnosis not present

## 2017-11-22 DIAGNOSIS — E119 Type 2 diabetes mellitus without complications: Secondary | ICD-10-CM | POA: Diagnosis not present

## 2017-11-22 DIAGNOSIS — E785 Hyperlipidemia, unspecified: Secondary | ICD-10-CM | POA: Diagnosis not present

## 2017-11-22 DIAGNOSIS — I2510 Atherosclerotic heart disease of native coronary artery without angina pectoris: Secondary | ICD-10-CM | POA: Diagnosis not present

## 2017-11-23 ENCOUNTER — Ambulatory Visit (INDEPENDENT_AMBULATORY_CARE_PROVIDER_SITE_OTHER): Payer: Medicare Other | Admitting: Interventional Cardiology

## 2017-11-23 ENCOUNTER — Encounter: Payer: Self-pay | Admitting: Interventional Cardiology

## 2017-11-23 VITALS — BP 140/64 | HR 51 | Ht 71.0 in | Wt 189.8 lb

## 2017-11-23 DIAGNOSIS — I10 Essential (primary) hypertension: Secondary | ICD-10-CM

## 2017-11-23 DIAGNOSIS — I480 Paroxysmal atrial fibrillation: Secondary | ICD-10-CM | POA: Diagnosis not present

## 2017-11-23 DIAGNOSIS — I83891 Varicose veins of right lower extremities with other complications: Secondary | ICD-10-CM | POA: Diagnosis not present

## 2017-11-23 DIAGNOSIS — E7849 Other hyperlipidemia: Secondary | ICD-10-CM | POA: Diagnosis not present

## 2017-11-23 DIAGNOSIS — I25708 Atherosclerosis of coronary artery bypass graft(s), unspecified, with other forms of angina pectoris: Secondary | ICD-10-CM | POA: Diagnosis not present

## 2017-11-23 DIAGNOSIS — I639 Cerebral infarction, unspecified: Secondary | ICD-10-CM | POA: Diagnosis not present

## 2017-11-23 DIAGNOSIS — Z7901 Long term (current) use of anticoagulants: Secondary | ICD-10-CM | POA: Diagnosis not present

## 2017-11-23 NOTE — Progress Notes (Signed)
Cardiology Office Note:    Date:  11/23/2017   ID:  DELVECCHIO MADOLE, DOB 1922/04/20, MRN [id number]  PCP:  Jani Gravel, MD  Cardiologist:  No primary care provider on file.   Referring MD: Jani Gravel, MD   Chief Complaint  Patient presents with  . Coronary Artery Disease  . Atrial Fibrillation    History of Present Illness:    [name] is a 84 y.o. male with a hx of coronary artery disease with bypass grafting in 2000, essential hypertension, hyperlipidemia, paroxysmal atrial fibrillation, and known right bundle branch block. 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  Mark is doing relatively well.  He denies angina.  He has not had lower extremity swelling.  He has not had palpitations or syncope.  Overall he has been feeling well with one exception that his right lower extremity swells below the knee.  It is been this way since bypass surgery but he feels it is more swollen now than previous.  Chronic anticoagulation therapy because of prior atrial fibrillation.  Past Medical History:  Diagnosis Date  . BPH (benign prostatic hyperplasia)   . Coronary atherosclerosis of artery bypass graft 12/20/2013   CASHD with CABG 2000 with LIMA to LAD, SV graft to diagonal, SVG to OM, and saphenous vein graft to distal RCA.     . Essential hypertension 12/20/2013  . GERD (gastroesophageal reflux disease)   . Hyperlipidemia 12/20/2013  . Paroxysmal atrial fibrillation (HCC)   . Right bundle branch block 12/20/2013  . Stroke [facility])     Past Surgical History:  Procedure Laterality Date  . CARDIAC SURGERY  2000   BYPASS  . CARDIAC SURGERY     quadruple by pass surgery  . HERNIA REPAIR  2005  . LOOP RECORDER IMPLANT    . PROSTATE SURGERY  1985  . TEE WITHOUT CARDIOVERSION N/A 04/02/2014   Procedure: TRANSESOPHAGEAL ECHOCARDIOGRAM (TEE);  Surgeon: Lelon Perla, MD;  Location: [facility] ENDOSCOPY;  Service: Cardiovascular;  Laterality: N/A;    Current Medications: Current Meds    Medication Sig  . amLODipine (NORVASC) 5 MG tablet Take 5 mg by mouth at bedtime.  Marland Kitchen apixaban (ELIQUIS) 5 MG TABS tablet Take 1 tablet (5 mg total) by mouth 2 (two) times daily.  Marland Kitchen atenolol (TENORMIN) 25 MG tablet Take 12.5 mg by mouth daily at 12 noon.   Marland Kitchen atorvastatin (LIPITOR) 40 MG tablet Take 40 mg by mouth at bedtime.  Marland Kitchen ezetimibe (ZETIA) 10 MG tablet Take 5 mg by mouth at bedtime.  . finasteride (PROSCAR) 5 MG tablet Take 5 mg by mouth daily at 12 noon.  . hydrochlorothiazide (HYDRODIURIL) 25 MG tablet Take 12.5 mg by mouth every other day.  . lisinopril (PRINIVIL,ZESTRIL) 40 MG tablet Take 1 tablet (40 mg total) by mouth daily.  . metFORMIN (GLUCOPHAGE) 500 MG tablet Take 250 mg by mouth daily.  . Multiple Vitamin (MULTIVITAMIN WITH MINERALS) TABS tablet Take 1 tablet by mouth daily.  . Multiple Vitamins-Minerals (PRESERVISION AREDS 2 PO) Take 1 capsule by mouth 2 (two) times daily.   . pantoprazole (PROTONIX) 20 MG tablet Take 20 mg by mouth daily.  . traMADol (ULTRAM) 50 MG tablet Take 50 mg by mouth every 6 (six) hours as needed for moderate pain.      Allergies:   Monosodium glutamate   Social History   Socioeconomic History  . Marital status: Married    Spouse name: Not on file  . Number of children: 3  .  Years of education: BSEE  . Highest education level: Not on file  Occupational History  . Not on file  Social Needs  . Financial resource strain: Not on file  . Food insecurity:    Worry: Not on file    Inability: Not on file  . Transportation needs:    Medical: Not on file    Non-medical: Not on file  Tobacco Use  . Smoking status: Never Smoker  . Smokeless tobacco: Never Used  Substance and Sexual Activity  . Alcohol use: Yes    Alcohol/week: 1.0 standard drinks    Types: 1 Glasses of wine per week    Comment: SOCIAL  . Drug use: No  . Sexual activity: Not Currently  Lifestyle  . Physical activity:    Days per week: Not on file    Minutes per  session: Not on file  . Stress: Not on file  Relationships  . Social connections:    Talks on phone: Not on file    Gets together: Not on file    Attends religious service: Not on file    Active member of club or organization: Not on file    Attends meetings of clubs or organizations: Not on file    Relationship status: Not on file  Other Topics Concern  . Not on file  Social History Narrative   Patient is married with 3 children.   Patient is right handed.   Patient has his BSEE degree.   Patient drinks 2 cups daily.     Family History: The patient's family history includes Hypertension in his mother.  ROS:   Please see the history of present illness.    Vision disturbance, muscle pain, easy bruising.  All other systems reviewed and are negative.  EKGs/Labs/Other Studies Reviewed:    The following studies were reviewed today: Most recent pacer check from April 2018 normal function with no intercurrent episodes of atrial fib.  EKG:  EKG is  ordered today.  The ekg ordered today demonstrates sinus bradycardia 51 bpm, right bundle branch block, and nonspecific ST abnormality.  When compared to the tracing from 08/07/2016, significant change has occurred.  Recent Labs: No results found for requested labs within last 8760 hours.  Recent Lipid Panel    Component Value Date/Time   CHOL 81 12/05/2014 0427   TRIG 83 12/05/2014 0427   HDL 29 (L) 12/05/2014 0427   CHOLHDL 2.8 12/05/2014 0427   VLDL 17 12/05/2014 0427   LDLCALC 35 12/05/2014 0427    Physical Exam:    VS:  BP 140/64   Pulse (!) 51   Ht 5\' 11"  (1.803 m)   Wt 189 lb 12.8 oz (86.1 kg)   BMI 26.47 kg/m     Wt Readings from Last 3 Encounters:  11/23/17 189 lb 12.8 oz (86.1 kg)  11/16/16 188 lb 6.4 oz (85.5 kg)  11/02/16 185 lb 3.2 oz (84 kg)     GEN:  Well nourished, well developed in no acute distress HEENT: Normal NECK: No JVD. LYMPHATICS: No lymphadenopathy CARDIAC: RRR, 1/6 right upper sternal  border systolic murmur, no gallop, no change in lower extremities with 2-3+ ankle to knee pitting edema and trace to 1+ left lower extremity edema. VASCULAR: 2+ bilateral radial pulses.  No bruits. RESPIRATORY:  Clear to auscultation without rales, wheezing or rhonchi  ABDOMEN: Soft, non-tender, non-distended, No pulsatile mass, MUSCULOSKELETAL: No deformity  SKIN: Warm and dry NEUROLOGIC:  Alert and oriented x 3 PSYCHIATRIC:  Normal affect   ASSESSMENT:    1. Paroxysmal atrial fibrillation (HCC)   2. Atherosclerosis of coronary artery bypass graft of native heart with stable angina pectoris ([place])   3. Essential hypertension   4. Chronic anticoagulation   5. Other hyperlipidemia   6. Varicose veins of right lower extremity with edema    PLAN:    In order of problems listed above:  1. Identified on loop recorder after the patient had an embolic CVA.  Since Linq implantation only one episode of atrial fibrillation was identified.  Continue chronic long-term anticoagulation therapy. 2. Stable from cardiovascular standpoint.  Continue aggressive risk factor modification: LDL less than 70, blood pressure 130/80 mmHg, aerobic activity, and screening/management of glycemic abnormalities. 3. Target 130/80 mmHg.  Continue same therapy. 4. Continue apixaban twice daily.  Look for evidence of bleeding. 5. Continue atorvastatin 40 mg/day.  The most recent calculated LDL was less than 40. 6. Venous insufficiency, multifactorial in etiology related to vein graft harvesting and intrinsic varicosities.  Recommend moderate tension therapeutic knee-high support stocking.  Prescription is given.  Call in 2 weeks with status report.   Clinical follow-up in 1 year  Medication Adjustments/Labs and Tests Ordered: Current medicines are reviewed at length with the patient today.  Concerns regarding medicines are outlined above.  Orders Placed This Encounter  Procedures  . EKG 12-Lead   No orders of the  defined types were placed in this encounter.   Patient Instructions  Medication Instructions:  Your physician recommends that you continue on your current medications as directed. Please refer to the Current Medication list given to you today.  Labwork: None  Testing/Procedures: None  Follow-Up: Your physician wants you to follow-up in: 1 year with Dr. Tamala Julian.  You will receive a reminder letter in the mail two months in advance. If you don't receive a letter, please call our office to schedule the follow-up appointment.   Any Other Special Instructions Will Be Listed Below (If Applicable).  Dr. Tamala Julian recommends that you wear moderate tension knee high compression stockings.     If you need a refill on your cardiac medications before your next appointment, please call your pharmacy.      Signed, Sinclair Grooms, MD  11/23/2017 5:49 PM    [place]

## 2017-11-23 NOTE — Patient Instructions (Addendum)
Medication Instructions:  Your physician recommends that you continue on your current medications as directed. Please refer to the Current Medication list given to you today.  Labwork: None  Testing/Procedures: None  Follow-Up: Your physician wants you to follow-up in: 1 year with Dr. Tamala Julian.  You will receive a reminder letter in the mail two months in advance. If you don't receive a letter, please call our office to schedule the follow-up appointment.   Any Other Special Instructions Will Be Listed Below (If Applicable).  Dr. Tamala Julian recommends that you wear moderate tension knee high compression stockings.     If you need a refill on your cardiac medications before your next appointment, please call your pharmacy.

## 2017-12-01 DIAGNOSIS — H353221 Exudative age-related macular degeneration, left eye, with active choroidal neovascularization: Secondary | ICD-10-CM | POA: Diagnosis not present

## 2017-12-08 ENCOUNTER — Telehealth (HOSPITAL_COMMUNITY): Payer: Self-pay | Admitting: Interventional Cardiology

## 2017-12-08 NOTE — Telephone Encounter (Signed)
Good.

## 2017-12-08 NOTE — Telephone Encounter (Signed)
Will route to Dr. Smith to make him aware. 

## 2017-12-08 NOTE — Telephone Encounter (Signed)
New Message:    Pt said he was told 2 weeks ago to wear Support Stockings. He said Dr Tamala Julian told him to call back in 2 weeks and give him an update. He said the swelling in his right leg is better and he will continue to wear the stocking.

## 2017-12-28 DIAGNOSIS — Z23 Encounter for immunization: Secondary | ICD-10-CM | POA: Diagnosis not present

## 2018-01-04 DIAGNOSIS — I2510 Atherosclerotic heart disease of native coronary artery without angina pectoris: Secondary | ICD-10-CM | POA: Diagnosis not present

## 2018-01-04 DIAGNOSIS — I639 Cerebral infarction, unspecified: Secondary | ICD-10-CM | POA: Diagnosis not present

## 2018-01-04 DIAGNOSIS — E785 Hyperlipidemia, unspecified: Secondary | ICD-10-CM | POA: Diagnosis not present

## 2018-01-04 DIAGNOSIS — E119 Type 2 diabetes mellitus without complications: Secondary | ICD-10-CM | POA: Diagnosis not present

## 2018-01-04 DIAGNOSIS — I10 Essential (primary) hypertension: Secondary | ICD-10-CM | POA: Diagnosis not present

## 2018-01-05 DIAGNOSIS — H353221 Exudative age-related macular degeneration, left eye, with active choroidal neovascularization: Secondary | ICD-10-CM | POA: Diagnosis not present

## 2018-01-11 DIAGNOSIS — H40051 Ocular hypertension, right eye: Secondary | ICD-10-CM | POA: Diagnosis not present

## 2018-01-11 DIAGNOSIS — H353221 Exudative age-related macular degeneration, left eye, with active choroidal neovascularization: Secondary | ICD-10-CM | POA: Diagnosis not present

## 2018-02-07 DIAGNOSIS — L570 Actinic keratosis: Secondary | ICD-10-CM | POA: Diagnosis not present

## 2018-02-07 DIAGNOSIS — C44319 Basal cell carcinoma of skin of other parts of face: Secondary | ICD-10-CM | POA: Diagnosis not present

## 2018-02-07 DIAGNOSIS — D485 Neoplasm of uncertain behavior of skin: Secondary | ICD-10-CM | POA: Diagnosis not present

## 2018-02-07 DIAGNOSIS — D225 Melanocytic nevi of trunk: Secondary | ICD-10-CM | POA: Diagnosis not present

## 2018-02-07 DIAGNOSIS — L218 Other seborrheic dermatitis: Secondary | ICD-10-CM | POA: Diagnosis not present

## 2018-02-07 DIAGNOSIS — Z85828 Personal history of other malignant neoplasm of skin: Secondary | ICD-10-CM | POA: Diagnosis not present

## 2018-02-07 DIAGNOSIS — L821 Other seborrheic keratosis: Secondary | ICD-10-CM | POA: Diagnosis not present

## 2018-02-07 DIAGNOSIS — D2261 Melanocytic nevi of right upper limb, including shoulder: Secondary | ICD-10-CM | POA: Diagnosis not present

## 2018-02-09 DIAGNOSIS — H353221 Exudative age-related macular degeneration, left eye, with active choroidal neovascularization: Secondary | ICD-10-CM | POA: Diagnosis not present

## 2018-03-01 DIAGNOSIS — R0609 Other forms of dyspnea: Secondary | ICD-10-CM | POA: Diagnosis not present

## 2018-03-01 DIAGNOSIS — R05 Cough: Secondary | ICD-10-CM | POA: Diagnosis not present

## 2018-03-01 DIAGNOSIS — R0981 Nasal congestion: Secondary | ICD-10-CM | POA: Diagnosis not present

## 2018-03-16 DIAGNOSIS — H43812 Vitreous degeneration, left eye: Secondary | ICD-10-CM | POA: Diagnosis not present

## 2018-03-16 DIAGNOSIS — H353221 Exudative age-related macular degeneration, left eye, with active choroidal neovascularization: Secondary | ICD-10-CM | POA: Diagnosis not present

## 2018-03-16 DIAGNOSIS — H35373 Puckering of macula, bilateral: Secondary | ICD-10-CM | POA: Diagnosis not present

## 2018-03-29 DIAGNOSIS — R05 Cough: Secondary | ICD-10-CM | POA: Diagnosis not present

## 2018-04-07 DIAGNOSIS — I10 Essential (primary) hypertension: Secondary | ICD-10-CM | POA: Diagnosis not present

## 2018-04-07 DIAGNOSIS — E785 Hyperlipidemia, unspecified: Secondary | ICD-10-CM | POA: Diagnosis not present

## 2018-04-07 DIAGNOSIS — E119 Type 2 diabetes mellitus without complications: Secondary | ICD-10-CM | POA: Diagnosis not present

## 2018-04-07 DIAGNOSIS — I639 Cerebral infarction, unspecified: Secondary | ICD-10-CM | POA: Diagnosis not present

## 2018-04-07 DIAGNOSIS — I2510 Atherosclerotic heart disease of native coronary artery without angina pectoris: Secondary | ICD-10-CM | POA: Diagnosis not present

## 2018-04-07 DIAGNOSIS — I480 Paroxysmal atrial fibrillation: Secondary | ICD-10-CM | POA: Diagnosis not present

## 2018-04-17 DIAGNOSIS — I10 Essential (primary) hypertension: Secondary | ICD-10-CM | POA: Diagnosis not present

## 2018-04-24 DIAGNOSIS — H35373 Puckering of macula, bilateral: Secondary | ICD-10-CM | POA: Diagnosis not present

## 2018-04-24 DIAGNOSIS — H353221 Exudative age-related macular degeneration, left eye, with active choroidal neovascularization: Secondary | ICD-10-CM | POA: Diagnosis not present

## 2018-04-24 DIAGNOSIS — H35052 Retinal neovascularization, unspecified, left eye: Secondary | ICD-10-CM | POA: Diagnosis not present

## 2018-04-24 DIAGNOSIS — H35363 Drusen (degenerative) of macula, bilateral: Secondary | ICD-10-CM | POA: Diagnosis not present

## 2018-04-24 DIAGNOSIS — H353112 Nonexudative age-related macular degeneration, right eye, intermediate dry stage: Secondary | ICD-10-CM | POA: Diagnosis not present

## 2018-04-24 DIAGNOSIS — H35453 Secondary pigmentary degeneration, bilateral: Secondary | ICD-10-CM | POA: Diagnosis not present

## 2018-05-01 DIAGNOSIS — I10 Essential (primary) hypertension: Secondary | ICD-10-CM | POA: Diagnosis not present

## 2018-05-10 DIAGNOSIS — H353221 Exudative age-related macular degeneration, left eye, with active choroidal neovascularization: Secondary | ICD-10-CM | POA: Diagnosis not present

## 2018-05-29 DIAGNOSIS — H353221 Exudative age-related macular degeneration, left eye, with active choroidal neovascularization: Secondary | ICD-10-CM | POA: Diagnosis not present

## 2018-06-12 DIAGNOSIS — I10 Essential (primary) hypertension: Secondary | ICD-10-CM | POA: Diagnosis not present

## 2018-06-28 DIAGNOSIS — N402 Nodular prostate without lower urinary tract symptoms: Secondary | ICD-10-CM | POA: Diagnosis not present

## 2018-07-03 DIAGNOSIS — H353221 Exudative age-related macular degeneration, left eye, with active choroidal neovascularization: Secondary | ICD-10-CM | POA: Diagnosis not present

## 2018-07-05 DIAGNOSIS — R310 Gross hematuria: Secondary | ICD-10-CM | POA: Diagnosis not present

## 2018-07-05 DIAGNOSIS — R972 Elevated prostate specific antigen [PSA]: Secondary | ICD-10-CM | POA: Diagnosis not present

## 2018-07-05 DIAGNOSIS — R3912 Poor urinary stream: Secondary | ICD-10-CM | POA: Diagnosis not present

## 2018-07-05 DIAGNOSIS — N401 Enlarged prostate with lower urinary tract symptoms: Secondary | ICD-10-CM | POA: Diagnosis not present

## 2018-07-06 DIAGNOSIS — B0229 Other postherpetic nervous system involvement: Secondary | ICD-10-CM | POA: Diagnosis not present

## 2018-07-06 DIAGNOSIS — I2510 Atherosclerotic heart disease of native coronary artery without angina pectoris: Secondary | ICD-10-CM | POA: Diagnosis not present

## 2018-07-06 DIAGNOSIS — Z0000 Encounter for general adult medical examination without abnormal findings: Secondary | ICD-10-CM | POA: Diagnosis not present

## 2018-07-06 DIAGNOSIS — E785 Hyperlipidemia, unspecified: Secondary | ICD-10-CM | POA: Diagnosis not present

## 2018-07-06 DIAGNOSIS — I10 Essential (primary) hypertension: Secondary | ICD-10-CM | POA: Diagnosis not present

## 2018-07-06 DIAGNOSIS — D692 Other nonthrombocytopenic purpura: Secondary | ICD-10-CM | POA: Diagnosis not present

## 2018-07-06 DIAGNOSIS — I480 Paroxysmal atrial fibrillation: Secondary | ICD-10-CM | POA: Diagnosis not present

## 2018-07-06 DIAGNOSIS — E119 Type 2 diabetes mellitus without complications: Secondary | ICD-10-CM | POA: Diagnosis not present

## 2018-07-06 DIAGNOSIS — Z125 Encounter for screening for malignant neoplasm of prostate: Secondary | ICD-10-CM | POA: Diagnosis not present

## 2018-08-15 DIAGNOSIS — H353221 Exudative age-related macular degeneration, left eye, with active choroidal neovascularization: Secondary | ICD-10-CM | POA: Diagnosis not present

## 2018-09-25 DIAGNOSIS — H353221 Exudative age-related macular degeneration, left eye, with active choroidal neovascularization: Secondary | ICD-10-CM | POA: Diagnosis not present

## 2018-10-31 DIAGNOSIS — R310 Gross hematuria: Secondary | ICD-10-CM | POA: Diagnosis not present

## 2018-10-31 DIAGNOSIS — R3914 Feeling of incomplete bladder emptying: Secondary | ICD-10-CM | POA: Diagnosis not present

## 2018-11-02 DIAGNOSIS — R310 Gross hematuria: Secondary | ICD-10-CM | POA: Diagnosis not present

## 2018-11-03 DIAGNOSIS — R8279 Other abnormal findings on microbiological examination of urine: Secondary | ICD-10-CM | POA: Diagnosis not present

## 2018-11-03 DIAGNOSIS — R310 Gross hematuria: Secondary | ICD-10-CM | POA: Diagnosis not present

## 2018-11-06 DIAGNOSIS — H353221 Exudative age-related macular degeneration, left eye, with active choroidal neovascularization: Secondary | ICD-10-CM | POA: Diagnosis not present

## 2018-11-08 ENCOUNTER — Other Ambulatory Visit: Payer: Self-pay

## 2018-12-05 DIAGNOSIS — R310 Gross hematuria: Secondary | ICD-10-CM | POA: Diagnosis not present

## 2018-12-05 DIAGNOSIS — R3914 Feeling of incomplete bladder emptying: Secondary | ICD-10-CM | POA: Diagnosis not present

## 2018-12-13 DIAGNOSIS — H353221 Exudative age-related macular degeneration, left eye, with active choroidal neovascularization: Secondary | ICD-10-CM | POA: Diagnosis not present

## 2019-01-01 DIAGNOSIS — R634 Abnormal weight loss: Secondary | ICD-10-CM | POA: Diagnosis not present

## 2019-01-01 DIAGNOSIS — E119 Type 2 diabetes mellitus without complications: Secondary | ICD-10-CM | POA: Diagnosis not present

## 2019-01-01 DIAGNOSIS — I639 Cerebral infarction, unspecified: Secondary | ICD-10-CM | POA: Diagnosis not present

## 2019-01-01 DIAGNOSIS — I110 Hypertensive heart disease with heart failure: Secondary | ICD-10-CM | POA: Diagnosis not present

## 2019-01-01 DIAGNOSIS — Z125 Encounter for screening for malignant neoplasm of prostate: Secondary | ICD-10-CM | POA: Diagnosis not present

## 2019-01-01 DIAGNOSIS — I10 Essential (primary) hypertension: Secondary | ICD-10-CM | POA: Diagnosis not present

## 2019-01-01 DIAGNOSIS — E785 Hyperlipidemia, unspecified: Secondary | ICD-10-CM | POA: Diagnosis not present

## 2019-01-01 DIAGNOSIS — Z79899 Other long term (current) drug therapy: Secondary | ICD-10-CM | POA: Diagnosis not present

## 2019-01-01 DIAGNOSIS — D638 Anemia in other chronic diseases classified elsewhere: Secondary | ICD-10-CM | POA: Diagnosis not present

## 2019-01-08 DIAGNOSIS — J90 Pleural effusion, not elsewhere classified: Secondary | ICD-10-CM | POA: Diagnosis not present

## 2019-01-08 DIAGNOSIS — D509 Iron deficiency anemia, unspecified: Secondary | ICD-10-CM | POA: Diagnosis not present

## 2019-01-08 DIAGNOSIS — Z23 Encounter for immunization: Secondary | ICD-10-CM | POA: Diagnosis not present

## 2019-01-08 DIAGNOSIS — I10 Essential (primary) hypertension: Secondary | ICD-10-CM | POA: Diagnosis not present

## 2019-01-08 DIAGNOSIS — E119 Type 2 diabetes mellitus without complications: Secondary | ICD-10-CM | POA: Diagnosis not present

## 2019-01-08 DIAGNOSIS — R634 Abnormal weight loss: Secondary | ICD-10-CM | POA: Diagnosis not present

## 2019-01-08 DIAGNOSIS — I480 Paroxysmal atrial fibrillation: Secondary | ICD-10-CM | POA: Diagnosis not present

## 2019-01-08 DIAGNOSIS — R05 Cough: Secondary | ICD-10-CM | POA: Diagnosis not present

## 2019-01-11 ENCOUNTER — Other Ambulatory Visit: Payer: Self-pay | Admitting: Internal Medicine

## 2019-01-11 DIAGNOSIS — R634 Abnormal weight loss: Secondary | ICD-10-CM

## 2019-01-15 DIAGNOSIS — H353221 Exudative age-related macular degeneration, left eye, with active choroidal neovascularization: Secondary | ICD-10-CM | POA: Diagnosis not present

## 2019-01-16 NOTE — Progress Notes (Signed)
Cardiology Office Note:    Date:  01/17/2019   ID:  [name], DOB 24-Mar-1923, MRN [id number]  PCP:  Jani Gravel, MD  Cardiologist:  No primary care provider on file.   Referring MD: Jani Gravel, MD   Chief Complaint  Patient presents with  . Coronary Artery Disease    History of Present Illness:    [name] is a 85 y.o. male with a hx of coronary artery disease with bypass grafting in 2000, essential hypertension, hyperlipidemia, paroxysmal atrial fibrillation, and known right bundle branch block, and history of embolic CVA.  [name] is doing well.  He is in assisted living.  He is coping with the loss of his wife over a year ago.  He denies chest pain, orthopnea, PND, and peripheral edema.  He has not had syncope or near syncope.  Past Medical History:  Diagnosis Date  . BPH (benign prostatic hyperplasia)   . Coronary atherosclerosis of artery bypass graft 12/20/2013   CASHD with CABG 2000 with LIMA to LAD, SV graft to diagonal, SVG to OM, and saphenous vein graft to distal RCA.     . Essential hypertension 12/20/2013  . GERD (gastroesophageal reflux disease)   . Hyperlipidemia 12/20/2013  . Paroxysmal atrial fibrillation (HCC)   . Right bundle branch block 12/20/2013  . Stroke [facility])     Past Surgical History:  Procedure Laterality Date  . CARDIAC SURGERY  2000   BYPASS  . CARDIAC SURGERY     quadruple by pass surgery  . HERNIA REPAIR  2005  . LOOP RECORDER IMPLANT    . PROSTATE SURGERY  1985  . TEE WITHOUT CARDIOVERSION N/A 04/02/2014   Procedure: TRANSESOPHAGEAL ECHOCARDIOGRAM (TEE);  Surgeon: Lelon Perla, MD;  Location: [facility] ENDOSCOPY;  Service: Cardiovascular;  Laterality: N/A;    Current Medications: Current Meds  Medication Sig  . amLODipine (NORVASC) 5 MG tablet Take 5 mg by mouth at bedtime.  Marland Kitchen apixaban (ELIQUIS) 5 MG TABS tablet Take 1 tablet (5 mg total) by mouth 2 (two) times daily.  Marland Kitchen atorvastatin (LIPITOR) 40 MG tablet Take 40 mg by mouth  at bedtime.  Marland Kitchen ezetimibe (ZETIA) 10 MG tablet Take 5 mg by mouth at bedtime.  . ferrous sulfate 325 (65 FE) MG tablet Take 325 mg by mouth daily with breakfast.  . finasteride (PROSCAR) 5 MG tablet Take 5 mg by mouth daily at 12 noon.  . metFORMIN (GLUCOPHAGE) 500 MG tablet Take 250 mg by mouth daily.  . Multiple Vitamin (MULTIVITAMIN WITH MINERALS) TABS tablet Take 1 tablet by mouth daily.  . Multiple Vitamins-Minerals (PRESERVISION AREDS 2 PO) Take 1 capsule by mouth 2 (two) times daily.   Marland Kitchen olmesartan-hydrochlorothiazide (BENICAR HCT) 40-12.5 MG tablet Take 1 tablet by mouth daily.  . pantoprazole (PROTONIX) 20 MG tablet Take 20 mg by mouth daily.  . traMADol (ULTRAM) 50 MG tablet Take 50 mg by mouth every 6 (six) hours as needed for moderate pain.   . [DISCONTINUED] atenolol (TENORMIN) 25 MG tablet Take 12.5 mg by mouth daily at 12 noon.      Allergies:   Monosodium glutamate   Social History   Socioeconomic History  . Marital status: Married    Spouse name: Not on file  . Number of children: 3  . Years of education: BSEE  . Highest education level: Not on file  Occupational History  . Not on file  Social Needs  . Financial resource strain: Not on file  .  Food insecurity    Worry: Not on file    Inability: Not on file  . Transportation needs    Medical: Not on file    Non-medical: Not on file  Tobacco Use  . Smoking status: Never Smoker  . Smokeless tobacco: Never Used  Substance and Sexual Activity  . Alcohol use: Yes    Alcohol/week: 1.0 standard drinks    Types: 1 Glasses of wine per week    Comment: SOCIAL  . Drug use: No  . Sexual activity: Not Currently  Lifestyle  . Physical activity    Days per week: Not on file    Minutes per session: Not on file  . Stress: Not on file  Relationships  . Social Herbalist on phone: Not on file    Gets together: Not on file    Attends religious service: Not on file    Active member of club or organization: Not  on file    Attends meetings of clubs or organizations: Not on file    Relationship status: Not on file  Other Topics Concern  . Not on file  Social History Narrative   Patient is married with 3 children.   Patient is right handed.   Patient has his BSEE degree.   Patient drinks 2 cups daily.     Family History: The patient's family history includes Hypertension in his mother.  ROS:   Please see the history of present illness.    No major complaints.  Chronic swelling right lower extremity greater than left.  All other systems reviewed and are negative.  EKGs/Labs/Other Studies Reviewed:    The following studies were reviewed today: No new data  EKG:  EKG ATRIAL fibrillation with slow ventricular response, heart rate 44 bpm.  When compared to the prior tracing performed in August 2019 the heart rate is slower.  The tracing is otherwise unremarkable demonstrating right bundle branch block.  Recent Labs: No results found for requested labs within last 8760 hours.  Recent Lipid Panel    Component Value Date/Time   CHOL 81 12/05/2014 0427   TRIG 83 12/05/2014 0427   HDL 29 (L) 12/05/2014 0427   CHOLHDL 2.8 12/05/2014 0427   VLDL 17 12/05/2014 0427   LDLCALC 35 12/05/2014 0427    Physical Exam:    VS:  BP (!) 162/64   Pulse (!) 44   Ht 5\' 11"  (1.803 m)   Wt 168 lb (76.2 kg)   SpO2 97%   BMI 23.43 kg/m     Wt Readings from Last 3 Encounters:  01/17/19 168 lb (76.2 kg)  11/23/17 189 lb 12.8 oz (86.1 kg)  11/16/16 188 lb 6.4 oz (85.5 kg)     GEN: Vision in his stated age. No acute distress HEENT: Normal NECK: No JVD. LYMPHATICS: No lymphadenopathy CARDIAC:  RRR without murmur, gallop, or edema. VASCULAR:  Normal Pulses. No bruits. RESPIRATORY:  Clear to auscultation without rales, wheezing or rhonchi  ABDOMEN: Soft, non-tender, non-distended, No pulsatile mass, MUSCULOSKELETAL: No deformity  SKIN: Warm and dry NEUROLOGIC:  Alert and oriented x 3 PSYCHIATRIC:   Normal affect   ASSESSMENT:    1. Atherosclerosis of coronary artery bypass graft of native heart with stable angina pectoris (HCC)   2. Paroxysmal atrial fibrillation ([place])   3. Essential hypertension   4. Other hyperlipidemia   5. Chronic anticoagulation   6. Cryptogenic stroke ([place])   7. Educated about COVID-19 virus infection  PLAN:    In order of problems listed above:  1. Secondary prevention is reviewed.  Last total cholesterol was 80 in September.  He is on 40 mg of atorvastatin daily.  I would not change this. 2. Persistent atrial fibrillation is present for which the patient is anticoagulated with Eliquis. 3. Systolic blood pressures are relatively high.  This could be related to bradycardia.  We are discontinuing atenolol and his heart rate increases the systolic will decrease somewhat however our goal should be a systolic blood pressure less than 145 mmHg.  He will monitor blood pressure with his home device over the next 2 weeks.  If the blood pressure remains too high, we will consider increasing diuretic intensity. 4. Please see comments under atherosclerosis relative to lipid management. 5. Recent creatinine is 1.24 and the patient's weight is 168.  Therefore 5 mg twice daily of Eliquis is appropriate 6. No new neurological complaints 7. Social distancing, handwashing, and mass wearing.  Overall education and awareness concerning primary/secondary risk prevention was discussed in detail: LDL less than 70, hemoglobin A1c less than 7, blood pressure target less than 130/80 mmHg, >150 minutes of moderate aerobic activity per week, avoidance of smoking, weight control (via diet and exercise), and continued surveillance/management of/for obstructive sleep apnea.    Medication Adjustments/Labs and Tests Ordered: Current medicines are reviewed at length with the patient today.  Concerns regarding medicines are outlined above.  No orders of the defined types were placed in  this encounter.  No orders of the defined types were placed in this encounter.   There are no Patient Instructions on file for this visit.   Signed, Sinclair Grooms, MD  01/17/2019 2:40 PM    [place]

## 2019-01-17 ENCOUNTER — Encounter: Payer: Self-pay | Admitting: Interventional Cardiology

## 2019-01-17 ENCOUNTER — Encounter

## 2019-01-17 ENCOUNTER — Other Ambulatory Visit: Payer: Self-pay

## 2019-01-17 ENCOUNTER — Ambulatory Visit (INDEPENDENT_AMBULATORY_CARE_PROVIDER_SITE_OTHER): Payer: Medicare Other | Admitting: Interventional Cardiology

## 2019-01-17 VITALS — BP 162/64 | HR 44 | Ht 71.0 in | Wt 168.0 lb

## 2019-01-17 DIAGNOSIS — Z7901 Long term (current) use of anticoagulants: Secondary | ICD-10-CM

## 2019-01-17 DIAGNOSIS — Z7189 Other specified counseling: Secondary | ICD-10-CM

## 2019-01-17 DIAGNOSIS — E7849 Other hyperlipidemia: Secondary | ICD-10-CM

## 2019-01-17 DIAGNOSIS — I639 Cerebral infarction, unspecified: Secondary | ICD-10-CM | POA: Diagnosis not present

## 2019-01-17 DIAGNOSIS — I10 Essential (primary) hypertension: Secondary | ICD-10-CM

## 2019-01-17 DIAGNOSIS — I25708 Atherosclerosis of coronary artery bypass graft(s), unspecified, with other forms of angina pectoris: Secondary | ICD-10-CM

## 2019-01-17 DIAGNOSIS — I480 Paroxysmal atrial fibrillation: Secondary | ICD-10-CM | POA: Diagnosis not present

## 2019-01-17 NOTE — Patient Instructions (Addendum)
Medication Instructions:  1) DISCONTINUE Atenolol.  Monitor your blood pressure at least three times per week for the next two weeks and then contact the office with those readings. Check the blood pressure at least 2 hours after your Amlodipine.   If you need a refill on your cardiac medications before your next appointment, please call your pharmacy.   Lab work: None If you have labs (blood work) drawn today and your tests are completely normal, you will receive your results only by: Marland Kitchen MyChart Message (if you have MyChart) OR . A paper copy in the mail If you have any lab test that is abnormal or we need to change your treatment, we will call you to review the results.  Testing/Procedures: None  Follow-Up: At [facility], you and your health needs are our priority.  As part of our continuing mission to provide you with exceptional heart care, we have created designated Provider Care Teams.  These Care Teams include your primary Cardiologist (physician) and Advanced Practice Providers (APPs -  Physician Assistants and Nurse Practitioners) who all work together to provide you with the care you need, when you need it. You will need a follow up appointment in 12 months.  Please call our office 2 months in advance to schedule this appointment.  You may see Dr. Daneen Schick or one of the following Advanced Practice Providers on your designated Care Team:   Truitt Merle, NP Cecilie Kicks, NP . Kathyrn Drown, NP  Any Other Special Instructions Will Be Listed Below (If Applicable).

## 2019-01-18 ENCOUNTER — Ambulatory Visit
Admission: RE | Admit: 2019-01-18 | Discharge: 2019-01-18 | Disposition: A | Discharge status: Home / Self Care | Payer: Medicare Other | Source: Ambulatory Visit | Attending: Internal Medicine | Admitting: Internal Medicine

## 2019-01-18 DIAGNOSIS — K7689 Other specified diseases of liver: Secondary | ICD-10-CM | POA: Diagnosis not present

## 2019-01-18 DIAGNOSIS — R634 Abnormal weight loss: Secondary | ICD-10-CM

## 2019-01-18 DIAGNOSIS — J9811 Atelectasis: Secondary | ICD-10-CM | POA: Diagnosis not present

## 2019-01-18 DIAGNOSIS — K4090 Unilateral inguinal hernia, without obstruction or gangrene, not specified as recurrent: Secondary | ICD-10-CM | POA: Diagnosis not present

## 2019-01-18 DIAGNOSIS — D1809 Hemangioma of other sites: Secondary | ICD-10-CM | POA: Diagnosis not present

## 2019-01-18 DIAGNOSIS — N400 Enlarged prostate without lower urinary tract symptoms: Secondary | ICD-10-CM | POA: Diagnosis not present

## 2019-01-18 DIAGNOSIS — J90 Pleural effusion, not elsewhere classified: Secondary | ICD-10-CM | POA: Diagnosis not present

## 2019-01-18 MED ORDER — IOPAMIDOL (ISOVUE-300) INJECTION 61%
100.0000 mL | Freq: Once | INTRAVENOUS | Status: AC | PRN
  Administered 2019-01-18: 100 mL via INTRAVENOUS

## 2019-01-29 DIAGNOSIS — E785 Hyperlipidemia, unspecified: Secondary | ICD-10-CM | POA: Diagnosis not present

## 2019-01-29 DIAGNOSIS — I10 Essential (primary) hypertension: Secondary | ICD-10-CM | POA: Diagnosis not present

## 2019-01-29 DIAGNOSIS — R634 Abnormal weight loss: Secondary | ICD-10-CM | POA: Diagnosis not present

## 2019-01-29 DIAGNOSIS — E119 Type 2 diabetes mellitus without complications: Secondary | ICD-10-CM | POA: Diagnosis not present

## 2019-02-14 DIAGNOSIS — D1801 Hemangioma of skin and subcutaneous tissue: Secondary | ICD-10-CM | POA: Diagnosis not present

## 2019-02-14 DIAGNOSIS — Z85828 Personal history of other malignant neoplasm of skin: Secondary | ICD-10-CM | POA: Diagnosis not present

## 2019-02-14 DIAGNOSIS — C44222 Squamous cell carcinoma of skin of right ear and external auricular canal: Secondary | ICD-10-CM | POA: Diagnosis not present

## 2019-02-14 DIAGNOSIS — D485 Neoplasm of uncertain behavior of skin: Secondary | ICD-10-CM | POA: Diagnosis not present

## 2019-02-14 DIAGNOSIS — L570 Actinic keratosis: Secondary | ICD-10-CM | POA: Diagnosis not present

## 2019-02-14 DIAGNOSIS — D171 Benign lipomatous neoplasm of skin and subcutaneous tissue of trunk: Secondary | ICD-10-CM | POA: Diagnosis not present

## 2019-02-14 DIAGNOSIS — D225 Melanocytic nevi of trunk: Secondary | ICD-10-CM | POA: Diagnosis not present

## 2019-02-14 DIAGNOSIS — L821 Other seborrheic keratosis: Secondary | ICD-10-CM | POA: Diagnosis not present

## 2019-02-19 DIAGNOSIS — H353221 Exudative age-related macular degeneration, left eye, with active choroidal neovascularization: Secondary | ICD-10-CM | POA: Diagnosis not present

## 2019-02-19 DIAGNOSIS — H26491 Other secondary cataract, right eye: Secondary | ICD-10-CM | POA: Diagnosis not present

## 2019-02-19 DIAGNOSIS — H5203 Hypermetropia, bilateral: Secondary | ICD-10-CM | POA: Diagnosis not present

## 2019-02-19 DIAGNOSIS — H40051 Ocular hypertension, right eye: Secondary | ICD-10-CM | POA: Diagnosis not present

## 2019-02-26 DIAGNOSIS — H353221 Exudative age-related macular degeneration, left eye, with active choroidal neovascularization: Secondary | ICD-10-CM | POA: Diagnosis not present

## 2019-04-16 DIAGNOSIS — Z23 Encounter for immunization: Secondary | ICD-10-CM | POA: Diagnosis not present

## 2019-04-24 DIAGNOSIS — R634 Abnormal weight loss: Secondary | ICD-10-CM | POA: Diagnosis not present

## 2019-04-24 DIAGNOSIS — E119 Type 2 diabetes mellitus without complications: Secondary | ICD-10-CM | POA: Diagnosis not present

## 2019-04-24 DIAGNOSIS — D649 Anemia, unspecified: Secondary | ICD-10-CM | POA: Diagnosis not present

## 2019-04-24 DIAGNOSIS — I10 Essential (primary) hypertension: Secondary | ICD-10-CM | POA: Diagnosis not present

## 2019-05-01 DIAGNOSIS — E119 Type 2 diabetes mellitus without complications: Secondary | ICD-10-CM | POA: Diagnosis not present

## 2019-05-01 DIAGNOSIS — E785 Hyperlipidemia, unspecified: Secondary | ICD-10-CM | POA: Diagnosis not present

## 2019-05-01 DIAGNOSIS — R972 Elevated prostate specific antigen [PSA]: Secondary | ICD-10-CM | POA: Diagnosis not present

## 2019-05-01 DIAGNOSIS — D649 Anemia, unspecified: Secondary | ICD-10-CM | POA: Diagnosis not present

## 2019-05-01 DIAGNOSIS — I10 Essential (primary) hypertension: Secondary | ICD-10-CM | POA: Diagnosis not present

## 2019-05-04 DIAGNOSIS — M79671 Pain in right foot: Secondary | ICD-10-CM | POA: Diagnosis not present

## 2019-05-04 DIAGNOSIS — M79672 Pain in left foot: Secondary | ICD-10-CM | POA: Diagnosis not present

## 2019-05-04 DIAGNOSIS — B351 Tinea unguium: Secondary | ICD-10-CM | POA: Diagnosis not present

## 2019-05-04 DIAGNOSIS — G5761 Lesion of plantar nerve, right lower limb: Secondary | ICD-10-CM | POA: Diagnosis not present

## 2019-05-09 DIAGNOSIS — H353221 Exudative age-related macular degeneration, left eye, with active choroidal neovascularization: Secondary | ICD-10-CM | POA: Diagnosis not present

## 2019-05-14 DIAGNOSIS — Z23 Encounter for immunization: Secondary | ICD-10-CM | POA: Diagnosis not present

## 2019-06-13 DIAGNOSIS — H35373 Puckering of macula, bilateral: Secondary | ICD-10-CM | POA: Diagnosis not present

## 2019-06-13 DIAGNOSIS — Z961 Presence of intraocular lens: Secondary | ICD-10-CM | POA: Diagnosis not present

## 2019-06-13 DIAGNOSIS — H353112 Nonexudative age-related macular degeneration, right eye, intermediate dry stage: Secondary | ICD-10-CM | POA: Diagnosis not present

## 2019-06-13 DIAGNOSIS — H35363 Drusen (degenerative) of macula, bilateral: Secondary | ICD-10-CM | POA: Diagnosis not present

## 2019-06-13 DIAGNOSIS — H353221 Exudative age-related macular degeneration, left eye, with active choroidal neovascularization: Secondary | ICD-10-CM | POA: Diagnosis not present

## 2019-06-13 DIAGNOSIS — H35453 Secondary pigmentary degeneration, bilateral: Secondary | ICD-10-CM | POA: Diagnosis not present

## 2019-06-20 DIAGNOSIS — N402 Nodular prostate without lower urinary tract symptoms: Secondary | ICD-10-CM | POA: Diagnosis not present

## 2019-06-20 DIAGNOSIS — R3914 Feeling of incomplete bladder emptying: Secondary | ICD-10-CM | POA: Diagnosis not present

## 2019-06-20 DIAGNOSIS — N401 Enlarged prostate with lower urinary tract symptoms: Secondary | ICD-10-CM | POA: Diagnosis not present

## 2019-07-24 DIAGNOSIS — H353221 Exudative age-related macular degeneration, left eye, with active choroidal neovascularization: Secondary | ICD-10-CM | POA: Diagnosis not present

## 2019-08-29 DIAGNOSIS — H353221 Exudative age-related macular degeneration, left eye, with active choroidal neovascularization: Secondary | ICD-10-CM | POA: Diagnosis not present

## 2019-11-02 DIAGNOSIS — H43392 Other vitreous opacities, left eye: Secondary | ICD-10-CM | POA: Diagnosis not present

## 2019-11-02 DIAGNOSIS — H33322 Round hole, left eye: Secondary | ICD-10-CM | POA: Diagnosis not present

## 2019-11-02 DIAGNOSIS — H33102 Unspecified retinoschisis, left eye: Secondary | ICD-10-CM | POA: Diagnosis not present

## 2019-11-02 DIAGNOSIS — H53142 Visual discomfort, left eye: Secondary | ICD-10-CM | POA: Diagnosis not present

## 2019-11-02 DIAGNOSIS — H35432 Paving stone degeneration of retina, left eye: Secondary | ICD-10-CM | POA: Diagnosis not present

## 2019-11-02 DIAGNOSIS — H353221 Exudative age-related macular degeneration, left eye, with active choroidal neovascularization: Secondary | ICD-10-CM | POA: Diagnosis not present

## 2019-11-02 DIAGNOSIS — H43812 Vitreous degeneration, left eye: Secondary | ICD-10-CM | POA: Diagnosis not present

## 2019-11-12 DIAGNOSIS — H353221 Exudative age-related macular degeneration, left eye, with active choroidal neovascularization: Secondary | ICD-10-CM | POA: Diagnosis not present

## 2019-11-14 DIAGNOSIS — Z85828 Personal history of other malignant neoplasm of skin: Secondary | ICD-10-CM | POA: Diagnosis not present

## 2019-11-14 DIAGNOSIS — I8311 Varicose veins of right lower extremity with inflammation: Secondary | ICD-10-CM | POA: Diagnosis not present

## 2019-11-14 DIAGNOSIS — I8312 Varicose veins of left lower extremity with inflammation: Secondary | ICD-10-CM | POA: Diagnosis not present

## 2019-11-14 DIAGNOSIS — B353 Tinea pedis: Secondary | ICD-10-CM | POA: Diagnosis not present

## 2019-11-14 DIAGNOSIS — I872 Venous insufficiency (chronic) (peripheral): Secondary | ICD-10-CM | POA: Diagnosis not present

## 2019-11-27 DIAGNOSIS — E119 Type 2 diabetes mellitus without complications: Secondary | ICD-10-CM | POA: Diagnosis not present

## 2019-11-27 DIAGNOSIS — D649 Anemia, unspecified: Secondary | ICD-10-CM | POA: Diagnosis not present

## 2019-11-27 DIAGNOSIS — I10 Essential (primary) hypertension: Secondary | ICD-10-CM | POA: Diagnosis not present

## 2019-11-27 DIAGNOSIS — R972 Elevated prostate specific antigen [PSA]: Secondary | ICD-10-CM | POA: Diagnosis not present

## 2019-12-03 DIAGNOSIS — K5900 Constipation, unspecified: Secondary | ICD-10-CM | POA: Diagnosis not present

## 2019-12-03 DIAGNOSIS — I10 Essential (primary) hypertension: Secondary | ICD-10-CM | POA: Diagnosis not present

## 2019-12-03 DIAGNOSIS — I639 Cerebral infarction, unspecified: Secondary | ICD-10-CM | POA: Diagnosis not present

## 2019-12-03 DIAGNOSIS — E119 Type 2 diabetes mellitus without complications: Secondary | ICD-10-CM | POA: Diagnosis not present

## 2019-12-03 DIAGNOSIS — I2510 Atherosclerotic heart disease of native coronary artery without angina pectoris: Secondary | ICD-10-CM | POA: Diagnosis not present

## 2019-12-03 DIAGNOSIS — B0229 Other postherpetic nervous system involvement: Secondary | ICD-10-CM | POA: Diagnosis not present

## 2019-12-03 DIAGNOSIS — E785 Hyperlipidemia, unspecified: Secondary | ICD-10-CM | POA: Diagnosis not present

## 2019-12-03 DIAGNOSIS — D509 Iron deficiency anemia, unspecified: Secondary | ICD-10-CM | POA: Diagnosis not present

## 2019-12-03 DIAGNOSIS — I480 Paroxysmal atrial fibrillation: Secondary | ICD-10-CM | POA: Diagnosis not present

## 2019-12-19 DIAGNOSIS — H353221 Exudative age-related macular degeneration, left eye, with active choroidal neovascularization: Secondary | ICD-10-CM | POA: Diagnosis not present

## 2020-01-15 DIAGNOSIS — Z23 Encounter for immunization: Secondary | ICD-10-CM | POA: Diagnosis not present

## 2020-01-23 DIAGNOSIS — H353221 Exudative age-related macular degeneration, left eye, with active choroidal neovascularization: Secondary | ICD-10-CM | POA: Diagnosis not present

## 2020-02-06 NOTE — Progress Notes (Signed)
Cardiology Office Note:    Date:  02/08/2020   ID:  [name], DOB 02/15/1923, MRN [id number]  PCP:  Jani Gravel, MD  Cardiologist:  Sinclair Grooms, MD   Referring MD: Jani Gravel, MD   Chief Complaint  Patient presents with  . Atrial Fibrillation  . Coronary Artery Disease    History of Present Illness:    [name] is a 86 y.o. male with a hx of coronary artery disease with bypass grafting in 2000, essential hypertension, hyperlipidemia, paroxysmal atrial fibrillation, and known right bundle branch block, and history of embolic CVA.  Doing okay.  No angina, dyspnea, or edema.  Has not had syncope.  Is compliant with his medication regimen.  Able to lie flat without shortness of breath.  He has not needed nitroglycerin.   Past Medical History:  Diagnosis Date  . BPH (benign prostatic hyperplasia)   . Coronary atherosclerosis of artery bypass graft 12/20/2013   CASHD with CABG 2000 with LIMA to LAD, SV graft to diagonal, SVG to OM, and saphenous vein graft to distal RCA.     . Essential hypertension 12/20/2013  . GERD (gastroesophageal reflux disease)   . Hyperlipidemia 12/20/2013  . Paroxysmal atrial fibrillation (HCC)   . Right bundle branch block 12/20/2013  . Stroke [facility])     Past Surgical History:  Procedure Laterality Date  . CARDIAC SURGERY  2000   BYPASS  . CARDIAC SURGERY     quadruple by pass surgery  . HERNIA REPAIR  2005  . LOOP RECORDER IMPLANT    . PROSTATE SURGERY  1985  . TEE WITHOUT CARDIOVERSION N/A 04/02/2014   Procedure: TRANSESOPHAGEAL ECHOCARDIOGRAM (TEE);  Surgeon: Lelon Perla, MD;  Location: [facility] ENDOSCOPY;  Service: Cardiovascular;  Laterality: N/A;    Current Medications: Current Meds  Medication Sig  . amLODipine (NORVASC) 10 MG tablet Take 5 mg by mouth at bedtime.   Marland Kitchen apixaban (ELIQUIS) 5 MG TABS tablet Take 1 tablet (5 mg total) by mouth 2 (two) times daily.  Marland Kitchen atorvastatin (LIPITOR) 40 MG tablet Take 40 mg by mouth at  bedtime.  Marland Kitchen ezetimibe (ZETIA) 10 MG tablet Take 5 mg by mouth at bedtime.  . ferrous sulfate 325 (65 FE) MG tablet Take 325 mg by mouth daily with breakfast.  . finasteride (PROSCAR) 5 MG tablet Take 5 mg by mouth daily at 12 noon.  . Multiple Vitamin (MULTIVITAMIN WITH MINERALS) TABS tablet Take 1 tablet by mouth daily.  . Multiple Vitamins-Minerals (PRESERVISION AREDS 2 PO) Take 1 capsule by mouth 2 (two) times daily.   Marland Kitchen olmesartan-hydrochlorothiazide (BENICAR HCT) 40-12.5 MG tablet Take 1 tablet by mouth daily.  . pantoprazole (PROTONIX) 20 MG tablet Take 20 mg by mouth daily.  . traMADol (ULTRAM) 50 MG tablet Take 50 mg by mouth every 6 (six) hours as needed for moderate pain.      Allergies:   Monosodium glutamate   Social History   Socioeconomic History  . Marital status: Married    Spouse name: Not on file  . Number of children: 3  . Years of education: BSEE  . Highest education level: Not on file  Occupational History  . Not on file  Tobacco Use  . Smoking status: Never Smoker  . Smokeless tobacco: Never Used  Substance and Sexual Activity  . Alcohol use: Yes    Alcohol/week: 1.0 standard drink    Types: 1 Glasses of wine per week    Comment: SOCIAL  .  Drug use: No  . Sexual activity: Not Currently  Other Topics Concern  . Not on file  Social History Narrative   Patient is married with 3 children.   Patient is right handed.   Patient has his BSEE degree.   Patient drinks 2 cups daily.   Social Determinants of Health   Financial Resource Strain:   . Difficulty of Paying Living Expenses: Not on file  Food Insecurity:   . Worried About Charity fundraiser in the Last Year: Not on file  . Ran Out of Food in the Last Year: Not on file  Transportation Needs:   . Lack of Transportation (Medical): Not on file  . Lack of Transportation (Non-Medical): Not on file  Physical Activity:   . Days of Exercise per Week: Not on file  . Minutes of Exercise per Session: Not  on file  Stress:   . Feeling of Stress : Not on file  Social Connections:   . Frequency of Communication with Friends and Family: Not on file  . Frequency of Social Gatherings with Friends and Family: Not on file  . Attends Religious Services: Not on file  . Active Member of Clubs or Organizations: Not on file  . Attends Archivist Meetings: Not on file  . Marital Status: Not on file     Family History: The patient's family history includes Hypertension in his mother.  ROS:   Please see the history of present illness.    Denies difficulty sleeping.  Has not fallen.  Some difficulty with memory.  All other systems reviewed and are negative.  EKGs/Labs/Other Studies Reviewed:    The following studies were reviewed today: No new cardiac evaluation  EKG:  EKG atrial fibrillation, controlled rate is 70, right bundle, otherwise unremarkable.  When compared to prior from 2020, the heart rate is faster.  Recent Labs: No results found for requested labs within last 8760 hours.  Recent Lipid Panel    Component Value Date/Time   CHOL 81 12/05/2014 0427   TRIG 83 12/05/2014 0427   HDL 29 (L) 12/05/2014 0427   CHOLHDL 2.8 12/05/2014 0427   VLDL 17 12/05/2014 0427   LDLCALC 35 12/05/2014 0427    Physical Exam:    VS:  BP (!) 146/66   Pulse 70   Ht 5\' 11"  (1.803 m)   Wt 163 lb (73.9 kg)   SpO2 97%   BMI 22.73 kg/m     Wt Readings from Last 3 Encounters:  02/08/20 163 lb (73.9 kg)  01/17/19 168 lb (76.2 kg)  11/23/17 189 lb 12.8 oz (86.1 kg)     GEN: Compatible with age.. No acute distress HEENT: Normal NECK: No JVD. LYMPHATICS: No lymphadenopathy CARDIAC: Irregularly irregular RR without murmur, gallop, or edema. VASCULAR:  Normal Pulses. No bruits. RESPIRATORY:  Clear to auscultation without rales, wheezing or rhonchi  ABDOMEN: Soft, non-tender, non-distended, No pulsatile mass, MUSCULOSKELETAL: No deformity  SKIN: Warm and dry NEUROLOGIC:  Alert and  oriented x 3 PSYCHIATRIC:  Normal affect   ASSESSMENT:    1. Atherosclerosis of coronary artery bypass graft of native heart with stable angina pectoris (HCC)   2. Paroxysmal atrial fibrillation ([place])   3. Essential hypertension   4. Other hyperlipidemia   5. Chronic anticoagulation   6. Cryptogenic stroke ([place])   7. Educated about COVID-19 virus infection    PLAN:    In order of problems listed above:  1. Secondary prevention briefly discussed. 2. Atrial  fibrillation is continuous.  No evidence of heart failure. 3. Blood pressures well controlled for age.  Low-salt diet discussed.  Continue Norvasc 10 mg/day.  Benicar HCT has been discontinued. 4. Continue statin therapy 40 mg/day. 5. Continue Eliquis 5 mg twice daily.  He has no nosebleeds.  Creatinine and hemoglobin will be obtained today 6. No new neurological complaints. 7. He has been vaccinated and is looking forward to the booster.  He is practicing social distancing.  Clinical follow-up in 1 year   Medication Adjustments/Labs and Tests Ordered: Current medicines are reviewed at length with the patient today.  Concerns regarding medicines are outlined above.  Orders Placed This Encounter  Procedures  . EKG 12-Lead   No orders of the defined types were placed in this encounter.   There are no Patient Instructions on file for this visit.   Signed, Sinclair Grooms, MD  02/08/2020 3:39 PM    [place]

## 2020-02-08 ENCOUNTER — Encounter: Payer: Self-pay | Admitting: Interventional Cardiology

## 2020-02-08 ENCOUNTER — Ambulatory Visit (INDEPENDENT_AMBULATORY_CARE_PROVIDER_SITE_OTHER): Payer: Medicare Other | Admitting: Interventional Cardiology

## 2020-02-08 ENCOUNTER — Other Ambulatory Visit: Payer: Self-pay

## 2020-02-08 VITALS — BP 146/66 | HR 70 | Ht 71.0 in | Wt 163.0 lb

## 2020-02-08 DIAGNOSIS — M79671 Pain in right foot: Secondary | ICD-10-CM | POA: Diagnosis not present

## 2020-02-08 DIAGNOSIS — I10 Essential (primary) hypertension: Secondary | ICD-10-CM | POA: Diagnosis not present

## 2020-02-08 DIAGNOSIS — E7849 Other hyperlipidemia: Secondary | ICD-10-CM

## 2020-02-08 DIAGNOSIS — I25708 Atherosclerosis of coronary artery bypass graft(s), unspecified, with other forms of angina pectoris: Secondary | ICD-10-CM | POA: Diagnosis not present

## 2020-02-08 DIAGNOSIS — I480 Paroxysmal atrial fibrillation: Secondary | ICD-10-CM | POA: Diagnosis not present

## 2020-02-08 DIAGNOSIS — Z7189 Other specified counseling: Secondary | ICD-10-CM | POA: Diagnosis not present

## 2020-02-08 DIAGNOSIS — G5761 Lesion of plantar nerve, right lower limb: Secondary | ICD-10-CM | POA: Diagnosis not present

## 2020-02-08 DIAGNOSIS — M2041 Other hammer toe(s) (acquired), right foot: Secondary | ICD-10-CM | POA: Diagnosis not present

## 2020-02-08 DIAGNOSIS — I639 Cerebral infarction, unspecified: Secondary | ICD-10-CM | POA: Diagnosis not present

## 2020-02-08 DIAGNOSIS — M7751 Other enthesopathy of right foot: Secondary | ICD-10-CM | POA: Diagnosis not present

## 2020-02-08 DIAGNOSIS — Z7901 Long term (current) use of anticoagulants: Secondary | ICD-10-CM | POA: Diagnosis not present

## 2020-02-08 NOTE — Patient Instructions (Signed)
Medication Instructions:  Your physician recommends that you continue on your current medications as directed. Please refer to the Current Medication list given to you today.  *If you need a refill on your cardiac medications before your next appointment, please call your pharmacy*   Lab Work: BMET and CBC today  If you have labs (blood work) drawn today and your tests are completely normal, you will receive your results only by: Marland Kitchen MyChart Message (if you have MyChart) OR . A paper copy in the mail If you have any lab test that is abnormal or we need to change your treatment, we will call you to review the results.   Testing/Procedures: None   Follow-Up: At [facility], you and your health needs are our priority.  As part of our continuing mission to provide you with exceptional heart care, we have created designated Provider Care Teams.  These Care Teams include your primary Cardiologist (physician) and Advanced Practice Providers (APPs -  Physician Assistants and Nurse Practitioners) who all work together to provide you with the care you need, when you need it.  We recommend signing up for the patient portal called "MyChart".  Sign up information is provided on this After Visit Summary.  MyChart is used to connect with patients for Virtual Visits (Telemedicine).  Patients are able to view lab/test results, encounter notes, upcoming appointments, etc.  Non-urgent messages can be sent to your provider as well.   To learn more about what you can do with MyChart, go to [email or web address].    Your next appointment:   12 month(s)  The format for your next appointment:   In Person  Provider:   You may see Sinclair Grooms, MD or one of the following Advanced Practice Providers on your designated Care Team:    Truitt Merle, NP  Cecilie Kicks, NP  Kathyrn Drown, NP    Other Instructions

## 2020-02-09 LAB — BASIC METABOLIC PANEL
BUN/Creatinine Ratio: 25 — ABNORMAL HIGH (ref 10–24)
BUN: 39 mg/dL — ABNORMAL HIGH (ref 10–36)
CO2: 22 mmol/L (ref 20–29)
Calcium: 8.6 mg/dL (ref 8.6–10.2)
Chloride: 105 mmol/L (ref 96–106)
Creatinine, Ser: 1.57 mg/dL — ABNORMAL HIGH (ref 0.76–1.27)
GFR calc Af Amer: 43 mL/min/{1.73_m2} — ABNORMAL LOW (ref >59)
GFR calc non Af Amer: 37 mL/min/{1.73_m2} — ABNORMAL LOW (ref >59)
Glucose: 128 mg/dL — ABNORMAL HIGH (ref 65–99)
Potassium: 4.7 mmol/L (ref 3.5–5.2)
Sodium: 139 mmol/L (ref 134–144)

## 2020-02-09 LAB — CBC
Hematocrit: 32.8 % — ABNORMAL LOW (ref 37.5–51.0)
Hemoglobin: 11 g/dL — ABNORMAL LOW (ref 13.0–17.7)
MCH: 30.1 pg (ref 26.6–33.0)
MCHC: 33.5 g/dL (ref 31.5–35.7)
MCV: 90 fL (ref 79–97)
Platelets: 244 10*3/uL (ref 150–450)
RBC: 3.65 x10E6/uL — ABNORMAL LOW (ref 4.14–5.80)
RDW: 13.6 % (ref 11.6–15.4)
WBC: 7.8 10*3/uL (ref 3.4–10.8)

## 2020-02-11 ENCOUNTER — Telehealth: Payer: Self-pay | Admitting: Interventional Cardiology

## 2020-02-11 MED ORDER — APIXABAN 2.5 MG PO TABS: 2.5000 mg | ORAL_TABLET | Freq: Two times a day (BID) | ORAL | 11 refills | Status: DC

## 2020-02-11 NOTE — Telephone Encounter (Signed)
Pt called in returning Jennifer's call about lab results   Best number [phone]

## 2020-02-11 NOTE — Telephone Encounter (Signed)
Spoke with pt about results and recommendations per Dr. Tamala Julian. Pt agreeable to plan. Gets prescriptions from the [place]. Advised I will get Dr. Tamala Julian to sign a copy on Wednesday when he is back in the office. Pt appreciative for call.

## 2020-02-19 DIAGNOSIS — D171 Benign lipomatous neoplasm of skin and subcutaneous tissue of trunk: Secondary | ICD-10-CM | POA: Diagnosis not present

## 2020-02-19 DIAGNOSIS — I872 Venous insufficiency (chronic) (peripheral): Secondary | ICD-10-CM | POA: Diagnosis not present

## 2020-02-19 DIAGNOSIS — L821 Other seborrheic keratosis: Secondary | ICD-10-CM | POA: Diagnosis not present

## 2020-02-19 DIAGNOSIS — Z85828 Personal history of other malignant neoplasm of skin: Secondary | ICD-10-CM | POA: Diagnosis not present

## 2020-02-19 DIAGNOSIS — I8312 Varicose veins of left lower extremity with inflammation: Secondary | ICD-10-CM | POA: Diagnosis not present

## 2020-02-19 DIAGNOSIS — L570 Actinic keratosis: Secondary | ICD-10-CM | POA: Diagnosis not present

## 2020-02-19 DIAGNOSIS — D1801 Hemangioma of skin and subcutaneous tissue: Secondary | ICD-10-CM | POA: Diagnosis not present

## 2020-02-19 DIAGNOSIS — I8311 Varicose veins of right lower extremity with inflammation: Secondary | ICD-10-CM | POA: Diagnosis not present

## 2020-02-19 DIAGNOSIS — L218 Other seborrheic dermatitis: Secondary | ICD-10-CM | POA: Diagnosis not present

## 2020-02-20 ENCOUNTER — Telehealth: Payer: Self-pay | Admitting: Interventional Cardiology

## 2020-02-20 DIAGNOSIS — H401111 Primary open-angle glaucoma, right eye, mild stage: Secondary | ICD-10-CM | POA: Diagnosis not present

## 2020-02-20 DIAGNOSIS — H353221 Exudative age-related macular degeneration, left eye, with active choroidal neovascularization: Secondary | ICD-10-CM | POA: Diagnosis not present

## 2020-02-20 DIAGNOSIS — H26491 Other secondary cataract, right eye: Secondary | ICD-10-CM | POA: Diagnosis not present

## 2020-02-20 DIAGNOSIS — H52203 Unspecified astigmatism, bilateral: Secondary | ICD-10-CM | POA: Diagnosis not present

## 2020-02-20 NOTE — Telephone Encounter (Signed)
Faxed over prescription and confirmation sheet received.  Called pt and made him aware.  Pt appreciative for call.

## 2020-02-20 NOTE — Telephone Encounter (Signed)
° ° °  Pt is following up his medications. He said [facility] not yet [facility] the new prescription DR. Tamala Julian got for him. He gave [place] fax# [phone] and he said to send it to [facility]

## 2020-02-25 DIAGNOSIS — Z23 Encounter for immunization: Secondary | ICD-10-CM | POA: Diagnosis not present

## 2020-03-05 DIAGNOSIS — M79675 Pain in left toe(s): Secondary | ICD-10-CM | POA: Diagnosis not present

## 2020-03-05 DIAGNOSIS — G5761 Lesion of plantar nerve, right lower limb: Secondary | ICD-10-CM | POA: Diagnosis not present

## 2020-03-05 DIAGNOSIS — B351 Tinea unguium: Secondary | ICD-10-CM | POA: Diagnosis not present

## 2020-03-05 DIAGNOSIS — L600 Ingrowing nail: Secondary | ICD-10-CM | POA: Diagnosis not present

## 2020-03-05 DIAGNOSIS — M79674 Pain in right toe(s): Secondary | ICD-10-CM | POA: Diagnosis not present

## 2020-03-17 DIAGNOSIS — H353221 Exudative age-related macular degeneration, left eye, with active choroidal neovascularization: Secondary | ICD-10-CM | POA: Diagnosis not present

## 2020-03-24 DIAGNOSIS — Z961 Presence of intraocular lens: Secondary | ICD-10-CM | POA: Diagnosis not present

## 2020-03-24 DIAGNOSIS — H26491 Other secondary cataract, right eye: Secondary | ICD-10-CM | POA: Diagnosis not present

## 2020-03-24 DIAGNOSIS — H401131 Primary open-angle glaucoma, bilateral, mild stage: Secondary | ICD-10-CM | POA: Diagnosis not present

## 2020-04-21 DIAGNOSIS — H353221 Exudative age-related macular degeneration, left eye, with active choroidal neovascularization: Secondary | ICD-10-CM | POA: Diagnosis not present

## 2020-04-22 DIAGNOSIS — H26491 Other secondary cataract, right eye: Secondary | ICD-10-CM | POA: Diagnosis not present

## 2020-05-26 DIAGNOSIS — H353221 Exudative age-related macular degeneration, left eye, with active choroidal neovascularization: Secondary | ICD-10-CM | POA: Diagnosis not present

## 2020-05-29 DIAGNOSIS — E119 Type 2 diabetes mellitus without complications: Secondary | ICD-10-CM | POA: Diagnosis not present

## 2020-05-29 DIAGNOSIS — R946 Abnormal results of thyroid function studies: Secondary | ICD-10-CM | POA: Diagnosis not present

## 2020-05-29 DIAGNOSIS — E785 Hyperlipidemia, unspecified: Secondary | ICD-10-CM | POA: Diagnosis not present

## 2020-05-29 DIAGNOSIS — I10 Essential (primary) hypertension: Secondary | ICD-10-CM | POA: Diagnosis not present

## 2020-06-09 DIAGNOSIS — R7989 Other specified abnormal findings of blood chemistry: Secondary | ICD-10-CM | POA: Diagnosis not present

## 2020-06-09 DIAGNOSIS — E785 Hyperlipidemia, unspecified: Secondary | ICD-10-CM | POA: Diagnosis not present

## 2020-06-09 DIAGNOSIS — E119 Type 2 diabetes mellitus without complications: Secondary | ICD-10-CM | POA: Diagnosis not present

## 2020-06-09 DIAGNOSIS — Z0000 Encounter for general adult medical examination without abnormal findings: Secondary | ICD-10-CM | POA: Diagnosis not present

## 2020-06-09 DIAGNOSIS — D509 Iron deficiency anemia, unspecified: Secondary | ICD-10-CM | POA: Diagnosis not present

## 2020-06-09 DIAGNOSIS — I129 Hypertensive chronic kidney disease with stage 1 through stage 4 chronic kidney disease, or unspecified chronic kidney disease: Secondary | ICD-10-CM | POA: Diagnosis not present

## 2020-06-09 DIAGNOSIS — N1832 Chronic kidney disease, stage 3b: Secondary | ICD-10-CM | POA: Diagnosis not present

## 2020-06-09 DIAGNOSIS — K219 Gastro-esophageal reflux disease without esophagitis: Secondary | ICD-10-CM | POA: Diagnosis not present

## 2020-06-24 DIAGNOSIS — N402 Nodular prostate without lower urinary tract symptoms: Secondary | ICD-10-CM | POA: Diagnosis not present

## 2020-06-30 DIAGNOSIS — H353221 Exudative age-related macular degeneration, left eye, with active choroidal neovascularization: Secondary | ICD-10-CM | POA: Diagnosis not present

## 2020-07-02 DIAGNOSIS — R3912 Poor urinary stream: Secondary | ICD-10-CM | POA: Diagnosis not present

## 2020-07-02 DIAGNOSIS — N401 Enlarged prostate with lower urinary tract symptoms: Secondary | ICD-10-CM | POA: Diagnosis not present

## 2020-08-07 DIAGNOSIS — H353221 Exudative age-related macular degeneration, left eye, with active choroidal neovascularization: Secondary | ICD-10-CM | POA: Diagnosis not present

## 2020-09-05 DIAGNOSIS — H353221 Exudative age-related macular degeneration, left eye, with active choroidal neovascularization: Secondary | ICD-10-CM | POA: Diagnosis not present

## 2020-09-17 DIAGNOSIS — Z23 Encounter for immunization: Secondary | ICD-10-CM | POA: Diagnosis not present

## 2020-09-25 DIAGNOSIS — H401131 Primary open-angle glaucoma, bilateral, mild stage: Secondary | ICD-10-CM | POA: Diagnosis not present

## 2020-10-15 DIAGNOSIS — H353221 Exudative age-related macular degeneration, left eye, with active choroidal neovascularization: Secondary | ICD-10-CM | POA: Diagnosis not present

## 2020-11-12 DIAGNOSIS — H353221 Exudative age-related macular degeneration, left eye, with active choroidal neovascularization: Secondary | ICD-10-CM | POA: Diagnosis not present

## 2020-11-24 DIAGNOSIS — E785 Hyperlipidemia, unspecified: Secondary | ICD-10-CM | POA: Diagnosis not present

## 2020-11-24 DIAGNOSIS — E119 Type 2 diabetes mellitus without complications: Secondary | ICD-10-CM | POA: Diagnosis not present

## 2020-11-24 DIAGNOSIS — I129 Hypertensive chronic kidney disease with stage 1 through stage 4 chronic kidney disease, or unspecified chronic kidney disease: Secondary | ICD-10-CM | POA: Diagnosis not present

## 2020-11-24 DIAGNOSIS — R7989 Other specified abnormal findings of blood chemistry: Secondary | ICD-10-CM | POA: Diagnosis not present

## 2020-12-02 DIAGNOSIS — H353221 Exudative age-related macular degeneration, left eye, with active choroidal neovascularization: Secondary | ICD-10-CM | POA: Diagnosis not present

## 2020-12-02 DIAGNOSIS — H3562 Retinal hemorrhage, left eye: Secondary | ICD-10-CM | POA: Diagnosis not present

## 2020-12-02 DIAGNOSIS — Z961 Presence of intraocular lens: Secondary | ICD-10-CM | POA: Diagnosis not present

## 2020-12-02 DIAGNOSIS — H35453 Secondary pigmentary degeneration, bilateral: Secondary | ICD-10-CM | POA: Diagnosis not present

## 2020-12-02 DIAGNOSIS — H35363 Drusen (degenerative) of macula, bilateral: Secondary | ICD-10-CM | POA: Diagnosis not present

## 2020-12-02 DIAGNOSIS — H353112 Nonexudative age-related macular degeneration, right eye, intermediate dry stage: Secondary | ICD-10-CM | POA: Diagnosis not present

## 2020-12-08 DIAGNOSIS — I10 Essential (primary) hypertension: Secondary | ICD-10-CM | POA: Diagnosis not present

## 2020-12-08 DIAGNOSIS — E119 Type 2 diabetes mellitus without complications: Secondary | ICD-10-CM | POA: Diagnosis not present

## 2020-12-08 DIAGNOSIS — N1832 Chronic kidney disease, stage 3b: Secondary | ICD-10-CM | POA: Diagnosis not present

## 2020-12-08 DIAGNOSIS — Z125 Encounter for screening for malignant neoplasm of prostate: Secondary | ICD-10-CM | POA: Diagnosis not present

## 2020-12-08 DIAGNOSIS — D649 Anemia, unspecified: Secondary | ICD-10-CM | POA: Diagnosis not present

## 2020-12-08 DIAGNOSIS — E785 Hyperlipidemia, unspecified: Secondary | ICD-10-CM | POA: Diagnosis not present

## 2020-12-08 DIAGNOSIS — D509 Iron deficiency anemia, unspecified: Secondary | ICD-10-CM | POA: Diagnosis not present

## 2020-12-08 DIAGNOSIS — I480 Paroxysmal atrial fibrillation: Secondary | ICD-10-CM | POA: Diagnosis not present

## 2020-12-11 DIAGNOSIS — H353221 Exudative age-related macular degeneration, left eye, with active choroidal neovascularization: Secondary | ICD-10-CM | POA: Diagnosis not present

## 2020-12-31 DIAGNOSIS — Z23 Encounter for immunization: Secondary | ICD-10-CM | POA: Diagnosis not present

## 2021-01-07 IMAGING — CT CT ABD-PELV W/ CM
3 series · 14 of 32 positions shown, 18 images · IV contrast (APPLIED)
Comparison: CT chest 12/05/2014 and CT abdomen/pelvis 08/11/2016

CLINICAL DATA: Abnormal weight loss over the past 9-10 months.

EXAM:
CT CHEST, ABDOMEN, AND PELVIS WITH CONTRAST
TECHNIQUE: Multidetector CT imaging of the chest, abdomen and pelvis was
performed following the standard protocol during bolus
administration of intravenous contrast.
CONTRAST:  100mL V45MMF-0SS IOPAMIDOL (V45MMF-0SS) INJECTION 61%

[Series 2: chest/abd/pelvis w/cm · axial · 0.70mm/px · z∈[-590,-40]mm · 8 of 132 slices shown]
[im 11/132  soft-tissue]
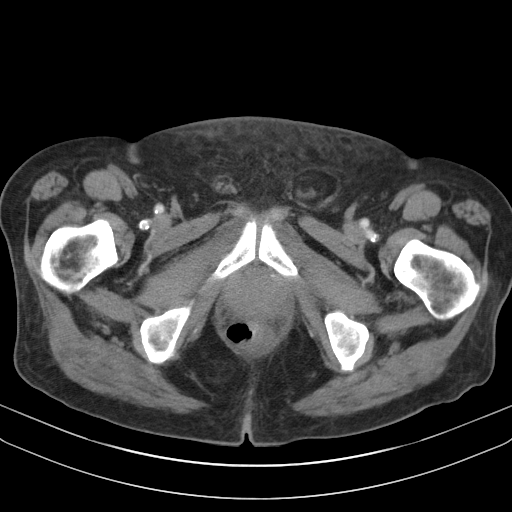
[im 33/132  soft-tissue]
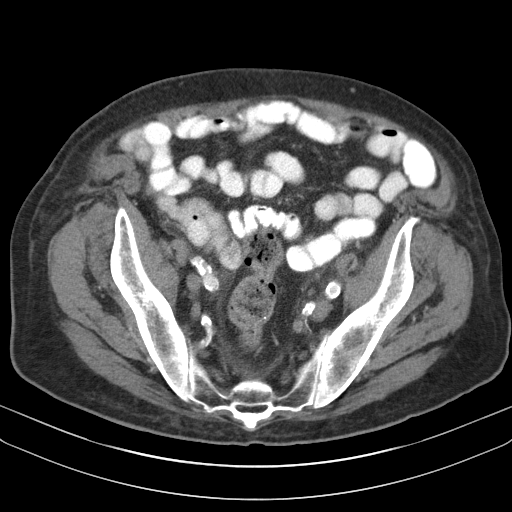
[im 44/132  soft-tissue]
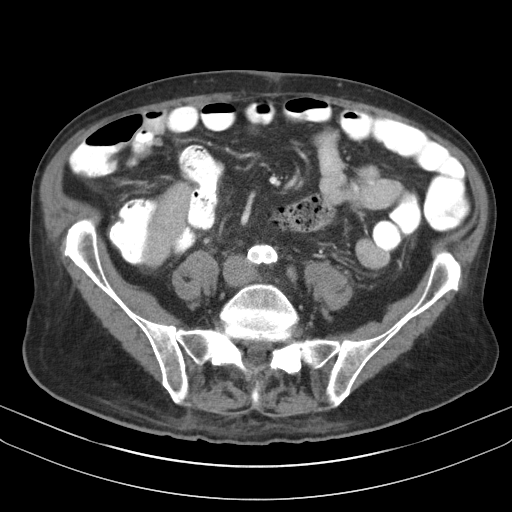
[im 55/132  soft-tissue]
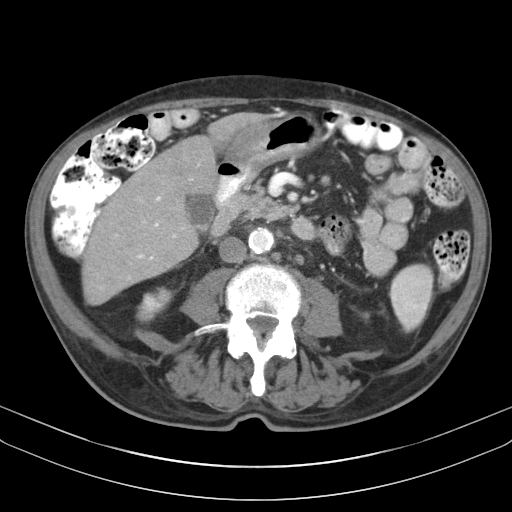
[im 77/132  soft-tissue]
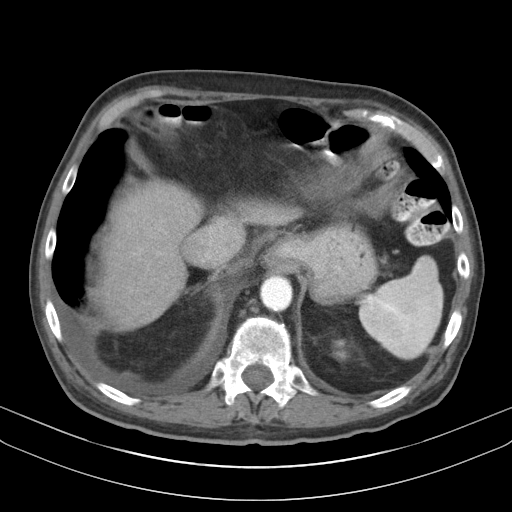
[im 88/132  soft-tissue]
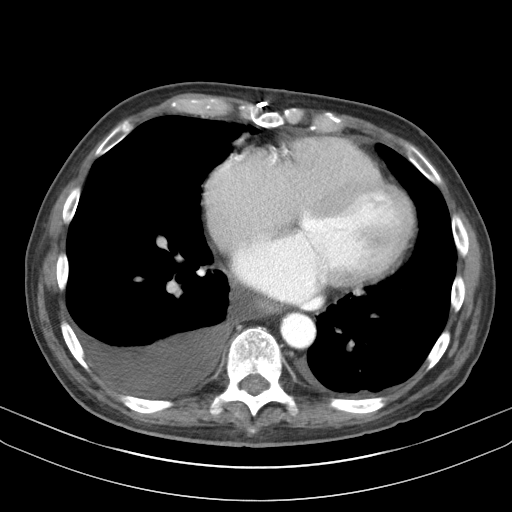
[im 99/132  soft-tissue]
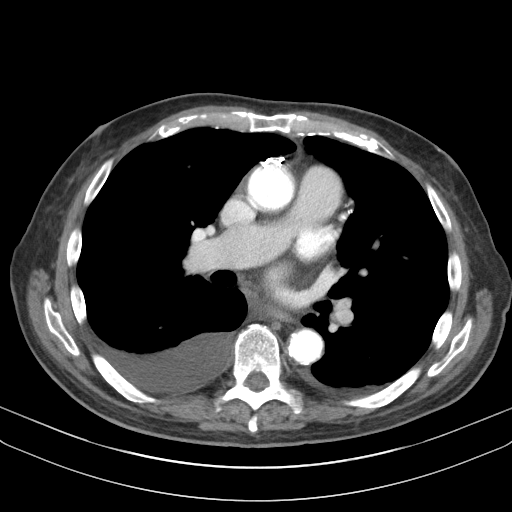
[im 121/132  soft-tissue]
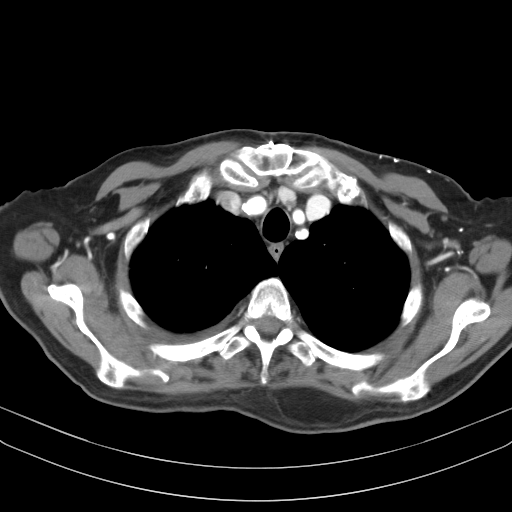

[Series 6: lung · axial · 0.62mm/px · z∈[-289,-201]mm · 3 of 163 slices shown]
[im 11/163  bone]
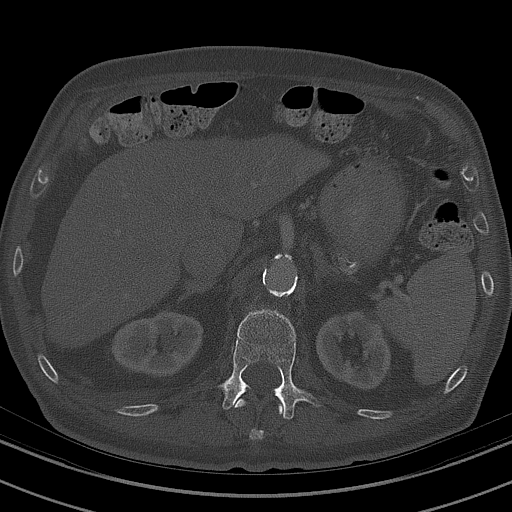
[im 33/163  bone]
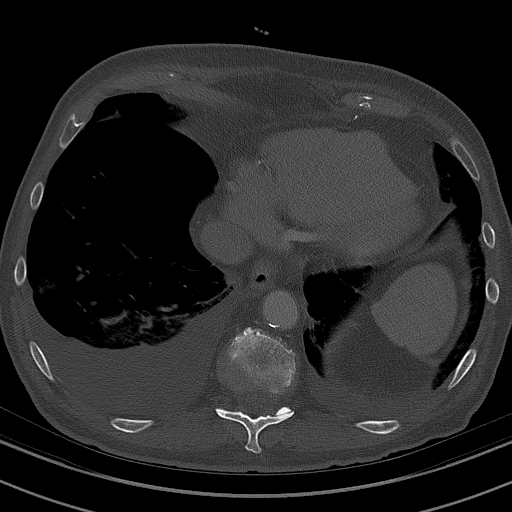
[im 55/163  bone]
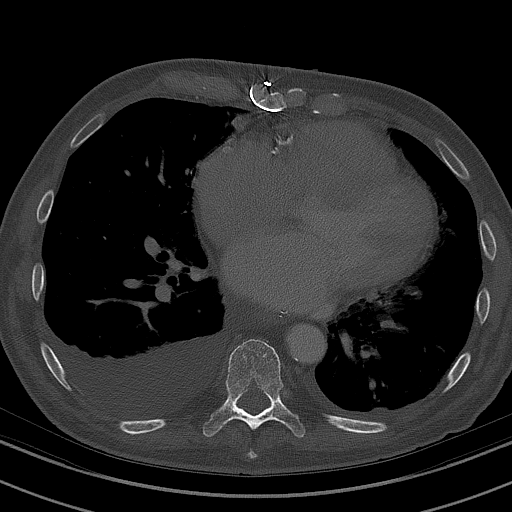

[Series 7: renal delay · axial · delayed · 0.70mm/px · z∈[-386,-246]mm · 3 of 29 slices shown, 7 images]
[im 1/29  soft-tissue]
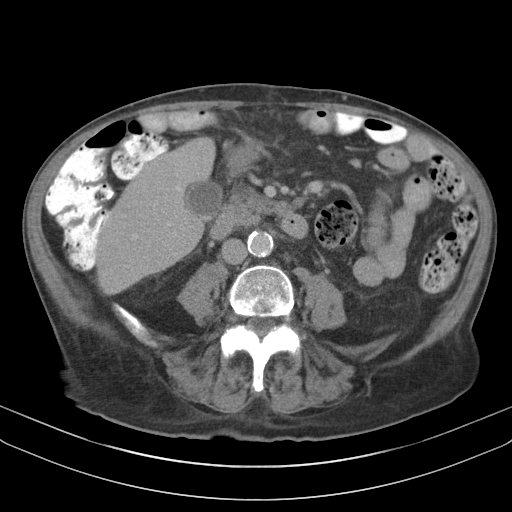
[im 1/29  lung]
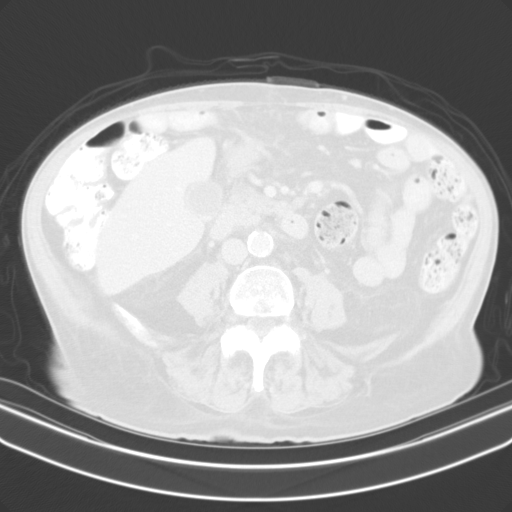
[im 1/29  bone]
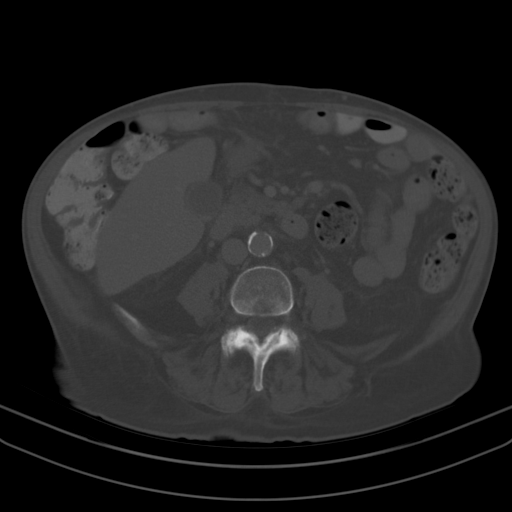
[im 15/29  soft-tissue]
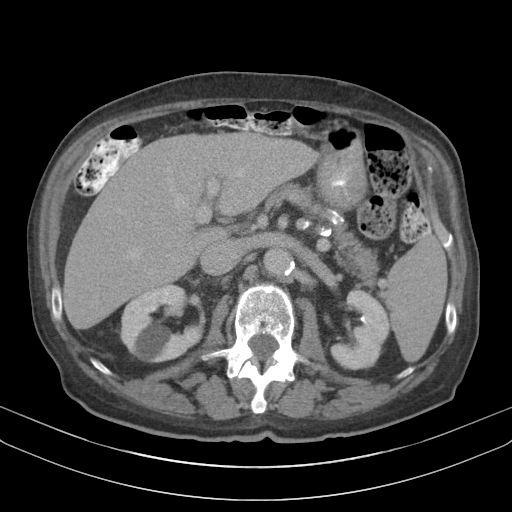
[im 15/29  lung]
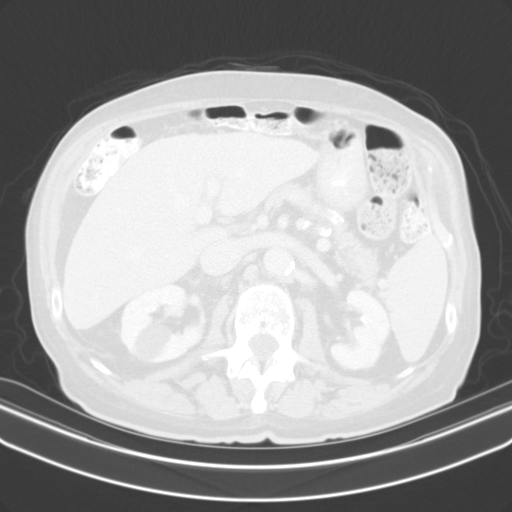
[im 29/29  soft-tissue]
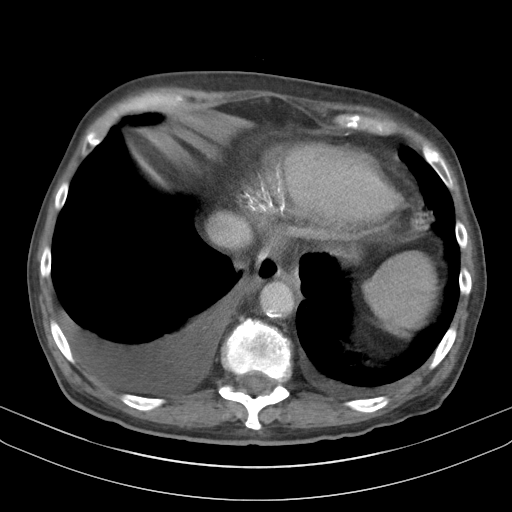
[im 29/29  lung]
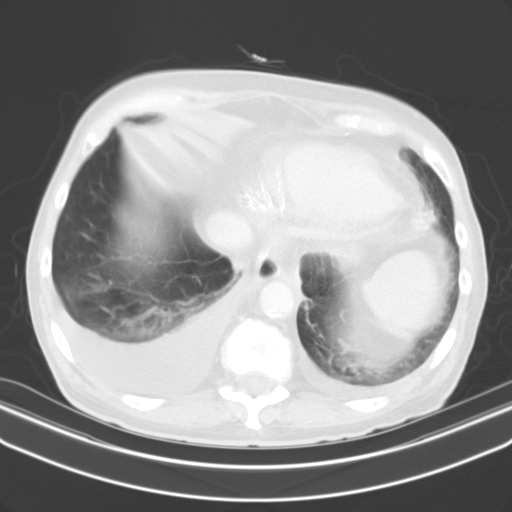

[14 of 32 positions shown; findings below may reference images not displayed]

FINDINGS: CT CHEST FINDINGS

Cardiovascular: Heart mild cardiomegaly. Median sternotomy wires are
present. There is calcified plaque over the coronary arteries
calcified plaque over the thoracic aorta. Thoracic aorta is normal
caliber without dissection. Pulmonary arterial system is
unremarkable. Remaining vascular structures are unremarkable.

Mediastinum/Nodes: No mediastinal or hilar adenopathy. Remaining
mediastinal structures are unremarkable.

Lungs/Pleura: Lungs are well inflated and demonstrate a small to
moderate right-sided pleural effusion with minimal bibasilar
dependent atelectasis. Tiny amount of left pleural fluid is present.
No focal lobar consolidation. Tiny peripheral 2 mm nodule right
middle lobe unchanged. Airways are unremarkable.

Musculoskeletal: Degenerative change of the spine.

CT ABDOMEN PELVIS FINDINGS

Hepatobiliary: Mild cholelithiasis versus gallbladder sludge
unchanged. Two small hypodensities over the left lobe of the liver
unchanged likely cysts. Peripheral enhancing small nodular density
over the lateral segment left lobe of the liver unchanged likely a
hemangioma. Biliary tree is within normal.

Pancreas: Normal.

Spleen: Normal.

Adrenals/Urinary Tract: Adrenal glands are normal. Kidneys are
normal in size without hydronephrosis or nephrolithiasis. Bilateral
renal cysts. Ureters and bladder are unremarkable.

Stomach/Bowel: Stomach and small bowel are normal. Appendix not
visualized. Colon is unremarkable.

Vascular/Lymphatic: Moderate calcified plaque over the abdominal
aorta and iliac arteries. No adenopathy. Few small stable
subcentimeter periaortic lymph nodes.

Reproductive: Moderately enlarged prostate unchanged.

Other: No free fluid or focal inflammatory change. Small left
inguinal hernia containing only peritoneal fat.

Musculoskeletal: Degenerative change of the spine.
IMPRESSION: 1. No evidence of focal mass or adenopathy within the chest, abdomen
or pelvis.

2. Small right effusion and tiny left pleural effusion with minimal
associated dependent bibasilar atelectasis.

3. Several small liver cysts as well as small left lobe hemangioma
unchanged. Bilateral renal cysts.

4.  Cholelithiasis versus gallbladder sludge unchanged.

5.  Moderate prostatic enlargement unchanged.

6. Aortic Atherosclerosis ([id number]-455.5). Atherosclerotic coronary
artery disease.

7.  Small left inguinal hernia containing only peritoneal fat.

## 2021-01-15 DIAGNOSIS — H353221 Exudative age-related macular degeneration, left eye, with active choroidal neovascularization: Secondary | ICD-10-CM | POA: Diagnosis not present

## 2021-01-15 DIAGNOSIS — Z23 Encounter for immunization: Secondary | ICD-10-CM | POA: Diagnosis not present

## 2021-01-26 DIAGNOSIS — H3562 Retinal hemorrhage, left eye: Secondary | ICD-10-CM | POA: Diagnosis not present

## 2021-01-26 DIAGNOSIS — H353221 Exudative age-related macular degeneration, left eye, with active choroidal neovascularization: Secondary | ICD-10-CM | POA: Diagnosis not present

## 2021-02-12 NOTE — Progress Notes (Signed)
Cardiology Office Note    Date:  02/18/2021   ID:  [name], DOB 20-Jul-1922, MRN [id number]   PCP:  [name], NP   [place]  Cardiologist:  Sinclair Grooms, MD   Advanced Practice Provider:  No care team member to display Electrophysiologist:  None   [id number]   Chief Complaint  Patient presents with   Follow-up    History of Present Illness:  [name] is a 87 y.o. male with history of CAD s/p CABG 2000, HTN, HLD, PAF, RBBB, Embolic CVA.  Patient last saw Dr. Tamala Julian 02/08/20 and doing well.   Patient comes in alone for f/u. Denies chest pain, dyspnea, dizziness, has chronic RL edema. No regular exercise but stays active at [facility] [facility]. On some committees. Still goes up to his place at [facility]. Says the Loop recorder is starting to bother him some-no pain, but skin thinner.   Past Medical History:  Diagnosis Date   BPH (benign prostatic hyperplasia)    Coronary atherosclerosis of artery bypass graft 12/20/2013   CASHD with CABG 2000 with LIMA to LAD, SV graft to diagonal, SVG to OM, and saphenous vein graft to distal RCA.      Essential hypertension 12/20/2013   GERD (gastroesophageal reflux disease)    Hyperlipidemia 12/20/2013   Paroxysmal atrial fibrillation (HCC)    Right bundle branch block 12/20/2013   Stroke [facility])     Past Surgical History:  Procedure Laterality Date   CARDIAC SURGERY  2000   BYPASS   CARDIAC SURGERY     quadruple by pass surgery   HERNIA REPAIR  2005   LOOP RECORDER IMPLANT     PROSTATE SURGERY  1985   TEE WITHOUT CARDIOVERSION N/A 04/02/2014   Procedure: TRANSESOPHAGEAL ECHOCARDIOGRAM (TEE);  Surgeon: Lelon Perla, MD;  Location: [facility] ENDOSCOPY;  Service: Cardiovascular;  Laterality: N/A;    Current Medications: Current Meds  Medication Sig   amLODipine (NORVASC) 10 MG tablet Take 5 mg by mouth at bedtime.    apixaban (ELIQUIS) 2.5 MG TABS tablet Take 1 tablet (2.5 mg total) by  mouth 2 (two) times daily.   atorvastatin (LIPITOR) 40 MG tablet Take 40 mg by mouth at bedtime.   ezetimibe (ZETIA) 10 MG tablet Take 5 mg by mouth at bedtime.   ferrous sulfate 325 (65 FE) MG tablet Take 325 mg by mouth daily with breakfast.   finasteride (PROSCAR) 5 MG tablet Take 5 mg by mouth daily at 12 noon.   Multiple Vitamin (MULTIVITAMIN WITH MINERALS) TABS tablet Take 1 tablet by mouth daily.   Multiple Vitamins-Minerals (PRESERVISION AREDS 2 PO) Take 1 capsule by mouth 2 (two) times daily.    olmesartan-hydrochlorothiazide (BENICAR HCT) 40-12.5 MG tablet Take 1 tablet by mouth daily.   pantoprazole (PROTONIX) 20 MG tablet Take 20 mg by mouth daily.   traMADol (ULTRAM) 50 MG tablet Take 50 mg by mouth every 6 (six) hours as needed for moderate pain.      Allergies:   Monosodium glutamate and Lisinopril   Social History   Socioeconomic History   Marital status: Married    Spouse name: Not on file   Number of children: 3   Years of education: BSEE   Highest education level: Not on file  Occupational History   Not on file  Tobacco Use   Smoking status: Never   Smokeless tobacco: Never  Substance and Sexual Activity   Alcohol use:  Yes    Alcohol/week: 1.0 standard drink    Types: 1 Glasses of wine per week    Comment: SOCIAL   Drug use: No   Sexual activity: Not Currently  Other Topics Concern   Not on file  Social History Narrative   Patient is married with 3 children.   Patient is right handed.   Patient has his BSEE degree.   Patient drinks 2 cups daily.   Social Determinants of Health   Financial Resource Strain: Not on file  Food Insecurity: Not on file  Transportation Needs: Not on file  Physical Activity: Not on file  Stress: Not on file  Social Connections: Not on file     Family History:  The patient's  family history includes Hypertension in his mother.   ROS:   Please see the history of present illness.    ROS All other systems reviewed and  are negative.   PHYSICAL EXAM:   VS:  BP 140/66   Pulse 69   Ht 5\' 11"  (1.803 m)   Wt 167 lb 6.4 oz (75.9 kg)   SpO2 97%   BMI 23.35 kg/m   Physical Exam  GEN: Thin, in no acute distress  Neck: no JVD, carotid bruits, or masses Cardiac:RRR; 2/6 systolic murmur LSB and apex  Respiratory:  clear to auscultation bilaterally, normal work of breathing GI: soft, nontender, nondistended, + BS Ext: without cyanosis, clubbing, or edema, Good distal pulses bilaterally Neuro:  Alert and Oriented x 3, Psych: euthymic mood, full affect  Wt Readings from Last 3 Encounters:  02/18/21 167 lb 6.4 oz (75.9 kg)  02/08/20 163 lb (73.9 kg)  01/17/19 168 lb (76.2 kg)      Studies/Labs Reviewed:   EKG:  EKG is  ordered today.  The ekg ordered today demonstrates Afib with RBBB unchanged  Recent Labs: No results found for requested labs within last 8760 hours.   Lipid Panel    Component Value Date/Time   CHOL 81 12/05/2014 0427   TRIG 83 12/05/2014 0427   HDL 29 (L) 12/05/2014 0427   CHOLHDL 2.8 12/05/2014 0427   VLDL 17 12/05/2014 0427   LDLCALC 35 12/05/2014 0427    Additional studies/ records that were reviewed today include:  Echo 12/05/14 Study Conclusions   - Left ventricle: The cavity size was normal. Systolic function was    normal. The estimated ejection fraction was in the range of 60%    to 65%. Wall motion was normal; there were no regional wall    motion abnormalities. Features are consistent with a pseudonormal    left ventricular filling pattern, with concomitant abnormal    relaxation and increased filling pressure (grade 2 diastolic    dysfunction). Doppler parameters are consistent with high    ventricular filling pressure.  - Mitral valve: There was mild regurgitation.  - Left atrium: The atrium was moderately dilated.  - Right ventricle: The cavity size was mildly dilated.  - Right atrium: The atrium was moderately dilated.  - Tricuspid valve: There was  moderate regurgitation.  - Pulmonary arteries: Systolic pressure was mildly increased. PA    peak pressure: 38 mm Hg (S).   Impressions:   - Normal LV systolic function; mild LVH; grade 2 diastolic    dysfunction; elevated LV filling pressure; moderate biatrial    enlargement; mild RVE; mild MR; moderate TR; mildly elevated    pulmonary pressure.   -------------------------------------------------------------------    Risk Assessment/Calculations:    CHA2DS2-VASc Score =  6   This indicates a 9.7% annual risk of stroke. The patient's score is based upon: CHF History: 0 HTN History: 1 Diabetes History: 0 Stroke History: 2 Vascular Disease History: 1 Age Score: 2 Gender Score: 0       ASSESSMENT:    1. Atherosclerosis of coronary artery bypass graft of native heart with stable angina pectoris ([facility])   2. Essential hypertension   3. Other hyperlipidemia   4. Paroxysmal atrial fibrillation (HCC)   5. Cryptogenic stroke (HCC)      PLAN:  In order of problems listed above:  CAD S/P CABG 2000 no angina  HTN well controlled on amlodipine benicar HCTZ  HLD on zetia and liptior  PAF on Eliquis-no bleeding problems -labs checked by PCP in Sept-they check it twice a year. He may want loop recorder removed but he'll wait to decide.  Embolic CVA-on Eliquis  Shared Decision Making/Informed Consent        Medication Adjustments/Labs and Tests Ordered: Current medicines are reviewed at length with the patient today.  Concerns regarding medicines are outlined above.  Medication changes, Labs and Tests ordered today are listed in the Patient Instructions below. Patient Instructions  Medication Instructions:  Your physician recommends that you continue on your current medications as directed. Please refer to the Current Medication list given to you today.  *If you need a refill on your cardiac medications before your next appointment, please call your pharmacy*   Lab  Work: None If you have labs (blood work) drawn today and your tests are completely normal, you will receive your results only by: [place] (if you have MyChart) OR A paper copy in the mail If you have any lab test that is abnormal or we need to change your treatment, we will call you to review the results.  Follow-Up: At [facility], you and your health needs are our priority.  As part of our continuing mission to provide you with exceptional heart care, we have created designated Provider Care Teams.  These Care Teams include your primary Cardiologist (physician) and Advanced Practice Providers (APPs -  Physician Assistants and Nurse Practitioners) who all work together to provide you with the care you need, when you need it.   Your next appointment:   6 months  The format for your next appointment:   In Person  Provider:   Sinclair Grooms, MD {    Signed, Ermalinda Barrios, PA-C  02/18/2021 12:04 PM    [address], [place], [place]  [address] Phone: [phone]; Fax: [phone]

## 2021-02-18 ENCOUNTER — Ambulatory Visit (INDEPENDENT_AMBULATORY_CARE_PROVIDER_SITE_OTHER): Payer: Medicare Other | Admitting: Physician Assistant

## 2021-02-18 ENCOUNTER — Encounter: Payer: Self-pay | Admitting: Physician Assistant

## 2021-02-18 ENCOUNTER — Other Ambulatory Visit: Payer: Self-pay

## 2021-02-18 ENCOUNTER — Telehealth: Payer: Self-pay | Admitting: Physician Assistant

## 2021-02-18 VITALS — BP 140/66 | HR 69 | Ht 71.0 in | Wt 167.4 lb

## 2021-02-18 DIAGNOSIS — I872 Venous insufficiency (chronic) (peripheral): Secondary | ICD-10-CM | POA: Diagnosis not present

## 2021-02-18 DIAGNOSIS — I8311 Varicose veins of right lower extremity with inflammation: Secondary | ICD-10-CM | POA: Diagnosis not present

## 2021-02-18 DIAGNOSIS — I639 Cerebral infarction, unspecified: Secondary | ICD-10-CM | POA: Diagnosis not present

## 2021-02-18 DIAGNOSIS — I8312 Varicose veins of left lower extremity with inflammation: Secondary | ICD-10-CM | POA: Diagnosis not present

## 2021-02-18 DIAGNOSIS — I25708 Atherosclerosis of coronary artery bypass graft(s), unspecified, with other forms of angina pectoris: Secondary | ICD-10-CM | POA: Diagnosis not present

## 2021-02-18 DIAGNOSIS — E7849 Other hyperlipidemia: Secondary | ICD-10-CM

## 2021-02-18 DIAGNOSIS — Z85828 Personal history of other malignant neoplasm of skin: Secondary | ICD-10-CM | POA: Diagnosis not present

## 2021-02-18 DIAGNOSIS — L821 Other seborrheic keratosis: Secondary | ICD-10-CM | POA: Diagnosis not present

## 2021-02-18 DIAGNOSIS — I480 Paroxysmal atrial fibrillation: Secondary | ICD-10-CM

## 2021-02-18 DIAGNOSIS — I10 Essential (primary) hypertension: Secondary | ICD-10-CM

## 2021-02-18 DIAGNOSIS — L570 Actinic keratosis: Secondary | ICD-10-CM | POA: Diagnosis not present

## 2021-02-18 NOTE — Patient Instructions (Signed)
Medication Instructions:  ?Your physician recommends that you continue on your current medications as directed. Please refer to the Current Medication list given to you today. ? ?*If you need a refill on your cardiac medications before your next appointment, please call your pharmacy* ? ? ?Lab Work: ?None ?If you have labs (blood work) drawn today and your tests are completely normal, you will receive your results only by: ?MyChart Message (if you have MyChart) OR ?A paper copy in the mail ?If you have any lab test that is abnormal or we need to change your treatment, we will call you to review the results. ? ? ?Follow-Up: ?At [facility], you and your health needs are our priority.  As part of our continuing mission to provide you with exceptional heart care, we have created designated Provider Care Teams.  These Care Teams include your primary Cardiologist (physician) and Advanced Practice Providers (APPs -  Physician Assistants and Nurse Practitioners) who all work together to provide you with the care you need, when you need it. ? ? ?Your next appointment:   ?6 month(s) ? ?The format for your next appointment:   ?In Person ? ?Provider:   ?Henry W Smith III, MD   ? ?

## 2021-02-18 NOTE — Telephone Encounter (Signed)
Patient was calling to let Ermalinda Barrios know that he takes Losartan HCTZ 50-12.5mg .

## 2021-02-19 ENCOUNTER — Other Ambulatory Visit: Payer: Self-pay

## 2021-02-19 DIAGNOSIS — H353221 Exudative age-related macular degeneration, left eye, with active choroidal neovascularization: Secondary | ICD-10-CM | POA: Diagnosis not present

## 2021-02-19 MED ORDER — LOSARTAN POTASSIUM-HCTZ 50-12.5 MG PO TABS: 1.0000 | ORAL_TABLET | Freq: Every day | ORAL | 3 refills | Status: DC

## 2021-03-30 DIAGNOSIS — H401112 Primary open-angle glaucoma, right eye, moderate stage: Secondary | ICD-10-CM | POA: Diagnosis not present

## 2021-03-30 DIAGNOSIS — H5203 Hypermetropia, bilateral: Secondary | ICD-10-CM | POA: Diagnosis not present

## 2021-03-30 DIAGNOSIS — Z961 Presence of intraocular lens: Secondary | ICD-10-CM | POA: Diagnosis not present

## 2021-04-01 DIAGNOSIS — H353221 Exudative age-related macular degeneration, left eye, with active choroidal neovascularization: Secondary | ICD-10-CM | POA: Diagnosis not present

## 2021-04-10 DIAGNOSIS — I10 Essential (primary) hypertension: Secondary | ICD-10-CM | POA: Diagnosis not present

## 2021-04-10 DIAGNOSIS — K219 Gastro-esophageal reflux disease without esophagitis: Secondary | ICD-10-CM | POA: Diagnosis not present

## 2021-04-10 DIAGNOSIS — E785 Hyperlipidemia, unspecified: Secondary | ICD-10-CM | POA: Diagnosis not present

## 2021-04-10 DIAGNOSIS — I480 Paroxysmal atrial fibrillation: Secondary | ICD-10-CM | POA: Diagnosis not present

## 2021-04-30 DIAGNOSIS — H401112 Primary open-angle glaucoma, right eye, moderate stage: Secondary | ICD-10-CM | POA: Diagnosis not present

## 2021-05-06 DIAGNOSIS — H353221 Exudative age-related macular degeneration, left eye, with active choroidal neovascularization: Secondary | ICD-10-CM | POA: Diagnosis not present

## 2021-06-10 DIAGNOSIS — H401112 Primary open-angle glaucoma, right eye, moderate stage: Secondary | ICD-10-CM | POA: Diagnosis not present

## 2021-06-15 DIAGNOSIS — H353221 Exudative age-related macular degeneration, left eye, with active choroidal neovascularization: Secondary | ICD-10-CM | POA: Diagnosis not present

## 2021-06-17 DIAGNOSIS — I480 Paroxysmal atrial fibrillation: Secondary | ICD-10-CM | POA: Diagnosis not present

## 2021-06-17 DIAGNOSIS — Z23 Encounter for immunization: Secondary | ICD-10-CM | POA: Diagnosis not present

## 2021-06-17 DIAGNOSIS — D509 Iron deficiency anemia, unspecified: Secondary | ICD-10-CM | POA: Diagnosis not present

## 2021-06-17 DIAGNOSIS — E119 Type 2 diabetes mellitus without complications: Secondary | ICD-10-CM | POA: Diagnosis not present

## 2021-06-17 DIAGNOSIS — N1832 Chronic kidney disease, stage 3b: Secondary | ICD-10-CM | POA: Diagnosis not present

## 2021-06-17 DIAGNOSIS — K219 Gastro-esophageal reflux disease without esophagitis: Secondary | ICD-10-CM | POA: Diagnosis not present

## 2021-06-17 DIAGNOSIS — R7303 Prediabetes: Secondary | ICD-10-CM | POA: Diagnosis not present

## 2021-06-17 DIAGNOSIS — R946 Abnormal results of thyroid function studies: Secondary | ICD-10-CM | POA: Diagnosis not present

## 2021-06-17 DIAGNOSIS — I10 Essential (primary) hypertension: Secondary | ICD-10-CM | POA: Diagnosis not present

## 2021-06-17 DIAGNOSIS — Z125 Encounter for screening for malignant neoplasm of prostate: Secondary | ICD-10-CM | POA: Diagnosis not present

## 2021-06-17 DIAGNOSIS — Z0000 Encounter for general adult medical examination without abnormal findings: Secondary | ICD-10-CM | POA: Diagnosis not present

## 2021-06-17 DIAGNOSIS — E785 Hyperlipidemia, unspecified: Secondary | ICD-10-CM | POA: Diagnosis not present

## 2021-06-17 DIAGNOSIS — R634 Abnormal weight loss: Secondary | ICD-10-CM | POA: Diagnosis not present

## 2021-06-17 DIAGNOSIS — D649 Anemia, unspecified: Secondary | ICD-10-CM | POA: Diagnosis not present

## 2021-06-26 DIAGNOSIS — N402 Nodular prostate without lower urinary tract symptoms: Secondary | ICD-10-CM | POA: Diagnosis not present

## 2021-07-03 DIAGNOSIS — R3912 Poor urinary stream: Secondary | ICD-10-CM | POA: Diagnosis not present

## 2021-07-03 DIAGNOSIS — N401 Enlarged prostate with lower urinary tract symptoms: Secondary | ICD-10-CM | POA: Diagnosis not present

## 2021-07-08 DIAGNOSIS — H401112 Primary open-angle glaucoma, right eye, moderate stage: Secondary | ICD-10-CM | POA: Diagnosis not present

## 2021-07-27 DIAGNOSIS — H353221 Exudative age-related macular degeneration, left eye, with active choroidal neovascularization: Secondary | ICD-10-CM | POA: Diagnosis not present

## 2021-08-05 DIAGNOSIS — H401112 Primary open-angle glaucoma, right eye, moderate stage: Secondary | ICD-10-CM | POA: Diagnosis not present

## 2021-08-26 DIAGNOSIS — H353112 Nonexudative age-related macular degeneration, right eye, intermediate dry stage: Secondary | ICD-10-CM | POA: Diagnosis not present

## 2021-08-26 DIAGNOSIS — H353221 Exudative age-related macular degeneration, left eye, with active choroidal neovascularization: Secondary | ICD-10-CM | POA: Diagnosis not present

## 2021-08-26 DIAGNOSIS — H35453 Secondary pigmentary degeneration, bilateral: Secondary | ICD-10-CM | POA: Diagnosis not present

## 2021-08-26 DIAGNOSIS — H35363 Drusen (degenerative) of macula, bilateral: Secondary | ICD-10-CM | POA: Diagnosis not present

## 2021-08-27 DIAGNOSIS — H401112 Primary open-angle glaucoma, right eye, moderate stage: Secondary | ICD-10-CM | POA: Diagnosis not present

## 2021-08-31 DIAGNOSIS — H353221 Exudative age-related macular degeneration, left eye, with active choroidal neovascularization: Secondary | ICD-10-CM | POA: Diagnosis not present

## 2021-09-23 DIAGNOSIS — Z23 Encounter for immunization: Secondary | ICD-10-CM | POA: Diagnosis not present

## 2021-10-05 DIAGNOSIS — H353221 Exudative age-related macular degeneration, left eye, with active choroidal neovascularization: Secondary | ICD-10-CM | POA: Diagnosis not present

## 2021-11-09 DIAGNOSIS — K219 Gastro-esophageal reflux disease without esophagitis: Secondary | ICD-10-CM | POA: Diagnosis not present

## 2021-11-09 DIAGNOSIS — H353221 Exudative age-related macular degeneration, left eye, with active choroidal neovascularization: Secondary | ICD-10-CM | POA: Diagnosis not present

## 2021-11-09 DIAGNOSIS — I10 Essential (primary) hypertension: Secondary | ICD-10-CM | POA: Diagnosis not present

## 2021-11-09 DIAGNOSIS — E785 Hyperlipidemia, unspecified: Secondary | ICD-10-CM | POA: Diagnosis not present

## 2021-11-09 DIAGNOSIS — I480 Paroxysmal atrial fibrillation: Secondary | ICD-10-CM | POA: Diagnosis not present

## 2021-11-16 DIAGNOSIS — L821 Other seborrheic keratosis: Secondary | ICD-10-CM | POA: Diagnosis not present

## 2021-11-16 DIAGNOSIS — D044 Carcinoma in situ of skin of scalp and neck: Secondary | ICD-10-CM | POA: Diagnosis not present

## 2021-11-16 DIAGNOSIS — L570 Actinic keratosis: Secondary | ICD-10-CM | POA: Diagnosis not present

## 2021-11-16 DIAGNOSIS — D485 Neoplasm of uncertain behavior of skin: Secondary | ICD-10-CM | POA: Diagnosis not present

## 2021-11-16 DIAGNOSIS — Z85828 Personal history of other malignant neoplasm of skin: Secondary | ICD-10-CM | POA: Diagnosis not present

## 2021-11-18 DIAGNOSIS — H1131 Conjunctival hemorrhage, right eye: Secondary | ICD-10-CM | POA: Diagnosis not present

## 2021-12-09 DIAGNOSIS — H353221 Exudative age-related macular degeneration, left eye, with active choroidal neovascularization: Secondary | ICD-10-CM | POA: Diagnosis not present

## 2021-12-15 DIAGNOSIS — N1832 Chronic kidney disease, stage 3b: Secondary | ICD-10-CM | POA: Diagnosis not present

## 2021-12-15 DIAGNOSIS — E785 Hyperlipidemia, unspecified: Secondary | ICD-10-CM | POA: Diagnosis not present

## 2021-12-15 DIAGNOSIS — R7303 Prediabetes: Secondary | ICD-10-CM | POA: Diagnosis not present

## 2021-12-22 DIAGNOSIS — N1832 Chronic kidney disease, stage 3b: Secondary | ICD-10-CM | POA: Diagnosis not present

## 2021-12-22 DIAGNOSIS — I10 Essential (primary) hypertension: Secondary | ICD-10-CM | POA: Diagnosis not present

## 2021-12-22 DIAGNOSIS — D509 Iron deficiency anemia, unspecified: Secondary | ICD-10-CM | POA: Diagnosis not present

## 2021-12-22 DIAGNOSIS — E785 Hyperlipidemia, unspecified: Secondary | ICD-10-CM | POA: Diagnosis not present

## 2021-12-22 DIAGNOSIS — K219 Gastro-esophageal reflux disease without esophagitis: Secondary | ICD-10-CM | POA: Diagnosis not present

## 2021-12-22 DIAGNOSIS — R7303 Prediabetes: Secondary | ICD-10-CM | POA: Diagnosis not present

## 2021-12-22 DIAGNOSIS — R809 Proteinuria, unspecified: Secondary | ICD-10-CM | POA: Diagnosis not present

## 2021-12-22 DIAGNOSIS — I480 Paroxysmal atrial fibrillation: Secondary | ICD-10-CM | POA: Diagnosis not present

## 2022-01-13 DIAGNOSIS — H353221 Exudative age-related macular degeneration, left eye, with active choroidal neovascularization: Secondary | ICD-10-CM | POA: Diagnosis not present

## 2022-01-18 NOTE — Progress Notes (Signed)
Cardiology Office Note:    Date:  01/21/2022   ID:  [name], DOB 1923-02-26, MRN [id number]  PCP:  [name], NP  Cardiologist:  Sinclair Grooms, MD   Referring MD: [name], NP   Chief Complaint  Patient presents with   Atrial Fibrillation   Coronary Artery Disease   Hypertension    History of Present Illness:    [name] is a 88 y.o. male with a hx of coronary artery disease with bypass grafting in 2000, essential hypertension, hyperlipidemia, paroxysmal atrial fibrillation, and known right bundle branch block, and history of embolic CVA.   He is doing well.  He has not had any acute issues.  He has some companionship now as he and a friend have moved in together.  [name] died about 4 years ago.  A friend at [facility] has moved in with him and according to the patient having companionship somewhat talk to his been very appealing and uplifting.  He is not having angina.  He denies orthopnea and PND.  No episodes of palpitation.  He has not had syncope.  He has not needed any nitroglycerin.  [facility] gets his medications from [facility].  No issues.  Past Medical History:  Diagnosis Date   BPH (benign prostatic hyperplasia)    Coronary atherosclerosis of artery bypass graft 12/20/2013   CASHD with CABG 2000 with LIMA to LAD, SV graft to diagonal, SVG to OM, and saphenous vein graft to distal RCA.      Essential hypertension 12/20/2013   GERD (gastroesophageal reflux disease)    Hyperlipidemia 12/20/2013   Paroxysmal atrial fibrillation (HCC)    Right bundle branch block 12/20/2013   Stroke [facility])     Past Surgical History:  Procedure Laterality Date   CARDIAC SURGERY  2000   BYPASS   CARDIAC SURGERY     quadruple by pass surgery   HERNIA REPAIR  2005   LOOP RECORDER IMPLANT     PROSTATE SURGERY  1985   TEE WITHOUT CARDIOVERSION N/A 04/02/2014   Procedure: TRANSESOPHAGEAL ECHOCARDIOGRAM (TEE);  Surgeon: Lelon Perla, MD;  Location: [facility]  ENDOSCOPY;  Service: Cardiovascular;  Laterality: N/A;    Current Medications: Current Meds  Medication Sig   amLODipine (NORVASC) 10 MG tablet Take 10 mg by mouth daily.   apixaban (ELIQUIS) 5 MG TABS tablet Take 5 mg by mouth 2 (two) times daily.   atorvastatin (LIPITOR) 40 MG tablet Take 40 mg by mouth at bedtime.   ezetimibe (ZETIA) 10 MG tablet Take 5 mg by mouth at bedtime.   ferrous sulfate 325 (65 FE) MG tablet Take 325 mg by mouth daily with breakfast.   finasteride (PROSCAR) 5 MG tablet Take 5 mg by mouth daily at 12 noon.   hydrochlorothiazide (HYDRODIURIL) 25 MG tablet Take 25 mg by mouth daily.   losartan (COZAAR) 100 MG tablet Take 1 tablet by mouth daily in the afternoon.   Multiple Vitamin (MULTIVITAMIN WITH MINERALS) TABS tablet Take 1 tablet by mouth daily.   Multiple Vitamins-Minerals (PRESERVISION AREDS 2 PO) Take 1 capsule by mouth 2 (two) times daily.    pantoprazole (PROTONIX) 20 MG tablet Take 20 mg by mouth daily.   traMADol (ULTRAM) 50 MG tablet Take 50 mg by mouth every 6 (six) hours as needed for moderate pain.      Allergies:   Monosodium glutamate and Lisinopril   Social History   Socioeconomic History   Marital  status: Married    Spouse name: Not on file   Number of children: 3   Years of education: BSEE   Highest education level: Not on file  Occupational History   Not on file  Tobacco Use   Smoking status: Never   Smokeless tobacco: Never  Substance and Sexual Activity   Alcohol use: Yes    Alcohol/week: 1.0 standard drink of alcohol    Types: 1 Glasses of wine per week    Comment: SOCIAL   Drug use: No   Sexual activity: Not Currently  Other Topics Concern   Not on file  Social History Narrative   Patient is married with 3 children.   Patient is right handed.   Patient has his BSEE degree.   Patient drinks 2 cups daily.   Social Determinants of Health   Financial Resource Strain: Not on file  Food Insecurity: Not on file   Transportation Needs: Not on file  Physical Activity: Not on file  Stress: Not on file  Social Connections: Not on file     Family History: The patient's family history includes Hypertension in his mother.  ROS:   Please see the history of present illness.    No bleeding or head trauma.  All other systems reviewed and are negative.  EKGs/Labs/Other Studies Reviewed:    The following studies were reviewed today:  2D Doppler echocardiogram 2016: Impressions:   - Normal LV systolic function; mild LVH; grade 2 diastolic    dysfunction; elevated LV filling pressure; moderate biatrial    enlargement; mild RVE; mild MR; moderate TR; mildly elevated    pulmonary pressure.   EKG:  EKG atrial fibrillation with right bundle with QRSD 148 ms.  Atrial fibs with slow ventricular response.  In comparison to February 18, 2021, no significant changes noted.  Recent Labs: No results found for requested labs within last 365 days.  Recent Lipid Panel    Component Value Date/Time   CHOL 81 12/05/2014 0427   TRIG 83 12/05/2014 0427   HDL 29 (L) 12/05/2014 0427   CHOLHDL 2.8 12/05/2014 0427   VLDL 17 12/05/2014 0427   LDLCALC 35 12/05/2014 0427    Physical Exam:    VS:  BP (!) 142/68   Pulse (!) 55   Ht '5\' 11"'$  (1.803 m)   Wt 170 lb 3.2 oz (77.2 kg)   SpO2 98%   BMI 23.74 kg/m     Wt Readings from Last 3 Encounters:  01/21/22 170 lb 3.2 oz (77.2 kg)  02/18/21 167 lb 6.4 oz (75.9 kg)  02/08/20 163 lb (73.9 kg)     GEN: Well-preserved, slender, and ambulatory. No acute distress HEENT: Normal NECK: No JVD. LYMPHATICS: No lymphadenopathy CARDIAC: Left lower sternal systolic murmur likely mitral regurgitation, without diastolic murmur. IIRR no gallop, or edema. VASCULAR:  Normal Pulses. No bruits. RESPIRATORY:  Clear to auscultation without rales, wheezing or rhonchi  ABDOMEN: Soft, non-tender, non-distended, No pulsatile mass, MUSCULOSKELETAL: No deformity  SKIN: Warm and  dry NEUROLOGIC:  Alert and oriented x 3 PSYCHIATRIC:  Normal affect   ASSESSMENT:    1. Atherosclerosis of coronary artery bypass graft of native heart with stable angina pectoris ([place])   2. Essential hypertension   3. Other hyperlipidemia   4. Paroxysmal atrial fibrillation (HCC)   5. Cryptogenic stroke ([place])   6. Chronic anticoagulation    PLAN:    In order of problems listed above:  Secondary prevention reviewed.  He does need to  continue high intensity statin therapy.  No aspirin in conjunction with Eliquis. [facility] is managing his blood pressure with recent changes including increasing losartan to 100 mg/day and HCTZ to 25 mg/day.  He continues on amlodipine 10 mg daily. Lipitor as noted Permanent atrial fibrillation with good rate control despite no medication.  Watch for bradycardia over time.  No syncope or events to this point.   Continue Eliquis therapy Monitor for changes in weight that may lead to alteration of Eliquis dose.  The [facility] has increased his dose to 5 mg twice daily  1 year follow-up with an assigned cardiologist.   Medication Adjustments/Labs and Tests Ordered: Current medicines are reviewed at length with the patient today.  Concerns regarding medicines are outlined above.  Orders Placed This Encounter  Procedures   EKG 12-Lead   No orders of the defined types were placed in this encounter.   Patient Instructions  Medication Instructions:  Your physician recommends that you continue on your current medications as directed. Please refer to the Current Medication list given to you today.  *If you need a refill on your cardiac medications before your next appointment, please call your pharmacy*  Lab Work: NONE  Testing/Procedures: NONE  Follow-Up: At [facility], you and your health needs are our priority.  As part of our continuing mission to provide you with exceptional heart care, we have created designated Provider Care Teams.  These Care  Teams include your primary Cardiologist (physician) and Advanced Practice Providers (APPs -  Physician Assistants and Nurse Practitioners) who all work together to provide you with the care you need, when you need it.  Your next appointment:   1 year(s)  The format for your next appointment:   In Person  Provider:   Sinclair Grooms, MD   Important Information About Sugar         Signed, Sinclair Grooms, MD  01/21/2022 5:29 PM    [place]

## 2022-01-21 ENCOUNTER — Ambulatory Visit: Payer: Medicare Other | Attending: Interventional Cardiology | Admitting: Interventional Cardiology

## 2022-01-21 ENCOUNTER — Encounter: Payer: Self-pay | Admitting: Interventional Cardiology

## 2022-01-21 VITALS — BP 142/68 | HR 55 | Ht 71.0 in | Wt 170.2 lb

## 2022-01-21 DIAGNOSIS — I639 Cerebral infarction, unspecified: Secondary | ICD-10-CM

## 2022-01-21 DIAGNOSIS — I25708 Atherosclerosis of coronary artery bypass graft(s), unspecified, with other forms of angina pectoris: Secondary | ICD-10-CM

## 2022-01-21 DIAGNOSIS — Z7901 Long term (current) use of anticoagulants: Secondary | ICD-10-CM

## 2022-01-21 DIAGNOSIS — E7849 Other hyperlipidemia: Secondary | ICD-10-CM

## 2022-01-21 DIAGNOSIS — I480 Paroxysmal atrial fibrillation: Secondary | ICD-10-CM | POA: Diagnosis not present

## 2022-01-21 DIAGNOSIS — I10 Essential (primary) hypertension: Secondary | ICD-10-CM

## 2022-01-21 NOTE — Patient Instructions (Signed)
Medication Instructions:  Your physician recommends that you continue on your current medications as directed. Please refer to the Current Medication list given to you today.  *If you need a refill on your cardiac medications before your next appointment, please call your pharmacy*  Lab Work: NONE  Testing/Procedures: NONE  Follow-Up: At [facility], you and your health needs are our priority.  As part of our continuing mission to provide you with exceptional heart care, we have created designated Provider Care Teams.  These Care Teams include your primary Cardiologist (physician) and Advanced Practice Providers (APPs -  Physician Assistants and Nurse Practitioners) who all work together to provide you with the care you need, when you need it.  Your next appointment:   1 year(s)  The format for your next appointment:   In Person  Provider:   Henry W Smith III, MD    Important Information About Sugar       

## 2022-02-10 DIAGNOSIS — H401131 Primary open-angle glaucoma, bilateral, mild stage: Secondary | ICD-10-CM | POA: Diagnosis not present

## 2022-02-15 DIAGNOSIS — H353221 Exudative age-related macular degeneration, left eye, with active choroidal neovascularization: Secondary | ICD-10-CM | POA: Diagnosis not present

## 2022-02-16 DIAGNOSIS — S81811A Laceration without foreign body, right lower leg, initial encounter: Secondary | ICD-10-CM | POA: Diagnosis not present

## 2022-02-16 DIAGNOSIS — Z23 Encounter for immunization: Secondary | ICD-10-CM | POA: Diagnosis not present

## 2022-02-18 DIAGNOSIS — L249 Irritant contact dermatitis, unspecified cause: Secondary | ICD-10-CM | POA: Diagnosis not present

## 2022-02-18 DIAGNOSIS — D225 Melanocytic nevi of trunk: Secondary | ICD-10-CM | POA: Diagnosis not present

## 2022-02-18 DIAGNOSIS — L821 Other seborrheic keratosis: Secondary | ICD-10-CM | POA: Diagnosis not present

## 2022-02-18 DIAGNOSIS — D485 Neoplasm of uncertain behavior of skin: Secondary | ICD-10-CM | POA: Diagnosis not present

## 2022-02-18 DIAGNOSIS — Z85828 Personal history of other malignant neoplasm of skin: Secondary | ICD-10-CM | POA: Diagnosis not present

## 2022-02-18 DIAGNOSIS — D2261 Melanocytic nevi of right upper limb, including shoulder: Secondary | ICD-10-CM | POA: Diagnosis not present

## 2022-02-18 DIAGNOSIS — C44329 Squamous cell carcinoma of skin of other parts of face: Secondary | ICD-10-CM | POA: Diagnosis not present

## 2022-02-22 ENCOUNTER — Other Ambulatory Visit: Payer: Self-pay

## 2022-02-22 ENCOUNTER — Emergency Department (HOSPITAL_BASED_OUTPATIENT_CLINIC_OR_DEPARTMENT_OTHER): Payer: Medicare Other | Admitting: Radiology

## 2022-02-22 ENCOUNTER — Encounter (HOSPITAL_BASED_OUTPATIENT_CLINIC_OR_DEPARTMENT_OTHER): Payer: Self-pay

## 2022-02-22 ENCOUNTER — Emergency Department (HOSPITAL_BASED_OUTPATIENT_CLINIC_OR_DEPARTMENT_OTHER)
Admission: EM | Admit: 2022-02-22 | Discharge: 2022-02-22 | Disposition: A | Discharge status: Home / Self Care | Payer: Medicare Other | Attending: Emergency Medicine | Admitting: Emergency Medicine

## 2022-02-22 DIAGNOSIS — I129 Hypertensive chronic kidney disease with stage 1 through stage 4 chronic kidney disease, or unspecified chronic kidney disease: Secondary | ICD-10-CM | POA: Diagnosis not present

## 2022-02-22 DIAGNOSIS — Z79899 Other long term (current) drug therapy: Secondary | ICD-10-CM | POA: Insufficient documentation

## 2022-02-22 DIAGNOSIS — Z7901 Long term (current) use of anticoagulants: Secondary | ICD-10-CM | POA: Insufficient documentation

## 2022-02-22 DIAGNOSIS — S81801A Unspecified open wound, right lower leg, initial encounter: Secondary | ICD-10-CM | POA: Diagnosis not present

## 2022-02-22 DIAGNOSIS — D649 Anemia, unspecified: Secondary | ICD-10-CM | POA: Insufficient documentation

## 2022-02-22 DIAGNOSIS — W01118A Fall on same level from slipping, tripping and stumbling with subsequent striking against other sharp object, initial encounter: Secondary | ICD-10-CM | POA: Diagnosis not present

## 2022-02-22 DIAGNOSIS — Z951 Presence of aortocoronary bypass graft: Secondary | ICD-10-CM | POA: Diagnosis not present

## 2022-02-22 DIAGNOSIS — N189 Chronic kidney disease, unspecified: Secondary | ICD-10-CM | POA: Diagnosis not present

## 2022-02-22 DIAGNOSIS — L03115 Cellulitis of right lower limb: Secondary | ICD-10-CM | POA: Diagnosis not present

## 2022-02-22 DIAGNOSIS — S8991XA Unspecified injury of right lower leg, initial encounter: Secondary | ICD-10-CM | POA: Diagnosis present

## 2022-02-22 LAB — CBC WITH DIFFERENTIAL/PLATELET
Abs Immature Granulocytes: 0.01 10*3/uL (ref 0.00–0.07)
Basophils Absolute: 0 10*3/uL (ref 0.0–0.1)
Basophils Relative: 0 %
Eosinophils Absolute: 0.4 10*3/uL (ref 0.0–0.5)
Eosinophils Relative: 5 %
HCT: 32.8 % — ABNORMAL LOW (ref 39.0–52.0)
Hemoglobin: 10.3 g/dL — ABNORMAL LOW (ref 13.0–17.0)
Immature Granulocytes: 0 %
Lymphocytes Relative: 19 %
Lymphs Abs: 1.4 10*3/uL (ref 0.7–4.0)
MCH: 29.3 pg (ref 26.0–34.0)
MCHC: 31.4 g/dL (ref 30.0–36.0)
MCV: 93.2 fL (ref 80.0–100.0)
Monocytes Absolute: 0.9 10*3/uL (ref 0.1–1.0)
Monocytes Relative: 12 %
Neutro Abs: 4.8 10*3/uL (ref 1.7–7.7)
Neutrophils Relative %: 64 %
Platelets: 249 10*3/uL (ref 150–400)
RBC: 3.52 MIL/uL — ABNORMAL LOW (ref 4.22–5.81)
RDW: 16.4 % — ABNORMAL HIGH (ref 11.5–15.5)
WBC: 7.5 10*3/uL (ref 4.0–10.5)
nRBC: 0 % (ref 0.0–0.2)

## 2022-02-22 LAB — COMPREHENSIVE METABOLIC PANEL
ALT: 9 U/L (ref 0–44)
AST: 15 U/L (ref 15–41)
Albumin: 3.9 g/dL (ref 3.5–5.0)
Alkaline Phosphatase: 123 U/L (ref 38–126)
Anion gap: 13 (ref 5–15)
BUN: 45 mg/dL — ABNORMAL HIGH (ref 8–23)
CO2: 22 mmol/L (ref 22–32)
Calcium: 8.8 mg/dL — ABNORMAL LOW (ref 8.9–10.3)
Chloride: 109 mmol/L (ref 98–111)
Creatinine, Ser: 1.62 mg/dL — ABNORMAL HIGH (ref 0.61–1.24)
GFR, Estimated: 38 mL/min — ABNORMAL LOW (ref >60)
Glucose, Bld: 149 mg/dL — ABNORMAL HIGH (ref 70–99)
Potassium: 4 mmol/L (ref 3.5–5.1)
Sodium: 144 mmol/L (ref 135–145)
Total Bilirubin: 1.1 mg/dL (ref 0.3–1.2)
Total Protein: 6.4 g/dL — ABNORMAL LOW (ref 6.5–8.1)

## 2022-02-22 LAB — LACTIC ACID, PLASMA: Lactic Acid, Venous: 1.4 mmol/L (ref 0.5–1.9)

## 2022-02-22 MED ORDER — BACITRACIN ZINC 500 UNIT/GM EX OINT: 1.0000 | TOPICAL_OINTMENT | Freq: Two times a day (BID) | CUTANEOUS | 0 refills | Status: DC

## 2022-02-22 MED ORDER — BACITRACIN ZINC 500 UNIT/GM EX OINT: TOPICAL_OINTMENT | Freq: Two times a day (BID) | CUTANEOUS | Status: DC

## 2022-02-22 MED ORDER — BACITRACIN ZINC 500 UNIT/GM EX OINT
TOPICAL_OINTMENT | Freq: Two times a day (BID) | CUTANEOUS | Status: DC
  Filled 2022-02-22: qty 28.35

## 2022-02-22 MED ORDER — DOXYCYCLINE HYCLATE 100 MG PO TABS
100.0000 mg | ORAL_TABLET | Freq: Once | ORAL | Status: AC
  Administered 2022-02-22: 100 mg via ORAL
  Filled 2022-02-22: qty 1

## 2022-02-22 MED ORDER — DOXYCYCLINE HYCLATE 100 MG PO CAPS: 100.0000 mg | ORAL_CAPSULE | Freq: Two times a day (BID) | ORAL | 0 refills | Status: AC

## 2022-02-22 NOTE — ED Triage Notes (Signed)
Pt states that approximately one week ago he tripped getting on the bus, injuring his R shin. Pt states that the wound has progressively gotten worse and has started weeping continuously. Pt denies fevers.   Pt lives at [facility], arrived POV.

## 2022-02-22 NOTE — ED Provider Notes (Signed)
[facility] EMERGENCY DEPT Provider Note   CSN: [id number] Arrival date & time: 02/22/22  1342     History  Chief Complaint  Patient presents with   Wound Check    [name] is a 88 y.o. male.  HPI 88 year old male with a history significant for paroxysmal A-fib, stroke, hypertension, and CABG presents with right leg wound.  He is concerned about infection.  Wife contributes to the history.  6 days ago he was getting into a bus and hit his shin on a step.  He developed a wound and went to urgent care but it did not seem to need repair.  Since then the wound has stayed open and had some clear drainage.  However since this morning he is developed some discomfort and a little bit of redness.  The drainage is mostly clear.  No fevers.  His right leg is always swollen compared to his left due to graft harvesting.  Home Medications Prior to Admission medications   Medication Sig Start Date End Date Taking? Authorizing Provider  bacitracin ointment Apply 1 Application topically 2 (two) times daily. 02/22/22  Yes Sherwood Gambler, MD  doxycycline (VIBRAMYCIN) 100 MG capsule Take 1 capsule (100 mg total) by mouth 2 (two) times daily for 5 days. 02/22/22 02/27/22 Yes Sherwood Gambler, MD  amLODipine (NORVASC) 10 MG tablet Take 10 mg by mouth daily.    [provider]  apixaban (ELIQUIS) 5 MG TABS tablet Take 5 mg by mouth 2 (two) times daily.    [provider]  atorvastatin (LIPITOR) 40 MG tablet Take 40 mg by mouth at bedtime.    [provider]  ezetimibe (ZETIA) 10 MG tablet Take 5 mg by mouth at bedtime.    [provider]  ferrous sulfate 325 (65 FE) MG tablet Take 325 mg by mouth daily with breakfast.    [provider]  finasteride (PROSCAR) 5 MG tablet Take 5 mg by mouth daily at 12 noon.    [provider]  hydrochlorothiazide (HYDRODIURIL) 25 MG tablet Take 25 mg by mouth daily.    [provider]   losartan (COZAAR) 100 MG tablet Take 1 tablet by mouth daily in the afternoon. 12/21/21   [provider]  Multiple Vitamin (MULTIVITAMIN WITH MINERALS) TABS tablet Take 1 tablet by mouth daily.    [provider]  Multiple Vitamins-Minerals (PRESERVISION AREDS 2 PO) Take 1 capsule by mouth 2 (two) times daily.     [provider]  pantoprazole (PROTONIX) 20 MG tablet Take 20 mg by mouth daily.    [provider]  traMADol (ULTRAM) 50 MG tablet Take 50 mg by mouth every 6 (six) hours as needed for moderate pain.     [provider]      Allergies    Monosodium glutamate and Lisinopril    Review of Systems   Review of Systems  Constitutional:  Negative for fever.  Musculoskeletal:  Positive for myalgias.  Skin:  Positive for color change and wound.    Physical Exam Updated Vital Signs BP (!) 158/54   Pulse (!) 46   Temp (!) 97.5 F (36.4 C) (Oral)   Resp 16   Ht '5\' 11"'$  (1.803 m)   Wt 77.1 kg   SpO2 98%   BMI 23.71 kg/m  Physical Exam Vitals and nursing note reviewed.  Constitutional:      Appearance: He is well-developed.  HENT:     Head: Normocephalic and atraumatic.  Pulmonary:     Effort: Pulmonary effort is normal.  Abdominal:     General: There is no distension.  Musculoskeletal:     Comments: See picture of right lower anterior leg.  There is some mild tenderness surrounding the wound but overall is mild and not consistent with deep space infection.  No significant drainage.  There was a small piece of dead skin that was pulled off.  Otherwise no fluctuance, induration.  Right foot is warm and well-perfused.  Skin:    General: Skin is warm and dry.  Neurological:     Mental Status: He is alert.     ED Results / Procedures / Treatments   Labs (all labs ordered are listed, but only abnormal results are displayed) Labs Reviewed  COMPREHENSIVE METABOLIC PANEL - Abnormal; Notable for the following components:       Result Value   Glucose, Bld 149 (*)    BUN 45 (*)    Creatinine, Ser 1.62 (*)    Calcium 8.8 (*)    Total Protein 6.4 (*)    GFR, Estimated 38 (*)    All other components within normal limits  CBC WITH DIFFERENTIAL/PLATELET - Abnormal; Notable for the following components:   RBC 3.52 (*)    Hemoglobin 10.3 (*)    HCT 32.8 (*)    RDW 16.4 (*)    All other components within normal limits  LACTIC ACID, PLASMA    EKG None  Radiology DG Tibia/Fibula Right  Result Date: 02/22/2022 CLINICAL DATA:  Right leg wound. EXAM: RIGHT TIBIA AND FIBULA - 2 VIEW COMPARISON:  None Available. FINDINGS: There is no evidence of fracture or other focal bone lesions. No lytic destruction is noted. Vascular calcifications are noted. IMPRESSION: No acute abnormality seen. Electronically Signed   By: Marijo Conception M.D.   On: 02/22/2022 14:45    Procedures Procedures    Medications Ordered in ED Medications  bacitracin ointment (has no administration in time range)  doxycycline (VIBRA-TABS) tablet 100 mg (has no administration in time range)    ED Course/ Medical Decision Making/ A&P                           Medical Decision Making Amount and/or Complexity of Data Reviewed Labs:     Details: Labs from triage show normal WBC, chronic anemia, chronic CKD.  Normal lactate. Radiology: independent interpretation performed.    Details: No acute fracture or obvious osteomyelitis on x-ray.  Risk OTC drugs. Prescription drug management.   Patient presents with what appears to be a wound infection.  There is no obvious abscess or deep space infection.  Labs obtained have been reviewed and are unremarkable.  The picture taken shows mostly the wound though it did not portray the mild erythema seen around the wound as well.  I think he is developing an infection and will be put on antibiotics.  Also advised some topical antibiotics with his dressing.  Otherwise, highly doubt sepsis or bacteremia.  Doubt  osteomyelitis.  Will discharge home with return precautions.        Final Clinical Impression(s) / ED Diagnoses Final diagnoses:  Leg wound, right, initial encounter  Cellulitis of right lower extremity    Rx / DC Orders ED Discharge Orders          Ordered    doxycycline (VIBRAMYCIN) 100 MG capsule  2 times daily        02/22/22  1616    bacitracin ointment  2 times daily        02/22/22 1617              Sherwood Gambler, MD 02/22/22 1622

## 2022-02-22 NOTE — Discharge Instructions (Addendum)
You are being put on an antibiotic to help treat your wound infection.  You may also apply topical antibiotic ointment, I prescribed 1 to you.  If he notices significant worsening of the redness, new or worsening pain, fevers, streaking up your leg, numbness or weakness in the leg, or any other new/concerning symptoms then return to the ER or call 911.

## 2022-02-26 ENCOUNTER — Telehealth: Payer: Self-pay

## 2022-02-26 NOTE — Telephone Encounter (Signed)
     Patient  visit on 11/13  at [facility]   Have you been able to follow up with your primary care physician? Yes   The patient was or was not able to obtain any needed medicine or equipment. Yes   Are there diet recommendations that you are having difficulty following? Na   Patient expresses understanding of discharge instructions and education provided has no other needs at this time.  Yes      [place], [facility], Care Management  [phone] [address], [place], [place] [address] Phone: [phone] Email: [email or web address]

## 2022-03-02 DIAGNOSIS — I10 Essential (primary) hypertension: Secondary | ICD-10-CM | POA: Diagnosis not present

## 2022-03-02 DIAGNOSIS — Z09 Encounter for follow-up examination after completed treatment for conditions other than malignant neoplasm: Secondary | ICD-10-CM | POA: Diagnosis not present

## 2022-03-02 DIAGNOSIS — S81801D Unspecified open wound, right lower leg, subsequent encounter: Secondary | ICD-10-CM | POA: Diagnosis not present

## 2022-03-09 ENCOUNTER — Encounter (HOSPITAL_BASED_OUTPATIENT_CLINIC_OR_DEPARTMENT_OTHER): Payer: Medicare Other | Admitting: General Surgery

## 2022-03-16 DIAGNOSIS — H353112 Nonexudative age-related macular degeneration, right eye, intermediate dry stage: Secondary | ICD-10-CM | POA: Diagnosis not present

## 2022-03-16 DIAGNOSIS — H353221 Exudative age-related macular degeneration, left eye, with active choroidal neovascularization: Secondary | ICD-10-CM | POA: Diagnosis not present

## 2022-03-16 DIAGNOSIS — H35453 Secondary pigmentary degeneration, bilateral: Secondary | ICD-10-CM | POA: Diagnosis not present

## 2022-03-16 DIAGNOSIS — Z961 Presence of intraocular lens: Secondary | ICD-10-CM | POA: Diagnosis not present

## 2022-03-16 DIAGNOSIS — H35363 Drusen (degenerative) of macula, bilateral: Secondary | ICD-10-CM | POA: Diagnosis not present

## 2022-03-19 ENCOUNTER — Encounter (HOSPITAL_BASED_OUTPATIENT_CLINIC_OR_DEPARTMENT_OTHER): Payer: Medicare Other | Attending: General Surgery | Admitting: General Surgery

## 2022-03-19 DIAGNOSIS — Z951 Presence of aortocoronary bypass graft: Secondary | ICD-10-CM | POA: Insufficient documentation

## 2022-03-19 DIAGNOSIS — N1831 Chronic kidney disease, stage 3a: Secondary | ICD-10-CM | POA: Diagnosis not present

## 2022-03-19 DIAGNOSIS — R600 Localized edema: Secondary | ICD-10-CM | POA: Diagnosis not present

## 2022-03-19 DIAGNOSIS — I5030 Unspecified diastolic (congestive) heart failure: Secondary | ICD-10-CM | POA: Diagnosis not present

## 2022-03-19 DIAGNOSIS — Z7901 Long term (current) use of anticoagulants: Secondary | ICD-10-CM | POA: Insufficient documentation

## 2022-03-19 DIAGNOSIS — I130 Hypertensive heart and chronic kidney disease with heart failure and stage 1 through stage 4 chronic kidney disease, or unspecified chronic kidney disease: Secondary | ICD-10-CM | POA: Insufficient documentation

## 2022-03-19 DIAGNOSIS — I480 Paroxysmal atrial fibrillation: Secondary | ICD-10-CM | POA: Insufficient documentation

## 2022-03-19 DIAGNOSIS — Z8673 Personal history of transient ischemic attack (TIA), and cerebral infarction without residual deficits: Secondary | ICD-10-CM | POA: Insufficient documentation

## 2022-03-19 DIAGNOSIS — S80811A Abrasion, right lower leg, initial encounter: Secondary | ICD-10-CM | POA: Diagnosis not present

## 2022-03-19 DIAGNOSIS — L97819 Non-pressure chronic ulcer of other part of right lower leg with unspecified severity: Secondary | ICD-10-CM | POA: Diagnosis not present

## 2022-03-19 NOTE — Progress Notes (Signed)
[name], [name] ([id number]) [id number] Page 1 of 8 Visit Report for 03/19/2022 Allergy List Details Patient Name: Date of Service: [name], [name] 03/19/2022 9:00 A M Medical Record Number: [id number] Patient Account Number: [id number] Date of Birth/Sex: Treating RN: December 02, 1922 (88 y.o. [name] Primary Care Merit Maybee: [name] Other Clinician: Referring Vaishnavi Dalby: Treating Tomie Elko/Extender: Merwyn Katos, Katie Weeks in Treatment: 0 Allergies Active Allergies lisinopril MSG Reaction: Monosodium Glutamate Severity: Moderate Type: Food Allergy Notes Electronic Signature(s) Signed: 03/19/2022 12:01:03 PM By: Dellie Catholic RN Entered By: Dellie Catholic on 03/19/2022 09:27:13 -------------------------------------------------------------------------------- Arrival Information Details Patient Name: Date of Service: [name] 03/19/2022 9:00 A M Medical Record Number: [id number] Patient Account Number: [id number] Date of Birth/Sex: Treating RN: [birth year] (88 y.o. [name] Primary Care Delayna Sparlin: [name] Other Clinician: Referring Arland Usery: Treating Omega Slager/Extender: Sela Hua in Treatment: 0 Visit Information Patient Arrived: Ambulatory Arrival Time: 09:22 Accompanied By: "partner" Transfer Assistance: None Patient Identification Verified: Yes Patient Has Alerts: Yes Patient Alerts: Patient on Blood Thinner Eliquis Electronic Signature(s) Signed: 03/19/2022 12:01:03 PM By: Dellie Catholic RN Entered By: Dellie Catholic on 03/19/2022 09:23:25 [name] ([id number]) [id number] Page 2 of 8 -------------------------------------------------------------------------------- Clinic Level of Care Assessment Details Patient Name: Date of Service: [name] 03/19/2022 9:00 A M Medical Record Number: [id number] Patient Account Number:  [id number] Date of Birth/Sex: Treating RN: [birth year] (88 y.o. [name] Primary Care Dietra Stokely: [name] Other Clinician: Referring Mitsuye Schrodt: Treating Tajuana Kniskern/Extender: Sela Hua in Treatment: 0 Clinic Level of Care Assessment Items TOOL 1 Quantity Score X- 1 0 Use when EandM and Procedure is performed on INITIAL visit ASSESSMENTS - Nursing Assessment / Reassessment X- 1 20 General Physical Exam (combine w/ comprehensive assessment (listed just below) when performed on new pt. evals) X- 1 25 Comprehensive Assessment (HX, ROS, Risk Assessments, Wounds Hx, etc.) ASSESSMENTS - Wound and Skin Assessment / Reassessment X- 1 10 Dermatologic / Skin Assessment (not related to wound area) ASSESSMENTS - Ostomy and/or Continence Assessment and Care '[]'$  - 0 Incontinence Assessment and Management '[]'$  - 0 Ostomy Care Assessment and Management (repouching, etc.) PROCESS - Coordination of Care X - Simple Patient / Family Education for ongoing care 1 15 '[]'$  - 0 Complex (extensive) Patient / Family Education for ongoing care X- 1 10 Staff obtains Programmer, systems, Records, T Results / Process Orders est X- 1 10 Staff telephones HHA, Nursing Homes / Clarify orders / etc '[]'$  - 0 Routine Transfer to another Facility (non-emergent condition) '[]'$  - 0 Routine Hospital Admission (non-emergent condition) X- 1 15 New Admissions / Biomedical engineer / Ordering NPWT Apligraf, etc. , '[]'$  - 0 Emergency Hospital Admission (emergent condition) PROCESS - Special Needs '[]'$  - 0 Pediatric / Minor Patient Management '[]'$  - 0 Isolation Patient Management '[]'$  - 0 Hearing / Language / Visual special needs '[]'$  - 0 Assessment of Community assistance (transportation, D/C planning, etc.) '[]'$  - 0 Additional assistance / Altered mentation '[]'$  - 0 Support Surface(s) Assessment (bed, cushion, seat, etc.) INTERVENTIONS - Miscellaneous '[]'$  - 0 External ear exam '[]'$  - 0 Patient Transfer  (multiple staff / Civil Service fast streamer / Similar devices) '[]'$  - 0 Simple Staple / Suture removal (25 or less) '[]'$  - 0 Complex Staple / Suture removal (26 or more) '[]'$  - 0 Hypo/Hyperglycemic Management (do not check if billed separately) '[]'$  - 0 Ankle / Brachial Index (ABI) - do not check if billed separately Has the  patient been seen at the hospital within the last three years: Yes Total Score: 105 Level Of Care: New/Established - Level 3 Electronic Signature(s) Signed: 03/19/2022 12:01:03 PM By: Dellie Catholic RN [name] ([id number]) PM By: Dellie Catholic RN [phone].pdf Page 3 of 8 Signed: 03/19/2022 12:01:03 Entered By: Dellie Catholic on 03/19/2022 10:22:08 -------------------------------------------------------------------------------- Encounter Discharge Information Details Patient Name: Date of Service: [name] 03/19/2022 9:00 A M Medical Record Number: [id number] Patient Account Number: [id number] Date of Birth/Sex: Treating RN: 07-Sep-1922 (88 y.o. [name] Primary Care Corianne Buccellato: [name] Other Clinician: Referring [name]: Treating [name]/Extender: Sela Hua in Treatment: 0 Encounter Discharge Information Items Post Procedure Vitals Discharge Condition: Stable Temperature (F): 98.3 Ambulatory Status: Ambulatory Pulse (bpm): 52 Discharge Destination: Home Respiratory Rate (breaths/min): 16 Transportation: Private Auto Blood Pressure (mmHg): 166/77 Accompanied By: Spouse Schedule Follow-up Appointment: Yes Clinical Summary of Care: Patient Declined Electronic Signature(s) Signed: 03/19/2022 12:01:03 PM By: Dellie Catholic RN Entered By: Dellie Catholic on 03/19/2022 12:00:15 -------------------------------------------------------------------------------- Lower Extremity Assessment Details Patient Name: Date of Service: [name] 03/19/2022 9:00 A M Medical Record Number:  [id number] Patient Account Number: [id number] Date of Birth/Sex: Treating RN: 12-02-1922 (88 y.o. [name] Primary Care Shanicka Oldenkamp: [name] Other Clinician: Referring Marifer Hurd: Treating Hani Patnode/Extender: [name] Weeks in Treatment: 0 Edema Assessment Assessed: [Left: No] [Right: No] [Left: Edema] [Right: :] Calf Left: Right: Point of Measurement: From Medial Instep 36 cm Ankle Left: Right: Point of Measurement: From Medial Instep 22 cm Vascular Assessment Pulses: Dorsalis Pedis Palpable: [Right:Yes] Posterior Tibial Palpable: [Right:Yes] Electronic Signature(s) Signed: 03/19/2022 12:01:03 PM By: Dellie Catholic RN Raechel Ache, Troutman ([id number]) PM By: Dellie Catholic RN [phone].pdf Page 4 of 8 Signed: 03/19/2022 12:01:03 Entered By: Dellie Catholic on 03/19/2022 09:57:05 -------------------------------------------------------------------------------- Multi Wound Chart Details Patient Name: Date of Service: [name], QAZI DO RE [name] 03/19/2022 9:00 A M Medical Record Number: [id number] Patient Account Number: [id number] Date of Birth/Sex: Treating RN: [birth year] (88 y.o. M) Primary Care Srijan Givan: [name] Other Clinician: Referring Adelynne Joerger: Treating Laurice Kimmons/Extender: Sela Hua in Treatment: 0 Vital Signs Height(in): 71 Pulse(bpm): Weight(lbs): 167 Blood Pressure(mmHg): Body Mass Index(BMI): 23.3 Temperature(F): Respiratory Rate(breaths/min): 16 [1:Photos:] [N/A:N/A] Right, Anterior Lower Leg N/A N/A Wound Location: Trauma N/A N/A Wounding Event: Abrasion N/A N/A Primary Etiology: Arrhythmia, Hypertension N/A N/A Comorbid History: 03/01/2022 N/A N/A Date Acquired: 0 N/A N/A Weeks of Treatment: Open N/A N/A Wound Status: No N/A N/A Wound Recurrence: 4x3x0.1 N/A N/A Measurements L x W x D (cm) 9.425 N/A N/A A (cm) : rea 0.942 N/A N/A Volume (cm) : Full Thickness Without  Exposed N/A N/A Classification: Support Structures Medium N/A N/A Exudate A mount: Serosanguineous N/A N/A Exudate Type: red, brown N/A N/A Exudate Color: Distinct, outline attached N/A N/A Wound Margin: Small (1-33%) N/A N/A Granulation A mount: Pink N/A N/A Granulation Quality: Large (67-100%) N/A N/A Necrotic A mount: Eschar, Adherent Slough N/A N/A Necrotic Tissue: Fat Layer (Subcutaneous Tissue): Yes N/A N/A Exposed Structures: None N/A N/A Epithelialization: Debridement - Excisional N/A N/A Debridement: Pre-procedure Verification/Time Out 10:12 N/A N/A Taken: Lidocaine 5% topical ointment N/A N/A Pain Control: Necrotic/Eschar, Subcutaneous, N/A N/A Tissue Debrided: Slough Skin/Subcutaneous Tissue N/A N/A Level: 12 N/A N/A Debridement A (sq cm): rea Curette N/A N/A Instrument: Minimum N/A N/A Bleeding: Pressure N/A N/A Hemostasis Achieved: 0 N/A N/A Procedural Pain: 0 N/A N/A Post Procedural Pain: Debridement Treatment Response: Procedure was tolerated well N/A  N/A Post Debridement Measurements L x 4x3x0.1 N/A N/A W x D (cm) 0.942 N/A N/A Post Debridement Volume: (cm) Scarring: Yes N/A N/A Periwound Skin Texture: No Abnormalities Noted N/A N/A Periwound Skin Moisture: [name], [name] ([id number]) [id number] Page 5 of 8 Hemosiderin Staining: Yes N/A N/A Periwound Skin Color: No Abnormality N/A N/A Temperature: Debridement N/A N/A Procedures Performed: Treatment Notes Electronic Signature(s) Signed: 03/19/2022 10:39:57 AM By: Fredirick Maudlin MD FACS Entered By: Fredirick Maudlin on 03/19/2022 10:39:56 -------------------------------------------------------------------------------- Multi-Disciplinary Care Plan Details Patient Name: Date of Service: [name] 03/19/2022 9:00 A M Medical Record Number: [id number] Patient Account Number: [id number] Date of Birth/Sex: Treating RN: Apr 02, 1923 (88 y.o. [name] Primary Care Mattson Dayal: [name] Other Clinician: Referring Fayette Gasner: Treating Amariana Mirando/Extender: Sela Hua in Treatment: 0 Active Inactive Pain, Acute or Chronic Nursing Diagnoses: Pain, acute or chronic: actual or potential Goals: Patient/caregiver will verbalize adequate pain control between visits Date Initiated: 03/19/2022 Target Resolution Date: 07/11/2022 Goal Status: Active Interventions: Encourage patient to take pain medications as prescribed Notes: Electronic Signature(s) Signed: 03/19/2022 12:01:03 PM By: Dellie Catholic RN Entered By: Dellie Catholic on 03/19/2022 10:19:59 -------------------------------------------------------------------------------- Pain Assessment Details Patient Name: Date of Service: [name] 03/19/2022 9:00 A M Medical Record Number: [id number] Patient Account Number: [id number] Date of Birth/Sex: Treating RN: [birth year] (88 y.o. [name] Primary Care Linn Clavin: [name] Other Clinician: Referring Yossi Hinchman: Treating Cortni Tays/Extender: Sela Hua in Treatment: 0 Active Problems Location of Pain Severity and Description of Pain Patient Has Paino Yes Site Locations Pain Location: [name], [name] ([id number]) [phone].pdf Page 6 of 8 Pain Location: Generalized Pain With Dressing Change: No Duration of the Pain. Constant / Intermittento Constant Rate the pain. Current Pain Level: 4 Worst Pain Level: 10 Least Pain Level: 4 Tolerable Pain Level: 4 Character of Pain Describe the Pain: Sharp, Stabbing Pain Management and Medication Current Pain Management: Medication: Yes Cold Application: No Rest: Yes Massage: No Activity: No T.Velez.N.S.: No Heat Application: No Leg drop or elevation: No Is the Current Pain Management Adequate: Adequate How does your wound impact your activities of daily livingo Sleep: No Bathing: No Appetite:  No Relationship With Others: No Bladder Continence: No Emotions: No Bowel Continence: No Work: No Toileting: No Drive: No Dressing: No Hobbies: No Electronic Signature(s) Signed: 03/19/2022 12:01:03 PM By: Dellie Catholic RN Entered By: Dellie Catholic on 03/19/2022 10:04:10 -------------------------------------------------------------------------------- Patient/Caregiver Education Details Patient Name: Date of Service: [name] 12/8/2023andnbsp9:00 A M Medical Record Number: [id number] Patient Account Number: [id number] Date of Birth/Gender: Treating RN: 01/30/1923 (88 y.o. [name] Primary Care Physician: [name] Other Clinician: Referring Physician: Treating Physician/Extender: Sela Hua in Treatment: 0 Education Assessment Education Provided To: Patient Education Topics Provided Pain: Methods: Explain/Verbal Responses: Return demonstration correctly Electronic Signature(s) Signed: 03/19/2022 12:01:03 PM By: Dellie Catholic RN Raechel Ache, [name] ([id number]) [id number] Page 7 of 8 Entered By: Dellie Catholic on 03/19/2022 10:20:25 -------------------------------------------------------------------------------- Wound Assessment Details Patient Name: Date of Service: [name], CANIZALEZ DO RE [name] 03/19/2022 9:00 A M Medical Record Number: [id number] Patient Account Number: [id number] Date of Birth/Sex: Treating RN: 12/04/1922 (88 y.o. [name] Primary Care Artemus Romanoff: [name] Other Clinician: Referring Deysha Cartier: Treating Anuradha Chabot/Extender: [name] Weeks in Treatment: 0 Wound Status Wound Number: 1 Primary Etiology: Abrasion Wound Location: Right, Anterior Lower Leg Wound Status: Open Wounding Event: Trauma Comorbid History: Arrhythmia, Hypertension Date Acquired: 03/01/2022 Weeks Of  Treatment: 0 Clustered Wound: No Photos Wound Measurements Length: (cm) 4 Width: (cm)  3 Depth: (cm) 0.1 Area: (cm) 9.425 Volume: (cm) 0.942 % Reduction in Area: % Reduction in Volume: Epithelialization: None Tunneling: No Undermining: No Wound Description Classification: Full Thickness Without Exposed Suppor Wound Margin: Distinct, outline attached Exudate Amount: Medium Exudate Type: Serosanguineous Exudate Color: red, brown t Structures Foul Odor After Cleansing: No Slough/Fibrino Yes Wound Bed Granulation Amount: Small (1-33%) Exposed Structure Granulation Quality: Pink Fat Layer (Subcutaneous Tissue) Exposed: Yes Necrotic Amount: Large (67-100%) Necrotic Quality: Eschar, Adherent Slough Periwound Skin Texture Texture Color No Abnormalities Noted: No No Abnormalities Noted: No Scarring: Yes Hemosiderin Staining: Yes Moisture Temperature / Pain No Abnormalities Noted: Yes Temperature: No Abnormality Treatment Notes Wound #1 (Lower Leg) Wound Laterality: Right, Anterior Cleanser [name], [name] ([id number]) [id number] Page 8 of 8 Peri-Wound Care Triamcinolone 15 (g) Discharge Instruction: Use triamcinolone 15 (g) as directed Sween Lotion (Moisturizing lotion) Discharge Instruction: Apply moisturizing lotion as directed Topical Primary Dressing IODOFLEX 0.9% Cadexomer Iodine Pad 4x6 cm Discharge Instruction: Apply to wound bed as instructed Secondary Dressing Zetuvit Plus 4x8 in Discharge Instruction: Apply over primary dressing as directed. Secured With Compression Wrap ThreePress (3 layer compression wrap) Discharge Instruction: Apply three layer compression as directed. Compression Stockings Add-Ons Electronic Signature(s) Signed: 03/19/2022 12:01:03 PM By: Dellie Catholic RN Entered By: Dellie Catholic on 03/19/2022 10:01:44 -------------------------------------------------------------------------------- Vitals Details Patient Name: Date of Service: [name] 03/19/2022 9:00 A M Medical Record  Number: [id number] Patient Account Number: [id number] Date of Birth/Sex: Treating RN: Jun 09, 1922 (88 y.o. [name] Primary Care Cheryllynn Sarff: [name] Other Clinician: Referring Aidynn Polendo: Treating Kariann Wecker/Extender: Sela Hua in Treatment: 0 Vital Signs Time Taken: 09:16 Respiratory Rate (breaths/min): 16 Height (in): 71 Reference Range: 80 - 120 mg / dl Weight (lbs): 167 Body Mass Index (BMI): 23.3 Electronic Signature(s) Signed: 03/19/2022 12:01:03 PM By: Dellie Catholic RN Entered By: Dellie Catholic on 03/19/2022 09:24:10

## 2022-03-19 NOTE — Progress Notes (Signed)
[name], [name] ([id number]) [phone] [id number] Page 1 of 4 Visit Report for 03/19/2022 Abuse Risk Screen Details Patient Name: Date of Service: [name], [name] 03/19/2022 9:00 A M Medical Record Number: [id number] Patient Account Number: [id number] Date of Birth/Sex: Treating RN: 03-17-1923 (88 y.o. [name] Primary Care Winta Barcelo: [name] Other Clinician: Referring Marcelyn Ruppe: Treating Eyan Hagood/Extender: Sela Hua in Treatment: 0 Abuse Risk Screen Items Answer ABUSE RISK SCREEN: Has anyone close to you tried to hurt or harm you recentlyo No Do you feel uncomfortable with anyone in your familyo No Has anyone forced you do things that you didnt want to doo No Electronic Signature(s) Signed: 03/19/2022 12:01:03 PM By: Dellie Catholic RN Entered By: Dellie Catholic on 03/19/2022 09:40:07 -------------------------------------------------------------------------------- Activities of Daily Living Details Patient Name: Date of Service: [name], [name] 03/19/2022 9:00 A M Medical Record Number: [id number] Patient Account Number: [id number] Date of Birth/Sex: Treating RN: 27-Jan-1923 (88 y.o. [name] Primary Care Karita Dralle: [name] Other Clinician: Referring Adabella Stanis: Treating Stevens Magwood/Extender: Sela Hua in Treatment: 0 Activities of Daily Living Items Answer Activities of Daily Living (Please select one for each item) Drive Automobile Completely Able T Medications ake Completely Able Use T elephone Completely Able Care for Appearance Completely Able Use T oilet Completely Able Bath / Shower Completely Able Dress Self Completely Able Feed Self Completely Able Walk Completely Able Get In / Out Bed Completely Able Housework Completely Able Prepare Meals Completely [place] for Self Completely Able Electronic Signature(s) Signed: 03/19/2022  12:01:03 PM By: Dellie Catholic RN Entered By: Dellie Catholic on 03/19/2022 09:40:45 [name], [name] ([id number]) [id number] Page 2 of 4 -------------------------------------------------------------------------------- Education Screening Details Patient Name: Date of Service: [name] 03/19/2022 9:00 A M Medical Record Number: [id number] Patient Account Number: [id number] Date of Birth/Sex: Treating RN: 1922-11-06 (88 y.o. [name] Primary Care Tirso Laws: [name] Other Clinician: Referring Sterlin Knightly: Treating Giovoni Bunch/Extender: Sela Hua in Treatment: 0 Learning Preferences/Education Level/Primary Language Learning Preference: Explanation, Demonstration, Printed Material Highest Education Level: College or Above Preferred Language: English Cognitive Barrier Language Barrier: No Translator Needed: No Memory Deficit: No Emotional Barrier: No Cultural/Religious Beliefs Affecting Medical Care: No Physical Barrier Impaired Vision: No Impaired Hearing: No Decreased Hand dexterity: No Knowledge/Comprehension Knowledge Level: High Comprehension Level: High Ability to understand written instructions: High Ability to understand verbal instructions: High Motivation Anxiety Level: Calm Cooperation: Cooperative Education Importance: Acknowledges Need Interest in Health Problems: Asks Questions Perception: Coherent Willingness to Engage in Self-Management High Activities: Readiness to Engage in Self-Management High Activities: Electronic Signature(s) Signed: 03/19/2022 12:01:03 PM By: Dellie Catholic RN Entered By: Dellie Catholic on 03/19/2022 09:41:10 -------------------------------------------------------------------------------- Fall Risk Assessment Details Patient Name: Date of Service: [name] 03/19/2022 9:00 A M Medical Record Number: [id number] Patient Account Number:  [id number] Date of Birth/Sex: Treating RN: [birth year] (88 y.o. [name] Primary Care Shakala Marlatt: [name] Other Clinician: Referring Tavon Corriher: Treating Addaleigh Nicholls/Extender: Sela Hua in Treatment: 0 Fall Risk Assessment Items Have you had 2 or more falls in the last 12 monthso 0 No Have you had any fall that resulted in injury in the last 12 monthso 0 No [name], [name] ([id number]) [id number] Page 3 of 4 FALLS RISK SCREEN History of falling - immediate or within 3 months 0 No Secondary diagnosis (Do you have 2 or more medical diagnoseso) 0 No Ambulatory  aid None/bed rest/wheelchair/nurse 0 No Crutches/cane/walker 0 No Furniture 0 No Intravenous therapy Access/Saline/Heparin Lock 0 No Gait/Transferring Normal/ bed rest/ wheelchair 0 No Weak (short steps with or without shuffle, stooped but able to lift head while walking, may seek 0 No support from furniture) Impaired (short steps with shuffle, may have difficulty arising from chair, head down, impaired 0 No balance) Mental Status Oriented to own ability 0 No Electronic Signature(s) Signed: 03/19/2022 12:01:03 PM By: Dellie Catholic RN Entered By: Dellie Catholic on 03/19/2022 09:41:15 -------------------------------------------------------------------------------- Foot Assessment Details Patient Name: Date of Service: [name] 03/19/2022 9:00 A M Medical Record Number: [id number] Patient Account Number: [id number] Date of Birth/Sex: Treating RN: Aug 04, 1922 (88 y.o. [name] Primary Care Alfretta Pinch: [name] Other Clinician: Referring Akito Boomhower: Treating Merril Isakson/Extender: Sela Hua in Treatment: 0 Foot Assessment Items Site Locations + = Sensation present, - = Sensation absent, C = Callus, U = Ulcer R = Redness, W = Warmth, M = Maceration, PU = Pre-ulcerative lesion F = Fissure, S = Swelling, D =  Dryness Assessment Right: Left: Other Deformity: No No Prior Foot Ulcer: No No Prior Amputation: No No Charcot Joint: No No Ambulatory Status: Ambulatory Without Help GaitOMARION, [name] ([id number]) [phone] [id number] Page 4 of 4 Electronic Signature(s) Signed: 03/19/2022 12:01:03 PM By: Dellie Catholic RN Entered By: Dellie Catholic on 03/19/2022 09:56:45 -------------------------------------------------------------------------------- Nutrition Risk Screening Details Patient Name: Date of Service: [name], PLESS DO RE [name] 03/19/2022 9:00 A M Medical Record Number: [id number] Patient Account Number: [id number] Date of Birth/Sex: Treating RN: [birth year] (88 y.o. [name] Primary Care Zyla Dascenzo: [name] Other Clinician: Referring Jovonta Levit: Treating Kadince Boxley/Extender: Sela Hua in Treatment: 0 Height (in): 71 Weight (lbs): 167 Body Mass Index (BMI): 23.3 Nutrition Risk Screening Items Score Screening NUTRITION RISK SCREEN: I have an illness or condition that made me change the kind and/or amount of food I eat 0 No I eat fewer than two meals per day 0 No I eat few fruits and vegetables, or milk products 0 No I have three or more drinks of beer, liquor or wine almost every day 0 No I have tooth or mouth problems that make it hard for me to eat 0 No I don't always have enough money to buy the food I need 0 No I eat alone most of the time 0 No I take three or more different prescribed or over-the-counter drugs a day 0 No Without wanting to, I have lost or gained 10 pounds in the last six months 0 No I am not always physically able to shop, cook and/or feed myself 0 No Nutrition Protocols Good Risk Protocol 0 No interventions needed Moderate Risk Protocol High Risk Proctocol Risk Level: Good Risk Score: 0 Electronic Signature(s) Signed: 03/19/2022 12:01:03 PM By: Dellie Catholic RN Entered By: Dellie Catholic on  03/19/2022 09:41:25

## 2022-03-19 NOTE — Progress Notes (Addendum)
MUHAMED, LUECKE ([id number]) [id number] Page 1 of 9 Visit Report for 03/19/2022 Chief Complaint Document Details Patient Name: Date of Service: [name], [name] 03/19/2022 9:00 A M Medical Record Number: [id number] Patient Account Number: [id number] Date of Birth/Sex: Treating RN: 1923/02/23 (88 y.o. M) Primary Care Provider: [name] Other Clinician: Referring Provider: Treating Provider/Extender: Sela Hua in Treatment: 0 Information Obtained from: Patient Chief Complaint Patient seen for complaints of Non-Healing Wound. Electronic Signature(s) Signed: 03/19/2022 10:40:03 AM By: Fredirick Maudlin MD FACS Previous Signature: 03/19/2022 9:08:20 AM Version By: Fredirick Maudlin MD FACS Entered By: Fredirick Maudlin on 03/19/2022 10:40:03 -------------------------------------------------------------------------------- Debridement Details Patient Name: Date of Service: [name] Cliche DO RE E. 03/19/2022 9:00 A M Medical Record Number: [id number] Patient Account Number: [id number] Date of Birth/Sex: Treating RN: 07-24-1922 (88 y.o. [name] Primary Care Provider: [name] Other Clinician: Referring Provider: Treating Provider/Extender: Sela Hua in Treatment: 0 Debridement Performed for Assessment: Wound #1 Right,Anterior Lower Leg Performed By: Physician Fredirick Maudlin, MD Debridement Type: Debridement Level of Consciousness (Pre-procedure): Awake and Alert Pre-procedure Verification/Time Out Yes - 10:12 Taken: Start Time: 10:12 Pain Control: Lidocaine 5% topical ointment T Area Debrided (L x W): otal 4 (cm) x 3 (cm) = 12 (cm) Tissue and other material debrided: Non-Viable, Eschar, Slough, Subcutaneous, Slough Level: Skin/Subcutaneous Tissue Debridement Description: Excisional Instrument: Curette Bleeding: Minimum Hemostasis Achieved: Pressure End Time: 10:14 Procedural Pain: 0 Post  Procedural Pain: 0 Response to Treatment: Procedure was tolerated well Level of Consciousness (Post- Awake and Alert procedure): Post Debridement Measurements of Total Wound Length: (cm) 4 Width: (cm) 3 Depth: (cm) 0.1 Volume: (cm) 0.942 Character of Wound/Ulcer Post Debridement: Improved [name], MULLENBACH E ([id number]) [id number] Page 2 of 9 Post Procedure Diagnosis Same as Pre-procedure Notes Scribed for Dr. Celine Ahr by J.Scotton Electronic Signature(s) Signed: 03/19/2022 12:01:03 PM By: Dellie Catholic RN Signed: 03/19/2022 12:03:59 PM By: Fredirick Maudlin MD FACS Entered By: Dellie Catholic on 03/19/2022 10:23:12 -------------------------------------------------------------------------------- HPI Details Patient Name: Date of Service: [name] Cliche DO RE E. 03/19/2022 9:00 A M Medical Record Number: [id number] Patient Account Number: [id number] Date of Birth/Sex: Treating RN: 1922/11/22 (88 y.o. M) Primary Care Provider: [name] Other Clinician: Referring Provider: Treating Provider/Extender: Sela Hua in Treatment: 0 History of Present Illness HPI Description: ADMISSION 03/19/2022 This is a 88 year old man with a past medical history significant for coronary artery bypass with saphenous vein harvest from the right leg resulting in chronic edema, hypertension, stroke, paroxysmal atrial fibrillation on chronic anticoagulation, CHF, and stage III chronic kidney disease.. Near the beginning of November, he struck his leg on a step while getting into a bus. He developed a wound and went to urgent care but it did not seem to need any repair. It stayed open and he developed some discomfort and redness. There was some serous drainage and he presented to the emergency room on November 13. He was prescribed doxycycline and given bacitracin due to concern for infection. His PCP saw him on November 21 and recommended moist gauze dressing  changes with Xeroform. He is accompanied today by his significant other who says she has been dressing it with various topical agents including the mupirocin that was prescribed in the emergency room as well as Vaseline and a T pad. He was unable to tolerate ABIs today. elfa On his right anterior tibial surface, there is a wound that shows evidence of prior healing. There is a yellow dry eschar over  the entire surface. Underneath this, there is some nonviable subcutaneous tissue and slough. He has 1+ nonpitting edema in the leg. Electronic Signature(s) Signed: 03/19/2022 10:41:20 AM By: Fredirick Maudlin MD FACS Previous Signature: 03/19/2022 9:12:03 AM Version By: Fredirick Maudlin MD FACS Entered By: Fredirick Maudlin on 03/19/2022 10:41:19 -------------------------------------------------------------------------------- Physical Exam Details Patient Name: Date of Service: [name] Cliche DO RE E. 03/19/2022 9:00 A M Medical Record Number: [id number] Patient Account Number: [id number] Date of Birth/Sex: Treating RN: [birth year] (88 y.o. M) Primary Care Provider: [name] Other Clinician: Referring Provider: Treating Provider/Extender: Merwyn Katos, Katie Weeks in Treatment: 0 Constitutional . No acute distress. Respiratory Normal work of breathing on room air.. Notes 03/19/2022: On his right anterior tibial surface, there is a wound that shows evidence of prior healing. There is a yellow dry eschar over the entire surface. KHASIR, WOODROME ([id number]) [id number] Page 3 of 9 Underneath this, there is some nonviable subcutaneous tissue and slough. He has 1+ nonpitting edema in the leg. Electronic Signature(s) Signed: 03/19/2022 10:41:52 AM By: Fredirick Maudlin MD FACS Entered By: Fredirick Maudlin on 03/19/2022 10:41:52 -------------------------------------------------------------------------------- Physician Orders Details Patient Name: Date of  Service: [name] Cliche DO RE E. 03/19/2022 9:00 A M Medical Record Number: [id number] Patient Account Number: [id number] Date of Birth/Sex: Treating RN: 24-Oct-1922 (88 y.o. [name] Primary Care Provider: [name] Other Clinician: Referring Provider: Treating Provider/Extender: Sela Hua in Treatment: 0 Verbal / Phone Orders: No Diagnosis Coding ICD-10 Coding Code Description L97.819 Non-pressure chronic ulcer of other part of right lower leg with unspecified severity I10 Essential (primary) hypertension R60.0 Localized edema N18.31 Chronic kidney disease, stage 3a I50.30 Unspecified diastolic (congestive) heart failure Follow-up Appointments ppointment in 1 week. - Dr. Celine Ahr / Dr. Dellia Nims Room 3 Return A Anesthetic (In clinic) Topical Lidocaine 5% applied to wound bed - Used in Clinic Bathing/ Shower/ Hygiene May shower with protection but do not get wound dressing(s) wet. - Please do not get Right Leg wet (with the compression wraps). If showering please use a cast protector. Can purchase from Medical supply store, [place], [facility], [facility] etc. Or just sponge bathe until next week. Edema Control - Lymphedema / SCD / Other void standing for long periods of time. - Elevate the legs throughout the day. A Wound Treatment Wound #1 - Lower Leg Wound Laterality: Right, Anterior Peri-Wound Care: Triamcinolone 15 (g) 1 x Per Week/30 Days Discharge Instructions: Use triamcinolone 15 (g) as directed Peri-Wound Care: Sween Lotion (Moisturizing lotion) 1 x Per Week/30 Days Discharge Instructions: Apply moisturizing lotion as directed Prim Dressing: IODOFLEX 0.9% Cadexomer Iodine Pad 4x6 cm 1 x Per Week/30 Days ary Discharge Instructions: Apply to wound bed as instructed Secondary Dressing: Zetuvit Plus 4x8 in 1 x Per Week/30 Days Discharge Instructions: Apply over primary dressing as directed. Compression Wrap: ThreePress (3 layer compression wrap) 1 x Per  Week/30 Days Discharge Instructions: Apply three layer compression as directed. Electronic Signature(s) Signed: 03/19/2022 12:03:59 PM By: Fredirick Maudlin MD FACS Entered By: Fredirick Maudlin on 03/19/2022 10:42:15 [name] ([id number]) [id number] Page 4 of 9 -------------------------------------------------------------------------------- Problem List Details Patient Name: Date of Service: [name], ONNEN DO RE E. 03/19/2022 9:00 A M Medical Record Number: [id number] Patient Account Number: [id number] Date of Birth/Sex: Treating RN: 1922-11-17 (88 y.o. M) Primary Care Provider: [name] Other Clinician: Referring Provider: Treating Provider/Extender: Sela Hua in Treatment: 0 Active Problems ICD-10 Encounter Code Description Active Date MDM Diagnosis L97.812 Non-pressure chronic ulcer of  other part of right lower leg with fat layer 03/19/2022 No Yes exposed [place] (primary) hypertension 03/19/2022 No Yes R60.0 Localized edema 03/19/2022 No Yes N18.31 Chronic kidney disease, stage 3a 03/19/2022 No Yes I50.30 Unspecified diastolic (congestive) heart failure 03/19/2022 No Yes Inactive Problems Resolved Problems Electronic Signature(s) Signed: 03/19/2022 10:39:51 AM By: Fredirick Maudlin MD FACS Previous Signature: 03/19/2022 9:13:58 AM Version By: Fredirick Maudlin MD FACS Previous Signature: 03/19/2022 9:08:09 AM Version By: Fredirick Maudlin MD FACS Entered By: Fredirick Maudlin on 03/19/2022 10:39:51 -------------------------------------------------------------------------------- Progress Note Details Patient Name: Date of Service: [name] Cliche DO RE E. 03/19/2022 9:00 A M Medical Record Number: [id number] Patient Account Number: [id number] Date of Birth/Sex: Treating RN: January 06, 1923 (88 y.o. M) Primary Care Provider: [name] Other Clinician: Referring Provider: Treating Provider/Extender: Sela Hua in Treatment: 0 Subjective Chief Complaint Information obtained from Patient Patient seen for complaints of Non-Healing Wound. BLAIDEN, WERTH ([id number]) [id number] Page 5 of 9 History of Present Illness (HPI) ADMISSION 03/19/2022 This is a 88 year old man with a past medical history significant for coronary artery bypass with saphenous vein harvest from the right leg resulting in chronic edema, hypertension, stroke, paroxysmal atrial fibrillation on chronic anticoagulation, CHF, and stage III chronic kidney disease.. Near the beginning of November, he struck his leg on a step while getting into a bus. He developed a wound and went to urgent care but it did not seem to need any repair. It stayed open and he developed some discomfort and redness. There was some serous drainage and he presented to the emergency room on November 13. He was prescribed doxycycline and given bacitracin due to concern for infection. His PCP saw him on November 21 and recommended moist gauze dressing changes with Xeroform. He is accompanied today by his significant other who says she has been dressing it with various topical agents including the mupirocin that was prescribed in the emergency room as well as Vaseline and a T pad. He was unable to tolerate ABIs today. elfa On his right anterior tibial surface, there is a wound that shows evidence of prior healing. There is a yellow dry eschar over the entire surface. Underneath this, there is some nonviable subcutaneous tissue and slough. He has 1+ nonpitting edema in the leg. Patient History Information obtained from Patient. Allergies lisinopril, MSG (Severity: Moderate, Reaction: Monosodium Glutamate) Social History Never smoker, Marital Status - Married, Alcohol Use - Rarely, Drug Use - No History, Caffeine Use - Daily. Medical History Cardiovascular Patient has history of Arrhythmia - Hx: RBBB; Paroxysmal atrial Fib.,  Hypertension Hospitalization/Surgery History - T without cardioversion ; Hernia repair;2000 cardiac Bypass. ee Medical A Surgical History Notes nd Eyes Macular degeneration in Left eye Cardiovascular Hx: CASH with CABG 2000 with LIMA to LAD,SV graft. Hx: Hyperlipidemia Hx: stroke Gastrointestinal GERD Genitourinary Hx: BPH Review of Systems (ROS) Constitutional Symptoms (General Health) Denies complaints or symptoms of Fatigue, Fever, Chills, Marked Weight Change. Ear/Nose/Mouth/Throat Denies complaints or symptoms of Chronic sinus problems or rhinitis. Respiratory Denies complaints or symptoms of Chronic or frequent coughs, Shortness of Breath. Endocrine Denies complaints or symptoms of Heat/cold intolerance. Integumentary (Skin) Complains or has symptoms of Wounds - Right Anterior LL. Neurologic Denies complaints or symptoms of Numbness/parasthesias. Psychiatric Denies complaints or symptoms of Claustrophobia. Objective Constitutional No acute distress. Vitals Time Taken: 9:16 AM, Height: 71 in, Weight: 167 lbs, BMI: 23.3, Respiratory Rate: 16 breaths/min. Respiratory Normal work of breathing on room air.. General Notes: 03/19/2022: On his right anterior  tibial surface, there is a wound that shows evidence of prior healing. There is a yellow dry eschar over the entire surface. Underneath this, there is some nonviable subcutaneous tissue and slough. He has 1+ nonpitting edema in the leg. Integumentary (Hair, Skin) Wound #1 status is Open. Original cause of wound was Trauma. The date acquired was: 03/01/2022. The wound is located on the Right,Anterior Lower Leg. The wound measures 4cm length x 3cm width x 0.1cm depth; 9.425cm^2 area and 0.942cm^3 volume. There is Fat Layer (Subcutaneous Tissue) exposed. There is no tunneling or undermining noted. There is a medium amount of serosanguineous drainage noted. The wound margin is distinct with the outline attached to the wound base.  There is small (1-33%) pink granulation within the wound bed. There is a large (67-100%) amount of necrotic tissue within the wound bed including Eschar and Adherent Slough. The periwound skin appearance had no abnormalities noted for moisture. The periwound skin appearance exhibited: Scarring, Hemosiderin Staining. Periwound temperature was noted as No Abnormality. DAMIANO, STAMPER ([id number]) [id number] Page 6 of 9 Assessment Active Problems ICD-10 Non-pressure chronic ulcer of other part of right lower leg with fat layer exposed Essential (primary) hypertension Localized edema Chronic kidney disease, stage 3a Unspecified diastolic (congestive) heart failure Procedures Wound #1 Pre-procedure diagnosis of Wound #1 is an Abrasion located on the Right,Anterior Lower Leg . There was a Excisional Skin/Subcutaneous Tissue Debridement with a total area of 12 sq cm performed by Fredirick Maudlin, MD. With the following instrument(s): Curette to remove Non-Viable tissue/material. Material removed includes Eschar, Subcutaneous Tissue, and Slough after achieving pain control using Lidocaine 5% topical ointment. No specimens were taken. A time out was conducted at 10:12, prior to the start of the procedure. A Minimum amount of bleeding was controlled with Pressure. The procedure was tolerated well with a pain level of 0 throughout and a pain level of 0 following the procedure. Post Debridement Measurements: 4cm length x 3cm width x 0.1cm depth; 0.942cm^3 volume. Character of Wound/Ulcer Post Debridement is improved. Post procedure Diagnosis Wound #1: Same as Pre-Procedure General Notes: Scribed for Dr. Celine Ahr by J.Scotton. Plan Follow-up Appointments: Return Appointment in 1 week. - Dr. Celine Ahr / Dr. Dellia Nims Room 3 Anesthetic: (In clinic) Topical Lidocaine 5% applied to wound bed - Used in Clinic Bathing/ Shower/ Hygiene: May shower with protection but do not get wound  dressing(s) wet. - Please do not get Right Leg wet (with the compression wraps). If showering please use a cast protector. Can purchase from Medical supply store, [place], [facility], [facility] etc. Or just sponge bathe until next week. Edema Control - Lymphedema / SCD / Other: Avoid standing for long periods of time. - Elevate the legs throughout the day. WOUND #1: - Lower Leg Wound Laterality: Right, Anterior Peri-Wound Care: Triamcinolone 15 (g) 1 x Per Week/30 Days Discharge Instructions: Use triamcinolone 15 (g) as directed Peri-Wound Care: Sween Lotion (Moisturizing lotion) 1 x Per Week/30 Days Discharge Instructions: Apply moisturizing lotion as directed Prim Dressing: IODOFLEX 0.9% Cadexomer Iodine Pad 4x6 cm 1 x Per Week/30 Days ary Discharge Instructions: Apply to wound bed as instructed Secondary Dressing: Zetuvit Plus 4x8 in 1 x Per Week/30 Days Discharge Instructions: Apply over primary dressing as directed. Com pression Wrap: ThreePress (3 layer compression wrap) 1 x Per Week/30 Days Discharge Instructions: Apply three layer compression as directed. 03/19/2022: On his right anterior tibial surface, there is a wound that shows evidence of prior healing. There is a yellow dry eschar over the  entire surface. Underneath this, there is some nonviable subcutaneous tissue and slough. He has 1+ nonpitting edema in the leg. I used a curette to debride eschar, slough, and nonviable subcutaneous tissue from the wound. I think the wound would benefit from additional chemical debridement so we will apply Iodoflex. 3 layer compression. We discussed elevating his leg throughout the day and at night when he sleeps. Follow-up in 1 week. Electronic Signature(s) Signed: 03/19/2022 10:43:06 AM By: Fredirick Maudlin MD FACS Entered By: Fredirick Maudlin on 03/19/2022 10:43:06 HxROS Details -------------------------------------------------------------------------------- [name] ([id number])  [id number] Page 7 of 9 Patient Name: Date of Service: [name], BERLING DO RE E. 03/19/2022 9:00 A M Medical Record Number: [id number] Patient Account Number: [id number] Date of Birth/Sex: Treating RN: 04-30-1922 (88 y.o. [name] Primary Care Provider: [name] Other Clinician: Referring Provider: Treating Provider/Extender: Sela Hua in Treatment: 0 Information Obtained From Patient Constitutional Symptoms (General Health) Complaints and Symptoms: Negative for: Fatigue; Fever; Chills; Marked Weight Change Ear/Nose/Mouth/Throat Complaints and Symptoms: Negative for: Chronic sinus problems or rhinitis Respiratory Complaints and Symptoms: Negative for: Chronic or frequent coughs; Shortness of Breath Endocrine Complaints and Symptoms: Negative for: Heat/cold intolerance Integumentary (Skin) Complaints and Symptoms: Positive for: Wounds - Right Anterior LL Neurologic Complaints and Symptoms: Negative for: Numbness/parasthesias Psychiatric Complaints and Symptoms: Negative for: Claustrophobia Eyes Medical History: Past Medical History Notes: Macular degeneration in Left eye Hematologic/Lymphatic Cardiovascular Medical History: Positive for: Arrhythmia - Hx: RBBB; Paroxysmal atrial Fib.; Hypertension Past Medical History Notes: Hx: CASH with CABG 2000 with LIMA to LAD,SV graft. Hx: Hyperlipidemia Hx: stroke Gastrointestinal Medical History: Past Medical History Notes: GERD Genitourinary Medical History: Past Medical History Notes: Hx: BPH Oncologic Immunizations CARNELIUS, HAMMITT ([id number]) [id number] Page 8 of 9 Pneumococcal Vaccine: Received Pneumococcal Vaccination: Yes Received Pneumococcal Vaccination On or After 60th Birthday: Yes Implantable Devices None Hospitalization / Surgery History Type of Hospitalization/Surgery T without cardioversion ; Hernia repair;2000  cardiac Bypass ee Family and Social History Never smoker; Marital Status - Married; Alcohol Use: Rarely; Drug Use: No History; Caffeine Use: Daily; Financial Concerns: No; Food, Clothing or Shelter Needs: No; Support System Lacking: No; Transportation Concerns: No Electronic Signature(s) Signed: 03/19/2022 12:01:03 PM By: Dellie Catholic RN Signed: 03/19/2022 12:03:59 PM By: Fredirick Maudlin MD FACS Entered By: Dellie Catholic on 03/19/2022 09:40:00 -------------------------------------------------------------------------------- SuperBill Details Patient Name: Date of Service: [name] Cliche DO RE E. 03/19/2022 Medical Record Number: [id number] Patient Account Number: [id number] Date of Birth/Sex: Treating RN: 1922-11-22 (88 y.o. M) Primary Care Provider: [name] Other Clinician: Referring Provider: Treating Provider/Extender: Sela Hua in Treatment: 0 Diagnosis Coding ICD-10 Codes Code Description [phone] Non-pressure chronic ulcer of other part of right lower leg with fat layer exposed I10 Essential (primary) hypertension R60.0 Localized edema N18.31 Chronic kidney disease, stage 3a I50.30 Unspecified diastolic (congestive) heart failure Facility Procedures : CPT4 Code: [id number] Description: 99213 - WOUND CARE VISIT-LEV 3 EST PT Modifier: 25 Quantity: 1 : CPT4 Code: [id number] Description: 11042 - DEB SUBQ TISSUE 20 SQ CM/< ICD-10 Diagnosis Description L97.812 Non-pressure chronic ulcer of other part of right lower leg with fat layer expos Modifier: ed Quantity: 1 Physician Procedures : CPT4 Code Description Modifier [id number] 17616 - WC PHYS LEVEL 4 - NEW PT 25 ICD-10 Diagnosis Description L97.812 Non-pressure chronic ulcer of other part of right lower leg with fat layer exposed R60.0 Localized edema N18.31 Chronic kidney disease,  stage 3a I50.30 Unspecified diastolic (congestive) heart failure Quantity: 1 Electronic Signature(s)  Signed: 03/19/2022  12:01:03 PM By: Dellie Catholic RN Signed: 03/19/2022 12:03:59 PM By: Fredirick Maudlin MD FACS Previous Signature: 03/19/2022 10:43:24 AM Version By: Fredirick Maudlin MD FACS Entered By: Dellie Catholic on 03/19/2022 11:59:06

## 2022-03-22 DIAGNOSIS — H353221 Exudative age-related macular degeneration, left eye, with active choroidal neovascularization: Secondary | ICD-10-CM | POA: Diagnosis not present

## 2022-03-26 ENCOUNTER — Encounter (HOSPITAL_BASED_OUTPATIENT_CLINIC_OR_DEPARTMENT_OTHER): Payer: Medicare Other | Admitting: Internal Medicine

## 2022-03-26 DIAGNOSIS — I5030 Unspecified diastolic (congestive) heart failure: Secondary | ICD-10-CM | POA: Diagnosis not present

## 2022-03-26 DIAGNOSIS — L97819 Non-pressure chronic ulcer of other part of right lower leg with unspecified severity: Secondary | ICD-10-CM | POA: Diagnosis not present

## 2022-03-26 DIAGNOSIS — I130 Hypertensive heart and chronic kidney disease with heart failure and stage 1 through stage 4 chronic kidney disease, or unspecified chronic kidney disease: Secondary | ICD-10-CM | POA: Diagnosis not present

## 2022-03-26 DIAGNOSIS — I480 Paroxysmal atrial fibrillation: Secondary | ICD-10-CM | POA: Diagnosis not present

## 2022-03-26 DIAGNOSIS — R600 Localized edema: Secondary | ICD-10-CM | POA: Diagnosis not present

## 2022-03-26 DIAGNOSIS — S80811A Abrasion, right lower leg, initial encounter: Secondary | ICD-10-CM | POA: Diagnosis not present

## 2022-03-26 DIAGNOSIS — N1831 Chronic kidney disease, stage 3a: Secondary | ICD-10-CM | POA: Diagnosis not present

## 2022-03-26 NOTE — Progress Notes (Signed)
Gregg, [name] ([id number]) [id number] Page 1 of 7 Visit Report for 03/26/2022 Arrival Information Details Patient Name: Date of Service: [name], STREIGHT DO RE E. 03/26/2022 11:00 A M Medical Record Number: [id number] Patient Account Number: [id number] Date of Birth/Sex: Treating RN: 09-Nov-1922 (88 y.o. [name] Primary Care Renso Swett: [name] Other Clinician: Referring Dominik Yordy: Treating Taeler Winning/Extender: Kelby Aline in Treatment: 1 Visit Information History Since Last Visit Added or deleted any medications: No Patient Arrived: Ambulatory Any new allergies or adverse reactions: No Arrival Time: 11:39 Had a fall or experienced change in No Accompanied By: friend activities of daily living that may affect Transfer Assistance: None risk of falls: Patient Identification Verified: Yes Signs or symptoms of abuse/neglect since last visito No Secondary Verification Process Completed: Yes Hospitalized since last visit: No Patient Has Alerts: Yes Implantable device outside of the clinic excluding No Patient Alerts: Patient on Blood Thinner cellular tissue based products placed in the center Eliquis since last visit: Has Dressing in Place as Prescribed: Yes Has Compression in Place as Prescribed: Yes Pain Present Now: No Electronic Signature(s) Signed: 03/26/2022 4:42:15 PM By: Adline Peals Entered By: Adline Peals on 03/26/2022 11:40:11 -------------------------------------------------------------------------------- Encounter Discharge Information Details Patient Name: Date of Service: Gregg Cliche DO RE E. 03/26/2022 11:00 A M Medical Record Number: [id number] Patient Account Number: [id number] Date of Birth/Sex: Treating RN: 1923/04/03 (88 y.o. [name] Primary Care [name]: [name] Other Clinician: Referring Miguel Christiana: Treating [name]/Extender: Kelby Aline in  Treatment: 1 Encounter Discharge Information Items Discharge Condition: Stable Ambulatory Status: Ambulatory Discharge Destination: Home Transportation: Private Auto Accompanied By: friend Schedule Follow-up Appointment: Yes Clinical Summary of Care: Patient Declined Electronic Signature(s) Signed: 03/26/2022 4:42:15 PM By: Adline Peals Entered By: Adline Peals on 03/26/2022 11:55:01 Mcfarlane, [name] ([id number]) [id number] Page 2 of 7 -------------------------------------------------------------------------------- Lower Extremity Assessment Details Patient Name: Date of Service: [name], RAPPAPORT DO RE E. 03/26/2022 11:00 A M Medical Record Number: [id number] Patient Account Number: [id number] Date of Birth/Sex: Treating RN: Oct 04, 1922 (88 y.o. [name] Primary Care Sherill Mangen: [name] Other Clinician: Referring Juwan Vences: Treating Shonique Pelphrey/Extender: Dagoberto Reef, [place] Ouch in Treatment: 1 Edema Assessment Assessed: [name]: No] [name]: No] [Left: Edema] [Right: :] Calf Left: Right: Point of Measurement: From Medial Instep 37.2 cm Ankle Left: Right: Point of Measurement: From Medial Instep 21 cm Vascular Assessment Pulses: Dorsalis Pedis Palpable: [Right:Yes] Electronic Signature(s) Signed: 03/26/2022 4:42:15 PM By: Adline Peals Entered By: Adline Peals on 03/26/2022 11:47:05 -------------------------------------------------------------------------------- Multi Wound Chart Details Patient Name: Date of Service: Gregg Cliche DO RE E. 03/26/2022 11:00 A M Medical Record Number: [id number] Patient Account Number: [id number] Date of Birth/Sex: Treating RN: 04/11/1923 (88 y.o. M) Primary Care [name]: [name] Other Clinician: Referring [name]: Treating [name]/Extender: Dagoberto Reef, [place] Ouch in Treatment: 1 Vital Signs Height(in): 71 Pulse(bpm): 58 Weight(lbs): 167 Blood Pressure(mmHg):  163/61 Body Mass Index(BMI): 23.3 Temperature(F): 97.9 Respiratory Rate(breaths/min): 18 [1:Photos:] [N/A:N/A [id number] Page 3 of 7] Right, Anterior Lower Leg N/A N/A Wound Location: Trauma N/A N/A Wounding Event: Abrasion N/A N/A Primary Etiology: Arrhythmia, Hypertension N/A N/A Comorbid History: 03/01/2022 N/A N/A Date Acquired: 1 N/A N/A Weeks of Treatment: Open N/A N/A Wound Status: No N/A N/A Wound Recurrence: 3.1x2.3x0.1 N/A N/A Measurements L x W x D (cm) 5.6 N/A N/A A (cm) : rea 0.56 N/A N/A Volume (cm) : 40.60% N/A N/A % Reduction in A rea: 40.60% N/A N/A % Reduction in Volume: Full Thickness Without Exposed N/A  N/A Classification: Support Structures Medium N/A N/A Exudate A mount: Serosanguineous N/A N/A Exudate Type: red, brown N/A N/A Exudate Color: Distinct, outline attached N/A N/A Wound Margin: Large (67-100%) N/A N/A Granulation A mount: Red, Pink N/A N/A Granulation Quality: Small (1-33%) N/A N/A Necrotic A mount: Eschar, Adherent Slough N/A N/A Necrotic Tissue: Fat Layer (Subcutaneous Tissue): Yes N/A N/A Exposed Structures: Small (1-33%) N/A N/A Epithelialization: Debridement - Excisional N/A N/A Debridement: Pre-procedure Verification/Time Out 11:55 N/A N/A Taken: Lidocaine 5% topical ointment N/A N/A Pain Control: Callus, Subcutaneous, Slough N/A N/A Tissue Debrided: Skin/Subcutaneous Tissue N/A N/A Level: 7.13 N/A N/A Debridement A (sq cm): rea Curette N/A N/A Instrument: Minimum N/A N/A Bleeding: Pressure N/A N/A Hemostasis A chieved: Procedure was tolerated well N/A N/A Debridement Treatment Response: 3.1x2.3x0.1 N/A N/A Post Debridement Measurements L x W x D (cm) 0.56 N/A N/A Post Debridement Volume: (cm) Scarring: Yes N/A N/A Periwound Skin Texture: No Abnormalities Noted N/A N/A Periwound Skin Moisture: Hemosiderin Staining: Yes N/A N/A Periwound Skin Color: No  Abnormality N/A N/A Temperature: Debridement N/A N/A Procedures Performed: Treatment Notes Wound #1 (Lower Leg) Wound Laterality: Right, Anterior Cleanser Peri-Wound Care Triamcinolone 15 (g) Discharge Instruction: Use triamcinolone 15 (g) as directed Sween Lotion (Moisturizing lotion) Discharge Instruction: Apply moisturizing lotion as directed Topical Primary Dressing IODOFLEX 0.9% Cadexomer Iodine Pad 4x6 cm Discharge Instruction: Apply to wound bed as instructed Secondary Dressing Zetuvit Plus 4x8 in Discharge Instruction: Apply over primary dressing as directed. Secured With Compression Wrap ThreePress (3 layer compression wrap) Discharge Instruction: Apply three layer compression as directed. Compression Stockings Add-Ons Electronic Signature(s) MICHOEL, KUNIN ([id number]) [id number].pdf Page 4 of 7 Signed: 03/26/2022 4:40:59 PM By: Linton Ham MD Entered By: Linton Ham on 03/26/2022 12:41:31 -------------------------------------------------------------------------------- Multi-Disciplinary Care Plan Details Patient Name: Date of Service: Gregg Cliche DO RE E. 03/26/2022 11:00 A M Medical Record Number: [id number] Patient Account Number: [id number] Date of Birth/Sex: Treating RN: 1922-07-10 (88 y.o. [name] Primary Care Melanni Benway: [name] Other Clinician: Referring Tahiry Spicer: Treating Masin Shatto/Extender: Dagoberto Reef, [place] Ouch in Treatment: 1 Active Inactive Pain, Acute or Chronic Nursing Diagnoses: Pain, acute or chronic: actual or potential Goals: Patient/caregiver will verbalize adequate pain control between visits Date Initiated: 03/19/2022 Target Resolution Date: 07/11/2022 Goal Status: Active Interventions: Encourage patient to take pain medications as prescribed Notes: Electronic Signature(s) Signed: 03/26/2022 4:42:15 PM By: Adline Peals Entered By: Adline Peals on 03/26/2022  11:54:11 -------------------------------------------------------------------------------- Pain Assessment Details Patient Name: Date of Service: Gregg Cliche DO RE E. 03/26/2022 11:00 A M Medical Record Number: [id number] Patient Account Number: [id number] Date of Birth/Sex: Treating RN: 06/04/1922 (88 y.o. [name] Primary Care Elison Worrel: [name] Other Clinician: Referring Durrell Barajas: Treating Oneal Biglow/Extender: Kelby Aline in Treatment: 1 Active Problems Location of Pain Severity and Description of Pain Patient Has Paino No Site Locations Rate the pain. JOSEDANIEL, HAYE ([id number]) [id number] Page 5 of 7 Rate the pain. Current Pain Level: 0 Pain Management and Medication Current Pain Management: Electronic Signature(s) Signed: 03/26/2022 4:42:15 PM By: Adline Peals Entered By: Adline Peals on 03/26/2022 11:40:55 -------------------------------------------------------------------------------- Patient/Caregiver Education Details Patient Name: Date of Service: Gregg Cliche DO RE E. 12/15/2023andnbsp11:00 A M Medical Record Number: [id number] Patient Account Number: [id number] Date of Birth/Gender: Treating RN: 20-Oct-1922 (88 y.o. [name] Primary Care Physician: [name] Other Clinician: Referring Physician: Treating Physician/Extender: Kelby Aline in Treatment: 1 Education Assessment Education Provided To: Patient Education Topics Provided Wound/Skin Impairment: Methods: Explain/Verbal Responses:  Reinforcements needed, State content correctly Electronic Signature(s) Signed: 03/26/2022 4:42:15 PM By: Adline Peals Entered By: Adline Peals on 03/26/2022 11:54:22 -------------------------------------------------------------------------------- Wound Assessment Details Patient Name: Date of Service: Gregg Cliche DO RE E. 03/26/2022 11:00 A M Medical Record  Number: [id number] Patient Account Number: [id number] Date of Birth/Sex: Treating RN: 03-03-1923 (88 y.o. [name] Primary Care Kordelia Severin: [name] Other Clinician: Referring Darrah Dredge: Treating Vir Whetstine/Extender: Shawndell, Schillaci ([id number]) [id number] Page 6 of 7 Weeks in Treatment: 1 Wound Status Wound Number: 1 Primary Etiology: Abrasion Wound Location: Right, Anterior Lower Leg Wound Status: Open Wounding Event: Trauma Comorbid History: Arrhythmia, Hypertension Date Acquired: 03/01/2022 Weeks Of Treatment: 1 Clustered Wound: No Photos Wound Measurements Length: (cm) Width: (cm) Depth: (cm) Area: (cm) Volume: (cm) 3.1 % Reduction in Area: 40.6% 2.3 % Reduction in Volume: 40.6% 0.1 Epithelialization: Small (1-33%) 5.6 Tunneling: No 0.56 Undermining: No Wound Description Classification: Full Thickness Without Exposed Suppor Wound Margin: Distinct, outline attached Exudate Amount: Medium Exudate Type: Serosanguineous Exudate Color: red, brown t Structures Foul Odor After Cleansing: No Slough/Fibrino Yes Wound Bed Granulation Amount: Large (67-100%) Exposed Structure Granulation Quality: Red, Pink Fat Layer (Subcutaneous Tissue) Exposed: Yes Necrotic Amount: Small (1-33%) Necrotic Quality: Eschar, Adherent Slough Periwound Skin Texture Texture Color No Abnormalities Noted: No No Abnormalities Noted: No Scarring: Yes Hemosiderin Staining: Yes Moisture Temperature / Pain No Abnormalities Noted: Yes Temperature: No Abnormality Treatment Notes Wound #1 (Lower Leg) Wound Laterality: Right, Anterior Cleanser Peri-Wound Care Triamcinolone 15 (g) Discharge Instruction: Use triamcinolone 15 (g) as directed Sween Lotion (Moisturizing lotion) Discharge Instruction: Apply moisturizing lotion as directed Topical Primary Dressing IODOFLEX 0.9% Cadexomer Iodine Pad 4x6 cm Discharge Instruction:  Apply to wound bed as instructed Secondary Dressing Zetuvit Plus 4x8 in Discharge Instruction: Apply over primary dressing as directed. [name], [name] ([id number]) [id number] Page 7 of 7 Secured With Compression Wrap ThreePress (3 layer compression wrap) Discharge Instruction: Apply three layer compression as directed. Compression Stockings Add-Ons Electronic Signature(s) Signed: 03/26/2022 4:42:15 PM By: Adline Peals Entered By: Adline Peals on 03/26/2022 11:48:48 -------------------------------------------------------------------------------- Vitals Details Patient Name: Date of Service: Gregg Cliche DO RE E. 03/26/2022 11:00 A M Medical Record Number: [id number] Patient Account Number: [id number] Date of Birth/Sex: Treating RN: 1923/04/08 (88 y.o. [name] Primary Care Kyleeann Cremeans: [name] Other Clinician: Referring Hendry Speas: Treating Magdelena Kinsella/Extender: Kelby Aline in Treatment: 1 Vital Signs Time Taken: 11:40 Temperature (F): 97.9 Height (in): 71 Pulse (bpm): 58 Weight (lbs): 167 Respiratory Rate (breaths/min): 18 Body Mass Index (BMI): 23.3 Blood Pressure (mmHg): 163/61 Reference Range: 80 - 120 mg / dl Electronic Signature(s) Signed: 03/26/2022 4:42:15 PM By: Adline Peals Entered By: Adline Peals on 03/26/2022 11:40:50

## 2022-03-26 NOTE — Progress Notes (Signed)
Velez Velez ([id number]) [id number] Page 1 of 6 Visit Report for 03/26/2022 Debridement Details Patient Name: Date of Service: Velez Velez, STEIN DO RE E. 03/26/2022 11:00 A M Medical Record Number: [id number] Patient Account Number: [id number] Date of Birth/Sex: Treating RN: 1922/12/02 (88 y.o. M) Primary Care Provider: Deon Velez Other Clinician: Referring Provider: Treating Provider/Extender: Dagoberto Reef, [place] Ouch in Treatment: 1 Debridement Performed for Assessment: Wound #1 Right,Anterior Lower Leg Performed By: Physician Ricard Dillon., MD Debridement Type: Debridement Level of Consciousness (Pre-procedure): Awake and Alert Pre-procedure Verification/Time Out Yes - 11:55 Taken: Start Time: 11:55 Pain Control: Lidocaine 5% topical ointment T Area Debrided (L x W): otal 3.1 (cm) x 2.3 (cm) = 7.13 (cm) Tissue and other material debrided: Non-Viable, Callus, Slough, Subcutaneous, Skin: Epidermis, Slough Level: Skin/Subcutaneous Tissue Debridement Description: Excisional Instrument: Curette Bleeding: Minimum Hemostasis Achieved: Pressure Response to Treatment: Procedure was tolerated well Level of Consciousness (Post- Awake and Alert procedure): Post Debridement Measurements of Total Wound Length: (cm) 3.1 Width: (cm) 2.3 Depth: (cm) 0.1 Volume: (cm) 0.56 Character of Wound/Ulcer Post Debridement: Improved Post Procedure Diagnosis Same as Pre-procedure Notes scribed for Dr. Dellia Nims by Adline Peals, RN Electronic Signature(s) Signed: 03/26/2022 4:40:59 PM By: Linton Ham MD Entered By: Linton Ham on 03/26/2022 12:41:40 -------------------------------------------------------------------------------- HPI Details Patient Name: Date of Service: Velez Cliche DO RE E. 03/26/2022 11:00 A M Medical Record Number: [id number] Patient Account Number: [id number] Date of Birth/Sex: Treating RN: 28-Feb-1923 (88 y.o. M) Primary  Care Provider: Deon Velez Other Clinician: Referring Provider: Treating Provider/Extender: Kelby Aline in Treatment: 1 History of Present Illness HPI Description: ADMISSION BERGEN, MAGNER ([id number]) [id number] Page 2 of 6 03/19/2022 This is a 88 year old man with a past medical history significant for coronary artery bypass with saphenous vein harvest from the right leg resulting in chronic edema, hypertension, stroke, paroxysmal atrial fibrillation on chronic anticoagulation, CHF, and stage III chronic kidney disease.. Near the beginning of November, he struck his leg on a step while getting into a bus. He developed a wound and went to urgent care but it did not seem to need any repair. It stayed open and he developed some discomfort and redness. There was some serous drainage and he presented to the emergency room on November 13. He was prescribed doxycycline and given bacitracin due to concern for infection. His PCP saw him on November 21 and recommended moist gauze dressing changes with Xeroform. He is accompanied today by his significant other who says she has been dressing it with various topical agents including the mupirocin that was prescribed in the emergency room as well as Vaseline and a T pad. He was unable to tolerate ABIs today. elfa On his right anterior tibial surface, there is a wound that shows evidence of prior healing. There is a yellow dry eschar over the entire surface. Underneath this, there is some nonviable subcutaneous tissue and slough. He has 1+ nonpitting edema in the leg. 12/15; second visit for this 88 year old man who lives in the friend's home [place] [facility] complex. His wound came from trauma after a almost fall getting on the activity bus.. Last week we used Iodoflex with 3 layer compression. He complains about pain in his right leg at night only which is relieved by either dangling his leg over the side  of the bed or standing Electronic Signature(s) Signed: 03/26/2022 4:40:59 PM By: Linton Ham MD Entered By: Linton Ham on 03/26/2022 12:43:58 -------------------------------------------------------------------------------- Physical Exam Details Patient Name: Date of Service:  Velez Velez, [name] 03/26/2022 11:00 A M Medical Record Number: [id number] Patient Account Number: [id number] Date of Birth/Sex: Treating RN: [birth year] (88 y.o. M) Primary Care Provider: Deon Velez Other Clinician: Referring Provider: Treating Provider/Extender: Dagoberto Reef, [place] Ouch in Treatment: 1 Constitutional Patient is hypotensive.. Pulse regular and within target range for patient.. Temperature is normal and within the target range for the patient.Marland Kitchen Appears in no distress. Cardiovascular Pedal pulses on the right foot are thready at best. Notes Wound exam; this is on the right anterior mid tibia. Considerable amount of eschar around the circumference and surface slough which was fibrinous. I used a #5 curette to remove all of this hemostasis with direct pressure he tolerated this well Electronic Signature(s) Signed: 03/26/2022 4:40:59 PM By: Linton Ham MD Entered By: Linton Ham on 03/26/2022 12:46:14 -------------------------------------------------------------------------------- Physician Orders Details Patient Name: Date of Service: Velez Cliche DO RE E. 03/26/2022 11:00 A M Medical Record Number: [id number] Patient Account Number: [id number] Date of Birth/Sex: Treating RN: 1922-05-05 (88 y.o. [name] Primary Care Provider: Deon Velez Other Clinician: Referring Provider: Treating Provider/Extender: Kelby Aline in Treatment: 1 Verbal / Phone Orders: No Diagnosis Coding Follow-up Appointments Return Appointment in 2 weeks. Nurse Visit: ARSENIO, SCHNORR ([id number]) [id number] Page 3 of 6 Anesthetic (In  clinic) Topical Lidocaine 5% applied to wound bed - Used in Clinic [employer] May shower with protection but do not get wound dressing(s) wet. - Please do not get Right Leg wet (with the compression wraps). If showering please use a cast protector. Can purchase from Medical supply store, [place], [facility], [facility] etc. Or just sponge bathe until next week. Edema Control - Lymphedema / SCD / Other void standing for long periods of time. - Elevate the legs throughout the day. A Wound Treatment Wound #1 - Lower Leg Wound Laterality: Right, Anterior Peri-Wound Care: Triamcinolone 15 (g) 1 x Per Week/30 Days Discharge Instructions: Use triamcinolone 15 (g) as directed Peri-Wound Care: Sween Lotion (Moisturizing lotion) 1 x Per Week/30 Days Discharge Instructions: Apply moisturizing lotion as directed Prim Dressing: IODOFLEX 0.9% Cadexomer Iodine Pad 4x6 cm 1 x Per Week/30 Days ary Discharge Instructions: Apply to wound bed as instructed Secondary Dressing: Zetuvit Plus 4x8 in 1 x Per Week/30 Days Discharge Instructions: Apply over primary dressing as directed. Compression Wrap: Kerlix Roll 4.5x3.1 (in/yd) 1 x Per Week/30 Days Discharge Instructions: Apply Kerlix and Coban compression as directed. Compression Wrap: Coban Self-Adherent Wrap 4x5 (in/yd) 1 x Per Week/30 Days Discharge Instructions: Apply over Kerlix as directed. Patient Medications llergies: MSG, lisinopril A Notifications Medication Indication Start End 03/26/2022 lidocaine DOSE topical 5 % ointment - ointment topical Electronic Signature(s) Signed: 03/26/2022 4:40:59 PM By: Linton Ham MD Signed: 03/26/2022 4:42:15 PM By: Adline Peals Entered By: Adline Peals on 03/26/2022 11:58:55 -------------------------------------------------------------------------------- Problem List Details Patient Name: Date of Service: Velez Cliche DO RE E. 03/26/2022 11:00 A M Medical Record Number: [id number] Patient  Account Number: [id number] Date of Birth/Sex: Treating RN: 01/28/1923 (88 y.o. M) Primary Care Provider: Deon Velez Other Clinician: Referring Provider: Treating Provider/Extender: Dagoberto Reef, [place] Ouch in Treatment: 1 Active Problems ICD-10 Encounter Code Description Active Date MDM Diagnosis [phone] Non-pressure chronic ulcer of other part of right lower leg with fat layer 03/19/2022 No Yes exposed [place] (primary) hypertension 03/19/2022 No Yes RANDOM, DOBROWSKI ([id number]) [id number] Page 4 of 6 R60.0 Localized edema 03/19/2022 No Yes N18.31 Chronic kidney disease, stage 3a 03/19/2022 No Yes I50.30 Unspecified  diastolic (congestive) heart failure 03/19/2022 No Yes Inactive Problems Resolved Problems Electronic Signature(s) Signed: 03/26/2022 4:40:59 PM By: Linton Ham MD Entered By: Linton Ham on 03/26/2022 12:41:08 -------------------------------------------------------------------------------- Progress Note Details Patient Name: Date of Service: Velez Cliche DO RE E. 03/26/2022 11:00 A M Medical Record Number: [id number] Patient Account Number: [id number] Date of Birth/Sex: Treating RN: 13-Jan-1923 (88 y.o. M) Primary Care Provider: Deon Velez Other Clinician: Referring Provider: Treating Provider/Extender: Dagoberto Reef, [place] Ouch in Treatment: 1 Subjective History of Present Illness (HPI) ADMISSION 03/19/2022 This is a 88 year old man with a past medical history significant for coronary artery bypass with saphenous vein harvest from the right leg resulting in chronic edema, hypertension, stroke, paroxysmal atrial fibrillation on chronic anticoagulation, CHF, and stage III chronic kidney disease.. Near the beginning of November, he struck his leg on a step while getting into a bus. He developed a wound and went to urgent care but it did not seem to need any repair. It stayed open and he developed some discomfort  and redness. There was some serous drainage and he presented to the emergency room on November 13. He was prescribed doxycycline and given bacitracin due to concern for infection. His PCP saw him on November 21 and recommended moist gauze dressing changes with Xeroform. He is accompanied today by his significant other who says she has been dressing it with various topical agents including the mupirocin that was prescribed in the emergency room as well as Vaseline and a T pad. He was unable to tolerate ABIs today. elfa On his right anterior tibial surface, there is a wound that shows evidence of prior healing. There is a yellow dry eschar over the entire surface. Underneath this, there is some nonviable subcutaneous tissue and slough. He has 1+ nonpitting edema in the leg. 12/15; second visit for this 29 year old man who lives in the friend's home [place] retirement complex. His wound came from trauma after a almost fall getting on the activity bus.. Last week we used Iodoflex with 3 layer compression. He complains about pain in his right leg at night only which is relieved by either dangling his leg over the side of the bed or standing Objective Constitutional Patient is hypotensive.. Pulse regular and within target range for patient.. Temperature is normal and within the target range for the patient.Marland Kitchen Appears in no distress. Vitals Time Taken: 11:40 AM, Height: 71 in, Weight: 167 lbs, BMI: 23.3, Temperature: 97.9 F, Pulse: 58 bpm, Respiratory Rate: 18 breaths/min, Blood Pressure: 163/61 mmHg. Cardiovascular KENYATTA, KEIDEL ([id number]) [id number] Page 5 of 6 Pedal pulses on the right foot are thready at best. General Notes: Wound exam; this is on the right anterior mid tibia. Considerable amount of eschar around the circumference and surface slough which was fibrinous. I used a #5 curette to remove all of this hemostasis with direct pressure he tolerated this  well Integumentary (Hair, Skin) Wound #1 status is Open. Original cause of wound was Trauma. The date acquired was: 03/01/2022. The wound has been in treatment 1 weeks. The wound is located on the Right,Anterior Lower Leg. The wound measures 3.1cm length x 2.3cm width x 0.1cm depth; 5.6cm^2 area and 0.56cm^3 volume. There is Fat Layer (Subcutaneous Tissue) exposed. There is no tunneling or undermining noted. There is a medium amount of serosanguineous drainage noted. The wound margin is distinct with the outline attached to the wound base. There is large (67-100%) red, pink granulation within the wound bed. There is a small (1-33%) amount of  necrotic tissue within the wound bed including Eschar and Adherent Slough. The periwound skin appearance had no abnormalities noted for moisture. The periwound skin appearance exhibited: Scarring, Hemosiderin Staining. Periwound temperature was noted as No Abnormality. Assessment Active Problems ICD-10 Non-pressure chronic ulcer of other part of right lower leg with fat layer exposed Essential (primary) hypertension Localized edema Chronic kidney disease, stage 3a Unspecified diastolic (congestive) heart failure Procedures Wound #1 Pre-procedure diagnosis of Wound #1 is an Abrasion located on the Right,Anterior Lower Leg . There was a Excisional Skin/Subcutaneous Tissue Debridement with a total area of 7.13 sq cm performed by Ricard Dillon., MD. With the following instrument(s): Curette to remove Non-Viable tissue/material. Material removed includes Callus, Subcutaneous Tissue, Slough, and Skin: Epidermis after achieving pain control using Lidocaine 5% topical ointment. No specimens were taken. A time out was conducted at 11:55, prior to the start of the procedure. A Minimum amount of bleeding was controlled with Pressure. The procedure was tolerated well. Post Debridement Measurements: 3.1cm length x 2.3cm width x 0.1cm depth; 0.56cm^3  volume. Character of Wound/Ulcer Post Debridement is improved. Post procedure Diagnosis Wound #1: Same as Pre-Procedure General Notes: scribed for Dr. Dellia Nims by Adline Peals, RN. Plan Follow-up Appointments: Return Appointment in 2 weeks. Nurse Visit: Anesthetic: (In clinic) Topical Lidocaine 5% applied to wound bed - Used in Clinic Bathing/ Shower/ Hygiene: May shower with protection but do not get wound dressing(s) wet. - Please do not get Right Leg wet (with the compression wraps). If showering please use a cast protector. Can purchase from Medical supply store, [place], [facility], [facility] etc. Or just sponge bathe until next week. Edema Control - Lymphedema / SCD / Other: Avoid standing for long periods of time. - Elevate the legs throughout the day. The following medication(s) was prescribed: lidocaine topical 5 % ointment ointment topical was prescribed at facility WOUND #1: - Lower Leg Wound Laterality: Right, Anterior Peri-Wound Care: Triamcinolone 15 (g) 1 x Per Week/30 Days Discharge Instructions: Use triamcinolone 15 (g) as directed Peri-Wound Care: Sween Lotion (Moisturizing lotion) 1 x Per Week/30 Days Discharge Instructions: Apply moisturizing lotion as directed Prim Dressing: IODOFLEX 0.9% Cadexomer Iodine Pad 4x6 cm 1 x Per Week/30 Days ary Discharge Instructions: Apply to wound bed as instructed Secondary Dressing: Zetuvit Plus 4x8 in 1 x Per Week/30 Days Discharge Instructions: Apply over primary dressing as directed. Com pression Wrap: Kerlix Roll 4.5x3.1 (in/yd) 1 x Per Week/30 Days Discharge Instructions: Apply Kerlix and Coban compression as directed. Com pression Wrap: Coban Self-Adherent Wrap 4x5 (in/yd) 1 x Per Week/30 Days Discharge Instructions: Apply over Kerlix as directed. 1. Traumatic wound in the setting of chronic venous insufficiency ando Some degree of PAD 2. I continued with the Iodoflex but because of his complaints suggesting claudication at night  I have reduced him to kerlix Coban from 3 layer. I am hopeful that this will not cause uncontrolled swelling which will inhibit inhibit wound healing. 3. I have not sent him for an arterial duplex study although that may be necessary 4. He is coming in for a nurse visit next week, I would like the nurses to check with him about the nocturnal leg pain complaint BENEN, WEIDA ([id number]) [id number] Page 6 of 6 Electronic Signature(s) Signed: 03/26/2022 4:40:59 PM By: Linton Ham MD Entered By: Linton Ham on 03/26/2022 12:47:43 -------------------------------------------------------------------------------- SuperBill Details Patient Name: Date of Service: Velez Cliche DO RE E. 03/26/2022 Medical Record Number: [id number] Patient Account Number: [id number] Date of Birth/Sex: Treating RN: 03-25-1923 (  88 y.o. M) Primary Care Provider: Deon Velez Other Clinician: Referring Provider: Treating Provider/Extender: Dagoberto Reef, [place] Ouch in Treatment: 1 Diagnosis Coding ICD-10 Codes Code Description [phone] Non-pressure chronic ulcer of other part of right lower leg with fat layer exposed I10 Essential (primary) hypertension R60.0 Localized edema N18.31 Chronic kidney disease, stage 3a I50.30 Unspecified diastolic (congestive) heart failure Facility Procedures : CPT4 Code: [id number] Description: [place] [facility] [place] SUBQ TISSUE 20 SQ CM/< ICD-10 Diagnosis Description L97.812 Non-pressure chronic ulcer of other part of right lower leg with fat layer exp Modifier: osed Quantity: 1 Physician Procedures : CPT4 Code Description Modifier [id number] - WC PHYS SUBQ TISS 20 SQ CM ICD-10 Diagnosis Description E26.834 Non-pressure chronic ulcer of other part of right lower leg with fat layer exposed Quantity: 1 Electronic Signature(s) Signed: 03/26/2022 4:40:59 PM By: Linton Ham MD Entered By: Linton Ham on 03/26/2022 12:47:56

## 2022-04-06 ENCOUNTER — Encounter (HOSPITAL_BASED_OUTPATIENT_CLINIC_OR_DEPARTMENT_OTHER): Payer: Medicare Other | Admitting: General Surgery

## 2022-04-06 DIAGNOSIS — I130 Hypertensive heart and chronic kidney disease with heart failure and stage 1 through stage 4 chronic kidney disease, or unspecified chronic kidney disease: Secondary | ICD-10-CM | POA: Diagnosis not present

## 2022-04-06 DIAGNOSIS — I480 Paroxysmal atrial fibrillation: Secondary | ICD-10-CM | POA: Diagnosis not present

## 2022-04-06 DIAGNOSIS — L97819 Non-pressure chronic ulcer of other part of right lower leg with unspecified severity: Secondary | ICD-10-CM | POA: Diagnosis not present

## 2022-04-06 DIAGNOSIS — N1831 Chronic kidney disease, stage 3a: Secondary | ICD-10-CM | POA: Diagnosis not present

## 2022-04-06 DIAGNOSIS — R600 Localized edema: Secondary | ICD-10-CM | POA: Diagnosis not present

## 2022-04-06 DIAGNOSIS — I5030 Unspecified diastolic (congestive) heart failure: Secondary | ICD-10-CM | POA: Diagnosis not present

## 2022-04-06 NOTE — Progress Notes (Signed)
MCLANE, ARORA ([id number]) [id number] Page 1 of 5 Visit Report for 04/06/2022 Arrival Information Details Patient Name: Date of Service: [name], [name] 04/06/2022 2:00 PM Medical Record Number: [id number] Patient Account Number: [id number] Date of Birth/Sex: Treating RN: 03-18-1923 (88 y.o. [name] Primary Care [name]: [name] Other Clinician: Referring [name]: Treating [name]/Extender: [name] in Treatment: 2 Visit Information History Since Last Visit Added or deleted any medications: No Patient Arrived: Ambulatory Any new allergies or adverse reactions: No Arrival Time: 14:38 Had a fall or experienced change in No Accompanied By: "significant Other" activities of daily living that may affect Transfer Assistance: None risk of falls: Patient Identification Verified: Yes Signs or symptoms of abuse/neglect since last visito No Patient Has Alerts: Yes Hospitalized since last visit: No Patient Alerts: Patient on Blood Thinner Implantable device outside of the clinic excluding No Eliquis cellular tissue based products placed in the center since last visit: Has Dressing in Place as Prescribed: Yes Pain Present Now: Yes Electronic Signature(s) Signed: 04/06/2022 4:30:17 PM By: Gregg Catholic RN Entered By: [name] on 04/06/2022 14:39:10 -------------------------------------------------------------------------------- Clinic Level of Care Assessment Details Patient Name: Date of Service: [name] 04/06/2022 2:00 PM Medical Record Number: [id number] Patient Account Number: [id number] Date of Birth/Sex: Treating RN: Aug 23, 1922 (88 y.o. [name] Primary Care Lili Harts: [name] Other Clinician: Referring Brizeida Mcmurry: Treating Jaselynn Tamas/Extender: [name] in Treatment: 2 Clinic Level of Care Assessment Items TOOL 4 Quantity Score X- 1 0 Use when  only an EandM is performed on FOLLOW-UP visit ASSESSMENTS - Nursing Assessment / Reassessment X- 1 10 Reassessment of Co-morbidities (includes updates in patient status) X- 1 5 Reassessment of Adherence to Treatment Plan ASSESSMENTS - Wound and Skin A ssessment / Reassessment X - Simple Wound Assessment / Reassessment - one wound 1 5 '[]'$  - 0 Complex Wound Assessment / Reassessment - multiple wounds '[]'$  - 0 Dermatologic / Skin Assessment (not related to wound area) ASSESSMENTS - Focused Assessment '[]'$  - 0 Circumferential Edema Measurements - multi extremities '[]'$  - 0 Nutritional Assessment / Counseling / Intervention [name] ([id number]) [id number] Page 2 of 5 '[]'$  - 0 Lower Extremity Assessment (monofilament, tuning fork, pulses) '[]'$  - 0 Peripheral Arterial Disease Assessment (using hand held doppler) ASSESSMENTS - Ostomy and/or Continence Assessment and Care '[]'$  - 0 Incontinence Assessment and Management '[]'$  - 0 Ostomy Care Assessment and Management (repouching, etc.) PROCESS - Coordination of Care X - Simple Patient / Family Education for ongoing care 1 15 '[]'$  - 0 Complex (extensive) Patient / Family Education for ongoing care X- 1 10 Staff obtains Programmer, systems, Records, T Results / Process Orders est X- 1 10 Staff telephones HHA, Nursing Homes / Clarify orders / etc '[]'$  - 0 Routine Transfer to another Facility (non-emergent condition) '[]'$  - 0 Routine Hospital Admission (non-emergent condition) '[]'$  - 0 New Admissions / Biomedical engineer / Ordering NPWT Apligraf, etc. , '[]'$  - 0 Emergency Hospital Admission (emergent condition) X- 1 10 Simple Discharge Coordination '[]'$  - 0 Complex (extensive) Discharge Coordination PROCESS - Special Needs '[]'$  - 0 Pediatric / Minor Patient Management '[]'$  - 0 Isolation Patient Management '[]'$  - 0 Hearing / Language / Visual special needs '[]'$  - 0 Assessment of Community assistance (transportation, D/C planning,  etc.) '[]'$  - 0 Additional assistance / Altered mentation '[]'$  - 0 Support Surface(s) Assessment (bed, cushion, seat, etc.) INTERVENTIONS - Wound Cleansing / Measurement X - Simple Wound Cleansing -  one wound 1 5 '[]'$  - 0 Complex Wound Cleansing - multiple wounds '[]'$  - 0 Wound Imaging (photographs - any number of wounds) '[]'$  - 0 Wound Tracing (instead of photographs) '[]'$  - 0 Simple Wound Measurement - one wound '[]'$  - 0 Complex Wound Measurement - multiple wounds INTERVENTIONS - Wound Dressings '[]'$  - 0 Small Wound Dressing one or multiple wounds '[]'$  - 0 Medium Wound Dressing one or multiple wounds '[]'$  - 0 Large Wound Dressing one or multiple wounds '[]'$  - 0 Application of Medications - topical '[]'$  - 0 Application of Medications - injection INTERVENTIONS - Miscellaneous '[]'$  - 0 External ear exam '[]'$  - 0 Specimen Collection (cultures, biopsies, blood, body fluids, etc.) '[]'$  - 0 Specimen(s) / Culture(s) sent or taken to Lab for analysis '[]'$  - 0 Patient Transfer (multiple staff / Civil Service fast streamer / Similar devices) '[]'$  - 0 Simple Staple / Suture removal (25 or less) '[]'$  - 0 Complex Staple / Suture removal (26 or more) '[]'$  - 0 Hypo / Hyperglycemic Management (close monitor of Blood Glucose) [name], [name] ([id number]) [id number] Page 3 of 5 '[]'$  - 0 Ankle / Brachial Index (ABI) - do not check if billed separately X- 1 5 Vital Signs Has the patient been seen at the hospital within the last three years: Yes Total Score: 75 Level Of Care: New/Established - Level 2 Electronic Signature(s) Signed: 04/06/2022 4:30:17 PM By: Gregg Catholic RN Entered By: [name] on 04/06/2022 14:54:37 -------------------------------------------------------------------------------- Encounter Discharge Information Details Patient Name: Date of Service: [name] 04/06/2022 2:00 PM Medical Record Number: [id number] Patient Account Number: [id number] Date of Birth/Sex: Treating  RN: 1922-05-18 (88 y.o. [name] Primary Care Demaya Hardge: [name] Other Clinician: Referring Saba Neuman: Treating Reuven Braver/Extender: [name] in Treatment: 2 Encounter Discharge Information Items Discharge Condition: Stable Ambulatory Status: Ambulatory Discharge Destination: Home Transportation: Private Auto Accompanied By: Bonney Leitz OTHER Schedule Follow-up Appointment: Yes Clinical Summary of Care: Patient Declined Electronic Signature(s) Signed: 04/06/2022 4:30:17 PM By: Gregg Catholic RN Entered By: [name] on 04/06/2022 16:22:16 -------------------------------------------------------------------------------- Patient/Caregiver Education Details Patient Name: Date of Service: [name] 12/26/2023andnbsp2:00 PM Medical Record Number: [id number] Patient Account Number: [id number] Date of Birth/Gender: Treating RN: 1922-07-07 (88 y.o. [name] Primary Care Physician: [name] Other Clinician: Referring Physician: Treating Physician/Extender: [name] in Treatment: 2 Education Assessment Education Provided To: Patient Education Topics Provided Wound/Skin Impairment: Methods: Explain/Verbal Responses: Return demonstration correctly Electronic Signature(s) Signed: 04/06/2022 4:30:17 PM By: Gregg Catholic RN Entered By: [name] on 04/06/2022 14:54:58 Fehnel, [name] ([id number]) [id number] Page 4 of 5 -------------------------------------------------------------------------------- Wound Assessment Details Patient Name: Date of Service: [name], FOUT DO RE [name] 04/06/2022 2:00 PM Medical Record Number: [id number] Patient Account Number: [id number] Date of Birth/Sex: Treating RN: 04-06-1923 (88 y.o. [name] Primary Care Nilani Hugill: [name] Other Clinician: Referring Courteny Egler: Treating Grizelda Piscopo/Extender: [name] in  Treatment: 2 Wound Status Wound Number: 1 Primary Etiology: Abrasion Wound Location: Right, Anterior Lower Leg Wound Status: Open Wounding Event: Trauma Comorbid History: Arrhythmia, Hypertension Date Acquired: 03/01/2022 Weeks Of Treatment: 2 Clustered Wound: No Wound Measurements Length: (cm) 3.1 Width: (cm) 2.3 Depth: (cm) 0.1 Area: (cm) 5.6 Volume: (cm) 0.56 % Reduction in Area: 40.6% % Reduction in Volume: 40.6% Epithelialization: Small (1-33%) Tunneling: No Undermining: No Wound Description Classification: Full Thickness Without Exposed Support Structures Wound Margin: Distinct, outline attached Exudate Amount: Medium Exudate Type: Serosanguineous Exudate Color: red, brown Foul  Odor After Cleansing: No Slough/Fibrino Yes Wound Bed Granulation Amount: Large (67-100%) Exposed Structure Granulation Quality: Red, Pink Fat Layer (Subcutaneous Tissue) Exposed: Yes Necrotic Amount: Small (1-33%) Necrotic Quality: Eschar, Adherent Slough Periwound Skin Texture Texture Color No Abnormalities Noted: No No Abnormalities Noted: No Scarring: Yes Hemosiderin Staining: Yes Moisture Temperature / Pain No Abnormalities Noted: Yes Temperature: No Abnormality Treatment Notes Wound #1 (Lower Leg) Wound Laterality: Right, Anterior Cleanser Peri-Wound Care Triamcinolone 15 (g) Discharge Instruction: Use triamcinolone 15 (g) as directed Sween Lotion (Moisturizing lotion) Discharge Instruction: Apply moisturizing lotion as directed Topical Primary Dressing IODOFLEX 0.9% Cadexomer Iodine Pad 4x6 cm Discharge Instruction: Apply to wound bed as instructed Secondary Dressing Zetuvit Plus 4x8 in Discharge Instruction: Apply over primary dressing as directed. [name], [name] ([id number]) [id number] Page 5 of 5 Secured With Compression Wrap Kerlix Roll 4.5x3.1 (in/yd) Discharge Instruction: Apply Kerlix and Coban compression as directed. Coban  Self-Adherent Wrap 4x5 (in/yd) Discharge Instruction: Apply over Kerlix as directed. Compression Stockings Add-Ons Electronic Signature(s) Signed: 04/06/2022 4:30:17 PM By: Gregg Catholic RN Entered By: [name] on 04/06/2022 14:39:51 -------------------------------------------------------------------------------- Vitals Details Patient Name: Date of Service: [name] 04/06/2022 2:00 PM Medical Record Number: [id number] Patient Account Number: [id number] Date of Birth/Sex: Treating RN: [birth year] (88 y.o. [name] Primary Care Brianny Soulliere: [name] Other Clinician: Referring Jacquez Sheetz: Treating Shabana Armentrout/Extender: [name] in Treatment: 2 Vital Signs Time Taken: 14:32 Temperature (F): 97.8 Height (in): 71 Pulse (bpm): 49 Weight (lbs): 167 Respiratory Rate (breaths/min): 18 Body Mass Index (BMI): 23.3 Blood Pressure (mmHg): 173/69 Reference Range: 80 - 120 mg / dl Electronic Signature(s) Signed: 04/06/2022 4:30:17 PM By: Gregg Catholic RN Entered By: [name] on 04/06/2022 14:46:47

## 2022-04-07 NOTE — Progress Notes (Signed)
DAK, SZUMSKI ([id number]) [id number] Page 1 of 1 Visit Report for 04/06/2022 SuperBill Details Patient Name: Date of Service: SANJAY, BROADFOOT DO RE E. 04/06/2022 Medical Record Number: [id number] Patient Account Number: [id number] Date of Birth/Sex: Treating RN: 06/01/1922 (88 y.o. [name] Primary Care Provider: [name] Other Clinician: Referring Provider: Treating Provider/Extender: Sela Hua in Treatment: 2 Diagnosis Coding ICD-10 Codes Code Description [phone] Non-pressure chronic ulcer of other part of right lower leg with fat layer exposed I10 Essential (primary) hypertension R60.0 Localized edema N18.31 Chronic kidney disease, stage 3a I50.30 Unspecified diastolic (congestive) heart failure Facility Procedures CPT4 Code Description Modifier Quantity [id number] - WOUND CARE VISIT-LEV 2 EST PT 1 Electronic Signature(s) Signed: 04/06/2022 4:30:17 PM By: Dellie Catholic RN Signed: 04/07/2022 8:36:05 AM By: Fredirick Maudlin MD FACS Entered By: Dellie Catholic on 04/06/2022 16:23:06

## 2022-04-13 ENCOUNTER — Encounter (HOSPITAL_BASED_OUTPATIENT_CLINIC_OR_DEPARTMENT_OTHER): Payer: Medicare Other | Attending: General Surgery | Admitting: General Surgery

## 2022-04-13 DIAGNOSIS — S80811A Abrasion, right lower leg, initial encounter: Secondary | ICD-10-CM | POA: Diagnosis not present

## 2022-04-13 DIAGNOSIS — K219 Gastro-esophageal reflux disease without esophagitis: Secondary | ICD-10-CM | POA: Insufficient documentation

## 2022-04-13 DIAGNOSIS — Z8673 Personal history of transient ischemic attack (TIA), and cerebral infarction without residual deficits: Secondary | ICD-10-CM | POA: Insufficient documentation

## 2022-04-13 DIAGNOSIS — I130 Hypertensive heart and chronic kidney disease with heart failure and stage 1 through stage 4 chronic kidney disease, or unspecified chronic kidney disease: Secondary | ICD-10-CM | POA: Insufficient documentation

## 2022-04-13 DIAGNOSIS — Z7901 Long term (current) use of anticoagulants: Secondary | ICD-10-CM | POA: Insufficient documentation

## 2022-04-13 DIAGNOSIS — Z951 Presence of aortocoronary bypass graft: Secondary | ICD-10-CM | POA: Insufficient documentation

## 2022-04-13 DIAGNOSIS — I480 Paroxysmal atrial fibrillation: Secondary | ICD-10-CM | POA: Insufficient documentation

## 2022-04-13 DIAGNOSIS — I5030 Unspecified diastolic (congestive) heart failure: Secondary | ICD-10-CM | POA: Diagnosis not present

## 2022-04-13 DIAGNOSIS — N1831 Chronic kidney disease, stage 3a: Secondary | ICD-10-CM | POA: Diagnosis not present

## 2022-04-13 DIAGNOSIS — L97812 Non-pressure chronic ulcer of other part of right lower leg with fat layer exposed: Secondary | ICD-10-CM | POA: Diagnosis not present

## 2022-04-13 DIAGNOSIS — E785 Hyperlipidemia, unspecified: Secondary | ICD-10-CM | POA: Diagnosis not present

## 2022-04-13 DIAGNOSIS — N400 Enlarged prostate without lower urinary tract symptoms: Secondary | ICD-10-CM | POA: Insufficient documentation

## 2022-04-13 NOTE — Progress Notes (Signed)
FISHEL, WAMBLE ([id number]) [id number] Page 1 of 8 Visit Report for 04/13/2022 Chief Complaint Document Details Patient Name: Date of Service: [name], [name] 04/13/2022 12:30 PM Medical Record Number: [id number] Patient Account Number: [id number] Date of Birth/Sex: Treating RN: December 09, 1922 (89 y.o. M) Primary Care Provider: [name] Other Clinician: Referring Provider: Treating Provider/Extender: Sela Hua in Treatment: 3 Information Obtained from: Patient Chief Complaint Patient seen for complaints of Non-Healing Wound. Electronic Signature(s) Signed: 04/13/2022 1:07:47 PM By: Fredirick Maudlin MD FACS Entered By: Fredirick Maudlin on 04/13/2022 13:07:47 -------------------------------------------------------------------------------- Debridement Details Patient Name: Date of Service: Gregg Cliche DO RE E. 04/13/2022 12:30 PM Medical Record Number: [id number] Patient Account Number: [id number] Date of Birth/Sex: Treating RN: 24-May-1922 (89 y.o. [name] Primary Care Provider: [name] Other Clinician: Referring Provider: Treating Provider/Extender: Sela Hua in Treatment: 3 Debridement Performed for Assessment: Wound #1 Right,Anterior Lower Leg Performed By: Physician Fredirick Maudlin, MD Debridement Type: Debridement Level of Consciousness (Pre-procedure): Awake and Alert Pre-procedure Verification/Time Out Yes - 12:56 Taken: Start Time: 12:56 Pain Control: Lidocaine 5% topical ointment T Area Debrided (L x W): otal 2 (cm) x 1 (cm) = 2 (cm) Tissue and other material debrided: Non-Viable, Eschar, [place] Level: Non-Viable Tissue Debridement Description: Selective/Open Wound Instrument: Curette Bleeding: Minimum Hemostasis Achieved: Pressure Response to Treatment: Procedure was tolerated well Level of Consciousness (Post- Awake and Alert procedure): Post Debridement  Measurements of Total Wound Length: (cm) 2 Width: (cm) 1 Depth: (cm) 0.1 Volume: (cm) 0.157 Character of Wound/Ulcer Post Debridement: Improved Post Procedure Diagnosis Same as Pre-procedure [name], [name] E ([id number]) [id number] Page 2 of 8 Notes scribed for Dr. Celine Ahr by Adline Peals, RN Electronic Signature(s) Signed: 04/13/2022 1:45:20 PM By: Fredirick Maudlin MD FACS Signed: 04/13/2022 3:22:54 PM By: Adline Peals Entered By: Adline Peals on 04/13/2022 12:57:47 -------------------------------------------------------------------------------- HPI Details Patient Name: Date of Service: Gregg Cliche DO RE E. 04/13/2022 12:30 PM Medical Record Number: [id number] Patient Account Number: [id number] Date of Birth/Sex: Treating RN: Dec 21, 1922 (89 y.o. M) Primary Care Provider: [name] Other Clinician: Referring Provider: Treating Provider/Extender: Sela Hua in Treatment: 3 History of Present Illness HPI Description: ADMISSION 03/19/2022 This is a 89 year old man with a past medical history significant for coronary artery bypass with saphenous vein harvest from the right leg resulting in chronic edema, hypertension, stroke, paroxysmal atrial fibrillation on chronic anticoagulation, CHF, and stage III chronic kidney disease.. Near the beginning of November, he struck his leg on a step while getting into a bus. He developed a wound and went to urgent care but it did not seem to need any repair. It stayed open and he developed some discomfort and redness. There was some serous drainage and he presented to the emergency room on November 13. He was prescribed doxycycline and given bacitracin due to concern for infection. His PCP saw him on November 21 and recommended moist gauze dressing changes with Xeroform. He is accompanied today by his significant other who says she has been dressing it with various topical agents  including the mupirocin that was prescribed in the emergency room as well as Vaseline and a T pad. He was unable to tolerate ABIs today. elfa On his right anterior tibial surface, there is a wound that shows evidence of prior healing. There is a yellow dry eschar over the entire surface. Underneath this, there is some nonviable subcutaneous tissue and slough. He has 1+ nonpitting edema in the leg. 12/15; second  visit for this 44 year old man who lives in the friend's home [place] retirement complex. His wound came from trauma after a almost fall getting on the activity bus.. Last week we used Iodoflex with 3 layer compression. He complains about pain in his right leg at night only which is relieved by either dangling his leg over the side of the bed or standing 04/13/2022: The wound is substantially smaller and shallower from when I saw it last. There is crusted Iodoflex along with rubbery slough on the wound surface. Electronic Signature(s) Signed: 04/13/2022 1:08:21 PM By: Fredirick Maudlin MD FACS Entered By: Fredirick Maudlin on 04/13/2022 13:08:21 -------------------------------------------------------------------------------- Physical Exam Details Patient Name: Date of Service: Gregg Cliche DO RE E. 04/13/2022 12:30 PM Medical Record Number: [id number] Patient Account Number: [id number] Date of Birth/Sex: Treating RN: 19-May-1922 (89 y.o. M) Primary Care Provider: [name] Other Clinician: Referring Provider: Treating Provider/Extender: Sela Hua in Treatment: 3 Constitutional He is hypertensive, but asymptomatic.. Bradycardic, asymptomatic.. . . No acute distress. Respiratory Normal work of breathing on room air. Notes [name], [name] ([id number]) [id number] Page 3 of 8 04/13/2022: The wound is substantially smaller and shallower from when I saw it last. There is crusted Iodoflex along with rubbery slough on the wound surface. Electronic  Signature(s) Signed: 04/13/2022 1:08:56 PM By: Fredirick Maudlin MD FACS Entered By: Fredirick Maudlin on 04/13/2022 13:08:56 -------------------------------------------------------------------------------- Physician Orders Details Patient Name: Date of Service: Gregg Cliche DO RE E. 04/13/2022 12:30 PM Medical Record Number: [id number] Patient Account Number: [id number] Date of Birth/Sex: Treating RN: Oct 22, 1922 (89 y.o. [name] Primary Care Provider: [name] Other Clinician: Referring Provider: Treating Provider/Extender: Sela Hua in Treatment: 3 Verbal / Phone Orders: No Diagnosis Coding ICD-10 Coding Code Description [id number] Non-pressure chronic ulcer of other part of right lower leg with fat layer exposed I10 Essential (primary) hypertension R60.0 Localized edema N18.31 Chronic kidney disease, stage 3a I50.30 Unspecified diastolic (congestive) heart failure Follow-up Appointments ppointment in 1 week. - Dr. Celine Ahr - room 2 Return A Anesthetic (In clinic) Topical Lidocaine 5% applied to wound bed - Used in Clinic Bathing/ Shower/ Hygiene May shower with protection but do not get wound dressing(s) wet. Protect dressing(s) with water repellant cover (for example, large plastic bag) or a cast cover and may then take shower. Edema Control - Lymphedema / SCD / Other void standing for long periods of time. - Elevate the legs throughout the day. A Wound Treatment Wound #1 - Lower Leg Wound Laterality: Right, Anterior Peri-Wound Care: Triamcinolone 15 (g) 1 x Per Week/30 Days Discharge Instructions: Use triamcinolone 15 (g) as directed Peri-Wound Care: Sween Lotion (Moisturizing lotion) 1 x Per Week/30 Days Discharge Instructions: Apply moisturizing lotion as directed Prim Dressing: IODOFLEX 0.9% Cadexomer Iodine Pad 4x6 cm 1 x Per Week/30 Days ary Discharge Instructions: Apply to wound bed as instructed Secondary Dressing: Zetuvit Plus 4x8 in  1 x Per Week/30 Days Discharge Instructions: Apply over primary dressing as directed. Compression Wrap: Kerlix Roll 4.5x3.1 (in/yd) 1 x Per Week/30 Days Discharge Instructions: Apply Kerlix and Coban compression as directed. Compression Wrap: Coban Self-Adherent Wrap 4x5 (in/yd) 1 x Per Week/30 Days Discharge Instructions: Apply over Kerlix as directed. Patient Medications llergies: MSG, lisinopril A Notifications Medication Indication Start End 04/13/2022 lidocaine DOSE topical 5 % ointment - ointment topical [name], [name] ([id number]) [id number] Page 4 of 8 Electronic Signature(s) Signed: 04/13/2022 1:45:20 PM By: Fredirick Maudlin MD FACS Entered By: Fredirick Maudlin on 04/13/2022 13:09:05 --------------------------------------------------------------------------------  Problem List Details Patient Name: Date of Service: [name], SCHUMM DO RE E. 04/13/2022 12:30 PM Medical Record Number: [id number] Patient Account Number: [id number] Date of Birth/Sex: Treating RN: [birth year] (89 y.o. M) Primary Care Provider: [name] Other Clinician: Referring Provider: Treating Provider/Extender: Sela Hua in Treatment: 3 Active Problems ICD-10 Encounter Code Description Active Date MDM Diagnosis [phone] Non-pressure chronic ulcer of other part of right lower leg with fat layer 03/19/2022 No Yes exposed [place] (primary) hypertension 03/19/2022 No Yes R60.0 Localized edema 03/19/2022 No Yes N18.31 Chronic kidney disease, stage 3a 03/19/2022 No Yes I50.30 Unspecified diastolic (congestive) heart failure 03/19/2022 No Yes Inactive Problems Resolved Problems Electronic Signature(s) Signed: 04/13/2022 1:07:34 PM By: Fredirick Maudlin MD FACS Entered By: Fredirick Maudlin on 04/13/2022 13:07:34 -------------------------------------------------------------------------------- Progress Note Details Patient Name: Date of Service: Gregg Cliche DO RE E.  04/13/2022 12:30 PM Medical Record Number: [id number] Patient Account Number: [id number] Date of Birth/Sex: Treating RN: [birth year] (89 y.o. M) Primary Care Provider: [name] Other Clinician: Referring Provider: Treating Provider/Extender: [name], [name] ([id number]) [id number] Page 5 of 8 Weeks in Treatment: 3 Subjective Chief Complaint Information obtained from Patient Patient seen for complaints of Non-Healing Wound. History of Present Illness (HPI) ADMISSION 03/19/2022 This is a 89 year old man with a past medical history significant for coronary artery bypass with saphenous vein harvest from the right leg resulting in chronic edema, hypertension, stroke, paroxysmal atrial fibrillation on chronic anticoagulation, CHF, and stage III chronic kidney disease.. Near the beginning of November, he struck his leg on a step while getting into a bus. He developed a wound and went to urgent care but it did not seem to need any repair. It stayed open and he developed some discomfort and redness. There was some serous drainage and he presented to the emergency room on November 13. He was prescribed doxycycline and given bacitracin due to concern for infection. His PCP saw him on November 21 and recommended moist gauze dressing changes with Xeroform. He is accompanied today by his significant other who says she has been dressing it with various topical agents including the mupirocin that was prescribed in the emergency room as well as Vaseline and a T pad. He was unable to tolerate ABIs today. elfa On his right anterior tibial surface, there is a wound that shows evidence of prior healing. There is a yellow dry eschar over the entire surface. Underneath this, there is some nonviable subcutaneous tissue and slough. He has 1+ nonpitting edema in the leg. 12/15; second visit for this 51 year old man who lives in the friend's home [place] retirement  complex. His wound came from trauma after a almost fall getting on the activity bus.. Last week we used Iodoflex with 3 layer compression. He complains about pain in his right leg at night only which is relieved by either dangling his leg over the side of the bed or standing 04/13/2022: The wound is substantially smaller and shallower from when I saw it last. There is crusted Iodoflex along with rubbery slough on the wound surface. Patient History Information obtained from Patient. Social History Never smoker, Marital Status - Married, Alcohol Use - Rarely, Drug Use - No History, Caffeine Use - Daily. Medical History Cardiovascular Patient has history of Arrhythmia - Hx: RBBB; Paroxysmal atrial Fib., Hypertension Hospitalization/Surgery History - T without cardioversion ; Hernia repair;2000 cardiac Bypass. ee Medical A Surgical History Notes nd Eyes Macular degeneration in Left eye Cardiovascular Hx: CASH with CABG 2000  with LIMA to LAD,SV graft. Hx: Hyperlipidemia Hx: stroke Gastrointestinal GERD Genitourinary Hx: BPH Objective Constitutional He is hypertensive, but asymptomatic.. Bradycardic, asymptomatic.Marland Kitchen No acute distress. Vitals Time Taken: 12:41 PM, Height: 71 in, Weight: 167 lbs, BMI: 23.3, Temperature: 97.7 F, Pulse: 54 bpm, Respiratory Rate: 16 breaths/min, Blood Pressure: 173/84 mmHg. Respiratory Normal work of breathing on room air. General Notes: 04/13/2022: The wound is substantially smaller and shallower from when I saw it last. There is crusted Iodoflex along with rubbery slough on the wound surface. Integumentary (Hair, Skin) Wound #1 status is Open. Original cause of wound was Trauma. The date acquired was: 03/01/2022. The wound has been in treatment 3 weeks. The wound is located on the Right,Anterior Lower Leg. The wound measures 2cm length x 1cm width x 0.1cm depth; 1.571cm^2 area and 0.157cm^3 volume. There is Fat Layer (Subcutaneous Tissue) exposed. There is no  tunneling or undermining noted. There is a medium amount of serosanguineous drainage noted. The wound margin is distinct with the outline attached to the wound base. There is large (67-100%) red, pink granulation within the wound bed. There is a small (1-33%) amount of necrotic tissue within the wound bed including Eschar and Adherent Slough. The periwound skin appearance had no abnormalities noted for moisture. The periwound skin appearance exhibited: Scarring, Hemosiderin Staining. Periwound temperature was noted as No Abnormality. [name], [name] ([id number]) [id number] Page 6 of 8 Assessment Active Problems ICD-10 Non-pressure chronic ulcer of other part of right lower leg with fat layer exposed Essential (primary) hypertension Localized edema Chronic kidney disease, stage 3a Unspecified diastolic (congestive) heart failure Procedures Wound #1 Pre-procedure diagnosis of Wound #1 is an Abrasion located on the Right,Anterior Lower Leg . There was a Selective/Open Wound Non-Viable Tissue Debridement with a total area of 2 sq cm performed by Fredirick Maudlin, MD. With the following instrument(s): Curette to remove Non-Viable tissue/material. Material removed includes Eschar and Slough and after achieving pain control using Lidocaine 5% topical ointment. No specimens were taken. A time out was conducted at 12:56, prior to the start of the procedure. A Minimum amount of bleeding was controlled with Pressure. The procedure was tolerated well. Post Debridement Measurements: 2cm length x 1cm width x 0.1cm depth; 0.157cm^3 volume. Character of Wound/Ulcer Post Debridement is improved. Post procedure Diagnosis Wound #1: Same as Pre-Procedure General Notes: scribed for Dr. Celine Ahr by Adline Peals, RN. Plan Follow-up Appointments: Return Appointment in 1 week. - Dr. Celine Ahr - room 2 Anesthetic: (In clinic) Topical Lidocaine 5% applied to wound bed - Used in  Clinic Bathing/ Shower/ Hygiene: May shower with protection but do not get wound dressing(s) wet. Protect dressing(s) with water repellant cover (for example, large plastic bag) or a cast cover and may then take shower. Edema Control - Lymphedema / SCD / Other: Avoid standing for long periods of time. - Elevate the legs throughout the day. The following medication(s) was prescribed: lidocaine topical 5 % ointment ointment topical was prescribed at facility WOUND #1: - Lower Leg Wound Laterality: Right, Anterior Peri-Wound Care: Triamcinolone 15 (g) 1 x Per Week/30 Days Discharge Instructions: Use triamcinolone 15 (g) as directed Peri-Wound Care: Sween Lotion (Moisturizing lotion) 1 x Per Week/30 Days Discharge Instructions: Apply moisturizing lotion as directed Prim Dressing: IODOFLEX 0.9% Cadexomer Iodine Pad 4x6 cm 1 x Per Week/30 Days ary Discharge Instructions: Apply to wound bed as instructed Secondary Dressing: Zetuvit Plus 4x8 in 1 x Per Week/30 Days Discharge Instructions: Apply over primary dressing as directed. Com pression Wrap: Kerlix  Roll 4.5x3.1 (in/yd) 1 x Per Week/30 Days Discharge Instructions: Apply Kerlix and Coban compression as directed. Com pression Wrap: Coban Self-Adherent Wrap 4x5 (in/yd) 1 x Per Week/30 Days Discharge Instructions: Apply over Kerlix as directed. 04/13/2022: The wound is substantially smaller and shallower from when I saw it last. There is crusted Iodoflex along with rubbery slough on the wound surface. I used a curette to debride slough and eschar from the wound. We will continue Iodoflex with Kerlix and Coban wrap. I am hopeful that the wound will be clean enough next week that we can transition to collagen or alginate. Electronic Signature(s) Signed: 04/13/2022 1:09:34 PM By: Fredirick Maudlin MD FACS Entered By: Fredirick Maudlin on 04/13/2022 13:09:34 HxROS  Details -------------------------------------------------------------------------------- [name] ([id number]) [id number] Page 7 of 8 Patient Name: Date of Service: [name], NETTO DO RE E. 04/13/2022 12:30 PM Medical Record Number: [id number] Patient Account Number: [id number] Date of Birth/Sex: Treating RN: [birth year] (89 y.o. M) Primary Care Provider: [name] Other Clinician: Referring Provider: Treating Provider/Extender: Sela Hua in Treatment: 3 Information Obtained From Patient Eyes Medical History: Past Medical History Notes: Macular degeneration in Left eye Cardiovascular Medical History: Positive for: Arrhythmia - Hx: RBBB; Paroxysmal atrial Fib.; Hypertension Past Medical History Notes: Hx: CASH with CABG 2000 with LIMA to LAD,SV graft. Hx: Hyperlipidemia Hx: stroke Gastrointestinal Medical History: Past Medical History Notes: GERD Genitourinary Medical History: Past Medical History Notes: Hx: BPH Immunizations Pneumococcal Vaccine: Received Pneumococcal Vaccination: Yes Received Pneumococcal Vaccination On or After 60th Birthday: Yes Implantable Devices None Hospitalization / Surgery History Type of Hospitalization/Surgery T without cardioversion ; Hernia repair;2000 cardiac Bypass ee Family and Social History Never smoker; Marital Status - Married; Alcohol Use: Rarely; Drug Use: No History; Caffeine Use: Daily; Financial Concerns: No; Food, Clothing or Shelter Needs: No; Support System Lacking: No; Transportation Concerns: No Electronic Signature(s) Signed: 04/13/2022 1:45:20 PM By: Fredirick Maudlin MD FACS Entered By: Fredirick Maudlin on 04/13/2022 13:08:26 -------------------------------------------------------------------------------- SuperBill Details Patient Name: Date of Service: Gregg Cliche DO RE E. 04/13/2022 Medical Record Number: [id number] Patient Account Number: [id number] Date of  Birth/Sex: Treating RN: 1922/06/16 (89 y.o. M) Primary Care Provider: [name] Other Clinician: Referring Provider: Treating Provider/Extender: Sela Hua in Treatment: 3 LEAMON, PALAU ([id number]) [id number] Page 8 of 8 Diagnosis Coding ICD-10 Codes Code Description [phone] Non-pressure chronic ulcer of other part of right lower leg with fat layer exposed I10 Essential (primary) hypertension R60.0 Localized edema N18.31 Chronic kidney disease, stage 3a I50.30 Unspecified diastolic (congestive) heart failure Facility Procedures : CPT4 Code: [id number] Description: 67544 - DEBRIDE WOUND 1ST 20 SQ CM OR < ICD-10 Diagnosis Description L97.812 Non-pressure chronic ulcer of other part of right lower leg with fat layer expos Modifier: ed Quantity: 1 Physician Procedures : CPT4 Code Description Modifier [id number] - WC PHYS LEVEL 3 - EST PT 25 ICD-10 Diagnosis Description L97.812 Non-pressure chronic ulcer of other part of right lower leg with fat layer exposed R60.0 Localized edema N18.31 Chronic kidney disease,  stage 3a I50.30 Unspecified diastolic (congestive) heart failure Quantity: 1 : [id number] - WC PHYS DEBR WO ANESTH 20 SQ CM ICD-10 Diagnosis Description L97.812 Non-pressure chronic ulcer of other part of right lower leg with fat layer exposed Quantity: 1 Electronic Signature(s) Signed: 04/13/2022 1:09:48 PM By: Fredirick Maudlin MD FACS Entered By: Fredirick Maudlin on 04/13/2022 13:09:48

## 2022-04-13 NOTE — Progress Notes (Signed)
[name], [name] ([id number]) [id number] Page 1 of 7 Visit Report for 04/13/2022 Arrival Information Details Patient Name: Date of Service: [name], ALESI DO RE E. 04/13/2022 12:30 PM Medical Record Number: [id number] Patient Account Number: [id number] Date of Birth/Sex: Treating RN: 10/28/1922 (89 y.o. [name] Primary Care Gabriella Woodhead: [name] Other Clinician: Referring Tadhg Eskew: Treating Cowen Pesqueira/Extender: Sela Hua in Treatment: 3 Visit Information History Since Last Visit Added or deleted any medications: No Patient Arrived: Ambulatory Any new allergies or adverse reactions: No Arrival Time: 12:41 Had a fall or experienced change in No Accompanied By: self activities of daily living that may affect Transfer Assistance: None risk of falls: Patient Identification Verified: Yes Signs or symptoms of abuse/neglect since last visito No Secondary Verification Process Completed: Yes Hospitalized since last visit: No Patient Has Alerts: Yes Implantable device outside of the clinic excluding No Patient Alerts: Patient on Blood Thinner cellular tissue based products placed in the center Eliquis since last visit: Has Dressing in Place as Prescribed: Yes Pain Present Now: No Electronic Signature(s) Signed: 04/13/2022 3:22:54 PM By: Adline Peals Entered By: Adline Peals on 04/13/2022 12:41:27 -------------------------------------------------------------------------------- Encounter Discharge Information Details Patient Name: Date of Service: Gregg Cliche DO RE E. 04/13/2022 12:30 PM Medical Record Number: [id number] Patient Account Number: [id number] Date of Birth/Sex: Treating RN: [birth year] (89 y.o. [name] Primary Care [name]: [name] Other Clinician: Referring Michiel Sivley: Treating Jamael Hoffmann/Extender: Sela Hua in Treatment: 3 Encounter Discharge Information Items Post  Procedure Vitals Discharge Condition: Stable Temperature (F): 97.7 Ambulatory Status: Ambulatory Pulse (bpm): 54 Discharge Destination: Home Respiratory Rate (breaths/min): 16 Transportation: Private Auto Blood Pressure (mmHg): 173/84 Accompanied By: self Schedule Follow-up Appointment: Yes Clinical Summary of Care: Patient Declined Electronic Signature(s) Signed: 04/13/2022 3:22:54 PM By: Adline Peals Entered By: Adline Peals on 04/13/2022 12:58:16 Mierzwa, [name] ([id number]) [id number] Page 2 of 7 -------------------------------------------------------------------------------- Lower Extremity Assessment Details Patient Name: Date of Service: [name], HARKLESS DO RE E. 04/13/2022 12:30 PM Medical Record Number: [id number] Patient Account Number: [id number] Date of Birth/Sex: Treating RN: [birth year] (89 y.o. [name] Primary Care Hue Steveson: [name] Other Clinician: Referring [name]: Treating Ercel Pepitone/Extender: [name] Weeks in Treatment: 3 Edema Assessment Assessed: [Left: No] [Right: No] [Left: Edema] [Right: :] Calf Left: Right: Point of Measurement: From Medial Instep 38.5 cm Ankle Left: Right: Point of Measurement: From Medial Instep 21.6 cm Vascular Assessment Pulses: Dorsalis Pedis Palpable: [Right:Yes] Electronic Signature(s) Signed: 04/13/2022 3:22:54 PM By: Adline Peals Entered By: Adline Peals on 04/13/2022 12:48:19 -------------------------------------------------------------------------------- Multi Wound Chart Details Patient Name: Date of Service: Gregg Cliche DO RE E. 04/13/2022 12:30 PM Medical Record Number: [id number] Patient Account Number: [id number] Date of Birth/Sex: Treating RN: Sep 23, 1922 (89 y.o. M) Primary Care Lusero Nordlund: [name] Other Clinician: Referring Dontrell Stuck: Treating [name]/Extender: Sela Hua in Treatment: 3 Vital  Signs Height(in): 71 Pulse(bpm): 54 Weight(lbs): 167 Blood Pressure(mmHg): 173/84 Body Mass Index(BMI): 23.3 Temperature(F): 97.7 Respiratory Rate(breaths/min): 16 [1:Photos:] [N/A:N/A [id number] Page 3 of 7] Right, Anterior Lower Leg N/A N/A Wound Location: Trauma N/A N/A Wounding Event: Abrasion N/A N/A Primary Etiology: Arrhythmia, Hypertension N/A N/A Comorbid History: 03/01/2022 N/A N/A Date Acquired: 3 N/A N/A Weeks of Treatment: Open N/A N/A Wound Status: No N/A N/A Wound Recurrence: 2x1x0.1 N/A N/A Measurements L x W x D (cm) 1.571 N/A N/A A (cm) : rea 0.157 N/A N/A Volume (cm) : 83.30% N/A N/A % Reduction in A rea: 83.30% N/A N/A % Reduction in  Volume: Full Thickness Without Exposed N/A N/A Classification: Support Structures Medium N/A N/A Exudate A mount: Serosanguineous N/A N/A Exudate Type: red, brown N/A N/A Exudate Color: Distinct, outline attached N/A N/A Wound Margin: Large (67-100%) N/A N/A Granulation A mount: Red, Pink N/A N/A Granulation Quality: Small (1-33%) N/A N/A Necrotic A mount: Eschar, Adherent Slough N/A N/A Necrotic Tissue: Fat Layer (Subcutaneous Tissue): Yes N/A N/A Exposed Structures: Medium (34-66%) N/A N/A Epithelialization: Debridement - Selective/Open Wound N/A N/A Debridement: Pre-procedure Verification/Time Out 12:56 N/A N/A Taken: Lidocaine 5% topical ointment N/A N/A Pain Control: Necrotic/Eschar, Slough N/A N/A Tissue Debrided: Non-Viable Tissue N/A N/A Level: 2 N/A N/A Debridement A (sq cm): rea Curette N/A N/A Instrument: Minimum N/A N/A Bleeding: Pressure N/A N/A Hemostasis A chieved: Procedure was tolerated well N/A N/A Debridement Treatment Response: 2x1x0.1 N/A N/A Post Debridement Measurements L x W x D (cm) 0.157 N/A N/A Post Debridement Volume: (cm) Scarring: Yes N/A N/A Periwound Skin Texture: No Abnormalities Noted N/A N/A Periwound Skin  Moisture: Hemosiderin Staining: Yes N/A N/A Periwound Skin Color: No Abnormality N/A N/A Temperature: Debridement N/A N/A Procedures Performed: Treatment Notes Wound #1 (Lower Leg) Wound Laterality: Right, Anterior Cleanser Peri-Wound Care Triamcinolone 15 (g) Discharge Instruction: Use triamcinolone 15 (g) as directed Sween Lotion (Moisturizing lotion) Discharge Instruction: Apply moisturizing lotion as directed Topical Primary Dressing IODOFLEX 0.9% Cadexomer Iodine Pad 4x6 cm Discharge Instruction: Apply to wound bed as instructed Secondary Dressing Zetuvit Plus 4x8 in Discharge Instruction: Apply over primary dressing as directed. Secured With Compression Wrap Kerlix Roll 4.5x3.1 (in/yd) Discharge Instruction: Apply Kerlix and Coban compression as directed. Coban Self-Adherent Wrap 4x5 (in/yd) Discharge Instruction: Apply over Kerlix as directed. Compression Stockings Add-Ons DESHANNON, HINCHLIFFE ([id number]) [phone].pdf Page 4 of 7 Electronic Signature(s) Signed: 04/13/2022 1:07:41 PM By: Fredirick Maudlin MD FACS Entered By: Fredirick Maudlin on 04/13/2022 13:07:41 -------------------------------------------------------------------------------- Multi-Disciplinary Care Plan Details Patient Name: Date of Service: Gregg Cliche DO RE E. 04/13/2022 12:30 PM Medical Record Number: [id number] Patient Account Number: [id number] Date of Birth/Sex: Treating RN: [birth year] (89 y.o. [name] Primary Care Phyliss Hulick: [name] Other Clinician: Referring Antonio Woodhams: Treating Estefano Victory/Extender: Sela Hua in Treatment: 3 Active Inactive Pain, Acute or Chronic Nursing Diagnoses: Pain, acute or chronic: actual or potential Goals: Patient/caregiver will verbalize adequate pain control between visits Date Initiated: 03/19/2022 Target Resolution Date: 07/11/2022 Goal Status: Active Interventions: Encourage patient to take pain  medications as prescribed Notes: Electronic Signature(s) Signed: 04/13/2022 3:22:54 PM By: Adline Peals Entered By: Adline Peals on 04/13/2022 12:52:04 -------------------------------------------------------------------------------- Pain Assessment Details Patient Name: Date of Service: Gregg Cliche DO RE E. 04/13/2022 12:30 PM Medical Record Number: [id number] Patient Account Number: [id number] Date of Birth/Sex: Treating RN: [birth year] (89 y.o. [name] Primary Care Kimmie Berggren: [name] Other Clinician: Referring Karman Biswell: Treating Camran Keady/Extender: Sela Hua in Treatment: 3 Active Problems Location of Pain Severity and Description of Pain Patient Has Paino No Site Locations Rate the pain. PERRY, BRUCATO ([id number]) [id number] Page 5 of 7 Rate the pain. Current Pain Level: 0 Pain Management and Medication Current Pain Management: Electronic Signature(s) Signed: 04/13/2022 3:22:54 PM By: Adline Peals Entered By: Adline Peals on 04/13/2022 12:43:41 -------------------------------------------------------------------------------- Patient/Caregiver Education Details Patient Name: Date of Service: Gregg Cliche DO RE E. 1/2/2024andnbsp12:30 PM Medical Record Number: [id number] Patient Account Number: [id number] Date of Birth/Gender: Treating RN: 10-Jul-1922 (89 y.o. [name] Primary Care Physician: [name] Other Clinician: Referring Physician: Treating Physician/Extender: Sela Hua in  Treatment: 3 Education Assessment Education Provided To: Patient Education Topics Provided Wound/Skin Impairment: Methods: Explain/Verbal Responses: Reinforcements needed, State content correctly Electronic Signature(s) Signed: 04/13/2022 3:22:54 PM By: Adline Peals Entered By: Adline Peals on 04/13/2022  12:52:23 -------------------------------------------------------------------------------- Wound Assessment Details Patient Name: Date of Service: Gregg Cliche DO RE E. 04/13/2022 12:30 PM Medical Record Number: [id number] Patient Account Number: [id number] Date of Birth/Sex: Treating RN: [birth year] (89 y.o. [name] Primary Care Evon Lopezperez: [name] Other Clinician: Referring Issiac Jamar: Treating Isobella Ascher/Extender: Coleston, Dirosa ([id number]) [id number] Page 6 of 7 Weeks in Treatment: 3 Wound Status Wound Number: 1 Primary Etiology: Abrasion Wound Location: Right, Anterior Lower Leg Wound Status: Open Wounding Event: Trauma Comorbid History: Arrhythmia, Hypertension Date Acquired: 03/01/2022 Weeks Of Treatment: 3 Clustered Wound: No Photos Wound Measurements Length: (cm) 2 Width: (cm) 1 Depth: (cm) 0.1 Area: (cm) 1.571 Volume: (cm) 0.157 % Reduction in Area: 83.3% % Reduction in Volume: 83.3% Epithelialization: Medium (34-66%) Tunneling: No Undermining: No Wound Description Classification: Full Thickness Without Exposed Suppor Wound Margin: Distinct, outline attached Exudate Amount: Medium Exudate Type: Serosanguineous Exudate Color: red, brown t Structures Foul Odor After Cleansing: No Slough/Fibrino Yes Wound Bed Granulation Amount: Large (67-100%) Exposed Structure Granulation Quality: Red, Pink Fat Layer (Subcutaneous Tissue) Exposed: Yes Necrotic Amount: Small (1-33%) Necrotic Quality: Eschar, Adherent Slough Periwound Skin Texture Texture Color No Abnormalities Noted: No No Abnormalities Noted: No Scarring: Yes Hemosiderin Staining: Yes Moisture Temperature / Pain No Abnormalities Noted: Yes Temperature: No Abnormality Treatment Notes Wound #1 (Lower Leg) Wound Laterality: Right, Anterior Cleanser Peri-Wound Care Triamcinolone 15 (g) Discharge Instruction: Use triamcinolone 15 (g) as  directed Sween Lotion (Moisturizing lotion) Discharge Instruction: Apply moisturizing lotion as directed Topical Primary Dressing IODOFLEX 0.9% Cadexomer Iodine Pad 4x6 cm Discharge Instruction: Apply to wound bed as instructed Secondary Dressing Zetuvit Plus 4x8 in Discharge Instruction: Apply over primary dressing as directed. TRAVER, MECKES ([id number]) [id number] Page 7 of 7 Secured With Compression Wrap Kerlix Roll 4.5x3.1 (in/yd) Discharge Instruction: Apply Kerlix and Coban compression as directed. Coban Self-Adherent Wrap 4x5 (in/yd) Discharge Instruction: Apply over Kerlix as directed. Compression Stockings Add-Ons Electronic Signature(s) Signed: 04/13/2022 3:22:54 PM By: Adline Peals Entered By: Adline Peals on 04/13/2022 12:49:47 -------------------------------------------------------------------------------- Vitals Details Patient Name: Date of Service: Gregg Cliche DO RE E. 04/13/2022 12:30 PM Medical Record Number: [id number] Patient Account Number: [id number] Date of Birth/Sex: Treating RN: [birth year] (89 y.o. [name] Primary Care Zyair Rhein: [name] Other Clinician: Referring Amjad Fikes: Treating Soleil Mas/Extender: Sela Hua in Treatment: 3 Vital Signs Time Taken: 12:41 Temperature (F): 97.7 Height (in): 71 Pulse (bpm): 54 Weight (lbs): 167 Respiratory Rate (breaths/min): 16 Body Mass Index (BMI): 23.3 Blood Pressure (mmHg): 173/84 Reference Range: 80 - 120 mg / dl Electronic Signature(s) Signed: 04/13/2022 3:22:54 PM By: Adline Peals Entered By: Adline Peals on 04/13/2022 12:43:35

## 2022-04-14 DIAGNOSIS — Z85828 Personal history of other malignant neoplasm of skin: Secondary | ICD-10-CM | POA: Diagnosis not present

## 2022-04-14 DIAGNOSIS — C44329 Squamous cell carcinoma of skin of other parts of face: Secondary | ICD-10-CM | POA: Diagnosis not present

## 2022-04-14 DIAGNOSIS — D0439 Carcinoma in situ of skin of other parts of face: Secondary | ICD-10-CM | POA: Diagnosis not present

## 2022-04-20 ENCOUNTER — Encounter (HOSPITAL_BASED_OUTPATIENT_CLINIC_OR_DEPARTMENT_OTHER): Payer: Medicare Other | Admitting: General Surgery

## 2022-04-20 DIAGNOSIS — S80811A Abrasion, right lower leg, initial encounter: Secondary | ICD-10-CM | POA: Diagnosis not present

## 2022-04-20 DIAGNOSIS — K219 Gastro-esophageal reflux disease without esophagitis: Secondary | ICD-10-CM | POA: Diagnosis not present

## 2022-04-20 DIAGNOSIS — I5030 Unspecified diastolic (congestive) heart failure: Secondary | ICD-10-CM | POA: Diagnosis not present

## 2022-04-20 DIAGNOSIS — N1831 Chronic kidney disease, stage 3a: Secondary | ICD-10-CM | POA: Diagnosis not present

## 2022-04-20 DIAGNOSIS — N400 Enlarged prostate without lower urinary tract symptoms: Secondary | ICD-10-CM | POA: Diagnosis not present

## 2022-04-20 DIAGNOSIS — I130 Hypertensive heart and chronic kidney disease with heart failure and stage 1 through stage 4 chronic kidney disease, or unspecified chronic kidney disease: Secondary | ICD-10-CM | POA: Diagnosis not present

## 2022-04-20 DIAGNOSIS — L97812 Non-pressure chronic ulcer of other part of right lower leg with fat layer exposed: Secondary | ICD-10-CM | POA: Diagnosis not present

## 2022-04-20 NOTE — Progress Notes (Signed)
AAIDEN, DEPOY ([id number]) [id number] Page 1 of 8 Visit Report for 04/20/2022 Chief Complaint Document Details Patient Name: Date of Service: [name], MCCULLARS DO RE [name] 04/20/2022 2:45 PM Medical Record Number: [id number] Patient Account Number: [id number] Date of Birth/Sex: Treating RN: February 03, 1923 (89 y.o. M) Primary Care Provider: [name] Other Clinician: Referring Provider: Treating Provider/Extender: Sela Hua in Treatment: 4 Information Obtained from: Patient Chief Complaint Patient seen for complaints of Non-Healing Wound. Electronic Signature(s) Signed: 04/20/2022 3:35:41 PM By: Fredirick Maudlin MD FACS Entered By: Fredirick Maudlin on 04/20/2022 15:35:40 -------------------------------------------------------------------------------- Debridement Details Patient Name: Date of Service: Gregg Cliche DO RE [name] 04/20/2022 2:45 PM Medical Record Number: [id number] Patient Account Number: [id number] Date of Birth/Sex: Treating RN: [birth year] (89 y.o. [name] Primary Care Provider: [name] Other Clinician: Referring Provider: Treating Provider/Extender: Sela Hua in Treatment: 4 Debridement Performed for Assessment: Wound #1 Right,Anterior Lower Leg Performed By: Physician Fredirick Maudlin, MD Debridement Type: Debridement Level of Consciousness (Pre-procedure): Awake and Alert Pre-procedure Verification/Time Out Yes - 15:09 Taken: Start Time: 15:09 Pain Control: Lidocaine 4% Topical Solution T Area Debrided (L x W): otal 2 (cm) x 1 (cm) = 2 (cm) Tissue and other material debrided: Non-Viable, Eschar, [place] Level: Non-Viable Tissue Debridement Description: Selective/Open Wound Instrument: Curette Bleeding: Minimum Hemostasis Achieved: Pressure End Time: 15:10 Procedural Pain: 0 Post Procedural Pain: 0 Response to Treatment: Procedure was tolerated well Level of Consciousness  (Post- Awake and Alert procedure): Post Debridement Measurements of Total Wound Length: (cm) 2 Width: (cm) 1 Depth: (cm) 0.1 Volume: (cm) 0.157 Character of Wound/Ulcer Post Debridement: Improved Post Procedure Diagnosis [name], [name] ([id number]) [id number] Page 2 of 8 Same as Pre-procedure Notes Scribed for Dr. Celine Ahr by J.Scotton Electronic Signature(s) Signed: 04/20/2022 3:52:46 PM By: Fredirick Maudlin MD FACS Signed: 04/20/2022 4:03:05 PM By: Dellie Catholic RN Entered By: Dellie Catholic on 04/20/2022 15:16:28 -------------------------------------------------------------------------------- HPI Details Patient Name: Date of Service: Gregg Cliche DO RE [name] 04/20/2022 2:45 PM Medical Record Number: [id number] Patient Account Number: [id number] Date of Birth/Sex: Treating RN: 1922-05-28 (89 y.o. M) Primary Care Provider: [name] Other Clinician: Referring Provider: Treating Provider/Extender: Sela Hua in Treatment: 4 History of Present Illness HPI Description: ADMISSION 03/19/2022 This is a 89 year old man with a past medical history significant for coronary artery bypass with saphenous vein harvest from the right leg resulting in chronic edema, hypertension, stroke, paroxysmal atrial fibrillation on chronic anticoagulation, CHF, and stage III chronic kidney disease.. Near the beginning of November, he struck his leg on a step while getting into a bus. He developed a wound and went to urgent care but it did not seem to need any repair. It stayed open and he developed some discomfort and redness. There was some serous drainage and he presented to the emergency room on November 13. He was prescribed doxycycline and given bacitracin due to concern for infection. His PCP saw him on November 21 and recommended moist gauze dressing changes with Xeroform. He is accompanied today by his significant other who says she has been dressing  it with various topical agents including the mupirocin that was prescribed in the emergency room as well as Vaseline and a T pad. He was unable to tolerate ABIs today. elfa On his right anterior tibial surface, there is a wound that shows evidence of prior healing. There is a yellow dry eschar over the entire surface. Underneath this, there is some nonviable subcutaneous tissue and slough. He  has 1+ nonpitting edema in the leg. 12/15; second visit for this 58 year old man who lives in the friend's home [place] [facility] complex. His wound came from trauma after a almost fall getting on the activity bus.. Last week we used Iodoflex with 3 layer compression. He complains about pain in his right leg at night only which is relieved by either dangling his leg over the side of the bed or standing 04/13/2022: The wound is substantially smaller and shallower from when I saw it last. There is crusted Iodoflex along with rubbery slough on the wound surface. 04/20/2022: The wound is smaller and cleaner today. Still with a fair amount of rubbery slough, however. Electronic Signature(s) Signed: 04/20/2022 3:40:13 PM By: Fredirick Maudlin MD FACS Entered By: Fredirick Maudlin on 04/20/2022 15:40:13 -------------------------------------------------------------------------------- Physical Exam Details Patient Name: Date of Service: Gregg Cliche DO RE [name] 04/20/2022 2:45 PM Medical Record Number: [id number] Patient Account Number: [id number] Date of Birth/Sex: Treating RN: 1922-07-12 (89 y.o. M) Primary Care Provider: [name] Other Clinician: Referring Provider: Treating Provider/Extender: Sela Hua in Treatment: 4 Constitutional Hypertensive, asymptomatic. Bradycardic, asymptomatic. . . no acute distress. Respiratory [name], [name] ([id number]) [id number] Page 3 of 8 Normal work of breathing on room air. Notes 04/20/2022: The wound is smaller and cleaner today.  Still with a fair amount of rubbery slough, however. Electronic Signature(s) Signed: 04/20/2022 3:40:49 PM By: Fredirick Maudlin MD FACS Entered By: Fredirick Maudlin on 04/20/2022 15:40:49 -------------------------------------------------------------------------------- Physician Orders Details Patient Name: Date of Service: Gregg Cliche DO RE [name] 04/20/2022 2:45 PM Medical Record Number: [id number] Patient Account Number: [id number] Date of Birth/Sex: Treating RN: [birth year] (89 y.o. [name] Primary Care Provider: [name] Other Clinician: Referring Provider: Treating Provider/Extender: Sela Hua in Treatment: 4 Verbal / Phone Orders: No Diagnosis Coding ICD-10 Coding Code Description [phone] Non-pressure chronic ulcer of other part of right lower leg with fat layer exposed I10 Essential (primary) hypertension R60.0 Localized edema N18.31 Chronic kidney disease, stage 3a I50.30 Unspecified diastolic (congestive) heart failure Follow-up Appointments ppointment in 1 week. - Dr. Celine Ahr - Room 2 Return A Anesthetic (In clinic) Topical Lidocaine 5% applied to wound bed - Used in Clinic Bathing/ Shower/ Hygiene May shower with protection but do not get wound dressing(s) wet. Protect dressing(s) with water repellant cover (for example, large plastic bag) or a cast cover and may then take shower. Edema Control - Lymphedema / SCD / Other void standing for long periods of time. - Elevate the legs throughout the day. A Wound Treatment Wound #1 - Lower Leg Wound Laterality: Right, Anterior Peri-Wound Care: Triamcinolone 15 (g) 1 x Per Week/30 Days Discharge Instructions: Use triamcinolone 15 (g) as directed Peri-Wound Care: Sween Lotion (Moisturizing lotion) 1 x Per Week/30 Days Discharge Instructions: Apply moisturizing lotion as directed Prim Dressing: IODOFLEX 0.9% Cadexomer Iodine Pad 4x6 cm 1 x Per Week/30 Days ary Discharge Instructions: Apply to  wound bed as instructed Secondary Dressing: Zetuvit Plus 4x8 in 1 x Per Week/30 Days Discharge Instructions: Apply over primary dressing as directed. Compression Wrap: Kerlix Roll 4.5x3.1 (in/yd) 1 x Per Week/30 Days Discharge Instructions: Apply Kerlix and Coban compression as directed. Compression Wrap: Coban Self-Adherent Wrap 4x5 (in/yd) 1 x Per Week/30 Days Discharge Instructions: Apply over Kerlix as directed. Electronic Signature(s) Signed: 04/20/2022 3:52:46 PM By: Fredirick Maudlin MD FACS [name], [name] ([id number]) PM By: Fredirick Maudlin MD FACS [id number].pdf Page 4 of 8 Signed: 04/20/2022 3:52:46 Entered By: Fredirick Maudlin on 04/20/2022  15:41:51 -------------------------------------------------------------------------------- Problem List Details Patient Name: Date of Service: [name], PRISK DO RE [name] 04/20/2022 2:45 PM Medical Record Number: [id number] Patient Account Number: [id number] Date of Birth/Sex: Treating RN: 07/19/1922 (89 y.o. M) Primary Care Provider: [name] Other Clinician: Referring Provider: Treating Provider/Extender: Sela Hua in Treatment: 4 Active Problems ICD-10 Encounter Code Description Active Date MDM Diagnosis [phone] Non-pressure chronic ulcer of other part of right lower leg with fat layer 03/19/2022 No Yes exposed [place] (primary) hypertension 03/19/2022 No Yes R60.0 Localized edema 03/19/2022 No Yes N18.31 Chronic kidney disease, stage 3a 03/19/2022 No Yes I50.30 Unspecified diastolic (congestive) heart failure 03/19/2022 No Yes Inactive Problems Resolved Problems Electronic Signature(s) Signed: 04/20/2022 3:35:28 PM By: Fredirick Maudlin MD FACS Entered By: Fredirick Maudlin on 04/20/2022 15:35:28 -------------------------------------------------------------------------------- Progress Note Details Patient Name: Date of Service: Gregg Cliche DO RE [name] 04/20/2022 2:45 PM Medical Record Number:  [id number] Patient Account Number: [id number] Date of Birth/Sex: Treating RN: 02-01-1923 (89 y.o. M) Primary Care Provider: [name] Other Clinician: Referring Provider: Treating Provider/Extender: Sela Hua in Treatment: 4 Subjective Chief Complaint [name], [name] ([id number]) [id number] Page 5 of 8 Information obtained from Patient Patient seen for complaints of Non-Healing Wound. History of Present Illness (HPI) ADMISSION 03/19/2022 This is a 89 year old man with a past medical history significant for coronary artery bypass with saphenous vein harvest from the right leg resulting in chronic edema, hypertension, stroke, paroxysmal atrial fibrillation on chronic anticoagulation, CHF, and stage III chronic kidney disease.. Near the beginning of November, he struck his leg on a step while getting into a bus. He developed a wound and went to urgent care but it did not seem to need any repair. It stayed open and he developed some discomfort and redness. There was some serous drainage and he presented to the emergency room on November 13. He was prescribed doxycycline and given bacitracin due to concern for infection. His PCP saw him on November 21 and recommended moist gauze dressing changes with Xeroform. He is accompanied today by his significant other who says she has been dressing it with various topical agents including the mupirocin that was prescribed in the emergency room as well as Vaseline and a T pad. He was unable to tolerate ABIs today. elfa On his right anterior tibial surface, there is a wound that shows evidence of prior healing. There is a yellow dry eschar over the entire surface. Underneath this, there is some nonviable subcutaneous tissue and slough. He has 1+ nonpitting edema in the leg. 12/15; second visit for this 89 year old man who lives in the friend's home [place] retirement complex. His wound came from trauma after  a almost fall getting on the activity bus.. Last week we used Iodoflex with 3 layer compression. He complains about pain in his right leg at night only which is relieved by either dangling his leg over the side of the bed or standing 04/13/2022: The wound is substantially smaller and shallower from when I saw it last. There is crusted Iodoflex along with rubbery slough on the wound surface. 04/20/2022: The wound is smaller and cleaner today. Still with a fair amount of rubbery slough, however. Patient History Information obtained from Patient. Social History Never smoker, Marital Status - Married, Alcohol Use - Rarely, Drug Use - No History, Caffeine Use - Daily. Medical History Cardiovascular Patient has history of Arrhythmia - Hx: RBBB; Paroxysmal atrial Fib., Hypertension Hospitalization/Surgery History - T without cardioversion ; Hernia repair;2000 cardiac Bypass.  ee Medical A Surgical History Notes nd Eyes Macular degeneration in Left eye Cardiovascular Hx: CASH with CABG 2000 with LIMA to LAD,SV graft. Hx: Hyperlipidemia Hx: stroke Gastrointestinal GERD Genitourinary Hx: BPH Objective Constitutional Hypertensive, asymptomatic. Bradycardic, asymptomatic. no acute distress. Vitals Time Taken: 2:54 PM, Height: 71 in, Weight: 167 lbs, BMI: 23.3, Temperature: 98.0 F, Pulse: 51 bpm, Respiratory Rate: 20 breaths/min, Blood Pressure: 182/72 mmHg. Respiratory Normal work of breathing on room air. General Notes: 04/20/2022: The wound is smaller and cleaner today. Still with a fair amount of rubbery slough, however. Integumentary (Hair, Skin) Wound #1 status is Open. Original cause of wound was Trauma. The date acquired was: 03/01/2022. The wound has been in treatment 4 weeks. The wound is located on the Right,Anterior Lower Leg. The wound measures 2cm length x 1cm width x 0.1cm depth; 1.571cm^2 area and 0.157cm^3 volume. There is Fat Layer (Subcutaneous Tissue) exposed. There is no  tunneling or undermining noted. There is a medium amount of serosanguineous drainage noted. The wound margin is distinct with the outline attached to the wound base. There is large (67-100%) pink granulation within the wound bed. There is a small (1-33%) amount of necrotic tissue within the wound bed including Eschar and Adherent Slough. The periwound skin appearance had no abnormalities noted for moisture. The periwound skin appearance exhibited: Scarring, Hemosiderin Staining. Periwound temperature was noted as No Abnormality. Assessment Active Problems [name], [name] ([id number]) [phone].pdf Page 6 of 8 ICD-10 Non-pressure chronic ulcer of other part of right lower leg with fat layer exposed Essential (primary) hypertension Localized edema Chronic kidney disease, stage 3a Unspecified diastolic (congestive) heart failure Procedures Wound #1 Pre-procedure diagnosis of Wound #1 is an Abrasion located on the Right,Anterior Lower Leg . There was a Selective/Open Wound Non-Viable Tissue Debridement with a total area of 2 sq cm performed by Fredirick Maudlin, MD. With the following instrument(s): Curette to remove Non-Viable tissue/material. Material removed includes Eschar and Slough and after achieving pain control using Lidocaine 4% T opical Solution. No specimens were taken. A time out was conducted at 15:09, prior to the start of the procedure. A Minimum amount of bleeding was controlled with Pressure. The procedure was tolerated well with a pain level of 0 throughout and a pain level of 0 following the procedure. Post Debridement Measurements: 2cm length x 1cm width x 0.1cm depth; 0.157cm^3 volume. Character of Wound/Ulcer Post Debridement is improved. Post procedure Diagnosis Wound #1: Same as Pre-Procedure General Notes: Scribed for Dr. Celine Ahr by J.Scotton. Plan Follow-up Appointments: Return Appointment in 1 week. - Dr. Celine Ahr - Room 2 Anesthetic: (In  clinic) Topical Lidocaine 5% applied to wound bed - Used in Clinic Bathing/ Shower/ Hygiene: May shower with protection but do not get wound dressing(s) wet. Protect dressing(s) with water repellant cover (for example, large plastic bag) or a cast cover and may then take shower. Edema Control - Lymphedema / SCD / Other: Avoid standing for long periods of time. - Elevate the legs throughout the day. WOUND #1: - Lower Leg Wound Laterality: Right, Anterior Peri-Wound Care: Triamcinolone 15 (g) 1 x Per Week/30 Days Discharge Instructions: Use triamcinolone 15 (g) as directed Peri-Wound Care: Sween Lotion (Moisturizing lotion) 1 x Per Week/30 Days Discharge Instructions: Apply moisturizing lotion as directed Prim Dressing: IODOFLEX 0.9% Cadexomer Iodine Pad 4x6 cm 1 x Per Week/30 Days ary Discharge Instructions: Apply to wound bed as instructed Secondary Dressing: Zetuvit Plus 4x8 in 1 x Per Week/30 Days Discharge Instructions: Apply over primary dressing as  directed. Com pression Wrap: Kerlix Roll 4.5x3.1 (in/yd) 1 x Per Week/30 Days Discharge Instructions: Apply Kerlix and Coban compression as directed. Com pression Wrap: Coban Self-Adherent Wrap 4x5 (in/yd) 1 x Per Week/30 Days Discharge Instructions: Apply over Kerlix as directed. 04/20/2022: The wound is smaller and cleaner today. Still with a fair amount of rubbery slough, however. I used a curette to debride the slough from the wound. We will continue Iodoflex with Kerlix and Coban wrap. Hopefully at his next visit, it will be clean enough that we can transition to alginate or collagen. Follow-up in 1 week's time. Electronic Signature(s) Signed: 04/20/2022 3:42:22 PM By: Fredirick Maudlin MD FACS Entered By: Fredirick Maudlin on 04/20/2022 15:42:22 -------------------------------------------------------------------------------- HxROS Details Patient Name: Date of Service: Gregg Cliche DO RE [name] 04/20/2022 2:45 PM Medical Record Number:  [id number] Patient Account Number: [id number] Date of Birth/Sex: Treating RN: [birth year] (89 y.o. M) Primary Care Provider: [name] Other Clinician: Referring Provider: Treating Provider/Extender: Sela Hua in Treatment: 4 [name], [name] ([id number]) [id number] Page 7 of 8 Information Obtained From Patient Eyes Medical History: Past Medical History Notes: Macular degeneration in Left eye Cardiovascular Medical History: Positive for: Arrhythmia - Hx: RBBB; Paroxysmal atrial Fib.; Hypertension Past Medical History Notes: Hx: CASH with CABG 2000 with LIMA to LAD,SV graft. Hx: Hyperlipidemia Hx: stroke Gastrointestinal Medical History: Past Medical History Notes: GERD Genitourinary Medical History: Past Medical History Notes: Hx: BPH Immunizations Pneumococcal Vaccine: Received Pneumococcal Vaccination: Yes Received Pneumococcal Vaccination On or After 60th Birthday: Yes Implantable Devices None Hospitalization / Surgery History Type of Hospitalization/Surgery T without cardioversion ; Hernia repair;2000 cardiac Bypass ee Family and Social History Never smoker; Marital Status - Married; Alcohol Use: Rarely; Drug Use: No History; Caffeine Use: Daily; Financial Concerns: No; Food, Clothing or Shelter Needs: No; Support System Lacking: No; Transportation Concerns: No Electronic Signature(s) Signed: 04/20/2022 3:52:46 PM By: Fredirick Maudlin MD FACS Entered By: Fredirick Maudlin on 04/20/2022 15:40:19 -------------------------------------------------------------------------------- SuperBill Details Patient Name: Date of Service: Gregg Cliche DO RE [name] 04/20/2022 Medical Record Number: [id number] Patient Account Number: [id number] Date of Birth/Sex: Treating RN: 02/24/1923 (89 y.o. M) Primary Care Provider: [name] Other Clinician: Referring Provider: Treating Provider/Extender: Sela Hua in  Treatment: 4 Diagnosis Coding ICD-10 Codes Code Description [phone] Non-pressure chronic ulcer of other part of right lower leg with fat layer exposed [place] (primary) hypertension CANDELARIO, STEPPE Velez ([id number]) [phone].pdf Page 8 of 8 R60.0 Localized edema N18.31 Chronic kidney disease, stage 3a I50.30 Unspecified diastolic (congestive) heart failure Facility Procedures : CPT4 Code: [id number] Description: 85277 - DEBRIDE WOUND 1ST 20 SQ CM OR < ICD-10 Diagnosis Description O24.235 Non-pressure chronic ulcer of other part of right lower leg with fat layer expos Modifier: ed Quantity: 1 Physician Procedures : CPT4 Code Description Modifier [id number] - WC PHYS LEVEL 3 - EST PT 25 ICD-10 Diagnosis Description L97.812 Non-pressure chronic ulcer of other part of right lower leg with fat layer exposed I50.30 Unspecified diastolic (congestive) heart failure  N18.31 Chronic kidney disease, stage 3a R60.0 Localized edema Quantity: 1 : [id number] - WC PHYS DEBR WO ANESTH 20 SQ CM ICD-10 Diagnosis Description L97.812 Non-pressure chronic ulcer of other part of right lower leg with fat layer exposed Quantity: 1 Electronic Signature(s) Signed: 04/20/2022 3:42:46 PM By: Fredirick Maudlin MD FACS Entered By: Fredirick Maudlin on 04/20/2022 15:42:45

## 2022-04-20 NOTE — Progress Notes (Signed)
AVRAHAM, BENISH ([id number]) [id number] Page 1 of 7 Visit Report for 04/20/2022 Arrival Information Details Patient Name: Date of Service: LAYKEN, DOENGES DO RE E. 04/20/2022 2:45 PM Medical Record Number: [id number] Patient Account Number: [id number] Date of Birth/Sex: Treating RN: [birth year] (89 y.o. [name] Primary Care Jayliani Wanner: [name] Other Clinician: Referring Madolin Twaddle: Treating Makailey Hodgkin/Extender: Sela Hua in Treatment: 4 Visit Information History Since Last Visit All ordered tests and consults were completed: No Patient Arrived: Ambulatory Added or deleted any medications: No Arrival Time: 14:50 Any new allergies or adverse reactions: No Accompanied By: wife Had a fall or experienced change in No Transfer Assistance: None activities of daily living that may affect Patient Identification Verified: Yes risk of falls: Secondary Verification Process Completed: Yes Signs or symptoms of abuse/neglect since last visito No Patient Has Alerts: Yes Hospitalized since last visit: No Patient Alerts: Patient on Blood Thinner Implantable device outside of the clinic excluding No Eliquis cellular tissue based products placed in the center since last visit: Pain Present Now: No Electronic Signature(s) Signed: 04/20/2022 4:03:05 PM By: Dellie Catholic RN Entered By: Dellie Catholic on 04/20/2022 14:54:39 -------------------------------------------------------------------------------- Encounter Discharge Information Details Patient Name: Date of Service: [name] 04/20/2022 2:45 PM Medical Record Number: [id number] Patient Account Number: [id number] Date of Birth/Sex: Treating RN: 1922/06/15 (89 y.o. [name] Primary Care Breta Demedeiros: [name] Other Clinician: Referring Mileydi Milsap: Treating Adonus Uselman/Extender: Sela Hua in Treatment: 4 Encounter Discharge Information Items Post  Procedure Vitals Discharge Condition: Stable Temperature (F): 98 Ambulatory Status: Ambulatory Pulse (bpm): 51 Discharge Destination: Home Respiratory Rate (breaths/min): 20 Transportation: Private Auto Blood Pressure (mmHg): 182/71 Accompanied By: Significant other Schedule Follow-up Appointment: Yes Clinical Summary of Care: Patient Declined Electronic Signature(s) Signed: 04/20/2022 4:03:05 PM By: Dellie Catholic RN Entered By: Dellie Catholic on 04/20/2022 15:51:06 Ritchie, [name] ([id number]) [id number] Page 2 of 7 -------------------------------------------------------------------------------- Lower Extremity Assessment Details Patient Name: Date of Service: KHIYAN, CRACE DO RE E. 04/20/2022 2:45 PM Medical Record Number: [id number] Patient Account Number: [id number] Date of Birth/Sex: Treating RN: [birth year] (89 y.o. [name] Primary Care Tamer Baughman: [name] Other Clinician: Referring Heidee Audi: Treating Menna Abeln/Extender: [name] Weeks in Treatment: 4 Edema Assessment Assessed: [Left: No] [Right: No] [Left: Edema] [Right: :] Calf Left: Right: Point of Measurement: From Medial Instep 39.9 cm Ankle Left: Right: Point of Measurement: From Medial Instep 21.7 cm Vascular Assessment Pulses: Dorsalis Pedis Palpable: [Right:Yes] Electronic Signature(s) Signed: 04/20/2022 4:03:05 PM By: Dellie Catholic RN Entered By: Dellie Catholic on 04/20/2022 15:05:19 -------------------------------------------------------------------------------- Multi Wound Chart Details Patient Name: Date of Service: [name] 04/20/2022 2:45 PM Medical Record Number: [id number] Patient Account Number: [id number] Date of Birth/Sex: Treating RN: 1922-05-19 (89 y.o. M) Primary Care Creg Gilmer: [name] Other Clinician: Referring Ayahna Solazzo: Treating Lesta Limbert/Extender: Sela Hua in Treatment: 4 Vital  Signs Height(in): 71 Pulse(bpm): 51 Weight(lbs): 167 Blood Pressure(mmHg): 182/72 Body Mass Index(BMI): 23.3 Temperature(F): 98.0 Respiratory Rate(breaths/min): 20 [1:Photos:] [N/A:N/A] Right, Anterior Lower Leg N/A N/A Wound Location: Trauma N/A N/A Wounding Event: Abrasion N/A N/A Primary Etiology: Arrhythmia, Hypertension N/A N/A Comorbid History: 03/01/2022 N/A N/A Date Acquired: 4 N/A N/A Weeks of Treatment: Open N/A N/A Wound Status: No N/A N/A Wound Recurrence: 2x1x0.1 N/A N/A Measurements L x W x D (cm) 1.571 N/A N/A A (cm) : rea 0.157 N/A N/A Volume (cm) : 83.30% N/A N/A % Reduction in A rea: 83.30% N/A N/A % Reduction in  Volume: Full Thickness Without Exposed N/A N/A Classification: Support Structures Medium N/A N/A Exudate A mount: Serosanguineous N/A N/A Exudate Type: red, brown N/A N/A Exudate Color: Distinct, outline attached N/A N/A Wound Margin: Large (67-100%) N/A N/A Granulation A mount: Pink N/A N/A Granulation Quality: Small (1-33%) N/A N/A Necrotic A mount: Eschar, Adherent Slough N/A N/A Necrotic Tissue: Fat Layer (Subcutaneous Tissue): Yes N/A N/A Exposed Structures: Fascia: No Tendon: No Muscle: No Joint: No Bone: No Medium (34-66%) N/A N/A Epithelialization: Debridement - Selective/Open Wound N/A N/A Debridement: Pre-procedure Verification/Time Out 15:09 N/A N/A Taken: Lidocaine 4% Topical Solution N/A N/A Pain Control: Necrotic/Eschar, Slough N/A N/A Tissue Debrided: Non-Viable Tissue N/A N/A Level: 2 N/A N/A Debridement A (sq cm): rea Curette N/A N/A Instrument: Minimum N/A N/A Bleeding: Pressure N/A N/A Hemostasis A chieved: 0 N/A N/A Procedural Pain: 0 N/A N/A Post Procedural Pain: Procedure was tolerated well N/A N/A Debridement Treatment Response: 2x1x0.1 N/A N/A Post Debridement Measurements L x W x D (cm) 0.157 N/A N/A Post Debridement Volume: (cm) Scarring: Yes N/A N/A Periwound Skin  Texture: No Abnormalities Noted N/A N/A Periwound Skin Moisture: Hemosiderin Staining: Yes N/A N/A Periwound Skin Color: No Abnormality N/A N/A Temperature: Debridement N/A N/A Procedures Performed: Treatment Notes Electronic Signature(s) Signed: 04/20/2022 3:35:34 PM By: Fredirick Maudlin MD FACS Entered By: Fredirick Maudlin on 04/20/2022 15:35:34 -------------------------------------------------------------------------------- Multi-Disciplinary Care Plan Details Patient Name: Date of Service: [name] 04/20/2022 2:45 PM Medical Record Number: [id number] Patient Account Number: [id number] Date of Birth/Sex: Treating RN: [birth year] (89 y.o. [name] Primary Care Vincie Linn: [name] Other Clinician: Referring Tharun Cappella: Treating Eeshan Verbrugge/Extender: Sela Hua in Treatment: [address] RAMAJ, FRANGOS ([id number]) [id number] Page 4 of 7 Pain, Acute or Chronic Nursing Diagnoses: Pain, acute or chronic: actual or potential Goals: Patient/caregiver will verbalize adequate pain control between visits Date Initiated: 03/19/2022 Target Resolution Date: 07/11/2022 Goal Status: Active Interventions: Encourage patient to take pain medications as prescribed Notes: Electronic Signature(s) Signed: 04/20/2022 4:03:05 PM By: Dellie Catholic RN Entered By: Dellie Catholic on 04/20/2022 15:49:33 -------------------------------------------------------------------------------- Pain Assessment Details Patient Name: Date of Service: [name] 04/20/2022 2:45 PM Medical Record Number: [id number] Patient Account Number: [id number] Date of Birth/Sex: Treating RN: July 14, 1922 (89 y.o. [name] Primary Care Koi Zangara: [name] Other Clinician: Referring Lillieanna Tuohy: Treating Cylah Fannin/Extender: Sela Hua in Treatment: 4 Active Problems Location of Pain Severity and Description of  Pain Patient Has Paino No Site Locations Pain Management and Medication Current Pain Management: Electronic Signature(s) Signed: 04/20/2022 4:03:05 PM By: Dellie Catholic RN Entered By: Dellie Catholic on 04/20/2022 15:04:55 Kann, [name] ([id number]) [id number] Page 5 of 7 -------------------------------------------------------------------------------- Patient/Caregiver Education Details Patient Name: Date of Service: TRONG, GOSLING DO RE E. 1/9/2024andnbsp2:45 PM Medical Record Number: [id number] Patient Account Number: [id number] Date of Birth/Gender: Treating RN: 08-30-1922 (89 y.o. [name] Primary Care Physician: [name] Other Clinician: Referring Physician: Treating Physician/Extender: Sela Hua in Treatment: 4 Education Assessment Education Provided To: Patient Education Topics Provided Wound/Skin Impairment: Methods: Explain/Verbal Responses: Return demonstration correctly Electronic Signature(s) Signed: 04/20/2022 4:03:05 PM By: Dellie Catholic RN Entered By: Dellie Catholic on 04/20/2022 15:49:48 -------------------------------------------------------------------------------- Wound Assessment Details Patient Name: Date of Service: [name] 04/20/2022 2:45 PM Medical Record Number: [id number] Patient Account Number: [id number] Date of Birth/Sex: Treating RN: 1922/08/02 (89 y.o. [name] Primary Care Rosaline Ezekiel: [name] Other Clinician: Referring Jazira Maloney: Treating Pauletta Pickney/Extender: Fredirick Maudlin  [name] Weeks in Treatment: 4 Wound Status Wound Number: 1 Primary Etiology: Abrasion Wound Location: Right, Anterior Lower Leg Wound Status: Open Wounding Event: Trauma Comorbid History: Arrhythmia, Hypertension Date Acquired: 03/01/2022 Weeks Of Treatment: 4 Clustered Wound: No Photos Wound Measurements Length: (cm) 2 Width: (cm) 1 Depth: (cm) 0 Area: (cm) Volume:  (cm) % Reduction in Area: 83.3% % Reduction in Volume: 83.3% .1 Epithelialization: Medium (34-66%) 1.571 Tunneling: No 0.157 Undermining: No Wound Description XHAIDEN, COOMBS ([id number]) Classification: Full Thickness Without Exposed Support Structures Wound Margin: Distinct, outline attached Exudate Amount: Medium Exudate Type: Serosanguineous Exudate Color: red, brown [id number].pdf Page 6 of 7 Foul Odor After Cleansing: No Slough/Fibrino Yes Wound Bed Granulation Amount: Large (67-100%) Exposed Structure Granulation Quality: Pink Fascia Exposed: No Necrotic Amount: Small (1-33%) Fat Layer (Subcutaneous Tissue) Exposed: Yes Necrotic Quality: Eschar, Adherent Slough Tendon Exposed: No Muscle Exposed: No Joint Exposed: No Bone Exposed: No Periwound Skin Texture Texture Color No Abnormalities Noted: No No Abnormalities Noted: No Scarring: Yes Hemosiderin Staining: Yes Moisture Temperature / Pain No Abnormalities Noted: Yes Temperature: No Abnormality Treatment Notes Wound #1 (Lower Leg) Wound Laterality: Right, Anterior Cleanser Peri-Wound Care Triamcinolone 15 (g) Discharge Instruction: Use triamcinolone 15 (g) as directed Sween Lotion (Moisturizing lotion) Discharge Instruction: Apply moisturizing lotion as directed Topical Primary Dressing IODOFLEX 0.9% Cadexomer Iodine Pad 4x6 cm Discharge Instruction: Apply to wound bed as instructed Secondary Dressing Zetuvit Plus 4x8 in Discharge Instruction: Apply over primary dressing as directed. Secured With Compression Wrap Kerlix Roll 4.5x3.1 (in/yd) Discharge Instruction: Apply Kerlix and Coban compression as directed. Coban Self-Adherent Wrap 4x5 (in/yd) Discharge Instruction: Apply over Kerlix as directed. Compression Stockings Add-Ons Electronic Signature(s) Signed: 04/20/2022 4:03:05 PM By: Dellie Catholic RN Entered By: Dellie Catholic on 04/20/2022  15:07:15 -------------------------------------------------------------------------------- Vitals Details Patient Name: Date of Service: [name] 04/20/2022 2:45 PM Medical Record Number: [id number] Patient Account Number: [id number] Date of Birth/Sex: Treating RN: October 03, 1922 (89 y.o. [name] Primary Care Mikiah Durall: [name] Other Clinician: Referring Zoiee Wimmer: Treating Shaquoia Miers/Extender: Cloyde, Oregel ([id number]) [id number] Page 7 of 7 Weeks in Treatment: 4 Vital Signs Time Taken: 14:54 Temperature (F): 98.0 Height (in): 71 Pulse (bpm): 51 Weight (lbs): 167 Respiratory Rate (breaths/min): 20 Body Mass Index (BMI): 23.3 Blood Pressure (mmHg): 182/72 Reference Range: 80 - 120 mg / dl Electronic Signature(s) Signed: 04/20/2022 4:03:05 PM By: Dellie Catholic RN Entered By: Dellie Catholic on 04/20/2022 14:56:45

## 2022-04-26 DIAGNOSIS — H353221 Exudative age-related macular degeneration, left eye, with active choroidal neovascularization: Secondary | ICD-10-CM | POA: Diagnosis not present

## 2022-04-27 ENCOUNTER — Encounter (HOSPITAL_BASED_OUTPATIENT_CLINIC_OR_DEPARTMENT_OTHER): Payer: Medicare Other | Admitting: General Surgery

## 2022-04-27 DIAGNOSIS — I5030 Unspecified diastolic (congestive) heart failure: Secondary | ICD-10-CM | POA: Diagnosis not present

## 2022-04-27 DIAGNOSIS — S80811A Abrasion, right lower leg, initial encounter: Secondary | ICD-10-CM | POA: Diagnosis not present

## 2022-04-27 DIAGNOSIS — N400 Enlarged prostate without lower urinary tract symptoms: Secondary | ICD-10-CM | POA: Diagnosis not present

## 2022-04-27 DIAGNOSIS — L97812 Non-pressure chronic ulcer of other part of right lower leg with fat layer exposed: Secondary | ICD-10-CM | POA: Diagnosis not present

## 2022-04-27 DIAGNOSIS — N1831 Chronic kidney disease, stage 3a: Secondary | ICD-10-CM | POA: Diagnosis not present

## 2022-04-27 DIAGNOSIS — K219 Gastro-esophageal reflux disease without esophagitis: Secondary | ICD-10-CM | POA: Diagnosis not present

## 2022-04-27 DIAGNOSIS — I130 Hypertensive heart and chronic kidney disease with heart failure and stage 1 through stage 4 chronic kidney disease, or unspecified chronic kidney disease: Secondary | ICD-10-CM | POA: Diagnosis not present

## 2022-04-27 NOTE — Progress Notes (Signed)
DELPHIN, FUNES ([id number]) [id number] Page 1 of 8 Visit Report for 04/27/2022 Chief Complaint Document Details Patient Name: Date of Service: [name], MARHEFKA DO RE [name] 04/27/2022 1:15 PM Medical Record Number: [id number] Patient Account Number: [id number] Date of Birth/Sex: Treating RN: 08-26-1922 (89 y.o. M) Primary Care Provider: [name] Other Clinician: Referring Provider: Treating Provider/Extender: Sela Hua in Treatment: 5 Information Obtained from: Patient Chief Complaint Patient seen for complaints of Non-Healing Wound. Electronic Signature(s) Signed: 04/27/2022 3:00:14 PM By: Fredirick Maudlin MD FACS Entered By: Fredirick Maudlin on 04/27/2022 15:00:14 -------------------------------------------------------------------------------- Debridement Details Patient Name: Date of Service: Gregg Cliche DO RE [name] 04/27/2022 1:15 PM Medical Record Number: [id number] Patient Account Number: [id number] Date of Birth/Sex: Treating RN: 05/22/1922 (89 y.o. [name] Primary Care Provider: [name] Other Clinician: Referring Provider: Treating Provider/Extender: Sela Hua in Treatment: 5 Debridement Performed for Assessment: Wound #1 Right,Anterior Lower Leg Performed By: Physician Fredirick Maudlin, MD Debridement Type: Debridement Level of Consciousness (Pre-procedure): Awake and Alert Pre-procedure Verification/Time Out Yes - 14:00 Taken: Start Time: 14:00 Pain Control: Lidocaine 5% topical ointment T Area Debrided (L x W): otal 1.7 (cm) x 0.6 (cm) = 1.02 (cm) Tissue and other material debrided: Non-Viable, Eschar, Slough, Slough Level: Non-Viable Tissue Debridement Description: Selective/Open Wound Instrument: Curette Bleeding: Minimum Hemostasis Achieved: Pressure End Time: 14:02 Procedural Pain: 0 Post Procedural Pain: 0 Response to Treatment: Procedure was tolerated well Level of  Consciousness (Post- Awake and Alert procedure): Post Debridement Measurements of Total Wound Length: (cm) 1.7 Width: (cm) 0.6 Depth: (cm) 0.1 Volume: (cm) 0.08 Character of Wound/Ulcer Post Debridement: Improved Post Procedure Diagnosis [name], [name] ([id number]) [id number] Page 2 of 8 Same as Pre-procedure Notes Scribed for Dr. Celine Ahr by J.Scotton Electronic Signature(s) Signed: 04/27/2022 3:55:34 PM By: Fredirick Maudlin MD FACS Signed: 04/27/2022 4:21:13 PM By: Dellie Catholic RN Entered By: Dellie Catholic on 04/27/2022 14:08:03 -------------------------------------------------------------------------------- HPI Details Patient Name: Date of Service: Gregg Cliche DO RE [name] 04/27/2022 1:15 PM Medical Record Number: [id number] Patient Account Number: [id number] Date of Birth/Sex: Treating RN: [birth year] (89 y.o. M) Primary Care Provider: [name] Other Clinician: Referring Provider: Treating Provider/Extender: Sela Hua in Treatment: 5 History of Present Illness HPI Description: ADMISSION 03/19/2022 This is a 89 year old man with a past medical history significant for coronary artery bypass with saphenous vein harvest from the right leg resulting in chronic edema, hypertension, stroke, paroxysmal atrial fibrillation on chronic anticoagulation, CHF, and stage III chronic kidney disease.. Near the beginning of November, he struck his leg on a step while getting into a bus. He developed a wound and went to urgent care but it did not seem to need any repair. It stayed open and he developed some discomfort and redness. There was some serous drainage and he presented to the emergency room on November 13. He was prescribed doxycycline and given bacitracin due to concern for infection. His PCP saw him on November 21 and recommended moist gauze dressing changes with Xeroform. He is accompanied today by his significant other who says she  has been dressing it with various topical agents including the mupirocin that was prescribed in the emergency room as well as Vaseline and a T pad. He was unable to tolerate ABIs today. elfa On his right anterior tibial surface, there is a wound that shows evidence of prior healing. There is a yellow dry eschar over the entire surface. Underneath this, there is some nonviable subcutaneous tissue and slough. He  has 1+ nonpitting edema in the leg. 12/15; second visit for this 6 year old man who lives in the friend's home [place] [facility] complex. His wound came from trauma after a almost fall getting on the activity bus.. Last week we used Iodoflex with 3 layer compression. He complains about pain in his right leg at night only which is relieved by either dangling his leg over the side of the bed or standing 04/13/2022: The wound is substantially smaller and shallower from when I saw it last. There is crusted Iodoflex along with rubbery slough on the wound surface. 04/20/2022: The wound is smaller and cleaner today. Still with a fair amount of rubbery slough, however. 04/27/2022: The wound is smaller and shallower today. Much less slough accumulation. Electronic Signature(s) Signed: 04/27/2022 3:00:40 PM By: Fredirick Maudlin MD FACS Entered By: Fredirick Maudlin on 04/27/2022 15:00:40 -------------------------------------------------------------------------------- Physical Exam Details Patient Name: Date of Service: Gregg Cliche DO RE [name] 04/27/2022 1:15 PM Medical Record Number: [id number] Patient Account Number: [id number] Date of Birth/Sex: Treating RN: 04/28/1922 (89 y.o. M) Primary Care Provider: [name] Other Clinician: Referring Provider: Treating Provider/Extender: Sela Hua in Treatment: 5 Constitutional no acute distress. [name], [name] ([id number]) [id number] Page 3 of 8 Respiratory Normal work of breathing on room  air. Notes 04/27/2022: The wound is smaller and shallower today. Much less slough accumulation. Electronic Signature(s) Signed: 04/27/2022 3:04:39 PM By: Fredirick Maudlin MD FACS Entered By: Fredirick Maudlin on 04/27/2022 15:04:39 -------------------------------------------------------------------------------- Physician Orders Details Patient Name: Date of Service: Gregg Cliche DO RE [name] 04/27/2022 1:15 PM Medical Record Number: [id number] Patient Account Number: [id number] Date of Birth/Sex: Treating RN: 08-Jul-1922 (89 y.o. [name] Primary Care Provider: [name] Other Clinician: Referring Provider: Treating Provider/Extender: Sela Hua in Treatment: 5 Verbal / Phone Orders: No Diagnosis Coding ICD-10 Coding Code Description [phone] Non-pressure chronic ulcer of other part of right lower leg with fat layer exposed I10 Essential (primary) hypertension R60.0 Localized edema N18.31 Chronic kidney disease, stage 3a I50.30 Unspecified diastolic (congestive) heart failure Follow-up Appointments ppointment in 1 week. - Dr. Celine Ahr - Room 3 Return A Anesthetic (In clinic) Topical Lidocaine 5% applied to wound bed - Used in Clinic Bathing/ Shower/ Hygiene May shower with protection but do not get wound dressing(s) wet. Protect dressing(s) with water repellant cover (for example, large plastic bag) or a cast cover and may then take shower. Edema Control - Lymphedema / SCD / Other void standing for long periods of time. - Elevate the legs throughout the day. A Wound Treatment Wound #1 - Lower Leg Wound Laterality: Right, Anterior Peri-Wound Care: Triamcinolone 15 (g) 1 x Per Week/30 Days Discharge Instructions: Use triamcinolone 15 (g) as directed Peri-Wound Care: Sween Lotion (Moisturizing lotion) 1 x Per Week/30 Days Discharge Instructions: Apply moisturizing lotion as directed Prim Dressing: Sorbalgon AG Dressing 2x2 (in/in) 1 x Per Week/30  Days ary Discharge Instructions: Apply to wound bed as instructed Secondary Dressing: Zetuvit Plus 4x8 in 1 x Per Week/30 Days Discharge Instructions: Apply over primary dressing as directed. Compression Wrap: Kerlix Roll 4.5x3.1 (in/yd) 1 x Per Week/30 Days Discharge Instructions: Apply Kerlix and Coban compression as directed. Compression Wrap: Coban Self-Adherent Wrap 4x5 (in/yd) 1 x Per Week/30 Days Discharge Instructions: Apply over Kerlix as directed. [name], [name] ([id number]) [id number] Page 4 of 8 Electronic Signature(s) Signed: 04/27/2022 3:55:34 PM By: Fredirick Maudlin MD FACS Entered By: Fredirick Maudlin on 04/27/2022 15:04:58 -------------------------------------------------------------------------------- Problem List Details Patient Name: Date of Service:  [name], THEO DO RE [name] 04/27/2022 1:15 PM Medical Record Number: [id number] Patient Account Number: [id number] Date of Birth/Sex: Treating RN: 03/09/1923 (89 y.o. M) Primary Care Provider: [name] Other Clinician: Referring Provider: Treating Provider/Extender: Sela Hua in Treatment: 5 Active Problems ICD-10 Encounter Code Description Active Date MDM Diagnosis [phone] Non-pressure chronic ulcer of other part of right lower leg with fat layer 03/19/2022 No Yes exposed [place] (primary) hypertension 03/19/2022 No Yes R60.0 Localized edema 03/19/2022 No Yes N18.31 Chronic kidney disease, stage 3a 03/19/2022 No Yes I50.30 Unspecified diastolic (congestive) heart failure 03/19/2022 No Yes Inactive Problems Resolved Problems Electronic Signature(s) Signed: 04/27/2022 2:59:56 PM By: Fredirick Maudlin MD FACS Entered By: Fredirick Maudlin on 04/27/2022 14:59:56 -------------------------------------------------------------------------------- Progress Note Details Patient Name: Date of Service: Gregg Cliche DO RE [name] 04/27/2022 1:15 PM Medical Record Number:  [id number] Patient Account Number: [id number] Date of Birth/Sex: Treating RN: 01-29-1923 (89 y.o. M) Primary Care Provider: [name] Other Clinician: Referring Provider: Treating Provider/Extender: Sela Hua in Treatment: 5 Subjective [name], [name] ([id number]) [id number] Page 5 of 8 Chief Complaint Information obtained from Patient Patient seen for complaints of Non-Healing Wound. History of Present Illness (HPI) ADMISSION 03/19/2022 This is a 89 year old man with a past medical history significant for coronary artery bypass with saphenous vein harvest from the right leg resulting in chronic edema, hypertension, stroke, paroxysmal atrial fibrillation on chronic anticoagulation, CHF, and stage III chronic kidney disease.. Near the beginning of November, he struck his leg on a step while getting into a bus. He developed a wound and went to urgent care but it did not seem to need any repair. It stayed open and he developed some discomfort and redness. There was some serous drainage and he presented to the emergency room on November 13. He was prescribed doxycycline and given bacitracin due to concern for infection. His PCP saw him on November 21 and recommended moist gauze dressing changes with Xeroform. He is accompanied today by his significant other who says she has been dressing it with various topical agents including the mupirocin that was prescribed in the emergency room as well as Vaseline and a T pad. He was unable to tolerate ABIs today. elfa On his right anterior tibial surface, there is a wound that shows evidence of prior healing. There is a yellow dry eschar over the entire surface. Underneath this, there is some nonviable subcutaneous tissue and slough. He has 1+ nonpitting edema in the leg. 12/15; second visit for this 87 year old man who lives in the friend's home [place] retirement complex. His wound came from trauma after  a almost fall getting on the activity bus.. Last week we used Iodoflex with 3 layer compression. He complains about pain in his right leg at night only which is relieved by either dangling his leg over the side of the bed or standing 04/13/2022: The wound is substantially smaller and shallower from when I saw it last. There is crusted Iodoflex along with rubbery slough on the wound surface. 04/20/2022: The wound is smaller and cleaner today. Still with a fair amount of rubbery slough, however. 04/27/2022: The wound is smaller and shallower today. Much less slough accumulation. Patient History Information obtained from Patient. Social History Never smoker, Marital Status - Married, Alcohol Use - Rarely, Drug Use - No History, Caffeine Use - Daily. Medical History Cardiovascular Patient has history of Arrhythmia - Hx: RBBB; Paroxysmal atrial Fib., Hypertension Hospitalization/Surgery History - T without cardioversion ; Hernia repair;2000  cardiac Bypass. ee Medical A Surgical History Notes nd Eyes Macular degeneration in Left eye Cardiovascular Hx: CASH with CABG 2000 with LIMA to LAD,SV graft. Hx: Hyperlipidemia Hx: stroke Gastrointestinal GERD Genitourinary Hx: BPH Objective Constitutional no acute distress. Respiratory Normal work of breathing on room air. General Notes: 04/27/2022: The wound is smaller and shallower today. Much less slough accumulation. Integumentary (Hair, Skin) Wound #1 status is Open. Original cause of wound was Trauma. The date acquired was: 03/01/2022. The wound has been in treatment 5 weeks. The wound is located on the Right,Anterior Lower Leg. The wound measures 1.7cm length x 0.6cm width x 0.1cm depth; 0.801cm^2 area and 0.08cm^3 volume. There is Fat Layer (Subcutaneous Tissue) exposed. There is no tunneling or undermining noted. There is a medium amount of serosanguineous drainage noted. The wound margin is distinct with the outline attached to the wound base.  There is large (67-100%) pink granulation within the wound bed. There is a small (1-33%) amount of necrotic tissue within the wound bed including Eschar and Adherent Slough. The periwound skin appearance had no abnormalities noted for moisture. The periwound skin appearance exhibited: Scarring, Hemosiderin Staining. Periwound temperature was noted as No Abnormality. Assessment [name], [name] ([id number]) [id number] Page 6 of 8 Active Problems ICD-10 Non-pressure chronic ulcer of other part of right lower leg with fat layer exposed Essential (primary) hypertension Localized edema Chronic kidney disease, stage 3a Unspecified diastolic (congestive) heart failure Procedures Wound #1 Pre-procedure diagnosis of Wound #1 is an Abrasion located on the Right,Anterior Lower Leg . There was a Selective/Open Wound Non-Viable Tissue Debridement with a total area of 1.02 sq cm performed by Fredirick Maudlin, MD. With the following instrument(s): Curette to remove Non-Viable tissue/material. Material removed includes Eschar and Slough and after achieving pain control using Lidocaine 5% topical ointment. No specimens were taken. A time out was conducted at 14:00, prior to the start of the procedure. A Minimum amount of bleeding was controlled with Pressure. The procedure was tolerated well with a pain level of 0 throughout and a pain level of 0 following the procedure. Post Debridement Measurements: 1.7cm length x 0.6cm width x 0.1cm depth; 0.08cm^3 volume. Character of Wound/Ulcer Post Debridement is improved. Post procedure Diagnosis Wound #1: Same as Pre-Procedure General Notes: Scribed for Dr. Celine Ahr by J.Scotton. Plan Follow-up Appointments: Return Appointment in 1 week. - Dr. Celine Ahr - Room 3 Anesthetic: (In clinic) Topical Lidocaine 5% applied to wound bed - Used in Clinic Bathing/ Shower/ Hygiene: May shower with protection but do not get wound dressing(s) wet.  Protect dressing(s) with water repellant cover (for example, large plastic bag) or a cast cover and may then take shower. Edema Control - Lymphedema / SCD / Other: Avoid standing for long periods of time. - Elevate the legs throughout the day. WOUND #1: - Lower Leg Wound Laterality: Right, Anterior Peri-Wound Care: Triamcinolone 15 (g) 1 x Per Week/30 Days Discharge Instructions: Use triamcinolone 15 (g) as directed Peri-Wound Care: Sween Lotion (Moisturizing lotion) 1 x Per Week/30 Days Discharge Instructions: Apply moisturizing lotion as directed Prim Dressing: Sorbalgon AG Dressing 2x2 (in/in) 1 x Per Week/30 Days ary Discharge Instructions: Apply to wound bed as instructed Secondary Dressing: Zetuvit Plus 4x8 in 1 x Per Week/30 Days Discharge Instructions: Apply over primary dressing as directed. Com pression Wrap: Kerlix Roll 4.5x3.1 (in/yd) 1 x Per Week/30 Days Discharge Instructions: Apply Kerlix and Coban compression as directed. Com pression Wrap: Coban Self-Adherent Wrap 4x5 (in/yd) 1 x Per Week/30 Days Discharge Instructions:  Apply over Kerlix as directed. 04/27/2022: The wound is smaller and shallower today. Much less slough accumulation. I used a curette to debride slough and eschar from the wound. I think it is clean enough now that we can stop using Iodoflex. We will use silver alginate under Kerlix and Coban wrapping. Follow-up in 1 week. Electronic Signature(s) Signed: 04/27/2022 3:05:27 PM By: Fredirick Maudlin MD FACS Entered By: Fredirick Maudlin on 04/27/2022 15:05:26 -------------------------------------------------------------------------------- HxROS Details Patient Name: Date of Service: Gregg Cliche DO RE [name] 04/27/2022 1:15 PM Medical Record Number: [id number] Patient Account Number: [id number] Date of Birth/Sex: Treating RN: [birth year] (89 y.o. M) Primary Care Provider: [name] Other Clinician: Referring Provider: Treating Provider/Extender: [name], [name] ([id number]) [id number] Page 7 of 8 Weeks in Treatment: 5 Information Obtained From Patient Eyes Medical History: Past Medical History Notes: Macular degeneration in Left eye Cardiovascular Medical History: Positive for: Arrhythmia - Hx: RBBB; Paroxysmal atrial Fib.; Hypertension Past Medical History Notes: Hx: CASH with CABG 2000 with LIMA to LAD,SV graft. Hx: Hyperlipidemia Hx: stroke Gastrointestinal Medical History: Past Medical History Notes: GERD Genitourinary Medical History: Past Medical History Notes: Hx: BPH Immunizations Pneumococcal Vaccine: Received Pneumococcal Vaccination: Yes Received Pneumococcal Vaccination On or After 60th Birthday: Yes Implantable Devices None Hospitalization / Surgery History Type of Hospitalization/Surgery T without cardioversion ; Hernia repair;2000 cardiac Bypass ee Family and Social History Never smoker; Marital Status - Married; Alcohol Use: Rarely; Drug Use: No History; Caffeine Use: Daily; Financial Concerns: No; Food, Clothing or Shelter Needs: No; Support System Lacking: No; Transportation Concerns: No Electronic Signature(s) Signed: 04/27/2022 3:55:34 PM By: Fredirick Maudlin MD FACS Entered By: Fredirick Maudlin on 04/27/2022 15:04:20 -------------------------------------------------------------------------------- SuperBill Details Patient Name: Date of Service: Gregg Cliche DO RE [name] 04/27/2022 Medical Record Number: [id number] Patient Account Number: [id number] Date of Birth/Sex: Treating RN: 10/04/1922 (89 y.o. M) Primary Care Provider: [name] Other Clinician: Referring Provider: Treating Provider/Extender: Sela Hua in Treatment: 5 Diagnosis Coding ICD-10 Codes Code Description [phone] Non-pressure chronic ulcer of other part of right lower leg with fat layer exposed [name], [name] ([id number])  [id number] Page 8 of 8 I10 Essential (primary) hypertension R60.0 Localized edema N18.31 Chronic kidney disease, stage 3a I50.30 Unspecified diastolic (congestive) heart failure Facility Procedures : CPT4 Code: [id number] Description: 36468 - DEBRIDE WOUND 1ST 20 SQ CM OR < ICD-10 Diagnosis Description E32.122 Non-pressure chronic ulcer of other part of right lower leg with fat layer expos Modifier: ed Quantity: 1 Physician Procedures : CPT4 Code Description Modifier [id number] - WC PHYS LEVEL 3 - EST PT 25 ICD-10 Diagnosis Description B04.888 Non-pressure chronic ulcer of other part of right lower leg with fat layer exposed R60.0 Localized edema I50.30 Unspecified diastolic  (congestive) heart failure N18.31 Chronic kidney disease, stage 3a Quantity: 1 : [id number] - WC PHYS DEBR WO ANESTH 20 SQ CM ICD-10 Diagnosis Description L97.812 Non-pressure chronic ulcer of other part of right lower leg with fat layer exposed Quantity: 1 Electronic Signature(s) Signed: 04/27/2022 3:05:44 PM By: Fredirick Maudlin MD FACS Entered By: Fredirick Maudlin on 04/27/2022 15:05:44

## 2022-04-27 NOTE — Progress Notes (Signed)
SHYLO, DILLENBECK ([id number]) [id number] Page 1 of 7 Visit Report for 04/27/2022 Arrival Information Details Patient Name: Date of Service: BALDEMAR, DADY DO RE E. 04/27/2022 1:15 PM Medical Record Number: [id number] Patient Account Number: [id number] Date of Birth/Sex: Treating RN: 08-Jul-1922 (89 y.o. [name] Primary Care Frankey Botting: [name] Other Clinician: Referring Trinten Boudoin: Treating Mazi Brailsford/Extender: Sela Hua in Treatment: 5 Visit Information History Since Last Visit Added or deleted any medications: No Patient Arrived: Ambulatory Any new allergies or adverse reactions: No Arrival Time: 13:35 Had a fall or experienced change in No Accompanied By: "significant other" activities of daily living that may affect Transfer Assistance: None risk of falls: Patient Identification Verified: Yes Signs or symptoms of abuse/neglect since last visito No Patient Has Alerts: Yes Hospitalized since last visit: No Patient Alerts: Patient on Blood Thinner Implantable device outside of the clinic excluding No Eliquis cellular tissue based products placed in the center since last visit: Has Dressing in Place as Prescribed: Yes Pain Present Now: No Electronic Signature(s) Signed: 04/27/2022 4:21:13 PM By: Dellie Catholic RN Entered By: Dellie Catholic on 04/27/2022 16:03:42 -------------------------------------------------------------------------------- Encounter Discharge Information Details Patient Name: Date of Service: [name] 04/27/2022 1:15 PM Medical Record Number: [id number] Patient Account Number: [id number] Date of Birth/Sex: Treating RN: 04-20-1922 (89 y.o. [name] Primary Care Tkai Large: [name] Other Clinician: Referring Othar Curto: Treating Chelbi Herber/Extender: Sela Hua in Treatment: 5 Encounter Discharge Information Items Post Procedure Vitals Discharge Condition:  Stable Temperature (F): 98.2 Ambulatory Status: Ambulatory Pulse (bpm): 48 Discharge Destination: Home Respiratory Rate (breaths/min): 18 Transportation: Private Auto Blood Pressure (mmHg): 146/70 Accompanied By: significant other Schedule Follow-up Appointment: Yes Clinical Summary of Care: Patient Declined Electronic Signature(s) Signed: 04/27/2022 4:21:13 PM By: Dellie Catholic RN Entered By: Dellie Catholic on 04/27/2022 16:07:57 [place]ne, [name] ([id number]) [id number] Page 2 of 7 -------------------------------------------------------------------------------- Lower Extremity Assessment Details Patient Name: Date of Service: ARLEIGH, ODOWD DO RE E. 04/27/2022 1:15 PM Medical Record Number: [id number] Patient Account Number: [id number] Date of Birth/Sex: Treating RN: December 18, 1922 (89 y.o. [name] Primary Care Neli Fofana: [name] Other Clinician: Referring Dewarren Ledbetter: Treating Meg Niemeier/Extender: [name] Weeks in Treatment: 5 Edema Assessment Assessed: [Left: No] [Right: No] [Left: Edema] [Right: :] Calf Left: Right: Point of Measurement: From Medial Instep 39.8 cm Ankle Left: Right: Point of Measurement: From Medial Instep 21.5 cm Vascular Assessment Pulses: Dorsalis Pedis Palpable: [Right:Yes] Electronic Signature(s) Signed: 04/27/2022 4:21:13 PM By: Dellie Catholic RN Entered By: Dellie Catholic on 04/27/2022 16:04:54 -------------------------------------------------------------------------------- Multi Wound Chart Details Patient Name: Date of Service: [name] 04/27/2022 1:15 PM Medical Record Number: [id number] Patient Account Number: [id number] Date of Birth/Sex: Treating RN: 07/04/1922 (89 y.o. M) Primary Care Quintavious Rinck: [name] Other Clinician: Referring Yamilette Garretson: Treating Lyndell Allaire/Extender: Sela Hua in Treatment: 5 [1:Photos:] [N/A:N/A] Right, Anterior Lower Leg N/A  N/A Wound Location: Trauma N/A N/A Wounding Event: Abrasion N/A N/A Primary Etiology: Arrhythmia, Hypertension N/A N/A Comorbid History: 03/01/2022 N/A N/A Date Acquired: 5 N/A N/A Weeks of Treatment: Open N/A N/A Wound Status: ELDO, UMANZOR ([id number]) [id number] Page 3 of 7 No N/A N/A Wound Recurrence: 1.7x0.6x0.1 N/A N/A Measurements L x W x D (cm) 0.801 N/A N/A A (cm) : rea 0.08 N/A N/A Volume (cm) : 91.50% N/A N/A % Reduction in Area: 91.50% N/A N/A % Reduction in Volume: Full Thickness Without Exposed N/A N/A Classification: Support Structures Medium N/A N/A Exudate A mount: Serosanguineous  N/A N/A Exudate Type: red, brown N/A N/A Exudate Color: Distinct, outline attached N/A N/A Wound Margin: Large (67-100%) N/A N/A Granulation A mount: Pink N/A N/A Granulation Quality: Small (1-33%) N/A N/A Necrotic A mount: Eschar, Adherent Slough N/A N/A Necrotic Tissue: Fat Layer (Subcutaneous Tissue): Yes N/A N/A Exposed Structures: Fascia: No Tendon: No Muscle: No Joint: No Bone: No Medium (34-66%) N/A N/A Epithelialization: Debridement - Selective/Open Wound N/A N/A Debridement: Pre-procedure Verification/Time Out 14:00 N/A N/A Taken: Lidocaine 5% topical ointment N/A N/A Pain Control: Necrotic/Eschar, Slough N/A N/A Tissue Debrided: Non-Viable Tissue N/A N/A Level: 1.02 N/A N/A Debridement A (sq cm): rea Curette N/A N/A Instrument: Minimum N/A N/A Bleeding: Pressure N/A N/A Hemostasis A chieved: 0 N/A N/A Procedural Pain: 0 N/A N/A Post Procedural Pain: Procedure was tolerated well N/A N/A Debridement Treatment Response: 1.7x0.6x0.1 N/A N/A Post Debridement Measurements L x W x D (cm) 0.08 N/A N/A Post Debridement Volume: (cm) Scarring: Yes N/A N/A Periwound Skin Texture: No Abnormalities Noted N/A N/A Periwound Skin Moisture: Hemosiderin Staining: Yes N/A N/A Periwound Skin Color: No  Abnormality N/A N/A Temperature: Debridement N/A N/A Procedures Performed: Treatment Notes Electronic Signature(s) Signed: 04/27/2022 3:00:08 PM By: Fredirick Maudlin MD FACS Entered By: Fredirick Maudlin on 04/27/2022 15:00:08 -------------------------------------------------------------------------------- Multi-Disciplinary Care Plan Details Patient Name: Date of Service: [name] 04/27/2022 1:15 PM Medical Record Number: [id number] Patient Account Number: [id number] Date of Birth/Sex: Treating RN: 1923/01/22 (89 y.o. [name] Primary Care Yojan Paskett: [name] Other Clinician: Referring Jearlene Bridwell: Treating Honestie Kulik/Extender: Sela Hua in Treatment: 5 Active Inactive Pain, Acute or Chronic Nursing Diagnoses: Pain, acute or chronic: actual or potential Goals: Patient/caregiver will verbalize adequate pain control between visits DECARI, DUGGAR ([id number]) [id number] Page 4 of 7 Date Initiated: 03/19/2022 Target Resolution Date: 07/11/2022 Goal Status: Active Interventions: Encourage patient to take pain medications as prescribed Notes: Electronic Signature(s) Signed: 04/27/2022 4:21:13 PM By: Dellie Catholic RN Entered By: Dellie Catholic on 04/27/2022 16:05:08 -------------------------------------------------------------------------------- Pain Assessment Details Patient Name: Date of Service: [name] 04/27/2022 1:15 PM Medical Record Number: [id number] Patient Account Number: [id number] Date of Birth/Sex: Treating RN: 12/15/1922 (89 y.o. [name] Primary Care Tylor Courtwright: [name] Other Clinician: Referring Durelle Zepeda: Treating Gaelen Brager/Extender: Sela Hua in Treatment: 5 Active Problems Location of Pain Severity and Description of Pain Patient Has Paino No Site Locations Pain Management and Medication Current Pain Management: Electronic  Signature(s) Signed: 04/27/2022 4:21:13 PM By: Dellie Catholic RN Entered By: Dellie Catholic on 04/27/2022 16:04:39 -------------------------------------------------------------------------------- Patient/Caregiver Education Details Patient Name: Date of Service: [name] 1/16/2024andnbsp1:15 PM Medical Record Number: [id number] Patient Account Number: [id number] Date of Birth/Gender: Treating RN: 11/09/1922 (89 y.o. [name] Primary Care Physician: [name] Other Clinician: Referring Physician: Treating Physician/Extender: Jamespaul, Secrist ([id number]) [id number] Page 5 of 7 Weeks in Treatment: 5 Education Assessment Education Provided To: Patient Education Topics Provided Wound/Skin Impairment: Methods: Explain/Verbal Responses: Return demonstration correctly Electronic Signature(s) Signed: 04/27/2022 4:21:13 PM By: Dellie Catholic RN Entered By: Dellie Catholic on 04/27/2022 16:05:41 -------------------------------------------------------------------------------- Wound Assessment Details Patient Name: Date of Service: [name] 04/27/2022 1:15 PM Medical Record Number: [id number] Patient Account Number: [id number] Date of Birth/Sex: Treating RN: 1922-12-10 (89 y.o. [name] Primary Care Jaimie Pippins: [name] Other Clinician: Referring Lorisa Scheid: Treating Taariq Leitz/Extender: Sela Hua in Treatment: 5 Wound Status Wound Number: 1 Primary Etiology: Abrasion Wound Location: Right,  Anterior Lower Leg Wound Status: Open Wounding Event: Trauma Comorbid History: Arrhythmia, Hypertension Date Acquired: 03/01/2022 Weeks Of Treatment: 5 Clustered Wound: No Photos Wound Measurements Length: (cm) 1.7 Width: (cm) 0.6 Depth: (cm) 0.1 Area: (cm) 0.801 Volume: (cm) 0.08 % Reduction in Area: 91.5% % Reduction in Volume: 91.5% Epithelialization: Medium  (34-66%) Tunneling: No Undermining: No Wound Description Classification: Full Thickness Without Exposed Support Wound Margin: Distinct, outline attached Exudate Amount: Medium Exudate Type: Serosanguineous Exudate Color: red, brown Structures Foul Odor After Cleansing: No Slough/Fibrino Yes Wound Bed NORM, WRAY E ([id number]) [id number] Page 6 of 7 Granulation Amount: Large (67-100%) Exposed Structure Granulation Quality: Pink Fascia Exposed: No Necrotic Amount: Small (1-33%) Fat Layer (Subcutaneous Tissue) Exposed: Yes Necrotic Quality: Eschar, Adherent Slough Tendon Exposed: No Muscle Exposed: No Joint Exposed: No Bone Exposed: No Periwound Skin Texture Texture Color No Abnormalities Noted: No No Abnormalities Noted: No Scarring: Yes Hemosiderin Staining: Yes Moisture Temperature / Pain No Abnormalities Noted: Yes Temperature: No Abnormality Treatment Notes Wound #1 (Lower Leg) Wound Laterality: Right, Anterior Cleanser Peri-Wound Care Triamcinolone 15 (g) Discharge Instruction: Use triamcinolone 15 (g) as directed Sween Lotion (Moisturizing lotion) Discharge Instruction: Apply moisturizing lotion as directed Topical Primary Dressing Sorbalgon AG Dressing 2x2 (in/in) Discharge Instruction: Apply to wound bed as instructed Secondary Dressing Zetuvit Plus 4x8 in Discharge Instruction: Apply over primary dressing as directed. Secured With Compression Wrap Kerlix Roll 4.5x3.1 (in/yd) Discharge Instruction: Apply Kerlix and Coban compression as directed. Coban Self-Adherent Wrap 4x5 (in/yd) Discharge Instruction: Apply over Kerlix as directed. Compression Stockings Add-Ons Electronic Signature(s) Signed: 04/27/2022 4:21:13 PM By: Dellie Catholic RN Entered By: Dellie Catholic on 04/27/2022 13:55:32 -------------------------------------------------------------------------------- Vitals Details Patient Name: Date of Service: [name] 04/27/2022 1:15 PM Medical Record Number: [id number] Patient Account Number: [id number] Date of Birth/Sex: Treating RN: Oct 14, 1922 (89 y.o. [name] Primary Care Astin Rape: [name] Other Clinician: Referring Caleesi Kohl: Treating Kiaraliz Rafuse/Extender: Sela Hua in Treatment: 5 Vital Signs Time Taken: 13:40 Temperature (F): 98.2 Height (in): 71 Pulse (bpm): 48 Weight (lbs): 167 Respiratory Rate (breaths/min): 18 Vicknair, Josip E ([id number]) [id number] Page 7 of 7 Body Mass Index (BMI): 23.3 Blood Pressure (mmHg): 146/70 Reference Range: 80 - 120 mg / dl Electronic Signature(s) Signed: 04/27/2022 4:21:13 PM By: Dellie Catholic RN Entered By: Dellie Catholic on 04/27/2022 16:04:32

## 2022-05-04 ENCOUNTER — Encounter (HOSPITAL_BASED_OUTPATIENT_CLINIC_OR_DEPARTMENT_OTHER): Payer: Medicare Other | Admitting: General Surgery

## 2022-05-04 DIAGNOSIS — N1831 Chronic kidney disease, stage 3a: Secondary | ICD-10-CM | POA: Diagnosis not present

## 2022-05-04 DIAGNOSIS — I130 Hypertensive heart and chronic kidney disease with heart failure and stage 1 through stage 4 chronic kidney disease, or unspecified chronic kidney disease: Secondary | ICD-10-CM | POA: Diagnosis not present

## 2022-05-04 DIAGNOSIS — N400 Enlarged prostate without lower urinary tract symptoms: Secondary | ICD-10-CM | POA: Diagnosis not present

## 2022-05-04 DIAGNOSIS — S80811A Abrasion, right lower leg, initial encounter: Secondary | ICD-10-CM | POA: Diagnosis not present

## 2022-05-04 DIAGNOSIS — I5030 Unspecified diastolic (congestive) heart failure: Secondary | ICD-10-CM | POA: Diagnosis not present

## 2022-05-04 DIAGNOSIS — K219 Gastro-esophageal reflux disease without esophagitis: Secondary | ICD-10-CM | POA: Diagnosis not present

## 2022-05-04 DIAGNOSIS — L97812 Non-pressure chronic ulcer of other part of right lower leg with fat layer exposed: Secondary | ICD-10-CM | POA: Diagnosis not present

## 2022-05-04 NOTE — Progress Notes (Signed)
[name] ([id number]) [id number] Page 1 of 8 Visit Report for 05/04/2022 Chief Complaint Document Details Patient Name: Date of Service: [name], [name] 05/04/2022 2:15 PM Medical Record Number: [id number] Patient Account Number: [id number] Date of Birth/Sex: Treating RN: 10-30-1922 (89 y.o. M) Primary Care Provider: [name] Other Clinician: Referring Provider: Treating Provider/Extender: Sela Hua in Treatment: 6 Information Obtained from: Patient Chief Complaint Patient seen for complaints of Non-Healing Wound. Electronic Signature(s) Signed: 05/04/2022 3:39:13 PM By: Fredirick Maudlin MD FACS Entered By: Fredirick Maudlin on 05/04/2022 15:39:13 -------------------------------------------------------------------------------- Debridement Details Patient Name: Date of Service: Gregg Cliche DO RE E. 05/04/2022 2:15 PM Medical Record Number: [id number] Patient Account Number: [id number] Date of Birth/Sex: Treating RN: 21-Dec-1922 (89 y.o. [name] Primary Care Provider: [name] Other Clinician: Referring Provider: Treating Provider/Extender: Sela Hua in Treatment: 6 Debridement Performed for Assessment: Wound #1 Right,Anterior Lower Leg Performed By: Physician Fredirick Maudlin, MD Debridement Type: Debridement Level of Consciousness (Pre-procedure): Awake and Alert Pre-procedure Verification/Time Out Yes - 14:45 Taken: Start Time: 14:45 Pain Control: Lidocaine 5% topical ointment T Area Debrided (L x W): otal 1.4 (cm) x 0.6 (cm) = 0.84 (cm) Tissue and other material debrided: Non-Viable, Eschar, Slough, Subcutaneous, Slough Level: Skin/Subcutaneous Tissue Debridement Description: Excisional Instrument: Curette Bleeding: Minimum Hemostasis Achieved: Pressure End Time: 14:47 Procedural Pain: 0 Post Procedural Pain: 0 Response to Treatment: Procedure was tolerated well Level  of Consciousness (Post- Awake and Alert procedure): Post Debridement Measurements of Total Wound Length: (cm) 1.4 Width: (cm) 0.6 Depth: (cm) 0.1 Volume: (cm) 0.066 Character of Wound/Ulcer Post Debridement: Improved Post Procedure Diagnosis BRODEE, Gregg E ([id number]) [id number] Page 2 of 8 Same as Pre-procedure Notes Scribed for Dr. Celine Ahr by J.Scotton Electronic Signature(s) Signed: 05/04/2022 3:28:26 PM By: Dellie Catholic RN Signed: 05/04/2022 4:15:16 PM By: Fredirick Maudlin MD FACS Entered By: Dellie Catholic on 05/04/2022 15:24:28 -------------------------------------------------------------------------------- HPI Details Patient Name: Date of Service: Gregg Cliche DO RE E. 05/04/2022 2:15 PM Medical Record Number: [id number] Patient Account Number: [id number] Date of Birth/Sex: Treating RN: 10-May-1922 (89 y.o. M) Primary Care Provider: [name] Other Clinician: Referring Provider: Treating Provider/Extender: Sela Hua in Treatment: 6 History of Present Illness HPI Description: ADMISSION 03/19/2022 This is a 89 year old man with a past medical history significant for coronary artery bypass with saphenous vein harvest from the right leg resulting in chronic edema, hypertension, stroke, paroxysmal atrial fibrillation on chronic anticoagulation, CHF, and stage III chronic kidney disease.. Near the beginning of November, he struck his leg on a step while getting into a bus. He developed a wound and went to urgent care but it did not seem to need any repair. It stayed open and he developed some discomfort and redness. There was some serous drainage and he presented to the emergency room on November 13. He was prescribed doxycycline and given bacitracin due to concern for infection. His PCP saw him on November 21 and recommended moist gauze dressing changes with Xeroform. He is accompanied today by his significant other who says  she has been dressing it with various topical agents including the mupirocin that was prescribed in the emergency room as well as Vaseline and a T pad. He was unable to tolerate ABIs today. elfa On his right anterior tibial surface, there is a wound that shows evidence of prior healing. There is a yellow dry eschar over the entire surface. Underneath this, there is some nonviable subcutaneous tissue and slough. He  has 1+ nonpitting edema in the leg. 12/15; second visit for this 70 year old man who lives in the friend's home [place] [facility] complex. His wound came from trauma after a almost fall getting on the activity bus.. Last week we used Iodoflex with 3 layer compression. He complains about pain in his right leg at night only which is relieved by either dangling his leg over the side of the bed or standing 04/13/2022: The wound is substantially smaller and shallower from when I saw it last. There is crusted Iodoflex along with rubbery slough on the wound surface. 04/20/2022: The wound is smaller and cleaner today. Still with a fair amount of rubbery slough, however. 04/27/2022: The wound is smaller and shallower today. Much less slough accumulation. 05/04/2022: The wound continues to contract. It is fairly clean with just a little slough and eschar accumulation. Edema control is inadequate. Electronic Signature(s) Signed: 05/04/2022 3:40:02 PM By: Fredirick Maudlin MD FACS Entered By: Fredirick Maudlin on 05/04/2022 15:40:02 -------------------------------------------------------------------------------- Physical Exam Details Patient Name: Date of Service: Gregg Cliche DO RE E. 05/04/2022 2:15 PM Medical Record Number: [id number] Patient Account Number: [id number] Date of Birth/Sex: Treating RN: [birth year] (89 y.o. M) Primary Care Provider: [name] Other Clinician: Referring Provider: Treating Provider/Extender: Sela Hua in Treatment: 6 ZIMIR, [name] ([id number])  [id number] Page 3 of 8 Constitutional Hypertensive, asymptomatic. Slightly bradycardic. Respiratory Normal work of breathing on room air. Notes 05/04/2022: The wound continues to contract. It is fairly clean with just a little slough and eschar accumulation. Edema control is inadequate. Electronic Signature(s) Signed: 05/04/2022 3:40:49 PM By: Fredirick Maudlin MD FACS Entered By: Fredirick Maudlin on 05/04/2022 15:40:48 -------------------------------------------------------------------------------- Physician Orders Details Patient Name: Date of Service: Gregg Cliche DO RE E. 05/04/2022 2:15 PM Medical Record Number: [id number] Patient Account Number: [id number] Date of Birth/Sex: Treating RN: [birth year] (89 y.o. [name] Primary Care Provider: [name] Other Clinician: Referring Provider: Treating Provider/Extender: Sela Hua in Treatment: 6 Verbal / Phone Orders: No Diagnosis Coding ICD-10 Coding Code Description [phone] Non-pressure chronic ulcer of other part of right lower leg with fat layer exposed I10 Essential (primary) hypertension R60.0 Localized edema N18.31 Chronic kidney disease, stage 3a I50.30 Unspecified diastolic (congestive) heart failure Follow-up Appointments ppointment in 1 week. - Dr. Celine Ahr - Room 3 Return A Anesthetic (In clinic) Topical Lidocaine 5% applied to wound bed - Used in Clinic Bathing/ Shower/ Hygiene May shower with protection but do not get wound dressing(s) wet. Protect dressing(s) with water repellant cover (for example, large plastic bag) or a cast cover and may then take shower. Edema Control - Lymphedema / SCD / Other void standing for long periods of time. - Elevate the legs throughout the day. A Wound Treatment Wound #1 - Lower Leg Wound Laterality: Right, Anterior Peri-Wound Care: Triamcinolone 15 (g) 1 x Per Week/30 Days Discharge Instructions: Use triamcinolone 15 (g)  as directed Peri-Wound Care: Sween Lotion (Moisturizing lotion) 1 x Per Week/30 Days Discharge Instructions: Apply moisturizing lotion as directed Prim Dressing: Sorbalgon AG Dressing 2x2 (in/in) 1 x Per Week/30 Days ary Discharge Instructions: Apply to wound bed as instructed Secondary Dressing: Zetuvit Plus 4x8 in 1 x Per Week/30 Days Discharge Instructions: Apply over primary dressing as directed. Compression Wrap: Kerlix Roll 4.5x3.1 (in/yd) 1 x Per Week/30 Days Discharge Instructions: Apply Kerlix and Coban compression as directed. Compression Wrap: Coban Self-Adherent Wrap 4x5 (in/yd) 1 x Per Week/30 Days Discharge Instructions: Apply over Kerlix as directed. Wolfrey, Timoty E (  [id number]) [id number] Page 4 of 8 Electronic Signature(s) Signed: 05/04/2022 4:15:16 PM By: Fredirick Maudlin MD FACS Previous Signature: 05/04/2022 3:28:26 PM Version By: Dellie Catholic RN Entered By: Fredirick Maudlin on 05/04/2022 15:43:19 -------------------------------------------------------------------------------- Problem List Details Patient Name: Date of Service: Gregg Cliche DO RE E. 05/04/2022 2:15 PM Medical Record Number: [id number] Patient Account Number: [id number] Date of Birth/Sex: Treating RN: July 19, 1922 (89 y.o. Ernestene Mention Primary Care Provider: [name] Other Clinician: Referring Provider: Treating Provider/Extender: Sela Hua in Treatment: 6 Active Problems ICD-10 Encounter Code Description Active Date MDM Diagnosis [phone] Non-pressure chronic ulcer of other part of right lower leg with fat layer 03/19/2022 No Yes exposed [place] (primary) hypertension 03/19/2022 No Yes R60.0 Localized edema 03/19/2022 No Yes N18.31 Chronic kidney disease, stage 3a 03/19/2022 No Yes I50.30 Unspecified diastolic (congestive) heart failure 03/19/2022 No Yes Inactive Problems Resolved Problems Electronic Signature(s) Signed: 05/04/2022  3:39:00 PM By: Fredirick Maudlin MD FACS Entered By: Fredirick Maudlin on 05/04/2022 15:39:00 -------------------------------------------------------------------------------- Progress Note Details Patient Name: Date of Service: Gregg Cliche DO RE E. 05/04/2022 2:15 PM Medical Record Number: [id number] Patient Account Number: [id number] Date of Birth/Sex: Treating RN: March 27, 1923 (89 y.o. M) Primary Care Provider: [name] Other Clinician: Referring Provider: Treating Provider/Extender: Spenser, Harren ([id number]) [id number] Page 5 of 8 Weeks in Treatment: 6 Subjective Chief Complaint Information obtained from Patient Patient seen for complaints of Non-Healing Wound. History of Present Illness (HPI) ADMISSION 03/19/2022 This is a 89 year old man with a past medical history significant for coronary artery bypass with saphenous vein harvest from the right leg resulting in chronic edema, hypertension, stroke, paroxysmal atrial fibrillation on chronic anticoagulation, CHF, and stage III chronic kidney disease.. Near the beginning of November, he struck his leg on a step while getting into a bus. He developed a wound and went to urgent care but it did not seem to need any repair. It stayed open and he developed some discomfort and redness. There was some serous drainage and he presented to the emergency room on November 13. He was prescribed doxycycline and given bacitracin due to concern for infection. His PCP saw him on November 21 and recommended moist gauze dressing changes with Xeroform. He is accompanied today by his significant other who says she has been dressing it with various topical agents including the mupirocin that was prescribed in the emergency room as well as Vaseline and a T pad. He was unable to tolerate ABIs today. elfa On his right anterior tibial surface, there is a wound that shows evidence of prior healing. There is  a yellow dry eschar over the entire surface. Underneath this, there is some nonviable subcutaneous tissue and slough. He has 1+ nonpitting edema in the leg. 12/15; second visit for this 89 year old man who lives in the friend's home [place] [facility] complex. His wound came from trauma after a almost fall getting on the activity bus.. Last week we used Iodoflex with 3 layer compression. He complains about pain in his right leg at night only which is relieved by either dangling his leg over the side of the bed or standing 04/13/2022: The wound is substantially smaller and shallower from when I saw it last. There is crusted Iodoflex along with rubbery slough on the wound surface. 04/20/2022: The wound is smaller and cleaner today. Still with a fair amount of rubbery slough, however. 04/27/2022: The wound is smaller and shallower today. Much less slough accumulation. 05/04/2022: The wound continues to contract.  It is fairly clean with just a little slough and eschar accumulation. Edema control is inadequate. Patient History Information obtained from Patient. Social History Never smoker, Marital Status - Married, Alcohol Use - Rarely, Drug Use - No History, Caffeine Use - Daily. Medical History Cardiovascular Patient has history of Arrhythmia - Hx: RBBB; Paroxysmal atrial Fib., Hypertension Hospitalization/Surgery History - T without cardioversion ; Hernia repair;2000 cardiac Bypass. ee Medical A Surgical History Notes nd Eyes Macular degeneration in Left eye Cardiovascular Hx: CASH with CABG 2000 with LIMA to LAD,SV graft. Hx: Hyperlipidemia Hx: stroke Gastrointestinal GERD Genitourinary Hx: BPH Objective Constitutional Hypertensive, asymptomatic. Slightly bradycardic. Vitals Time Taken: 2:15 AM, Height: 71 in, Weight: 167 lbs, BMI: 23.3, Temperature: 98.2 F, Pulse: 58 bpm, Respiratory Rate: 20 breaths/min, Blood Pressure: 180/63 mmHg. Respiratory Normal work of breathing on room  air. General Notes: 05/04/2022: The wound continues to contract. It is fairly clean with just a little slough and eschar accumulation. Edema control is inadequate. Integumentary (Hair, Skin) Wound #1 status is Open. Original cause of wound was Trauma. The date acquired was: 03/01/2022. The wound has been in treatment 6 weeks. The wound is located on the Right,Anterior Lower Leg. The wound measures 1.4cm length x 0.6cm width x 0.1cm depth; 0.66cm^2 area and 0.066cm^3 volume. There is Fat Layer (Subcutaneous Tissue) exposed. There is no tunneling or undermining noted. There is a medium amount of serosanguineous drainage noted. The wound margin is distinct with the outline attached to the wound base. There is small (1-33%) pink granulation within the wound bed. There is a large (67-100%) amount TREVINO, WYATT E ([id number]) [id number] Page 6 of 8 of necrotic tissue within the wound bed including Adherent Slough. The periwound skin appearance had no abnormalities noted for moisture. The periwound skin appearance exhibited: Scarring, Hemosiderin Staining. Periwound temperature was noted as No Abnormality. Assessment Active Problems ICD-10 Non-pressure chronic ulcer of other part of right lower leg with fat layer exposed Essential (primary) hypertension Localized edema Chronic kidney disease, stage 3a Unspecified diastolic (congestive) heart failure Procedures Wound #1 Pre-procedure diagnosis of Wound #1 is an Abrasion located on the Right,Anterior Lower Leg . There was a Excisional Skin/Subcutaneous Tissue Debridement with a total area of 0.84 sq cm performed by Fredirick Maudlin, MD. With the following instrument(s): Curette to remove Non-Viable tissue/material. Material removed includes Eschar, Subcutaneous Tissue, and Slough after achieving pain control using Lidocaine 5% topical ointment. No specimens were taken. A time out was conducted at 14:45, prior to the start of  the procedure. A Minimum amount of bleeding was controlled with Pressure. The procedure was tolerated well with a pain level of 0 throughout and a pain level of 0 following the procedure. Post Debridement Measurements: 1.4cm length x 0.6cm width x 0.1cm depth; 0.066cm^3 volume. Character of Wound/Ulcer Post Debridement is improved. Post procedure Diagnosis Wound #1: Same as Pre-Procedure General Notes: Scribed for Dr. Celine Ahr by J.Scotton. Plan Follow-up Appointments: Return Appointment in 1 week. - Dr. Celine Ahr - Room 3 Anesthetic: (In clinic) Topical Lidocaine 5% applied to wound bed - Used in Clinic Bathing/ Shower/ Hygiene: May shower with protection but do not get wound dressing(s) wet. Protect dressing(s) with water repellant cover (for example, large plastic bag) or a cast cover and may then take shower. Edema Control - Lymphedema / SCD / Other: Avoid standing for long periods of time. - Elevate the legs throughout the day. WOUND #1: - Lower Leg Wound Laterality: Right, Anterior Peri-Wound Care: Triamcinolone 15 (g) 1 x Per Week/30  Days Discharge Instructions: Use triamcinolone 15 (g) as directed Peri-Wound Care: Sween Lotion (Moisturizing lotion) 1 x Per Week/30 Days Discharge Instructions: Apply moisturizing lotion as directed Prim Dressing: Sorbalgon AG Dressing 2x2 (in/in) 1 x Per Week/30 Days ary Discharge Instructions: Apply to wound bed as instructed Secondary Dressing: Zetuvit Plus 4x8 in 1 x Per Week/30 Days Discharge Instructions: Apply over primary dressing as directed. Com pression Wrap: Kerlix Roll 4.5x3.1 (in/yd) 1 x Per Week/30 Days Discharge Instructions: Apply Kerlix and Coban compression as directed. Com pression Wrap: Coban Self-Adherent Wrap 4x5 (in/yd) 1 x Per Week/30 Days Discharge Instructions: Apply over Kerlix as directed. 05/04/2022: The wound continues to contract. It is fairly clean with just a little slough and eschar accumulation. Edema control is  inadequate. I used a curette to debride eschar, slough, and nonviable subcutaneous tissue from the wound. He would benefit from greater compression, but he has a diminished ABI so we will continue with Kerlix and Coban for now. We may need to pursue formal vascular studies if the wound stalls. Follow-up in 1 week. Electronic Signature(s) Signed: 05/04/2022 3:44:16 PM By: Fredirick Maudlin MD FACS Entered By: Fredirick Maudlin on 05/04/2022 15:44:15 [place], [name] ([id number]) [id number] Page 7 of 8 -------------------------------------------------------------------------------- HxROS Details Patient Name: Date of Service: QUANTE, PETTRY DO RE E. 05/04/2022 2:15 PM Medical Record Number: [id number] Patient Account Number: [id number] Date of Birth/Sex: Treating RN: 06/14/1922 (89 y.o. M) Primary Care Provider: [name] Other Clinician: Referring Provider: Treating Provider/Extender: Sela Hua in Treatment: 6 Information Obtained From Patient Eyes Medical History: Past Medical History Notes: Macular degeneration in Left eye Cardiovascular Medical History: Positive for: Arrhythmia - Hx: RBBB; Paroxysmal atrial Fib.; Hypertension Past Medical History Notes: Hx: CASH with CABG 2000 with LIMA to LAD,SV graft. Hx: Hyperlipidemia Hx: stroke Gastrointestinal Medical History: Past Medical History Notes: GERD Genitourinary Medical History: Past Medical History Notes: Hx: BPH Immunizations Pneumococcal Vaccine: Received Pneumococcal Vaccination: Yes Received Pneumococcal Vaccination On or After 60th Birthday: Yes Implantable Devices None Hospitalization / Surgery History Type of Hospitalization/Surgery T without cardioversion ; Hernia repair;2000 cardiac Bypass ee Family and Social History Never smoker; Marital Status - Married; Alcohol Use: Rarely; Drug Use: No History; Caffeine Use: Daily; Financial Concerns: No; Food, Clothing  or Shelter Needs: No; Support System Lacking: No; Transportation Concerns: No Electronic Signature(s) Signed: 05/04/2022 4:15:16 PM By: Fredirick Maudlin MD FACS Entered By: Fredirick Maudlin on 05/04/2022 15:40:07 SuperBill Details -------------------------------------------------------------------------------- [name] ([id number]) [id number] Page 8 of 8 Patient Name: Date of Service: KHARY, SCHABEN DO RE E. 05/04/2022 Medical Record Number: [id number] Patient Account Number: [id number] Date of Birth/Sex: Treating RN: [birth year] (89 y.o. [name] Primary Care Provider: [name] Other Clinician: Referring Provider: Treating Provider/Extender: Sela Hua in Treatment: 6 Diagnosis Coding ICD-10 Codes Code Description [phone] Non-pressure chronic ulcer of other part of right lower leg with fat layer exposed I10 Essential (primary) hypertension R60.0 Localized edema N18.31 Chronic kidney disease, stage 3a I50.30 Unspecified diastolic (congestive) heart failure Facility Procedures CPT4 Code Description Modifier Quantity [id number] - WOUND CARE VISIT-LEV 1 EST PT 25 1 [id number] 11042 - DEB SUBQ TISSUE 20 SQ CM/< 1 ICD-10 Diagnosis Description L97.812 Non-pressure chronic ulcer of other part of right lower leg with fat layer exposed Physician Procedures Quantity CPT4 Code Description Modifier [id number] - WC PHYS LEVEL 3 - EST PT 25 1 ICD-10 Diagnosis Description L97.812 Non-pressure chronic ulcer of other part of right lower leg with fat layer  exposed R60.0 Localized edema I50.30 Unspecified diastolic (congestive) heart failure N18.31 Chronic kidney disease, stage 3a [id number] - WC PHYS SUBQ TISS 20 SQ CM 1 ICD-10 Diagnosis Description L97.812 Non-pressure chronic ulcer of other part of right lower leg with fat layer exposed Electronic Signature(s) Signed: 05/04/2022 3:46:03 PM By: Fredirick Maudlin MD  FACS Previous Signature: 05/04/2022 3:28:26 PM Version By: Dellie Catholic RN Entered By: Fredirick Maudlin on 05/04/2022 15:46:03

## 2022-05-05 NOTE — Progress Notes (Signed)
[name] ([id number]) [phone].pdf Page 1 of 9 Visit Report for 05/04/2022 Arrival Information Details Patient Name: Date of Service: [name], DINO DO RE E. 05/04/2022 2:15 PM Medical Record Number: [id number] Patient Account Number: [id number] Date of Birth/Sex: Treating RN: [birth year] (89 y.o. M) Primary Care Dyami Umbach: [name] Other Clinician: Referring Neriah Brott: Treating Hopie Pellegrin/Extender: Sela Hua in Treatment: 6 Visit Information History Since Last Visit All ordered tests and consults were completed: No Patient Arrived: Ambulatory Added or deleted any medications: No Arrival Time: 14:15 Any new allergies or adverse reactions: No Accompanied By: self Had a fall or experienced change in No Transfer Assistance: None activities of daily living that may affect Patient Identification Verified: Yes risk of falls: Secondary Verification Process Completed: Yes Signs or symptoms of abuse/neglect since last visito No Patient Has Alerts: Yes Hospitalized since last visit: No Patient Alerts: Patient on Blood Thinner Implantable device outside of the clinic excluding No Eliquis cellular tissue based products placed in the center since last visit: Pain Present Now: No Electronic Signature(s) Signed: 05/04/2022 2:53:30 PM By: Worthy Rancher Entered By: Worthy Rancher on 05/04/2022 14:15:17 -------------------------------------------------------------------------------- Clinic Level of Care Assessment Details Patient Name: Date of Service: [name], [name] 05/04/2022 2:15 PM Medical Record Number: [id number] Patient Account Number: [id number] Date of Birth/Sex: Treating RN: [birth year] (89 y.o. [name] Primary Care Kory Rains: [name] Other Clinician: Referring Emaley Applin: Treating Kirstein Baxley/Extender: Sela Hua in Treatment: 6 Clinic Level of Care Assessment Items TOOL 4 Quantity Score '[]'$  -  0 Use when only an EandM is performed on FOLLOW-UP visit ASSESSMENTS - Nursing Assessment / Reassessment '[]'$  - 0 Reassessment of Co-morbidities (includes updates in patient status) '[]'$  - 0 Reassessment of Adherence to Treatment Plan ASSESSMENTS - Wound and Skin A ssessment / Reassessment '[]'$  - 0 Simple Wound Assessment / Reassessment - one wound '[]'$  - 0 Complex Wound Assessment / Reassessment - multiple wounds '[]'$  - 0 Dermatologic / Skin Assessment (not related to wound area) ASSESSMENTS - Focused Assessment '[]'$  - 0 Circumferential Edema Measurements - multi extremities '[]'$  - 0 Nutritional Assessment / Counseling / Intervention DALIN, CALDERA ([id number]) [id number] Page 2 of 9 '[]'$  - 0 Lower Extremity Assessment (monofilament, tuning fork, pulses) '[]'$  - 0 Peripheral Arterial Disease Assessment (using hand held doppler) ASSESSMENTS - Ostomy and/or Continence Assessment and Care '[]'$  - 0 Incontinence Assessment and Management '[]'$  - 0 Ostomy Care Assessment and Management (repouching, etc.) PROCESS - Coordination of Care '[]'$  - 0 Simple Patient / Family Education for ongoing care '[]'$  - 0 Complex (extensive) Patient / Family Education for ongoing care '[]'$  - 0 Staff obtains Programmer, systems, Records, T Results / Process Orders est '[]'$  - 0 Staff telephones HHA, Nursing Homes / Clarify orders / etc '[]'$  - 0 Routine Transfer to another Facility (non-emergent condition) '[]'$  - 0 Routine Hospital Admission (non-emergent condition) '[]'$  - 0 New Admissions / Biomedical engineer / Ordering NPWT Apligraf, etc. , '[]'$  - 0 Emergency Hospital Admission (emergent condition) '[]'$  - 0 Simple Discharge Coordination '[]'$  - 0 Complex (extensive) Discharge Coordination PROCESS - Special Needs '[]'$  - 0 Pediatric / Minor Patient Management '[]'$  - 0 Isolation Patient Management '[]'$  - 0 Hearing / Language / Visual special needs '[]'$  - 0 Assessment of Community assistance (transportation, D/C  planning, etc.) '[]'$  - 0 Additional assistance / Altered mentation '[]'$  - 0 Support Surface(s) Assessment (bed, cushion, seat, etc.) INTERVENTIONS - Wound Cleansing / Measurement '[]'$  - 0 Simple Wound Cleansing -  one wound '[]'$  - 0 Complex Wound Cleansing - multiple wounds '[]'$  - 0 Wound Imaging (photographs - any number of wounds) '[]'$  - 0 Wound Tracing (instead of photographs) '[]'$  - 0 Simple Wound Measurement - one wound '[]'$  - 0 Complex Wound Measurement - multiple wounds INTERVENTIONS - Wound Dressings '[]'$  - 0 Small Wound Dressing one or multiple wounds '[]'$  - 0 Medium Wound Dressing one or multiple wounds '[]'$  - 0 Large Wound Dressing one or multiple wounds '[]'$  - 0 Application of Medications - topical '[]'$  - 0 Application of Medications - injection INTERVENTIONS - Miscellaneous '[]'$  - 0 External ear exam '[]'$  - 0 Specimen Collection (cultures, biopsies, blood, body fluids, etc.) '[]'$  - 0 Specimen(s) / Culture(s) sent or taken to Lab for analysis '[]'$  - 0 Patient Transfer (multiple staff / Civil Service fast streamer / Similar devices) '[]'$  - 0 Simple Staple / Suture removal (25 or less) '[]'$  - 0 Complex Staple / Suture removal (26 or more) '[]'$  - 0 Hypo / Hyperglycemic Management (close monitor of Blood Glucose) Porche, Gennie E ([id number]) [id number].pdf Page 3 of 9 X- 1 15 Ankle / Brachial Index (ABI) - do not check if billed separately '[]'$  - 0 Vital Signs Has the patient been seen at the hospital within the last three years: Yes Total Score: 15 Level Of Care: New/Established - Level 1 Electronic Signature(s) Signed: 05/04/2022 3:28:26 PM By: Dellie Catholic RN Entered By: Dellie Catholic on 05/04/2022 15:23:17 -------------------------------------------------------------------------------- Encounter Discharge Information Details Patient Name: Date of Service: Gregg Cliche DO RE E. 05/04/2022 2:15 PM Medical Record Number: [id number] Patient Account Number: [id number] Date of Birth/Sex:  Treating RN: 12/03/1922 (89 y.o. [name] Primary Care Jibri Schriefer: [name] Other Clinician: Referring Josuha Fontanez: Treating Melora Menon/Extender: Sela Hua in Treatment: 6 Encounter Discharge Information Items Post Procedure Vitals Discharge Condition: Stable Temperature (F): 98.2 Ambulatory Status: Ambulatory Pulse (bpm): 58 Discharge Destination: Home Respiratory Rate (breaths/min): 20 Transportation: Private Auto Blood Pressure (mmHg): 180/63 Accompanied By: self Schedule Follow-up Appointment: Yes Clinical Summary of Care: Patient Declined Electronic Signature(s) Signed: 05/04/2022 3:28:26 PM By: Dellie Catholic RN Entered By: Dellie Catholic on 05/04/2022 15:20:40 -------------------------------------------------------------------------------- Lower Extremity Assessment Details Patient Name: Date of Service: [name], FAULK DO RE E. 05/04/2022 2:15 PM Medical Record Number: [id number] Patient Account Number: [id number] Date of Birth/Sex: Treating RN: 10-Apr-1923 (89 y.o. Ernestene Mention Primary Care Lyndi Holbein: [name] Other Clinician: Referring Aydyn Testerman: Treating Filiberto Wamble/Extender: Sela Hua in Treatment: 6 Edema Assessment Assessed: [Left: No] [Right: No] [Left: Edema] [Right: :] Calf Left: Right: Point of Measurement: From Medial Instep 38.5 cm Ankle Left: Right: Point of Measurement: From Medial Instep 23.5 cm Vascular Assessment Posner, Gunnison E ([id number]) [[id number] Page 4 of 9] Pulses: Dorsalis Pedis Palpable: [Right:Yes] Doppler Audible: [Right:Yes] Posterior Tibial Palpable: [Right:Yes] Doppler Audible: [Right:Yes] Blood Pressure: Brachial: [Right:180] Ankle: [Right:Dorsalis Pedis: 138 0.77] Electronic Signature(s) Signed: 05/04/2022 3:28:26 PM By: Dellie Catholic RN Signed: 05/05/2022 5:16:51 PM By: Baruch Gouty RN, BSN Entered By: Dellie Catholic on  05/04/2022 15:03:16 -------------------------------------------------------------------------------- Multi Wound Chart Details Patient Name: Date of Service: Gregg Cliche DO RE E. 05/04/2022 2:15 PM Medical Record Number: [id number] Patient Account Number: [id number] Date of Birth/Sex: Treating RN: [birth year] (89 y.o. M) Primary Care [name]: [name] Other Clinician: Referring Salem Mastrogiovanni: Treating Shenequa Howse/Extender: Sela Hua in Treatment: 6 Vital Signs Height(in): 71 Pulse(bpm): 58 Weight(lbs): 167 Blood Pressure(mmHg): 180/63 Body Mass Index(BMI): 23.3 Temperature(F): 98.2 Respiratory Rate(breaths/min): 20 [1:Photos:] [N/A:N/A] Right, Anterior Lower Leg N/A  N/A Wound Location: Trauma N/A N/A Wounding Event: Abrasion N/A N/A Primary Etiology: Arrhythmia, Hypertension N/A N/A Comorbid History: 03/01/2022 N/A N/A Date Acquired: 6 N/A N/A Weeks of Treatment: Open N/A N/A Wound Status: No N/A N/A Wound Recurrence: 1.4x0.6x0.1 N/A N/A Measurements L x W x D (cm) 0.66 N/A N/A A (cm) : rea 0.066 N/A N/A Volume (cm) : 93.00% N/A N/A % Reduction in A rea: 93.00% N/A N/A % Reduction in Volume: Full Thickness Without Exposed N/A N/A Classification: Support Structures Medium N/A N/A Exudate Amount: Serosanguineous N/A N/A Exudate Type: red, brown N/A N/A Exudate Color: Distinct, outline attached N/A N/A Wound Margin: Small (1-33%) N/A N/A Granulation Amount: Pink N/A N/A Granulation Quality: Large (67-100%) N/A N/A Necrotic Amount: Fat Layer (Subcutaneous Tissue): Yes N/A N/A Exposed Structures: Fascia: No Tendon: No JESAIAH, FABIANO E ([id number]) [id number] Page 5 of 9 Muscle: No Joint: No Bone: No Small (1-33%) N/A N/A Epithelialization: Debridement - Excisional N/A N/A Debridement: Pre-procedure Verification/Time Out 14:45 N/A N/A Taken: Lidocaine 5% topical ointment N/A N/A Pain  Control: Necrotic/Eschar, Subcutaneous, N/A N/A Tissue Debrided: Slough Skin/Subcutaneous Tissue N/A N/A Level: 0.84 N/A N/A Debridement A (sq cm): rea Curette N/A N/A Instrument: Minimum N/A N/A Bleeding: Pressure N/A N/A Hemostasis Achieved: 0 N/A N/A Procedural Pain: 0 N/A N/A Post Procedural Pain: Debridement Treatment Response: Procedure was tolerated well N/A N/A Post Debridement Measurements L x 1.4x0.6x0.1 N/A N/A W x D (cm) 0.066 N/A N/A Post Debridement Volume: (cm) Scarring: Yes N/A N/A Periwound Skin Texture: No Abnormalities Noted N/A N/A Periwound Skin Moisture: Hemosiderin Staining: Yes N/A N/A Periwound Skin Color: No Abnormality N/A N/A Temperature: Debridement N/A N/A Procedures Performed: Treatment Notes Wound #1 (Lower Leg) Wound Laterality: Right, Anterior Cleanser Peri-Wound Care Triamcinolone 15 (g) Discharge Instruction: Use triamcinolone 15 (g) as directed Sween Lotion (Moisturizing lotion) Discharge Instruction: Apply moisturizing lotion as directed Topical Primary Dressing Sorbalgon AG Dressing 2x2 (in/in) Discharge Instruction: Apply to wound bed as instructed Secondary Dressing Zetuvit Plus 4x8 in Discharge Instruction: Apply over primary dressing as directed. Secured With Compression Wrap Kerlix Roll 4.5x3.1 (in/yd) Discharge Instruction: Apply Kerlix and Coban compression as directed. Coban Self-Adherent Wrap 4x5 (in/yd) Discharge Instruction: Apply over Kerlix as directed. Compression Stockings Add-Ons Electronic Signature(s) Signed: 05/04/2022 3:39:08 PM By: Fredirick Maudlin MD FACS Entered By: Fredirick Maudlin on 05/04/2022 15:39:08 -------------------------------------------------------------------------------- Multi-Disciplinary Care Plan Details Patient Name: Date of Service: Gregg Cliche DO RE E. 05/04/2022 2:15 PM Medical Record Number: [id number] Patient Account Number: [id number] [name], [name] ([id number])  [phone].pdf Page 6 of 9 Date of Birth/Sex: Treating RN: 08-18-1922 (89 y.o. Ernestene Mention Primary Care Aryssa Rosamond: Other Clinician: [name] Referring Kori Goins: Treating Cardale Dorer/Extender: Sela Hua in Treatment: 6 Multidisciplinary Care Plan reviewed with physician Active Inactive Pain, Acute or Chronic Nursing Diagnoses: Pain, acute or chronic: actual or potential Goals: Patient/caregiver will verbalize adequate pain control between visits Date Initiated: 03/19/2022 Target Resolution Date: 07/11/2022 Goal Status: Active Interventions: Encourage patient to take pain medications as prescribed Notes: Venous Leg Ulcer Nursing Diagnoses: Knowledge deficit related to disease process and management Potential for venous Insuffiency (use before diagnosis confirmed) Goals: Patient will maintain optimal edema control Date Initiated: 05/04/2022 Target Resolution Date: 06/01/2022 Goal Status: Active Interventions: Assess peripheral edema status every visit. Compression as ordered Treatment Activities: Therapeutic compression applied : 05/04/2022 Notes: Electronic Signature(s) Signed: 05/05/2022 5:16:51 PM By: Baruch Gouty RN, BSN Entered By: Baruch Gouty on 05/04/2022 14:42:48 -------------------------------------------------------------------------------- Pain Assessment Details Patient Name: Date of  Service: [name], ENKE DO RE E. 05/04/2022 2:15 PM Medical Record Number: [id number] Patient Account Number: [id number] Date of Birth/Sex: Treating RN: 09/01/1922 (89 y.o. M) Primary Care Daryus Sowash: [name] Other Clinician: Referring Connie Hilgert: Treating Kandise Riehle/Extender: Sela Hua in Treatment: 6 Active Problems Location of Pain Severity and Description of Pain Patient Has Paino No Site Locations Rate the pain. WEI, [place] ([id number]) [phone].pdf Page 7 of 9 Rate  the pain. Current Pain Level: 0 Pain Management and Medication Current Pain Management: Electronic Signature(s) Signed: 05/05/2022 5:16:51 PM By: Baruch Gouty RN, BSN Entered By: Baruch Gouty on 05/04/2022 14:38:14 -------------------------------------------------------------------------------- Patient/Caregiver Education Details Patient Name: Date of Service: Gregg Cliche DO RE E. 1/23/2024andnbsp2:15 PM Medical Record Number: [id number] Patient Account Number: [id number] Date of Birth/Gender: Treating RN: February 24, 1923 (89 y.o. Ernestene Mention Primary Care Physician: [name] Other Clinician: Referring Physician: Treating Physician/Extender: Sela Hua in Treatment: 6 Education Assessment Education Provided To: Patient Education Topics Provided Venous: Methods: Explain/Verbal Responses: Reinforcements needed, State content correctly Electronic Signature(s) Signed: 05/05/2022 5:16:51 PM By: Baruch Gouty RN, BSN Entered By: Baruch Gouty on 05/04/2022 14:43:06 -------------------------------------------------------------------------------- Wound Assessment Details Patient Name: Date of Service: Gregg Cliche DO RE E. 05/04/2022 2:15 PM Medical Record Number: [id number] Patient Account Number: [id number] Date of Birth/Sex: Treating RN: Sep 17, 1922 (89 y.o. M) Primary Care Woodley Petzold: [name] Other Clinician: Referring Ariannie Penaloza: Treating Jack Bolio/Extender: Nahome, Bublitz ([id number]) [id number] Page 8 of 9 Weeks in Treatment: 6 Wound Status Wound Number: 1 Primary Etiology: Abrasion Wound Location: Right, Anterior Lower Leg Wound Status: Open Wounding Event: Trauma Comorbid History: Arrhythmia, Hypertension Date Acquired: 03/01/2022 Weeks Of Treatment: 6 Clustered Wound: No Photos Wound Measurements Length: (cm) 1.4 Width: (cm) 0.6 Depth: (cm) 0.1 Area: (cm) 0.66 Volume: (cm)  0.066 % Reduction in Area: 93% % Reduction in Volume: 93% Epithelialization: Small (1-33%) Tunneling: No Undermining: No Wound Description Classification: Full Thickness Without Exposed Suppor Wound Margin: Distinct, outline attached Exudate Amount: Medium Exudate Type: Serosanguineous Exudate Color: red, brown t Structures Foul Odor After Cleansing: No Slough/Fibrino Yes Wound Bed Granulation Amount: Small (1-33%) Exposed Structure Granulation Quality: Pink Fascia Exposed: No Necrotic Amount: Large (67-100%) Fat Layer (Subcutaneous Tissue) Exposed: Yes Necrotic Quality: Adherent Slough Tendon Exposed: No Muscle Exposed: No Joint Exposed: No Bone Exposed: No Periwound Skin Texture Texture Color No Abnormalities Noted: No No Abnormalities Noted: No Scarring: Yes Hemosiderin Staining: Yes Moisture Temperature / Pain No Abnormalities Noted: Yes Temperature: No Abnormality Treatment Notes Wound #1 (Lower Leg) Wound Laterality: Right, Anterior Cleanser Peri-Wound Care Triamcinolone 15 (g) Discharge Instruction: Use triamcinolone 15 (g) as directed Sween Lotion (Moisturizing lotion) Discharge Instruction: Apply moisturizing lotion as directed Topical Primary Dressing Sorbalgon AG Dressing 2x2 (in/in) Discharge Instruction: Apply to wound bed as instructed Secondary Dressing ELWARD, NOCERA ([id number]) [phone].pdf Page 9 of 9 Zetuvit Plus 4x8 in Discharge Instruction: Apply over primary dressing as directed. Secured With Compression Wrap Kerlix Roll 4.5x3.1 (in/yd) Discharge Instruction: Apply Kerlix and Coban compression as directed. Coban Self-Adherent Wrap 4x5 (in/yd) Discharge Instruction: Apply over Kerlix as directed. Compression Stockings Add-Ons Electronic Signature(s) Signed: 05/05/2022 5:16:51 PM By: Baruch Gouty RN, BSN Entered By: Baruch Gouty on 05/04/2022  14:41:11 -------------------------------------------------------------------------------- Vitals Details Patient Name: Date of Service: Gregg Cliche DO RE E. 05/04/2022 2:15 PM Medical Record Number: [id number] Patient Account Number: [id number] Date of Birth/Sex: Treating RN: 01-13-1923 (89 y.o. M) Primary Care Tyjon Bowen: [name] Other Clinician: Referring Verble Styron: Treating Noboru Bidinger/Extender: Fredirick Maudlin  [name] Weeks in Treatment: 6 Vital Signs Time Taken: 02:15 Temperature (F): 98.2 Height (in): 71 Pulse (bpm): 58 Weight (lbs): 167 Respiratory Rate (breaths/min): 20 Body Mass Index (BMI): 23.3 Blood Pressure (mmHg): 180/63 Reference Range: 80 - 120 mg / dl Electronic Signature(s) Signed: 05/04/2022 3:28:26 PM By: Dellie Catholic RN Previous Signature: 05/04/2022 2:53:30 PM Version By: Worthy Rancher Entered By: Dellie Catholic on 05/04/2022 14:53:37

## 2022-05-12 ENCOUNTER — Encounter (HOSPITAL_BASED_OUTPATIENT_CLINIC_OR_DEPARTMENT_OTHER): Payer: Medicare Other | Admitting: General Surgery

## 2022-05-12 DIAGNOSIS — I5030 Unspecified diastolic (congestive) heart failure: Secondary | ICD-10-CM | POA: Diagnosis not present

## 2022-05-12 DIAGNOSIS — S80811A Abrasion, right lower leg, initial encounter: Secondary | ICD-10-CM | POA: Diagnosis not present

## 2022-05-12 DIAGNOSIS — E785 Hyperlipidemia, unspecified: Secondary | ICD-10-CM | POA: Diagnosis not present

## 2022-05-12 DIAGNOSIS — K219 Gastro-esophageal reflux disease without esophagitis: Secondary | ICD-10-CM | POA: Diagnosis not present

## 2022-05-12 DIAGNOSIS — N400 Enlarged prostate without lower urinary tract symptoms: Secondary | ICD-10-CM | POA: Diagnosis not present

## 2022-05-12 DIAGNOSIS — I130 Hypertensive heart and chronic kidney disease with heart failure and stage 1 through stage 4 chronic kidney disease, or unspecified chronic kidney disease: Secondary | ICD-10-CM | POA: Diagnosis not present

## 2022-05-12 DIAGNOSIS — I480 Paroxysmal atrial fibrillation: Secondary | ICD-10-CM | POA: Diagnosis not present

## 2022-05-12 DIAGNOSIS — L97812 Non-pressure chronic ulcer of other part of right lower leg with fat layer exposed: Secondary | ICD-10-CM | POA: Diagnosis not present

## 2022-05-12 DIAGNOSIS — N1831 Chronic kidney disease, stage 3a: Secondary | ICD-10-CM | POA: Diagnosis not present

## 2022-05-12 DIAGNOSIS — I10 Essential (primary) hypertension: Secondary | ICD-10-CM | POA: Diagnosis not present

## 2022-05-12 NOTE — Progress Notes (Signed)
[name], [name] ([id number]) [id number] Page 1 of 7 Visit Report for 05/12/2022 Arrival Information Details Patient Name: Date of Service: [name], [name] 05/12/2022 2:15 PM Medical Record Number: [id number] Patient Account Number: [id number] Date of Birth/Sex: Treating RN: 05-28-1922 (89 y.o. [name] Primary Care Kay Shippy: [name] Other Clinician: Referring Ouida Abeyta: Treating Jarica Plass/Extender: Sela Hua in Treatment: 7 Visit Information History Since Last Visit Added or deleted any medications: No Patient Arrived: Ambulatory Any new allergies or adverse reactions: No Arrival Time: 14:22 Had a fall or experienced change in No Accompanied By: self activities of daily living that may affect Transfer Assistance: None risk of falls: Patient Identification Verified: Yes Signs or symptoms of abuse/neglect since last visito No Secondary Verification Process Completed: Yes Hospitalized since last visit: No Patient Has Alerts: Yes Implantable device outside of the clinic excluding No Patient Alerts: Patient on Blood Thinner cellular tissue based products placed in the center Eliquis since last visit: Has Dressing in Place as Prescribed: No Has Compression in Place as Prescribed: No Pain Present Now: No Electronic Signature(s) Signed: 05/12/2022 4:56:24 PM By: Blanche East RN Entered By: Blanche East on 05/12/2022 14:24:55 -------------------------------------------------------------------------------- Encounter Discharge Information Details Patient Name: Date of Service: [name] 05/12/2022 2:15 PM Medical Record Number: [id number] Patient Account Number: [id number] Date of Birth/Sex: Treating RN: 1922/08/26 (89 y.o. [name] Primary Care Kyarah Enamorado: [name] Other Clinician: Referring Channel Papandrea: Treating Shant Hence/Extender: Sela Hua in Treatment: 7 Encounter Discharge  Information Items Post Procedure Vitals Discharge Condition: Stable Temperature (F): 97.9 Ambulatory Status: Ambulatory Pulse (bpm): 55 Discharge Destination: Home Respiratory Rate (breaths/min): 18 Transportation: Private Auto Blood Pressure (mmHg): 170/69 Accompanied By: self Schedule Follow-up Appointment: No Clinical Summary of Care: Electronic Signature(s) Signed: 05/12/2022 4:56:24 PM By: Blanche East RN Entered By: Blanche East on 05/12/2022 14:35:46 Velez, [name] ([id number]) [id number] Page 2 of 7 -------------------------------------------------------------------------------- Lower Extremity Assessment Details Patient Name: Date of Service: [name] 05/12/2022 2:15 PM Medical Record Number: [id number] Patient Account Number: [id number] Date of Birth/Sex: Treating RN: [birth year] (89 y.o. [name] Primary Care Frank Novelo: [name] Other Clinician: Referring Julieth Tugman: Treating Fifi Schindler/Extender: Sela Hua in Treatment: 7 Edema Assessment Assessed: [Left: No] [Right: No] [Left: Edema] [Right: :] Calf Left: Right: Point of Measurement: From Medial Instep 37.5 cm Ankle Left: Right: Point of Measurement: From Medial Instep 23 cm Vascular Assessment Pulses: Dorsalis Pedis Palpable: [Right:Yes] Electronic Signature(s) Signed: 05/12/2022 4:56:24 PM By: Blanche East RN Entered By: Blanche East on 05/12/2022 14:25:45 -------------------------------------------------------------------------------- Multi Wound Chart Details Patient Name: Date of Service: [name] 05/12/2022 2:15 PM Medical Record Number: [id number] Patient Account Number: [id number] Date of Birth/Sex: Treating RN: 06-Dec-1922 (89 y.o. M) Primary Care Kadeidra Coryell: [name] Other Clinician: Referring Zaniyah Wernette: Treating Jossalin Chervenak/Extender: Sela Hua in Treatment: 7 Vital Signs Height(in):  71 Pulse(bpm): 55 Weight(lbs): 167 Blood Pressure(mmHg): 170/69 Body Mass Index(BMI): 23.3 Temperature(F): 97.9 Respiratory Rate(breaths/min): 18 [1:Photos:] [N/A:N/A [id number] Page 3 of 7] Right, Anterior Lower Leg N/A N/A Wound Location: Trauma N/A N/A Wounding Event: Abrasion N/A N/A Primary Etiology: Arrhythmia, Hypertension N/A N/A Comorbid History: 03/01/2022 N/A N/A Date Acquired: 7 N/A N/A Weeks of Treatment: Open N/A N/A Wound Status: No N/A N/A Wound Recurrence: 1.4x0.6x0.1 N/A N/A Measurements L x W x D (cm) 0.66 N/A N/A A (cm) : rea 0.066 N/A N/A Volume (cm) : 93.00% N/A N/A % Reduction in  A rea: 93.00% N/A N/A % Reduction in Volume: Full Thickness Without Exposed N/A N/A Classification: Support Structures Medium N/A N/A Exudate A mount: Serosanguineous N/A N/A Exudate Type: red, brown N/A N/A Exudate Color: Distinct, outline attached N/A N/A Wound Margin: Small (1-33%) N/A N/A Granulation A mount: Pink N/A N/A Granulation Quality: Large (67-100%) N/A N/A Necrotic A mount: Fat Layer (Subcutaneous Tissue): Yes N/A N/A Exposed Structures: Fascia: No Tendon: No Muscle: No Joint: No Bone: No Small (1-33%) N/A N/A Epithelialization: Debridement - Excisional N/A N/A Debridement: Pre-procedure Verification/Time Out 14:32 N/A N/A Taken: Lidocaine 5% topical ointment N/A N/A Pain Control: Necrotic/Eschar, Subcutaneous, N/A N/A Tissue Debrided: Slough Skin/Subcutaneous Tissue N/A N/A Level: 0.84 N/A N/A Debridement A (sq cm): rea Curette N/A N/A Instrument: Minimum N/A N/A Bleeding: Pressure N/A N/A Hemostasis Achieved: Debridement Treatment Response: Procedure was tolerated well N/A N/A Post Debridement Measurements L x 1.4x0.6x0.1 N/A N/A W x D (cm) 0.066 N/A N/A Post Debridement Volume: (cm) Scarring: Yes N/A N/A Periwound Skin Texture: No Abnormalities Noted N/A N/A Periwound Skin  Moisture: Hemosiderin Staining: Yes N/A N/A Periwound Skin Color: No Abnormality N/A N/A Temperature: Debridement N/A N/A Procedures Performed: Treatment Notes Wound #1 (Lower Leg) Wound Laterality: Right, Anterior Cleanser Peri-Wound Care Triamcinolone 15 (g) Discharge Instruction: Use triamcinolone 15 (g) as directed Sween Lotion (Moisturizing lotion) Discharge Instruction: Apply moisturizing lotion as directed Topical Primary Dressing Sorbalgon AG Dressing 2x2 (in/in) Discharge Instruction: Apply to wound bed as instructed Secondary Dressing Zetuvit Plus 4x8 in Discharge Instruction: Apply over primary dressing as directed. Secured With Compression Wrap Kerlix Roll 4.5x3.1 (in/yd) Discharge Instruction: Apply Kerlix and Coban compression as directed. Coban Self-Adherent Wrap 4x5 (in/yd) Discharge Instruction: Apply over Kerlix as directed. [name], [name] ([id number]) [id number] Page 4 of 7 Compression Stockings Add-Ons Electronic Signature(s) Signed: 05/12/2022 2:48:10 PM By: Fredirick Maudlin MD FACS Entered By: Fredirick Maudlin on 05/12/2022 14:48:10 -------------------------------------------------------------------------------- Multi-Disciplinary Care Plan Details Patient Name: Date of Service: [name] 05/12/2022 2:15 PM Medical Record Number: [id number] Patient Account Number: [id number] Date of Birth/Sex: Treating RN: 27-Nov-1922 (89 y.o. [name] Primary Care Jamas Jaquay: [name] Other Clinician: Referring Nidya Bouyer: Treating Zacary Bauer/Extender: Sela Hua in Treatment: 7 Multidisciplinary Care Plan reviewed with physician Active Inactive Pain, Acute or Chronic Nursing Diagnoses: Pain, acute or chronic: actual or potential Goals: Patient/caregiver will verbalize adequate pain control between visits Date Initiated: 03/19/2022 Target Resolution Date: 07/11/2022 Goal Status:  Active Interventions: Encourage patient to take pain medications as prescribed Notes: Venous Leg Ulcer Nursing Diagnoses: Knowledge deficit related to disease process and management Potential for venous Insuffiency (use before diagnosis confirmed) Goals: Patient will maintain optimal edema control Date Initiated: 05/04/2022 Target Resolution Date: 06/01/2022 Goal Status: Active Interventions: Assess peripheral edema status every visit. Compression as ordered Treatment Activities: Therapeutic compression applied : 05/04/2022 Notes: Electronic Signature(s) Signed: 05/12/2022 4:56:24 PM By: Blanche East RN Entered By: Blanche East on 05/12/2022 14:28:02 [name] ([id number]) [id number] Page 5 of 7 -------------------------------------------------------------------------------- Pain Assessment Details Patient Name: Date of Service: [name], ERBES DO RE [name] 05/12/2022 2:15 PM Medical Record Number: [id number] Patient Account Number: [id number] Date of Birth/Sex: Treating RN: [birth year] (89 y.o. [name] Primary Care Christipher Rieger: [name] Other Clinician: Referring Tomie Spizzirri: Treating Davyn Elsasser/Extender: Sela Hua in Treatment: 7 Active Problems Location of Pain Severity and Description of Pain Patient Has Paino No Site Locations Rate the pain. Current Pain Level: 0 Pain Management and Medication Current Pain Management: Electronic  Signature(s) Signed: 05/12/2022 4:56:24 PM By: Blanche East RN Entered By: Blanche East on 05/12/2022 14:25:18 -------------------------------------------------------------------------------- Patient/Caregiver Education Details Patient Name: Date of Service: [name] 1/31/2024andnbsp2:15 PM Medical Record Number: [id number] Patient Account Number: [id number] Date of Birth/Gender: Treating RN: 06-05-1922 (89 y.o. [name] Primary Care Physician: [name] Other  Clinician: Referring Physician: Treating Physician/Extender: Sela Hua in Treatment: 7 Education Assessment Education Provided To: Patient Education Topics Provided Wound Debridement: Methods: Explain/Verbal Responses: Reinforcements needed, State content correctly ABSHIR, [name] ([id number]) [phone].pdf Page 6 of 7 Wound/Skin Impairment: Methods: Explain/Verbal Responses: Reinforcements needed, State content correctly Electronic Signature(s) Signed: 05/12/2022 4:56:24 PM By: Blanche East RN Entered By: Blanche East on 05/12/2022 14:28:18 -------------------------------------------------------------------------------- Wound Assessment Details Patient Name: Date of Service: [name] 05/12/2022 2:15 PM Medical Record Number: [id number] Patient Account Number: [id number] Date of Birth/Sex: Treating RN: 31-May-1922 (89 y.o. [name] Primary Care Ngoc Daughtridge: [name] Other Clinician: Referring Marilyn Nihiser: Treating Areeb Corron/Extender: Sela Hua in Treatment: 7 Wound Status Wound Number: 1 Primary Etiology: Abrasion Wound Location: Right, Anterior Lower Leg Wound Status: Open Wounding Event: Trauma Comorbid History: Arrhythmia, Hypertension Date Acquired: 03/01/2022 Weeks Of Treatment: 7 Clustered Wound: No Photos Wound Measurements Length: (cm) 1.4 Width: (cm) 0.6 Depth: (cm) 0.1 Area: (cm) 0.66 Volume: (cm) 0.066 % Reduction in Area: 93% % Reduction in Volume: 93% Epithelialization: Small (1-33%) Tunneling: No Undermining: No Wound Description Classification: Full Thickness Without Exposed Suppor Wound Margin: Distinct, outline attached Exudate Amount: Medium Exudate Type: Serosanguineous Exudate Color: red, brown t Structures Foul Odor After Cleansing: No Slough/Fibrino Yes Wound Bed Granulation Amount: Small (1-33%) Exposed Structure Granulation Quality:  Pink Fascia Exposed: No Necrotic Amount: Large (67-100%) Fat Layer (Subcutaneous Tissue) Exposed: Yes Necrotic Quality: Adherent Slough Tendon Exposed: No Muscle Exposed: No Joint Exposed: No Bone Exposed: No Periwound Skin Texture Texture Color [name], [name] ([id number]) [phone].pdf Page 7 of 7 No Abnormalities Noted: No No Abnormalities Noted: No Scarring: Yes Hemosiderin Staining: Yes Moisture Temperature / Pain No Abnormalities Noted: Yes Temperature: No Abnormality Treatment Notes Wound #1 (Lower Leg) Wound Laterality: Right, Anterior Cleanser Peri-Wound Care Triamcinolone 15 (g) Discharge Instruction: Use triamcinolone 15 (g) as directed Sween Lotion (Moisturizing lotion) Discharge Instruction: Apply moisturizing lotion as directed Topical Primary Dressing Sorbalgon AG Dressing 2x2 (in/in) Discharge Instruction: Apply to wound bed as instructed Secondary Dressing Zetuvit Plus 4x8 in Discharge Instruction: Apply over primary dressing as directed. Secured With Compression Wrap Kerlix Roll 4.5x3.1 (in/yd) Discharge Instruction: Apply Kerlix and Coban compression as directed. Coban Self-Adherent Wrap 4x5 (in/yd) Discharge Instruction: Apply over Kerlix as directed. Compression Stockings Add-Ons Electronic Signature(s) Signed: 05/12/2022 4:56:24 PM By: Blanche East RN Entered By: Blanche East on 05/12/2022 14:27:17 -------------------------------------------------------------------------------- Vitals Details Patient Name: Date of Service: [name] 05/12/2022 2:15 PM Medical Record Number: [id number] Patient Account Number: [id number] Date of Birth/Sex: Treating RN: 1922/06/25 (89 y.o. [name] Primary Care Neelam Tiggs: [name] Other Clinician: Referring Colbey Wirtanen: Treating Azana Kiesler/Extender: Sela Hua in Treatment: 7 Vital Signs Time Taken: 14:24 Temperature (F): 97.9 Height (in):  71 Pulse (bpm): 55 Weight (lbs): 167 Respiratory Rate (breaths/min): 18 Body Mass Index (BMI): 23.3 Blood Pressure (mmHg): 170/69 Reference Range: 80 - 120 mg / dl Electronic Signature(s) Signed: 05/12/2022 4:56:24 PM By: Blanche East RN Entered By: Blanche East on 05/12/2022 14:25:11

## 2022-05-12 NOTE — Progress Notes (Signed)
[name], [name] ([id number]) [id number] Page 1 of 8 Visit Report for 05/12/2022 Chief Complaint Document Details Patient Name: Date of Service: FREEDOM, PEDDY DO RE [name] 05/12/2022 2:15 PM Medical Record Number: [id number] Patient Account Number: [id number] Date of Birth/Sex: Treating RN: 1923-03-29 (89 y.o. M) Primary Care Provider: [name] Other Clinician: Referring Provider: Treating Provider/Extender: Sela Hua in Treatment: 7 Information Obtained from: Patient Chief Complaint Patient seen for complaints of Non-Healing Wound. Electronic Signature(s) Signed: 05/12/2022 2:49:37 PM By: Fredirick Maudlin MD FACS Entered By: Fredirick Maudlin on 05/12/2022 14:49:37 -------------------------------------------------------------------------------- Debridement Details Patient Name: Date of Service: Gregg Cliche DO RE [name] 05/12/2022 2:15 PM Medical Record Number: [id number] Patient Account Number: [id number] Date of Birth/Sex: Treating RN: Dec 09, 1922 (89 y.o. Waldron Session Primary Care Provider: [name] Other Clinician: Referring Provider: Treating Provider/Extender: Sela Hua in Treatment: 7 Debridement Performed for Assessment: Wound #1 Right,Anterior Lower Leg Performed By: Physician Fredirick Maudlin, MD Debridement Type: Debridement Level of Consciousness (Pre-procedure): Awake and Alert Pre-procedure Verification/Time Out Yes - 14:32 Taken: Start Time: 14:33 Pain Control: Lidocaine 5% topical ointment T Area Debrided (L x W): otal 1.4 (cm) x 0.6 (cm) = 0.84 (cm) Tissue and other material debrided: Viable, Non-Viable, Eschar, Slough, Subcutaneous, Slough Level: Skin/Subcutaneous Tissue Debridement Description: Excisional Instrument: Curette Bleeding: Minimum Hemostasis Achieved: Pressure Response to Treatment: Procedure was tolerated well Level of Consciousness (Post- Awake and  Alert procedure): Post Debridement Measurements of Total Wound Length: (cm) 1.4 Width: (cm) 0.6 Depth: (cm) 0.1 Volume: (cm) 0.066 Character of Wound/Ulcer Post Debridement: Requires Further Debridement Post Procedure Diagnosis Same as Pre-procedure [name] ([id number]) [id number] Page 2 of 8 Notes Scribed for Dr. Celine Ahr by Blanche East, RN Electronic Signature(s) Signed: 05/12/2022 2:55:26 PM By: Fredirick Maudlin MD FACS Signed: 05/12/2022 4:56:24 PM By: Blanche East RN Entered By: Blanche East on 05/12/2022 14:36:00 -------------------------------------------------------------------------------- HPI Details Patient Name: Date of Service: Gregg Cliche DO RE [name] 05/12/2022 2:15 PM Medical Record Number: [id number] Patient Account Number: [id number] Date of Birth/Sex: Treating RN: [birth year] (89 y.o. M) Primary Care Provider: [name] Other Clinician: Referring Provider: Treating Provider/Extender: Sela Hua in Treatment: 7 History of Present Illness HPI Description: ADMISSION 03/19/2022 This is a 89 year old man with a past medical history significant for coronary artery bypass with saphenous vein harvest from the right leg resulting in chronic edema, hypertension, stroke, paroxysmal atrial fibrillation on chronic anticoagulation, CHF, and stage III chronic kidney disease.. Near the beginning of November, he struck his leg on a step while getting into a bus. He developed a wound and went to urgent care but it did not seem to need any repair. It stayed open and he developed some discomfort and redness. There was some serous drainage and he presented to the emergency room on November 13. He was prescribed doxycycline and given bacitracin due to concern for infection. His PCP saw him on November 21 and recommended moist gauze dressing changes with Xeroform. He is accompanied today by his significant other who says she has  been dressing it with various topical agents including the mupirocin that was prescribed in the emergency room as well as Vaseline and a T pad. He was unable to tolerate ABIs today. elfa On his right anterior tibial surface, there is a wound that shows evidence of prior healing. There is a yellow dry eschar over the entire surface. Underneath this, there is some nonviable subcutaneous tissue and slough. He has 1+ nonpitting edema in  the leg. 12/15; second visit for this 89 year old man who lives in the friend's home [place] [facility] complex. His wound came from trauma after a almost fall getting on the activity bus.. Last week we used Iodoflex with 3 layer compression. He complains about pain in his right leg at night only which is relieved by either dangling his leg over the side of the bed or standing 04/13/2022: The wound is substantially smaller and shallower from when I saw it last. There is crusted Iodoflex along with rubbery slough on the wound surface. 04/20/2022: The wound is smaller and cleaner today. Still with a fair amount of rubbery slough, however. 04/27/2022: The wound is smaller and shallower today. Much less slough accumulation. 05/04/2022: The wound continues to contract. It is fairly clean with just a little slough and eschar accumulation. Edema control is inadequate. 05/12/2022: The wound is a little bit narrower. There is slough and eschar buildup. Good granulation tissue is filling in at the base. Electronic Signature(s) Signed: 05/12/2022 2:50:13 PM By: Fredirick Maudlin MD FACS Entered By: Fredirick Maudlin on 05/12/2022 14:50:13 -------------------------------------------------------------------------------- Physical Exam Details Patient Name: Date of Service: Gregg Cliche DO RE [name] 05/12/2022 2:15 PM Medical Record Number: [id number] Patient Account Number: [id number] Date of Birth/Sex: Treating RN: 07-22-1922 (89 y.o. M) Primary Care Provider: [name] Other Clinician: Referring  Provider: Treating Provider/Extender: Sela Hua in Treatment: [address] [name], Velez ([id number]) [id number] Page 3 of 8 Hypertensive, asymptomatic. Bradycardic, asymptomatic. . . no acute distress. Respiratory Normal work of breathing on room air. Notes 05/12/2022: The wound is a little bit narrower. There is slough and eschar buildup. Good granulation tissue is filling in at the base. Electronic Signature(s) Signed: 05/12/2022 2:51:08 PM By: Fredirick Maudlin MD FACS Entered By: Fredirick Maudlin on 05/12/2022 14:51:08 -------------------------------------------------------------------------------- Physician Orders Details Patient Name: Date of Service: Gregg Cliche DO RE [name] 05/12/2022 2:15 PM Medical Record Number: [id number] Patient Account Number: [id number] Date of Birth/Sex: Treating RN: 11-13-1922 (89 y.o. Waldron Session Primary Care Provider: [name] Other Clinician: Referring Provider: Treating Provider/Extender: Sela Hua in Treatment: 7 Verbal / Phone Orders: No Diagnosis Coding ICD-10 Coding Code Description [phone] Non-pressure chronic ulcer of other part of right lower leg with fat layer exposed I10 Essential (primary) hypertension R60.0 Localized edema N18.31 Chronic kidney disease, stage 3a I50.30 Unspecified diastolic (congestive) heart failure Follow-up Appointments ppointment in 1 week. - Dr. Celine Ahr - Room 3 Return A Anesthetic (In clinic) Topical Lidocaine 5% applied to wound bed - Used in Clinic Bathing/ Shower/ Hygiene May shower with protection but do not get wound dressing(s) wet. Protect dressing(s) with water repellant cover (for example, large plastic bag) or a cast cover and may then take shower. Edema Control - Lymphedema / SCD / Other void standing for long periods of time. - Elevate the legs throughout the day. A Wound Treatment Wound #1 - Lower Leg  Wound Laterality: Right, Anterior Peri-Wound Care: Triamcinolone 15 (g) 1 x Per Week/30 Days Discharge Instructions: Use triamcinolone 15 (g) as directed Peri-Wound Care: Sween Lotion (Moisturizing lotion) 1 x Per Week/30 Days Discharge Instructions: Apply moisturizing lotion as directed Prim Dressing: Promogran Prisma Matrix, 4.34 (sq in) (silver collagen) 1 x Per Week/30 Days ary Discharge Instructions: Moisten collagen with saline or hydrogel Secondary Dressing: Zetuvit Plus 4x8 in 1 x Per Week/30 Days Discharge Instructions: Apply over primary dressing as directed. Compression Wrap: Kerlix Roll 4.5x3.1 (in/yd) 1 x Per Week/30 Days Discharge Instructions: Apply Kerlix  and Coban compression as directed. Compression Wrap: Coban Self-Adherent Wrap 4x5 (in/yd) 1 x Per Week/30 Days Discharge Instructions: Apply over Kerlix as directed. [name], [name] ([id number]) [id number] Page 4 of 8 Electronic Signature(s) Signed: 05/12/2022 2:55:26 PM By: Fredirick Maudlin MD FACS Entered By: Fredirick Maudlin on 05/12/2022 14:51:32 -------------------------------------------------------------------------------- Problem List Details Patient Name: Date of Service: Gregg Cliche DO RE [name] 05/12/2022 2:15 PM Medical Record Number: [id number] Patient Account Number: [id number] Date of Birth/Sex: Treating RN: 1923/04/04 (89 y.o. M) Primary Care Provider: [name] Other Clinician: Referring Provider: Treating Provider/Extender: Sela Hua in Treatment: 7 Active Problems ICD-10 Encounter Code Description Active Date MDM Diagnosis [phone] Non-pressure chronic ulcer of other part of right lower leg with fat layer 03/19/2022 No Yes exposed Malone (primary) hypertension 03/19/2022 No Yes R60.0 Localized edema 03/19/2022 No Yes N18.31 Chronic kidney disease, stage 3a 03/19/2022 No Yes I50.30 Unspecified diastolic (congestive) heart failure 03/19/2022 No  Yes Inactive Problems Resolved Problems Electronic Signature(s) Signed: 05/12/2022 2:48:05 PM By: Fredirick Maudlin MD FACS Entered By: Fredirick Maudlin on 05/12/2022 14:48:04 -------------------------------------------------------------------------------- Progress Note Details Patient Name: Date of Service: Gregg Cliche DO RE [name] 05/12/2022 2:15 PM Medical Record Number: [id number] Patient Account Number: [id number] Date of Birth/Sex: Treating RN: 06-Feb-1923 (89 y.o. M) Primary Care Provider: [name] Other Clinician: Referring Provider: Treating Provider/Extender: Sela Hua in Treatment: [address], [place] ([id number]) [id number] Page 5 of 8 Subjective Chief Complaint Information obtained from Patient Patient seen for complaints of Non-Healing Wound. History of Present Illness (HPI) ADMISSION 03/19/2022 This is a 89 year old man with a past medical history significant for coronary artery bypass with saphenous vein harvest from the right leg resulting in chronic edema, hypertension, stroke, paroxysmal atrial fibrillation on chronic anticoagulation, CHF, and stage III chronic kidney disease.. Near the beginning of November, he struck his leg on a step while getting into a bus. He developed a wound and went to urgent care but it did not seem to need any repair. It stayed open and he developed some discomfort and redness. There was some serous drainage and he presented to the emergency room on November 13. He was prescribed doxycycline and given bacitracin due to concern for infection. His PCP saw him on November 21 and recommended moist gauze dressing changes with Xeroform. He is accompanied today by his significant other who says she has been dressing it with various topical agents including the mupirocin that was prescribed in the emergency room as well as Vaseline and a T pad. He was unable to tolerate ABIs today. elfa On his right  anterior tibial surface, there is a wound that shows evidence of prior healing. There is a yellow dry eschar over the entire surface. Underneath this, there is some nonviable subcutaneous tissue and slough. He has 1+ nonpitting edema in the leg. 12/15; second visit for this 36 year old man who lives in the friend's home [place] retirement complex. His wound came from trauma after a almost fall getting on the activity bus.. Last week we used Iodoflex with 3 layer compression. He complains about pain in his right leg at night only which is relieved by either dangling his leg over the side of the bed or standing 04/13/2022: The wound is substantially smaller and shallower from when I saw it last. There is crusted Iodoflex along with rubbery slough on the wound surface. 04/20/2022: The wound is smaller and cleaner today. Still with a fair amount of rubbery slough, however. 04/27/2022: The wound  is smaller and shallower today. Much less slough accumulation. 05/04/2022: The wound continues to contract. It is fairly clean with just a little slough and eschar accumulation. Edema control is inadequate. 05/12/2022: The wound is a little bit narrower. There is slough and eschar buildup. Good granulation tissue is filling in at the base. Patient History Information obtained from Patient. Social History Never smoker, Marital Status - Married, Alcohol Use - Rarely, Drug Use - No History, Caffeine Use - Daily. Medical History Cardiovascular Patient has history of Arrhythmia - Hx: RBBB; Paroxysmal atrial Fib., Hypertension Hospitalization/Surgery History - T without cardioversion ; Hernia repair;2000 cardiac Bypass. ee Medical A Surgical History Notes nd Eyes Macular degeneration in Left eye Cardiovascular Hx: CASH with CABG 2000 with LIMA to LAD,SV graft. Hx: Hyperlipidemia Hx: stroke Gastrointestinal GERD Genitourinary Hx: BPH Objective Constitutional Hypertensive, asymptomatic. Bradycardic, asymptomatic. no  acute distress. Vitals Time Taken: 2:24 PM, Height: 71 in, Weight: 167 lbs, BMI: 23.3, Temperature: 97.9 F, Pulse: 55 bpm, Respiratory Rate: 18 breaths/min, Blood Pressure: 170/69 mmHg. Respiratory Normal work of breathing on room air. General Notes: 05/12/2022: The wound is a little bit narrower. There is slough and eschar buildup. Good granulation tissue is filling in at the base. Integumentary (Hair, Skin) Wound #1 status is Open. Original cause of wound was Trauma. The date acquired was: 03/01/2022. The wound has been in treatment 7 weeks. The wound is located on the Right,Anterior Lower Leg. The wound measures 1.4cm length x 0.6cm width x 0.1cm depth; 0.66cm^2 area and 0.066cm^3 volume. There is Fat Layer (Subcutaneous Tissue) exposed. There is no tunneling or undermining noted. There is a medium amount of serosanguineous drainage noted. The wound margin is distinct with the outline attached to the wound base. There is small (1-33%) pink granulation within the wound bed. There is a large (67-100%) amount of necrotic tissue within the wound bed including Adherent Slough. The periwound skin appearance had no abnormalities noted for moisture. The periwound skin appearance exhibited: Scarring, Hemosiderin Staining. Periwound temperature was noted as No Abnormality. [name], [name] ([id number]) [id number] Page 6 of 8 Assessment Active Problems ICD-10 Non-pressure chronic ulcer of other part of right lower leg with fat layer exposed Essential (primary) hypertension Localized edema Chronic kidney disease, stage 3a Unspecified diastolic (congestive) heart failure Procedures Wound #1 Pre-procedure diagnosis of Wound #1 is an Abrasion located on the Right,Anterior Lower Leg . There was a Excisional Skin/Subcutaneous Tissue Debridement with a total area of 0.84 sq cm performed by Fredirick Maudlin, MD. With the following instrument(s): Curette to remove Viable and  Non-Viable tissue/material. Material removed includes Eschar, Subcutaneous Tissue, and Slough after achieving pain control using Lidocaine 5% topical ointment. No specimens were taken. A time out was conducted at 14:32, prior to the start of the procedure. A Minimum amount of bleeding was controlled with Pressure. The procedure was tolerated well. Post Debridement Measurements: 1.4cm length x 0.6cm width x 0.1cm depth; 0.066cm^3 volume. Character of Wound/Ulcer Post Debridement requires further debridement. Post procedure Diagnosis Wound #1: Same as Pre-Procedure General Notes: Scribed for Dr. Celine Ahr by Blanche East, RN. Plan Follow-up Appointments: Return Appointment in 1 week. - Dr. Celine Ahr - Room 3 Anesthetic: (In clinic) Topical Lidocaine 5% applied to wound bed - Used in Clinic Bathing/ Shower/ Hygiene: May shower with protection but do not get wound dressing(s) wet. Protect dressing(s) with water repellant cover (for example, large plastic bag) or a cast cover and may then take shower. Edema Control - Lymphedema / SCD / Other: Avoid  standing for long periods of time. - Elevate the legs throughout the day. WOUND #1: - Lower Leg Wound Laterality: Right, Anterior Peri-Wound Care: Triamcinolone 15 (g) 1 x Per Week/30 Days Discharge Instructions: Use triamcinolone 15 (g) as directed Peri-Wound Care: Sween Lotion (Moisturizing lotion) 1 x Per Week/30 Days Discharge Instructions: Apply moisturizing lotion as directed Prim Dressing: Promogran Prisma Matrix, 4.34 (sq in) (silver collagen) 1 x Per Week/30 Days ary Discharge Instructions: Moisten collagen with saline or hydrogel Secondary Dressing: Zetuvit Plus 4x8 in 1 x Per Week/30 Days Discharge Instructions: Apply over primary dressing as directed. Com pression Wrap: Kerlix Roll 4.5x3.1 (in/yd) 1 x Per Week/30 Days Discharge Instructions: Apply Kerlix and Coban compression as directed. Com pression Wrap: Coban Self-Adherent Wrap 4x5  (in/yd) 1 x Per Week/30 Days Discharge Instructions: Apply over Kerlix as directed. 05/12/2022: The wound is a little bit narrower. There is slough and eschar buildup. Good granulation tissue is filling in at the base. I used a curette to debride slough and eschar and some nonviable subcutaneous tissue from the wound. I am going to change his contact layer to Prisma silver collagen to try and encourage ingrowth of granulation tissue and epithelium. Unfortunately, due to his diminished ABI, I cannot apply a greater degree of compression so we will continue with Kerlix and Coban. Follow-up in 1 week. Electronic Signature(s) Signed: 05/12/2022 2:53:04 PM By: Fredirick Maudlin MD FACS Previous Signature: 05/12/2022 2:52:22 PM Version By: Fredirick Maudlin MD FACS Entered By: Fredirick Maudlin on 05/12/2022 14:53:04 Kozinski, [name] ([id number]) [id number] Page 7 of 8 -------------------------------------------------------------------------------- HxROS Details Patient Name: Date of Service: [name], CONWELL DO RE [name] 05/12/2022 2:15 PM Medical Record Number: [id number] Patient Account Number: [id number] Date of Birth/Sex: Treating RN: 06-Oct-1922 (89 y.o. M) Primary Care Provider: [name] Other Clinician: Referring Provider: Treating Provider/Extender: Sela Hua in Treatment: 7 Information Obtained From Patient Eyes Medical History: Past Medical History Notes: Macular degeneration in Left eye Cardiovascular Medical History: Positive for: Arrhythmia - Hx: RBBB; Paroxysmal atrial Fib.; Hypertension Past Medical History Notes: Hx: CASH with CABG 2000 with LIMA to LAD,SV graft. Hx: Hyperlipidemia Hx: stroke Gastrointestinal Medical History: Past Medical History Notes: GERD Genitourinary Medical History: Past Medical History Notes: Hx: BPH Immunizations Pneumococcal Vaccine: Received Pneumococcal Vaccination: Yes Received Pneumococcal  Vaccination On or After 60th Birthday: Yes Implantable Devices None Hospitalization / Surgery History Type of Hospitalization/Surgery T without cardioversion ; Hernia repair;2000 cardiac Bypass ee Family and Social History Never smoker; Marital Status - Married; Alcohol Use: Rarely; Drug Use: No History; Caffeine Use: Daily; Financial Concerns: No; Food, Clothing or Shelter Needs: No; Support System Lacking: No; Transportation Concerns: No Electronic Signature(s) Signed: 05/12/2022 2:55:26 PM By: Fredirick Maudlin MD FACS Entered By: Fredirick Maudlin on 05/12/2022 14:50:41 SuperBill Details -------------------------------------------------------------------------------- [name] ([id number]) [id number] Page 8 of 8 Patient Name: Date of Service: [name], THOMPSON DO RE [name] 05/12/2022 Medical Record Number: [id number] Patient Account Number: [id number] Date of Birth/Sex: Treating RN: 03/20/1923 (89 y.o. M) Primary Care Provider: [name] Other Clinician: Referring Provider: Treating Provider/Extender: Sela Hua in Treatment: 7 Diagnosis Coding ICD-10 Codes Code Description [phone] Non-pressure chronic ulcer of other part of right lower leg with fat layer exposed I10 Essential (primary) hypertension R60.0 Localized edema N18.31 Chronic kidney disease, stage 3a I50.30 Unspecified diastolic (congestive) heart failure Facility Procedures CPT4 Code Description Modifier Quantity [id number] 11042 - DEB SUBQ TISSUE 20 SQ CM/< 1 ICD-10 Diagnosis Description L97.812 Non-pressure chronic ulcer of other  part of right lower leg with fat layer exposed Physician Procedures Quantity CPT4 Code Description Modifier [id number] 10175 - WC PHYS LEVEL 3 - EST PT 25 1 ICD-10 Diagnosis Description L97.812 Non-pressure chronic ulcer of other part of right lower leg with fat layer exposed R60.0 Localized edema I50.30 Unspecified diastolic (congestive)  heart failure N18.31 Chronic kidney disease, stage 3a [id number] 11042 - WC PHYS SUBQ TISS 20 SQ CM 1 ICD-10 Diagnosis Description L97.812 Non-pressure chronic ulcer of other part of right lower leg with fat layer exposed Electronic Signature(s) Signed: 05/12/2022 2:53:20 PM By: Fredirick Maudlin MD FACS Entered By: Fredirick Maudlin on 05/12/2022 14:53:20

## 2022-05-21 ENCOUNTER — Encounter (HOSPITAL_BASED_OUTPATIENT_CLINIC_OR_DEPARTMENT_OTHER): Payer: Medicare Other | Attending: General Surgery | Admitting: General Surgery

## 2022-05-21 DIAGNOSIS — L97812 Non-pressure chronic ulcer of other part of right lower leg with fat layer exposed: Secondary | ICD-10-CM | POA: Diagnosis not present

## 2022-05-21 DIAGNOSIS — N1831 Chronic kidney disease, stage 3a: Secondary | ICD-10-CM | POA: Diagnosis not present

## 2022-05-21 DIAGNOSIS — Z7901 Long term (current) use of anticoagulants: Secondary | ICD-10-CM | POA: Diagnosis not present

## 2022-05-21 DIAGNOSIS — I480 Paroxysmal atrial fibrillation: Secondary | ICD-10-CM | POA: Insufficient documentation

## 2022-05-21 DIAGNOSIS — E785 Hyperlipidemia, unspecified: Secondary | ICD-10-CM | POA: Diagnosis not present

## 2022-05-21 DIAGNOSIS — N189 Chronic kidney disease, unspecified: Secondary | ICD-10-CM | POA: Insufficient documentation

## 2022-05-21 DIAGNOSIS — I5030 Unspecified diastolic (congestive) heart failure: Secondary | ICD-10-CM | POA: Insufficient documentation

## 2022-05-21 DIAGNOSIS — I130 Hypertensive heart and chronic kidney disease with heart failure and stage 1 through stage 4 chronic kidney disease, or unspecified chronic kidney disease: Secondary | ICD-10-CM | POA: Insufficient documentation

## 2022-05-21 DIAGNOSIS — L97819 Non-pressure chronic ulcer of other part of right lower leg with unspecified severity: Secondary | ICD-10-CM | POA: Diagnosis present

## 2022-05-21 DIAGNOSIS — S80811A Abrasion, right lower leg, initial encounter: Secondary | ICD-10-CM | POA: Diagnosis not present

## 2022-05-22 NOTE — Progress Notes (Signed)
KEYUR, NOVOTNY ([id number]) [id number].pdf Page 1 of 7 Visit Report for 05/21/2022 Arrival Information Details Patient Name: Date of Service: [name], [name] 05/21/2022 12:45 PM Medical Record Number: [id number] Patient Account Number: [id number] Date of Birth/Sex: Treating RN: 08-28-1922 (89 y.o. M) Primary Care Esperansa Sarabia: [name] Other Clinician: Referring Dayle Mcnerney: Treating Bryden Darden/Extender: Sela Hua in Treatment: 9 Visit Information History Since Last Visit All ordered tests and consults were completed: No Patient Arrived: Ambulatory Added or deleted any medications: No Arrival Time: 12:49 Any new allergies or adverse reactions: No Accompanied By: self Had a fall or experienced change in No Transfer Assistance: None activities of daily living that may affect Patient Identification Verified: Yes risk of falls: Secondary Verification Process Completed: Yes Signs or symptoms of abuse/neglect since last visito No Patient Has Alerts: Yes Hospitalized since last visit: No Patient Alerts: Patient on Blood Thinner Implantable device outside of the clinic excluding No Eliquis cellular tissue based products placed in the center since last visit: Pain Present Now: No Electronic Signature(s) Signed: 05/21/2022 3:05:36 PM By: [name] Entered By: [name] on 05/21/2022 12:49:51 -------------------------------------------------------------------------------- Encounter Discharge Information Details Patient Name: Date of Service: Gregg Cliche DO RE [name] 05/21/2022 12:45 PM Medical Record Number: [id number] Patient Account Number: [id number] Date of Birth/Sex: Treating RN: 07-29-1922 (89 y.o. [name] Primary Care Ramal Eckhardt: [name] Other Clinician: Referring Terry Bolotin: Treating Jeslyn Amsler/Extender: Sela Hua in Treatment: 9 Encounter Discharge Information Items Post Procedure Vitals Discharge  Condition: Stable Temperature (F): 97.6 Ambulatory Status: Ambulatory Pulse (bpm): 60 Discharge Destination: Home Respiratory Rate (breaths/min): 20 Transportation: Private Auto Blood Pressure (mmHg): 163/67 Accompanied By: self Schedule Follow-up Appointment: Yes Clinical Summary of Care: Patient Declined Electronic Signature(s) Signed: 05/21/2022 5:51:49 PM By: Gregg Catholic RN Entered By: [name] on 05/21/2022 17:38:41 Eleva, [name] ([id number] Page 2 of 7 -------------------------------------------------------------------------------- Lower Extremity Assessment Details Patient Name: Date of Service: [name], PAUMEN DO RE [name] 05/21/2022 12:45 PM Medical Record Number: [id number] Patient Account Number: [id number] Date of Birth/Sex: Treating RN: 05/09/1922 (89 y.o. [name] Primary Care Kelley Polinsky: [name] Other Clinician: Referring Royden Bulman: Treating Chetan Mehring/Extender: Sela Hua in Treatment: 9 Edema Assessment Assessed: [Left: No] [Right: No] [Left: Edema] [Right: :] Calf Left: Right: Point of Measurement: From Medial Instep 35 cm Ankle Left: Right: Point of Measurement: From Medial Instep 22 cm Vascular Assessment Pulses: Dorsalis Pedis Palpable: [Right:Yes] Electronic Signature(s) Signed: 05/21/2022 5:51:49 PM By: Gregg Catholic RN Entered By: [name] on 05/21/2022 12:58:51 -------------------------------------------------------------------------------- Multi Wound Chart Details Patient Name: Date of Service: Gregg Cliche DO RE [name] 05/21/2022 12:45 PM Medical Record Number: [id number] Patient Account Number: [id number] Date of Birth/Sex: Treating RN: 04-12-1923 (89 y.o. M) Primary Care Takaya Hyslop: [name] Other Clinician: Referring Lavance Beazer: Treating Myrth Dahan/Extender: Sela Hua in Treatment: 9 Vital Signs Height(in): 71 Pulse(bpm): 60 Weight(lbs):  167 Blood Pressure(mmHg): 163/67 Body Mass Index(BMI): 23.3 Temperature(F): 97.6 Respiratory Rate(breaths/min): 20 [1:Photos:] [N/A:N/A] Right, Anterior Lower Leg N/A N/A Wound Location: Trauma N/A N/A Wounding Event: Abrasion N/A N/A Primary Etiology: Arrhythmia, Hypertension N/A N/A Comorbid History: 03/01/2022 N/A N/A Date Acquired: 9 N/A N/A Weeks of Treatment: Open N/A N/A Wound Status: No N/A N/A Wound Recurrence: 0.6x0.4x0.1 N/A N/A Measurements L x W x D (cm) 0.188 N/A N/A A (cm) : rea 0.019 N/A N/A Volume (cm) : 98.00% N/A N/A % Reduction in A rea: 98.00% N/A N/A % Reduction in Volume: Full Thickness Without  Exposed N/A N/A Classification: Support Structures Medium N/A N/A Exudate A mount: Serosanguineous N/A N/A Exudate Type: red, brown N/A N/A Exudate Color: Distinct, outline attached N/A N/A Wound Margin: Small (1-33%) N/A N/A Granulation A mount: Pink N/A N/A Granulation Quality: Large (67-100%) N/A N/A Necrotic A mount: Eschar, Adherent Slough N/A N/A Necrotic Tissue: Fat Layer (Subcutaneous Tissue): Yes N/A N/A Exposed Structures: Fascia: No Tendon: No Muscle: No Joint: No Bone: No Small (1-33%) N/A N/A Epithelialization: Debridement - Selective/Open Wound N/A N/A Debridement: Pre-procedure Verification/Time Out 13:00 N/A N/A Taken: Lidocaine 4% Topical Solution N/A N/A Pain Control: Necrotic/Eschar, Slough N/A N/A Tissue Debrided: Non-Viable Tissue N/A N/A Level: 0.24 N/A N/A Debridement A (sq cm): rea Curette N/A N/A Instrument: Minimum N/A N/A Bleeding: Pressure N/A N/A Hemostasis A chieved: 0 N/A N/A Procedural Pain: 0 N/A N/A Post Procedural Pain: Procedure was tolerated well N/A N/A Debridement Treatment Response: 0.6x0.4x0.1 N/A N/A Post Debridement Measurements L x W x D (cm) 0.019 N/A N/A Post Debridement Volume: (cm) Scarring: Yes N/A N/A Periwound Skin Texture: No Abnormalities Noted N/A  N/A Periwound Skin Moisture: Hemosiderin Staining: Yes N/A N/A Periwound Skin Color: No Abnormality N/A N/A Temperature: Debridement N/A N/A Procedures Performed: Treatment Notes Electronic Signature(s) Signed: 05/21/2022 1:28:13 PM By: Fredirick Maudlin MD FACS Entered By: Fredirick Maudlin on 05/21/2022 13:28:13 -------------------------------------------------------------------------------- Multi-Disciplinary Care Plan Details Patient Name: Date of Service: Gregg Cliche DO RE [name] 05/21/2022 12:45 PM Medical Record Number: [id number] Patient Account Number: [id number] Date of Birth/Sex: Treating RN: 1922-06-11 (89 y.o. [name] Primary Care Stancil Deisher: [name] Other Clinician: Referring Tyrina Hines: Treating Mikhala Kenan/Extender: Sela Hua in Treatment: [place] reviewed with physician [address] [name], [name] ([id number]) [id number] Page 4 of 7 Pain, Acute or Chronic Nursing Diagnoses: Pain, acute or chronic: actual or potential Goals: Patient/caregiver will verbalize adequate pain control between visits Date Initiated: 03/19/2022 Target Resolution Date: 07/11/2022 Goal Status: Active Interventions: Encourage patient to take pain medications as prescribed Notes: Venous Leg Ulcer Nursing Diagnoses: Knowledge deficit related to disease process and management Potential for venous Insuffiency (use before diagnosis confirmed) Goals: Patient will maintain optimal edema control Date Initiated: 05/04/2022 Target Resolution Date: 07/11/2022 Goal Status: Active Interventions: Assess peripheral edema status every visit. Compression as ordered Treatment Activities: Therapeutic compression applied : 05/04/2022 Notes: Electronic Signature(s) Signed: 05/21/2022 5:51:49 PM By: Gregg Catholic RN Entered By: [name] on 05/21/2022  17:37:36 -------------------------------------------------------------------------------- Pain Assessment Details Patient Name: Date of Service: Gregg Cliche DO RE [name] 05/21/2022 12:45 PM Medical Record Number: [id number] Patient Account Number: [id number] Date of Birth/Sex: Treating RN: 11/11/1922 (89 y.o. M) Primary Care [place]: [name] Other Clinician: Referring Kirti Carl: Treating Rever Pichette/Extender: Sela Hua in Treatment: 9 Active Problems Location of Pain Severity and Description of Pain Patient Has Paino No Site Locations [name], [name] ([id number]) [id number] Page 5 of 7 Pain Management and Medication Current Pain Management: Electronic Signature(s) Signed: 05/21/2022 3:05:36 PM By: [name] Entered By: [name] on 05/21/2022 12:50:21 -------------------------------------------------------------------------------- Patient/Caregiver Education Details Patient Name: Date of Service: Gregg Cliche DO RE [name] 2/9/2024andnbsp12:45 PM Medical Record Number: [id number] Patient Account Number: [id number] Date of Birth/Gender: Treating RN: Apr 17, 1922 (89 y.o. [name] Primary Care Physician: [name] Other Clinician: Referring Physician: Treating Physician/Extender: Sela Hua in Treatment: 9 Education Assessment Education Provided To: Patient Education Topics Provided Wound/Skin Impairment: Methods: Explain/Verbal Responses: Return demonstration correctly Electronic Signature(s) Signed: 05/21/2022 5:51:49 PM By: Gregg Catholic RN Entered  By: [name] on 05/21/2022 17:37:50 -------------------------------------------------------------------------------- Wound Assessment Details Patient Name: Date of Service: [name], WALLETT DO RE [name] 05/21/2022 12:45 PM Medical Record Number: [id number] Patient Account Number: [id number] Date of Birth/Sex: Treating RN: [birth year] (89 y.o. [name] Primary Care Curtistine Pettitt: [name] Other Clinician: Referring Faheem Ziemann: Treating Dagoberto Nealy/Extender: [place], Herting ([id number]) [id number] Page 6 of 7 Weeks in Treatment: 9 Wound Status Wound Number: 1 Primary Etiology: Abrasion Wound Location: Right, Anterior Lower Leg Wound Status: Open Wounding Event: Trauma Comorbid History: Arrhythmia, Hypertension Date Acquired: 03/01/2022 Weeks Of Treatment: 9 Clustered Wound: No Photos Wound Measurements Length: (cm) 0.6 Width: (cm) 0.4 Depth: (cm) 0.1 Area: (cm) 0.188 Volume: (cm) 0.019 % Reduction in Area: 98% % Reduction in Volume: 98% Epithelialization: Small (1-33%) Tunneling: No Undermining: No Wound Description Classification: Full Thickness Without Exposed Suppor Wound Margin: Distinct, outline attached Exudate Amount: Medium Exudate Type: Serosanguineous Exudate Color: red, brown t Structures Foul Odor After Cleansing: No Slough/Fibrino Yes Wound Bed Granulation Amount: Small (1-33%) Exposed Structure Granulation Quality: Pink Fascia Exposed: No Necrotic Amount: Large (67-100%) Fat Layer (Subcutaneous Tissue) Exposed: Yes Necrotic Quality: Eschar, Adherent Slough Tendon Exposed: No Muscle Exposed: No Joint Exposed: No Bone Exposed: No Periwound Skin Texture Texture Color No Abnormalities Noted: No No Abnormalities Noted: No Scarring: Yes Hemosiderin Staining: Yes Moisture Temperature / Pain No Abnormalities Noted: Yes Temperature: No Abnormality Treatment Notes Wound #1 (Lower Leg) Wound Laterality: Right, Anterior Cleanser Peri-Wound Care Triamcinolone 15 (g) Discharge Instruction: Use triamcinolone 15 (g) as directed Sween Lotion (Moisturizing lotion) Discharge Instruction: Apply moisturizing lotion as directed Topical Primary Dressing Promogran Prisma Matrix, 4.34 (sq in) (silver collagen) Discharge Instruction: Moisten collagen  with saline or hydrogel Secondary Dressing JUMA, GATTUSO ([id number] Page 7 of 7 Zetuvit Plus 4x8 in Discharge Instruction: Apply over primary dressing as directed. Secured With Compression Wrap Kerlix Roll 4.5x3.1 (in/yd) Discharge Instruction: Apply Kerlix and Coban compression as directed. Coban Self-Adherent Wrap 4x5 (in/yd) Discharge Instruction: Apply over Kerlix as directed. Unnaboot w/Calamine, 4x10 (in/yd) Discharge Instruction: Apply Unnaboot at foot Compression Stockings Add-Ons Electronic Signature(s) Signed: 05/21/2022 5:51:49 PM By: Gregg Catholic RN Entered By: [name] on 05/21/2022 13:01:14 -------------------------------------------------------------------------------- [place] Details Patient Name: Date of Service: Gregg Cliche DO RE [name] 05/21/2022 12:45 PM Medical Record Number: [id number] Patient Account Number: [id number] Date of Birth/Sex: Treating RN: 09/11/1922 (89 y.o. M) Primary Care Jemiah Cuadra: [name] Other Clinician: Referring Novie Maggio: Treating Katrinna Travieso/Extender: Sela Hua in Treatment: 9 Vital Signs Time Taken: 12:49 Temperature (F): 97.6 Height (in): 71 Pulse (bpm): 60 Weight (lbs): 167 Respiratory Rate (breaths/min): 20 Body Mass Index (BMI): 23.3 Blood Pressure (mmHg): 163/67 Reference Range: 80 - 120 mg / dl Electronic Signature(s) Signed: 05/21/2022 3:05:36 PM By: [name] Entered By: [name] on 05/21/2022 12:50:14

## 2022-05-22 NOTE — Progress Notes (Signed)
[name] ([id number]) [phone].pdf Page 1 of 8 Visit Report for 05/21/2022 Chief Complaint Document Details Patient Name: Date of Service: [name], [name] 05/21/2022 12:45 PM Medical Record Number: [id number] Patient Account Number: [id number] Date of Birth/Sex: Treating RN: 21-Feb-1923 (89 y.o. M) Primary Care Provider: [name] Other Clinician: Referring Provider: Treating Provider/Extender: Sela Hua in Treatment: 9 Information Obtained from: Patient Chief Complaint Patient seen for complaints of Non-Healing Wound. Electronic Signature(s) Signed: 05/21/2022 1:28:23 PM By: Fredirick Maudlin MD FACS Entered By: Fredirick Maudlin on 05/21/2022 13:28:23 -------------------------------------------------------------------------------- Debridement Details Patient Name: Date of Service: [name] 05/21/2022 12:45 PM Medical Record Number: [id number] Patient Account Number: [id number] Date of Birth/Sex: Treating RN: 24-Apr-1922 (89 y.o. [name] Primary Care Provider: [name] Other Clinician: Referring Provider: Treating Provider/Extender: Sela Hua in Treatment: 9 Debridement Performed for Assessment: Wound #1 Right,Anterior Lower Leg Performed By: Physician Fredirick Maudlin, MD Debridement Type: Debridement Level of Consciousness (Pre-procedure): Awake and Alert Pre-procedure Verification/Time Out Yes - 13:00 Taken: Start Time: 13:00 Pain Control: Lidocaine 4% T opical Solution T Area Debrided (L x W): otal 0.6 (cm) x 0.4 (cm) = 0.24 (cm) Tissue and other material debrided: Non-Viable, Eschar, [place] Level: Non-Viable Tissue Debridement Description: Selective/Open Wound Instrument: Curette Bleeding: Minimum Hemostasis Achieved: Pressure End Time: 13:01 Procedural Pain: 0 Post Procedural Pain: 0 Response to Treatment: Procedure was tolerated well Level of  Consciousness (Post- Awake and Alert procedure): Post Debridement Measurements of Total Wound Length: (cm) 0.6 Width: (cm) 0.4 Depth: (cm) 0.1 Volume: (cm) 0.019 Character of Wound/Ulcer Post Debridement: Improved Post Procedure Diagnosis [name] ([id number] Page 2 of 8 Same as Pre-procedure Notes Scribed for Dr. Celine Ahr by J.Scotton Electronic Signature(s) Signed: 05/21/2022 1:35:58 PM By: Fredirick Maudlin MD FACS Signed: 05/21/2022 5:51:49 PM By: Dellie Catholic RN Entered By: Dellie Catholic on 05/21/2022 13:06:08 -------------------------------------------------------------------------------- HPI Details Patient Name: Date of Service: [name] 05/21/2022 12:45 PM Medical Record Number: [id number] Patient Account Number: [id number] Date of Birth/Sex: Treating RN: 08-14-1922 (89 y.o. M Primary Care Provider: [name] Other Clinician: Referring Provider: Treating Provider/Extender: Sela Hua in Treatment: 9 History of Present Illness HPI Description: ADMISSION 03/19/2022 This is a 89 year old man with a past medical history significant for coronary artery bypass with saphenous vein harvest from the right leg resulting in chronic edema, hypertension, stroke, paroxysmal atrial fibrillation on chronic anticoagulation, CHF, and stage III chronic kidney disease.. Near the beginning of November, he struck his leg on a step while getting into a bus. He developed a wound and went to urgent care but it did not seem to need any repair. It stayed open and he developed some discomfort and redness. There was some serous drainage and he presented to the emergency room on November 13. He was prescribed doxycycline and given bacitracin due to concern for infection. His PCP saw him on November 21 and recommended moist gauze dressing changes with Xeroform. He is accompanied today by his significant other who says she  has been dressing it with various topical agents including the mupirocin that was prescribed in the emergency room as well as Vaseline and a T pad. He was unable to tolerate ABIs today. elfa On his right anterior tibial surface, there is a wound that shows evidence of prior healing. There is a yellow dry eschar over the entire surface. Underneath this, there is some nonviable subcutaneous tissue and slough.  He has 1+ nonpitting edema in the leg. 12/15; second visit for this 89 year old man who lives in the friend's home [place] complex. His wound came from trauma after a almost fall getting on the activity bus.. Last week we used Iodoflex with 3 layer compression. He complains about pain in his right leg at night only which is relieved by either dangling his leg over the side of the bed or standing 04/13/2022: The wound is substantially smaller and shallower from when I saw it last. There is crusted Iodoflex along with rubbery slough on the wound surface. 04/20/2022: The wound is smaller and cleaner today. Still with a fair amount of rubbery slough, however. 04/27/2022: The wound is smaller and shallower today. Much less slough accumulation. 05/04/2022: The wound continues to contract. It is fairly clean with just a little slough and eschar accumulation. Edema control is inadequate. 05/12/2022: The wound is a little bit narrower. There is slough and eschar buildup. Good granulation tissue is filling in at the base. 05/21/2022: The wound has contracted further this week. There is still some slough on the surface. There is granulation tissue at the base. Electronic Signature(s) Signed: 05/21/2022 1:29:06 PM By: Fredirick Maudlin MD FACS Entered By: Fredirick Maudlin on 05/21/2022 13:29:06 -------------------------------------------------------------------------------- Physical Exam Details Patient Name: Date of Service: [name] 05/21/2022 12:45 PM Medical Record Number: [id number] Patient  Account Number: [id number] Date of Birth/Sex: Treating RN: [birth year] (89 y.o. M Primary Care Provider: [name] Other Clinician: Referring Provider: Treating Provider/Extender: [name] ([id number]) [id number] Page 3 of 8 Weeks in Treatment: 9 Constitutional Hypertensive, asymptomatic. . . . no acute distress. Respiratory Normal work of breathing on room air. Notes 05/21/2022: The wound has contracted further this week. There is still some slough on the surface. There is granulation tissue at the base. Electronic Signature(s) Signed: 05/21/2022 1:29:34 PM By: Fredirick Maudlin MD FACS Entered By: Fredirick Maudlin on 05/21/2022 13:29:34 -------------------------------------------------------------------------------- Physician Orders Details Patient Name: Date of Service: [name] 05/21/2022 12:45 PM Medical Record Number: [id number] Patient Account Number: [id number] Date of Birth/Sex: Treating RN: May 02, 1922 (89 y.o. [name] Primary Care Provider: [name] Other Clinician: Referring Provider: Treating Provider/Extender: Sela Hua in Treatment: 9 Verbal / Phone Orders: No Diagnosis Coding ICD-10 Coding Code Description [phone] Non-pressure chronic ulcer of other part of right lower leg with fat layer exposed I10 Essential (primary) hypertension R60.0 Localized edema N18.31 Chronic kidney disease, stage 3a I50.30 Unspecified diastolic (congestive) heart failure Follow-up Appointments ppointment in 1 week. - Dr. Celine Ahr - Room 3 Return A Anesthetic (In clinic) Topical Lidocaine 5% applied to wound bed - Used in Clinic Bathing/ Shower/ Hygiene May shower with protection but do not get wound dressing(s) wet. Protect dressing(s) with water repellant cover (for example, large plastic bag) or a cast cover and may then take shower. Edema Control - Lymphedema / SCD /  Other void standing for long periods of time. - Elevate the legs throughout the day. A Wound Treatment Wound #1 - Lower Leg Wound Laterality: Right, Anterior Peri-Wound Care: Triamcinolone 15 (g) 1 x Per Week/30 Days Discharge Instructions: Use triamcinolone 15 (g) as directed Peri-Wound Care: Sween Lotion (Moisturizing lotion) 1 x Per Week/30 Days Discharge Instructions: Apply moisturizing lotion as directed Prim Dressing: Promogran Prisma Matrix, 4.34 (sq in) (silver collagen) 1 x Per Week/30 Days ary Discharge Instructions: Moisten collagen with saline or hydrogel Secondary Dressing: Zetuvit Plus 4x8 in  1 x Per Week/30 Days Discharge Instructions: Apply over primary dressing as directed. Compression Wrap: Kerlix Roll 4.5x3.1 (in/yd) 1 x Per Week/30 Days Discharge Instructions: Apply Kerlix and Coban compression as directed. [name] ([id number]) [phone].pdf Page 4 of 8 Compression Wrap: Coban Self-Adherent Wrap 4x5 (in/yd) 1 x Per Week/30 Days Discharge Instructions: Apply over Kerlix as directed. Compression Wrap: Unnaboot w/Calamine, 4x10 (in/yd) 1 x Per Week/30 Days Discharge Instructions: Apply Unnaboot at foot Electronic Signature(s) Signed: 05/21/2022 1:35:58 PM By: Fredirick Maudlin MD FACS Entered By: Fredirick Maudlin on 05/21/2022 13:31:20 -------------------------------------------------------------------------------- Problem List Details Patient Name: Date of Service: [name] 05/21/2022 12:45 PM Medical Record Number: [id number] Patient Account Number: [id number] Date of Birth/Sex: Treating RN: [birth year] (89 y.o. M) Primary Care Provider: [name] Other Clinician: Referring Provider: Treating Provider/Extender: Sela Hua in Treatment: 9 Active Problems ICD-10 Encounter Code Description Active Date MDM Diagnosis [phone] Non-pressure chronic ulcer of other part of right lower leg with fat layer  03/19/2022 No Yes exposed [place] (primary) hypertension 03/19/2022 No Yes R60.0 Localized edema 03/19/2022 No Yes N18.31 Chronic kidney disease, stage 3a 03/19/2022 No Yes I50.30 Unspecified diastolic (congestive) heart failure 03/19/2022 No Yes Inactive Problems Resolved Problems Electronic Signature(s) Signed: 05/21/2022 1:22:58 PM By: Fredirick Maudlin MD FACS Entered By: Fredirick Maudlin on 05/21/2022 13:22:57 Progress Note Details -------------------------------------------------------------------------------- [name] ([id number]) [id number] Page 5 of 8 Patient Name: Date of Service: [name], LEHRMAN DO RE [name] 05/21/2022 12:45 PM Medical Record Number: [id number] Patient Account Number: [id number] Date of Birth/Sex: Treating RN: 07/03/1922 (89 y.o. M) Primary Care Provider: [name] Other Clinician: Referring Provider: Treating Provider/Extender: Sela Hua in Treatment: 9 Subjective Chief Complaint Information obtained from Patient Patient seen for complaints of Non-Healing Wound. History of Present Illness (HPI) ADMISSION 03/19/2022 This is a 89 year old man with a past medical history significant for coronary artery bypass with saphenous vein harvest from the right leg resulting in chronic edema, hypertension, stroke, paroxysmal atrial fibrillation on chronic anticoagulation, CHF, and stage III chronic kidney disease.. Near the beginning of November, he struck his leg on a step while getting into a bus. He developed a wound and went to urgent care but it did not seem to need any repair. It stayed open and he developed some discomfort and redness. There was some serous drainage and he presented to the emergency room on November 13. He was prescribed doxycycline and given bacitracin due to concern for infection. His PCP saw him on November 21 and recommended moist gauze dressing changes with Xeroform. He is accompanied today  by his significant other who says she has been dressing it with various topical agents including the mupirocin that was prescribed in the emergency room as well as Vaseline and a T pad. He was unable to tolerate ABIs today. elfa On his right anterior tibial surface, there is a wound that shows evidence of prior healing. There is a yellow dry eschar over the entire surface. Underneath this, there is some nonviable subcutaneous tissue and slough. He has 1+ nonpitting edema in the leg. 12/15; second visit for this 68 year old man who lives in the friend's home [place] retirement complex. His wound came from trauma after a almost fall getting on the activity bus.. Last week we used Iodoflex with 3 layer compression. He complains about pain in his right leg at night only which is relieved by either dangling his leg over the side of the bed or standing 04/13/2022:  The wound is substantially smaller and shallower from when I saw it last. There is crusted Iodoflex along with rubbery slough on the wound surface. 04/20/2022: The wound is smaller and cleaner today. Still with a fair amount of rubbery slough, however. 04/27/2022: The wound is smaller and shallower today. Much less slough accumulation. 05/04/2022: The wound continues to contract. It is fairly clean with just a little slough and eschar accumulation. Edema control is inadequate. 05/12/2022: The wound is a little bit narrower. There is slough and eschar buildup. Good granulation tissue is filling in at the base. 05/21/2022: The wound has contracted further this week. There is still some slough on the surface. There is granulation tissue at the base. Patient History Information obtained from Patient. Social History Never smoker, Marital Status - Married, Alcohol Use - Rarely, Drug Use - No History, Caffeine Use - Daily. Medical History Cardiovascular Patient has history of Arrhythmia - Hx: RBBB; Paroxysmal atrial Fib., Hypertension Hospitalization/Surgery  History - T without cardioversion ; Hernia repair;2000 cardiac Bypass. ee Medical A Surgical History Notes nd Eyes Macular degeneration in Left eye Cardiovascular Hx: CASH with CABG 2000 with LIMA to LAD,SV graft. Hx: Hyperlipidemia Hx: stroke Gastrointestinal GERD Genitourinary Hx: BPH Objective Constitutional Hypertensive, asymptomatic. no acute distress. Vitals Time Taken: 12:49 PM, Height: 71 in, Weight: 167 lbs, BMI: 23.3, Temperature: 97.6 F, Pulse: 60 bpm, Respiratory Rate: 20 breaths/min, Blood Pressure: 163/67 mmHg. Respiratory [name] ([id number]) [phone].pdf Page 6 of 8 Normal work of breathing on room air. General Notes: 05/21/2022: The wound has contracted further this week. There is still some slough on the surface. There is granulation tissue at the base. Integumentary (Hair, Skin) Wound #1 status is Open. Original cause of wound was Trauma. The date acquired was: 03/01/2022. The wound has been in treatment 9 weeks. The wound is located on the Right,Anterior Lower Leg. The wound measures 0.6cm length x 0.4cm width x 0.1cm depth; 0.188cm^2 area and 0.019cm^3 volume. There is Fat Layer (Subcutaneous Tissue) exposed. There is no tunneling or undermining noted. There is a medium amount of serosanguineous drainage noted. The wound margin is distinct with the outline attached to the wound base. There is small (1-33%) pink granulation within the wound bed. There is a large (67-100%) amount of necrotic tissue within the wound bed including Eschar and Adherent Slough. The periwound skin appearance had no abnormalities noted for moisture. The periwound skin appearance exhibited: Scarring, Hemosiderin Staining. Periwound temperature was noted as No Abnormality. Assessment Active Problems ICD-10 Non-pressure chronic ulcer of other part of right lower leg with fat layer exposed Essential (primary) hypertension Localized edema Chronic kidney  disease, stage 3a Unspecified diastolic (congestive) heart failure Procedures Wound #1 Pre-procedure diagnosis of Wound #1 is an Abrasion located on the Right,Anterior Lower Leg . There was a Selective/Open Wound Non-Viable Tissue Debridement with a total area of 0.24 sq cm performed by Fredirick Maudlin, MD. With the following instrument(s): Curette to remove Non-Viable tissue/material. Material removed includes Eschar and Slough and after achieving pain control using Lidocaine 4% T opical Solution. No specimens were taken. A time out was conducted at 13:00, prior to the start of the procedure. A Minimum amount of bleeding was controlled with Pressure. The procedure was tolerated well with a pain level of 0 throughout and a pain level of 0 following the procedure. Post Debridement Measurements: 0.6cm length x 0.4cm width x 0.1cm depth; 0.019cm^3 volume. Character of Wound/Ulcer Post Debridement is improved. Post procedure Diagnosis Wound #1: Same as  Pre-Procedure General Notes: Scribed for Dr. Celine Ahr by J.Scotton. Plan Follow-up Appointments: Return Appointment in 1 week. - Dr. Celine Ahr - Room 3 Anesthetic: (In clinic) Topical Lidocaine 5% applied to wound bed - Used in Clinic Bathing/ Shower/ Hygiene: May shower with protection but do not get wound dressing(s) wet. Protect dressing(s) with water repellant cover (for example, large plastic bag) or a cast cover and may then take shower. Edema Control - Lymphedema / SCD / Other: Avoid standing for long periods of time. - Elevate the legs throughout the day. WOUND #1: - Lower Leg Wound Laterality: Right, Anterior Peri-Wound Care: Triamcinolone 15 (g) 1 x Per Week/30 Days Discharge Instructions: Use triamcinolone 15 (g) as directed Peri-Wound Care: Sween Lotion (Moisturizing lotion) 1 x Per Week/30 Days Discharge Instructions: Apply moisturizing lotion as directed Prim Dressing: Promogran Prisma Matrix, 4.34 (sq in) (silver collagen) 1 x Per  Week/30 Days ary Discharge Instructions: Moisten collagen with saline or hydrogel Secondary Dressing: Zetuvit Plus 4x8 in 1 x Per Week/30 Days Discharge Instructions: Apply over primary dressing as directed. Com pression Wrap: Kerlix Roll 4.5x3.1 (in/yd) 1 x Per Week/30 Days Discharge Instructions: Apply Kerlix and Coban compression as directed. Com pression Wrap: Coban Self-Adherent Wrap 4x5 (in/yd) 1 x Per Week/30 Days Discharge Instructions: Apply over Kerlix as directed. Com pression Wrap: Unnaboot w/Calamine, 4x10 (in/yd) 1 x Per Week/30 Days Discharge Instructions: Apply Unnaboot at foot 05/21/2022: The wound has contracted further this week. There is still some slough on the surface. There is granulation tissue at the base. I used a curette to debride the slough from his wound. He complained that his wrap slipped a little bit so we are going to use an Unna boot first layer to try and prevent this. Continue Kerlix and Coban. Follow-up in 1 week. [name] ([id number]) [id number].pdf Page 7 of 8 Electronic Signature(s) Signed: 05/21/2022 1:33:11 PM By: Fredirick Maudlin MD FACS Entered By: Fredirick Maudlin on 05/21/2022 13:33:11 -------------------------------------------------------------------------------- HxROS Details Patient Name: Date of Service: [name] 05/21/2022 12:45 PM Medical Record Number: [id number] Patient Account Number: [id number] Date of Birth/Sex: Treating RN: [birth year] (89 y.o. M) Primary Care Provider: [name] Other Clinician: Referring Provider: Treating Provider/Extender: Sela Hua in Treatment: 9 Information Obtained From Patient Eyes Medical History: Past Medical History Notes: Macular degeneration in Left eye Cardiovascular Medical History: Positive for: Arrhythmia - Hx: RBBB; Paroxysmal atrial Fib.; Hypertension Past Medical History Notes: Hx: CASH with CABG 2000 with LIMA to LAD,SV  graft. Hx: Hyperlipidemia Hx: stroke Gastrointestinal Medical History: Past Medical History Notes: GERD Genitourinary Medical History: Past Medical History Notes: Hx: BPH Immunizations Pneumococcal Vaccine: Received Pneumococcal Vaccination: Yes Received Pneumococcal Vaccination On or After 60th Birthday: Yes Implantable Devices None Hospitalization / Surgery History Type of Hospitalization/Surgery T without cardioversion ; Hernia repair;2000 cardiac Bypass ee Family and Social History Never smoker; Marital Status - Married; Alcohol Use: Rarely; Drug Use: No History; Caffeine Use: Daily; Financial Concerns: No; Food, Clothing or Shelter Needs: No; Support System Lacking: No; Transportation Concerns: No Electronic Signature(s) Signed: 05/21/2022 1:35:58 PM By: Fredirick Maudlin MD FACS Entered By: Fredirick Maudlin on 05/21/2022 13:29:11 [name] ([id number] Page 8 of 8 -------------------------------------------------------------------------------- SuperBill Details Patient Name: Date of Service: [name], LOUPE DO RE [name] 05/21/2022 Medical Record Number: [id number] Patient Account Number: [id number] Date of Birth/Sex: Treating RN: 1923-02-06 (89 y.o. M) Primary Care Provider: [name] Other Clinician: Referring Provider: Treating Provider/Extender: Sela Hua in Treatment: 9  Diagnosis Coding ICD-10 Codes Code Description [phone] Non-pressure chronic ulcer of other part of right lower leg with fat layer exposed I10 Essential (primary) hypertension R60.0 Localized edema N18.31 Chronic kidney disease, stage 3a I50.30 Unspecified diastolic (congestive) heart failure Facility Procedures : CPT4 Code: [id number] Description: [id number] - DEBRIDE WOUND 1ST 20 SQ CM OR < ICD-10 Diagnosis Description L97.812 Non-pressure chronic ulcer of other part of right lower leg with fat layer expos Modifier: ed Quantity:  1 Physician Procedures : CPT4 Code Description Modifier [id number] - WC PHYS LEVEL 3 - EST PT 25 ICD-10 Diagnosis Description L97.812 Non-pressure chronic ulcer of other part of right lower leg with fat layer exposed I50.30 Unspecified diastolic (congestive) heart failure  R60.0 Localized edema N18.31 Chronic kidney disease, stage 3a Quantity: 1 : [id number] - WC PHYS DEBR WO ANESTH 20 SQ CM ICD-10 Diagnosis Description [id number] Non-pressure chronic ulcer of other part of right lower leg with fat layer exposed Quantity: 1 Electronic Signature(s) Signed: 05/21/2022 1:33:59 PM By: Fredirick Maudlin MD FACS Entered By: Fredirick Maudlin on 05/21/2022 13:33:58

## 2022-05-26 DIAGNOSIS — H353221 Exudative age-related macular degeneration, left eye, with active choroidal neovascularization: Secondary | ICD-10-CM | POA: Diagnosis not present

## 2022-05-31 ENCOUNTER — Encounter (HOSPITAL_BASED_OUTPATIENT_CLINIC_OR_DEPARTMENT_OTHER): Payer: Medicare Other | Admitting: General Surgery

## 2022-05-31 DIAGNOSIS — S80811A Abrasion, right lower leg, initial encounter: Secondary | ICD-10-CM | POA: Diagnosis not present

## 2022-05-31 DIAGNOSIS — N189 Chronic kidney disease, unspecified: Secondary | ICD-10-CM | POA: Diagnosis not present

## 2022-05-31 DIAGNOSIS — I480 Paroxysmal atrial fibrillation: Secondary | ICD-10-CM | POA: Diagnosis not present

## 2022-05-31 DIAGNOSIS — L97812 Non-pressure chronic ulcer of other part of right lower leg with fat layer exposed: Secondary | ICD-10-CM | POA: Diagnosis not present

## 2022-05-31 DIAGNOSIS — I130 Hypertensive heart and chronic kidney disease with heart failure and stage 1 through stage 4 chronic kidney disease, or unspecified chronic kidney disease: Secondary | ICD-10-CM | POA: Diagnosis not present

## 2022-05-31 DIAGNOSIS — I5030 Unspecified diastolic (congestive) heart failure: Secondary | ICD-10-CM | POA: Diagnosis not present

## 2022-05-31 DIAGNOSIS — N1831 Chronic kidney disease, stage 3a: Secondary | ICD-10-CM | POA: Diagnosis not present

## 2022-06-01 NOTE — Progress Notes (Signed)
HASSAN, ALOISI ([id number]) [phone].pdf Page 1 of 7 Visit Report for 05/31/2022 Arrival Information Details Patient Name: Date of Service: [name], BRIDENBAUGH DO RE E. 05/31/2022 12:45 PM Medical Record Number: [id number] Patient Account Number: [id number] Date of Birth/Sex: Treating RN: Feb 21, 1923 (89 y.o. [name] Primary Care Jassen Sarver: [name] Other Clinician: Referring Daniyah Fohl: Treating Taiten Brawn/Extender: Sela Hua in Treatment: 10 Visit Information History Since Last Visit Added or deleted any medications: No Patient Arrived: Ambulatory Any new allergies or adverse reactions: No Arrival Time: 12:38 Had a fall or experienced change in No Accompanied By: self activities of daily living that may affect Transfer Assistance: None risk of falls: Patient Has Alerts: Yes Signs or symptoms of abuse/neglect since last visito No Patient Alerts: Patient on Blood Thinner Hospitalized since last visit: No Eliquis Implantable device outside of the clinic excluding No cellular tissue based products placed in the center since last visit: Has Dressing in Place as Prescribed: Yes Has Compression in Place as Prescribed: Yes Pain Present Now: No Electronic Signature(s) Signed: 05/31/2022 5:41:58 PM By: Dellie Catholic RN Entered By: Dellie Catholic on 05/31/2022 12:54:28 -------------------------------------------------------------------------------- Encounter Discharge Information Details Patient Name: Date of Service: Gregg Cliche DO RE E. 05/31/2022 12:45 PM Medical Record Number: [id number] Patient Account Number: [id number] Date of Birth/Sex: Treating RN: [birth year] (89 y.o. [name] Primary Care Orin Eberwein: [name] Other Clinician: Referring Tiye Huwe: Treating Delinda Malan/Extender: Sela Hua in Treatment: 10 Encounter Discharge Information Items Post Procedure Vitals Discharge Condition:  Stable Temperature (F): 97.5 Ambulatory Status: Ambulatory Pulse (bpm): 51 Discharge Destination: Home Respiratory Rate (breaths/min): 18 Transportation: Private Auto Blood Pressure (mmHg): 175/62 Accompanied By: self Schedule Follow-up Appointment: Yes Clinical Summary of Care: Patient Declined Electronic Signature(s) Signed: 05/31/2022 5:41:58 PM By: Dellie Catholic RN Entered By: Dellie Catholic on 05/31/2022 [place], [name] ([id number] Page 2 of 7 -------------------------------------------------------------------------------- Lower Extremity Assessment Details Patient Name: Date of Service: [name], ISENSEE DO RE E. 05/31/2022 12:45 PM Medical Record Number: [id number] Patient Account Number: [id number] Date of Birth/Sex: Treating RN: 09/10/1922 (89 y.o. [name] Primary Care Manish Ruggiero: [name] Other Clinician: Referring Tydarius Yawn: Treating Crysta Gulick/Extender: [name] Weeks in Treatment: 10 Edema Assessment Assessed: Shirlyn Goltz: No] [Right: No] [Left: Edema] [Right: :] Calf Left: Right: Point of Measurement: From Medial Instep 35 cm Ankle Left: Right: Point of Measurement: From Medial Instep 22 cm Vascular Assessment Pulses: Dorsalis Pedis Palpable: [Right:Yes] Electronic Signature(s) Signed: 05/31/2022 5:41:58 PM By: Dellie Catholic RN Entered By: Dellie Catholic on 05/31/2022 12:55:08 -------------------------------------------------------------------------------- Multi Wound Chart Details Patient Name: Date of Service: Gregg Cliche DO RE E. 05/31/2022 12:45 PM Medical Record Number: [id number] Patient Account Number: [id number] Date of Birth/Sex: Treating RN: [birth year] (89 y.o. M) Primary Care Jodine Muchmore: [name] Other Clinician: Referring Yanixan Mellinger: Treating Karizma Cheek/Extender: Sela Hua in Treatment: 10 Vital Signs Height(in): 71 Pulse(bpm): 51 Weight(lbs):  167 Blood Pressure(mmHg): 175/62 Body Mass Index(BMI): 23.3 Temperature(F): 97.5 Respiratory Rate(breaths/min): 18 [1:Photos:] [N/A:N/A] Right, Anterior Lower Leg N/A N/A Wound Location: Trauma N/A N/A Wounding Event: Abrasion N/A N/A Primary Etiology: Arrhythmia, Hypertension N/A N/A Comorbid History: 03/01/2022 N/A N/A Date Acquired: 10 N/A N/A Weeks of Treatment: Open N/A N/A Wound Status: No N/A N/A Wound Recurrence: 0.1x0.1x0.1 N/A N/A Measurements L x W x D (cm) 0.008 N/A N/A A (cm) : rea 0.001 N/A N/A Volume (cm) : 99.90% N/A N/A % Reduction in A rea: 99.90% N/A N/A % Reduction in Volume: Full Thickness Without  Exposed N/A N/A Classification: Support Structures Medium N/A N/A Exudate A mount: Serosanguineous N/A N/A Exudate Type: red, brown N/A N/A Exudate Color: Distinct, outline attached N/A N/A Wound Margin: Small (1-33%) N/A N/A Granulation A mount: Pink N/A N/A Granulation Quality: Large (67-100%) N/A N/A Necrotic A mount: Eschar N/A N/A Necrotic Tissue: Fat Layer (Subcutaneous Tissue): Yes N/A N/A Exposed Structures: Fascia: No Tendon: No Muscle: No Joint: No Bone: No Small (1-33%) N/A N/A Epithelialization: Debridement - Selective/Open Wound N/A N/A Debridement: Pre-procedure Verification/Time Out 12:59 N/A N/A Taken: Lidocaine 4% Topical Solution N/A N/A Pain Control: Necrotic/Eschar N/A N/A Tissue Debrided: Non-Viable Tissue N/A N/A Level: 0.01 N/A N/A Debridement A (sq cm): rea Curette N/A N/A Instrument: Minimum N/A N/A Bleeding: Pressure N/A N/A Hemostasis A chieved: 0 N/A N/A Procedural Pain: 0 N/A N/A Post Procedural Pain: Procedure was tolerated well N/A N/A Debridement Treatment Response: 0.1x0.1x0.1 N/A N/A Post Debridement Measurements L x W x D (cm) 0.001 N/A N/A Post Debridement Volume: (cm) Scarring: Yes N/A N/A Periwound Skin Texture: No Abnormalities Noted N/A N/A Periwound Skin  Moisture: Hemosiderin Staining: Yes N/A N/A Periwound Skin Color: No Abnormality N/A N/A Temperature: Debridement N/A N/A Procedures Performed: Treatment Notes Electronic Signature(s) Signed: 05/31/2022 1:34:17 PM By: Fredirick Maudlin MD FACS Entered By: Fredirick Maudlin on 05/31/2022 13:34:17 -------------------------------------------------------------------------------- Multi-Disciplinary Care Plan Details Patient Name: Date of Service: Gregg Cliche DO RE E. 05/31/2022 12:45 PM Medical Record Number: [id number] Patient Account Number: [id number] Date of Birth/Sex: Treating RN: 08/28/1922 (89 y.o. [name] Primary Care Cerena Baine: [name] Other Clinician: Referring Abelino Tippin: Treating Particia Strahm/Extender: Sela Hua in Treatment: [place] reviewed with physician [id number] RONDEY, [place] ([id number]) [id number] Page 4 of 7 Pain, Acute or Chronic Nursing Diagnoses: Pain, acute or chronic: actual or potential Goals: Patient/caregiver will verbalize adequate pain control between visits Date Initiated: 03/19/2022 Target Resolution Date: 07/11/2022 Goal Status: Active Interventions: Encourage patient to take pain medications as prescribed Notes: Venous Leg Ulcer Nursing Diagnoses: Knowledge deficit related to disease process and management Potential for venous Insuffiency (use before diagnosis confirmed) Goals: Patient will maintain optimal edema control Date Initiated: 05/04/2022 Target Resolution Date: 07/11/2022 Goal Status: Active Interventions: Assess peripheral edema status every visit. Compression as ordered Treatment Activities: Therapeutic compression applied : 05/04/2022 Notes: Electronic Signature(s) Signed: 05/31/2022 5:41:58 PM By: Dellie Catholic RN Entered By: Dellie Catholic on 05/31/2022  13:10:52 -------------------------------------------------------------------------------- Pain Assessment Details Patient Name: Date of Service: [name], MICHALAK DO RE E. 05/31/2022 12:45 PM Medical Record Number: [id number] Patient Account Number: [id number] Date of Birth/Sex: Treating RN: 1922/07/21 (89 y.o. [name] Primary Care Abbott Jasinski: [name] Other Clinician: Referring Helen Cuff: Treating Sulema Braid/Extender: Sela Hua in Treatment: 10 Active Problems Location of Pain Severity and Description of Pain Patient Has Paino No Site Locations [name], Gregg E ([id number]) [id number] Page 5 of 7 Pain Management and Medication Current Pain Management: Electronic Signature(s) Signed: 05/31/2022 5:41:58 PM By: Dellie Catholic RN Entered By: Dellie Catholic on 05/31/2022 12:55:01 -------------------------------------------------------------------------------- Patient/Caregiver Education Details Patient Name: Date of Service: Gregg Cliche DO RE E. 2/19/2024andnbsp12:45 PM Medical Record Number: [id number] Patient Account Number: [id number] Date of Birth/Gender: Treating RN: 10/14/1922 (89 y.o. [name] Primary Care Physician: [name] Other Clinician: Referring Physician: Treating Physician/Extender: Sela Hua in Treatment: 10 Education Assessment Education Provided To: Patient Education Topics Provided Wound/Skin Impairment: Methods: Explain/Verbal Responses: Return demonstration correctly Electronic Signature(s) Signed: 05/31/2022 5:41:58 PM By: Dellie Catholic RN Entered  By: Dellie Catholic on 05/31/2022 17:33:05 -------------------------------------------------------------------------------- Wound Assessment Details Patient Name: Date of Service: [name], MILILLO DO RE E. 05/31/2022 12:45 PM Medical Record Number: [id number] Patient Account Number: [id number] Date of Birth/Sex: Treating  RN: 1922/12/28 (89 y.o. [name] Primary Care Persia Lintner: [name] Other Clinician: Referring Deona Novitski: Treating Rogina Schiano/Extender: Keyvan, Deveaux ([id number]) [id number] Page 6 of 7 Weeks in Treatment: 10 Wound Status Wound Number: 1 Primary Etiology: Abrasion Wound Location: Right, Anterior Lower Leg Wound Status: Open Wounding Event: Trauma Comorbid History: Arrhythmia, Hypertension Date Acquired: 03/01/2022 Weeks Of Treatment: 10 Clustered Wound: No Photos Wound Measurements Length: (cm) 0.1 Width: (cm) 0.1 Depth: (cm) 0.1 Area: (cm) 0.008 Volume: (cm) 0.001 % Reduction in Area: 99.9% % Reduction in Volume: 99.9% Epithelialization: Small (1-33%) Tunneling: No Undermining: No Wound Description Classification: Full Thickness Without Exposed Suppor Wound Margin: Distinct, outline attached Exudate Amount: Medium Exudate Type: Serosanguineous Exudate Color: red, brown t Structures Foul Odor After Cleansing: No Slough/Fibrino Yes Wound Bed Granulation Amount: Small (1-33%) Exposed Structure Granulation Quality: Pink Fascia Exposed: No Necrotic Amount: Large (67-100%) Fat Layer (Subcutaneous Tissue) Exposed: Yes Necrotic Quality: Eschar Tendon Exposed: No Muscle Exposed: No Joint Exposed: No Bone Exposed: No Periwound Skin Texture Texture Color No Abnormalities Noted: No No Abnormalities Noted: No Scarring: Yes Hemosiderin Staining: Yes Moisture Temperature / Pain No Abnormalities Noted: Yes Temperature: No Abnormality Treatment Notes Wound #1 (Lower Leg) Wound Laterality: Right, Anterior Cleanser Peri-Wound Care Triamcinolone 15 (g) Discharge Instruction: Use triamcinolone 15 (g) as directed Sween Lotion (Moisturizing lotion) Discharge Instruction: Apply moisturizing lotion as directed Topical Primary Dressing Promogran Prisma Matrix, 4.34 (sq in) (silver collagen) Discharge  Instruction: Moisten collagen with saline or hydrogel Secondary Dressing [name], Gregg E ([id number]) [phone].pdf Page 7 of 7 Zetuvit Plus 4x8 in Discharge Instruction: Apply over primary dressing as directed. Secured With Compression Wrap Kerlix Roll 4.5x3.1 (in/yd) Discharge Instruction: Apply Kerlix and Coban compression as directed. Coban Self-Adherent Wrap 4x5 (in/yd) Discharge Instruction: Apply over Kerlix as directed. Unnaboot w/Calamine, 4x10 (in/yd) Discharge Instruction: Apply Unnaboot at foot Compression Stockings Add-Ons Electronic Signature(s) Signed: 05/31/2022 5:41:58 PM By: Dellie Catholic RN Entered By: Dellie Catholic on 05/31/2022 12:57:30 -------------------------------------------------------------------------------- Vitals Details Patient Name: Date of Service: Gregg Cliche DO RE E. 05/31/2022 12:45 PM Medical Record Number: [id number] Patient Account Number: [id number] Date of Birth/Sex: Treating RN: 1923-03-06 (89 y.o. [name] Primary Care Adiya Selmer: [name] Other Clinician: Referring Elizabth Palka: Treating Matthe Sloane/Extender: Sela Hua in Treatment: 10 Vital Signs Time Taken: 12:45 Temperature (F): 97.5 Height (in): 71 Pulse (bpm): 51 Weight (lbs): 167 Respiratory Rate (breaths/min): 18 Body Mass Index (BMI): 23.3 Blood Pressure (mmHg): 175/62 Reference Range: 80 - 120 mg / dl Electronic Signature(s) Signed: 05/31/2022 5:41:58 PM By: Dellie Catholic RN Entered By: Dellie Catholic on 05/31/2022 12:54:55

## 2022-06-01 NOTE — Progress Notes (Signed)
YOBANY, VALENZANO ([id number]) [phone].pdf Page 1 of 8 Visit Report for 05/31/2022 Chief Complaint Document Details Patient Name: Date of Service: [name], FLORKOWSKI DO RE E. 05/31/2022 12:45 PM Medical Record Number: [id number] Patient Account Number: [id number] Date of Birth/Sex: Treating RN: [birth year] (89 y.o. M) Primary Care Provider: [name] Other Clinician: Referring Provider: Treating Provider/Extender: Sela Hua in Treatment: 10 Information Obtained from: Patient Chief Complaint Patient seen for complaints of Non-Healing Wound. Electronic Signature(s) Signed: 05/31/2022 1:34:23 PM By: Fredirick Maudlin MD FACS Entered By: Fredirick Maudlin on 05/31/2022 13:34:23 -------------------------------------------------------------------------------- Debridement Details Patient Name: Date of Service: [name] 05/31/2022 12:45 PM Medical Record Number: [id number] Patient Account Number: [id number] Date of Birth/Sex: Treating RN: 1922-10-12 (89 y.o. [name] Primary Care Provider: [name] Other Clinician: Referring Provider: Treating Provider/Extender: Sela Hua in Treatment: 10 Debridement Performed for Assessment: Wound #1 Right,Anterior Lower Leg Performed By: Physician Fredirick Maudlin, MD Debridement Type: Debridement Level of Consciousness (Pre-procedure): Awake and Alert Pre-procedure Verification/Time Out Yes - 12:59 Taken: Start Time: 12:59 Pain Control: Lidocaine 4% T opical Solution T Area Debrided (L x W): otal 0.1 (cm) x 0.1 (cm) = 0.01 (cm) Tissue and other material debrided: Non-Viable, Eschar Level: Non-Viable Tissue Debridement Description: Selective/Open Wound Instrument: Curette Bleeding: Minimum Hemostasis Achieved: Pressure End Time: 13:00 Procedural Pain: 0 Post Procedural Pain: 0 Response to Treatment: Procedure was tolerated well Level of Consciousness  (Post- Awake and Alert procedure): Post Debridement Measurements of Total Wound Length: (cm) 0.1 Width: (cm) 0.1 Depth: (cm) 0.1 Volume: (cm) 0.001 Character of Wound/Ulcer Post Debridement: Improved Post Procedure Diagnosis CAINE, SIHARATH E ([id number] Page 2 of 8 Same as Pre-procedure Notes Scribed for Dr. Celine Ahr by J.Scotton Electronic Signature(s) Signed: 05/31/2022 5:01:55 PM By: Fredirick Maudlin MD FACS Signed: 05/31/2022 5:41:58 PM By: Dellie Catholic RN Entered By: Dellie Catholic on 05/31/2022 13:09:13 -------------------------------------------------------------------------------- HPI Details Patient Name: Date of Service: [name] 05/31/2022 12:45 PM Medical Record Number: [id number] Patient Account Number: [id number] Date of Birth/Sex: Treating RN: 12-Jun-1922 (89 y.o. M) Primary Care Provider: [name] Other Clinician: Referring Provider: Treating Provider/Extender: Sela Hua in Treatment: 10 History of Present Illness HPI Description: ADMISSION 03/19/2022 This is a 89 year old man with a past medical history significant for coronary artery bypass with saphenous vein harvest from the right leg resulting in chronic edema, hypertension, stroke, paroxysmal atrial fibrillation on chronic anticoagulation, CHF, and stage III chronic kidney disease.. Near the beginning of November, he struck his leg on a step while getting into a bus. He developed a wound and went to urgent care but it did not seem to need any repair. It stayed open and he developed some discomfort and redness. There was some serous drainage and he presented to the emergency room on November 13. He was prescribed doxycycline and given bacitracin due to concern for infection. His PCP saw him on November 21 and recommended moist gauze dressing changes with Xeroform. He is accompanied today by his significant other who says she has been  dressing it with various topical agents including the mupirocin that was prescribed in the emergency room as well as Vaseline and a T pad. He was unable to tolerate ABIs today. elfa On his right anterior tibial surface, there is a wound that shows evidence of prior healing. There is a yellow dry eschar over the entire surface. Underneath this, there is some nonviable subcutaneous tissue and slough. He has  1+ nonpitting edema in the leg. 12/15; second visit for this 74 year old man who lives in the friend's home [place] [facility] complex. His wound came from trauma after a almost fall getting on the activity bus.. Last week we used Iodoflex with 3 layer compression. He complains about pain in his right leg at night only which is relieved by either dangling his leg over the side of the bed or standing 04/13/2022: The wound is substantially smaller and shallower from when I saw it last. There is crusted Iodoflex along with rubbery slough on the wound surface. 04/20/2022: The wound is smaller and cleaner today. Still with a fair amount of rubbery slough, however. 04/27/2022: The wound is smaller and shallower today. Much less slough accumulation. 05/04/2022: The wound continues to contract. It is fairly clean with just a little slough and eschar accumulation. Edema control is inadequate. 05/12/2022: The wound is a little bit narrower. There is slough and eschar buildup. Good granulation tissue is filling in at the base. 05/21/2022: The wound has contracted further this week. There is still some slough on the surface. There is granulation tissue at the base. 05/31/2022: The wound is smaller again today. There is eschar on the surface. Edema control is improved. Electronic Signature(s) Signed: 05/31/2022 1:35:21 PM By: Fredirick Maudlin MD FACS Entered By: Fredirick Maudlin on 05/31/2022 13:35:21 -------------------------------------------------------------------------------- Physical Exam Details Patient Name: Date  of Service: [name] 05/31/2022 12:45 PM Medical Record Number: [id number] Patient Account Number: [id number] Date of Birth/Sex: Treating RN: [birth year] (89 y.o. EATHEN, TITMUS ([id number]) [id number] Page 3 of 8 Primary Care Provider: [name] Other Clinician: Referring Provider: Treating Provider/Extender: Sela Hua in Treatment: 10 Constitutional Hypertensive, asymptomatic. Bradycardic, asymptomatic. . . no acute distress. Respiratory Normal work of breathing on room air. Notes 05/31/2022: The wound is smaller again today. There is eschar on the surface. Edema control is improved. Electronic Signature(s) Signed: 05/31/2022 1:35:58 PM By: Fredirick Maudlin MD FACS Entered By: Fredirick Maudlin on 05/31/2022 13:35:58 -------------------------------------------------------------------------------- Physician Orders Details Patient Name: Date of Service: [name] 05/31/2022 12:45 PM Medical Record Number: [id number] Patient Account Number: [id number] Date of Birth/Sex: Treating RN: Sep 24, 1922 (89 y.o. [name] Primary Care Provider: [name] Other Clinician: Referring Provider: Treating Provider/Extender: Sela Hua in Treatment: 10 Verbal / Phone Orders: No Diagnosis Coding ICD-10 Coding Code Description [id number] Non-pressure chronic ulcer of other part of right lower leg with fat layer exposed I10 Essential (primary) hypertension R60.0 Localized edema N18.31 Chronic kidney disease, stage 3a I50.30 Unspecified diastolic (congestive) heart failure Follow-up Appointments ppointment in 1 week. - Dr. Celine Ahr - Room 3 Return A Anesthetic (In clinic) Topical Lidocaine 5% applied to wound bed - Used in Clinic Bathing/ Shower/ Hygiene May shower with protection but do not get wound dressing(s) wet. Protect dressing(s) with water repellant cover (for example, large  plastic bag) or a cast cover and may then take shower. Edema Control - Lymphedema / SCD / Other void standing for long periods of time. - Elevate the legs throughout the day. A Wound Treatment Wound #1 - Lower Leg Wound Laterality: Right, Anterior Peri-Wound Care: Triamcinolone 15 (g) 1 x Per Week/30 Days Discharge Instructions: Use triamcinolone 15 (g) as directed Peri-Wound Care: Sween Lotion (Moisturizing lotion) 1 x Per Week/30 Days Discharge Instructions: Apply moisturizing lotion as directed Prim Dressing: Promogran Prisma Matrix, 4.34 (sq in) (silver collagen) 1 x Per Week/30 Days ary Discharge Instructions: Moisten collagen  with saline or hydrogel Secondary Dressing: Zetuvit Plus 4x8 in 1 x Per Week/30 Days Discharge Instructions: Apply over primary dressing as directed. Compression Wrap: Kerlix Roll 4.5x3.1 (in/yd) 1 x Per Week/30 Days KEESHAUN, WIGGINGTON ([id number]) [phone].pdf Page 4 of 8 Discharge Instructions: Apply Kerlix and Coban compression as directed. Compression Wrap: Coban Self-Adherent Wrap 4x5 (in/yd) 1 x Per Week/30 Days Discharge Instructions: Apply over Kerlix as directed. Compression Wrap: Unnaboot w/Calamine, 4x10 (in/yd) 1 x Per Week/30 Days Discharge Instructions: Apply Unnaboot at foot Electronic Signature(s) Signed: 05/31/2022 5:01:55 PM By: Fredirick Maudlin MD FACS Entered By: Fredirick Maudlin on 05/31/2022 13:36:16 -------------------------------------------------------------------------------- Problem List Details Patient Name: Date of Service: [name] 05/31/2022 12:45 PM Medical Record Number: [id number] Patient Account Number: [id number] Date of Birth/Sex: Treating RN: 1922-09-30 (89 y.o. M) Primary Care Provider: [name] Other Clinician: Referring Provider: Treating Provider/Extender: Sela Hua in Treatment: 10 Active Problems ICD-10 Encounter Code Description Active Date  MDM Diagnosis [phone] Non-pressure chronic ulcer of other part of right lower leg with fat layer 03/19/2022 No Yes exposed [place] (primary) hypertension 03/19/2022 No Yes R60.0 Localized edema 03/19/2022 No Yes N18.31 Chronic kidney disease, stage 3a 03/19/2022 No Yes I50.30 Unspecified diastolic (congestive) heart failure 03/19/2022 No Yes Inactive Problems Resolved Problems Electronic Signature(s) Signed: 05/31/2022 1:34:08 PM By: Fredirick Maudlin MD FACS Entered By: Fredirick Maudlin on 05/31/2022 13:34:08 Mcisaac, [name] ([id number] Page 5 of 8 -------------------------------------------------------------------------------- Progress Note Details Patient Name: Date of Service: DERRY, MCCLYMONT DO RE E. 05/31/2022 12:45 PM Medical Record Number: [id number] Patient Account Number: [id number] Date of Birth/Sex: Treating RN: 11-29-1922 (89 y.o. M) Primary Care Provider: [name] Other Clinician: Referring Provider: Treating Provider/Extender: Sela Hua in Treatment: 10 Subjective Chief Complaint Information obtained from Patient Patient seen for complaints of Non-Healing Wound. History of Present Illness (HPI) ADMISSION 03/19/2022 This is a 89 year old man with a past medical history significant for coronary artery bypass with saphenous vein harvest from the right leg resulting in chronic edema, hypertension, stroke, paroxysmal atrial fibrillation on chronic anticoagulation, CHF, and stage III chronic kidney disease.. Near the beginning of November, he struck his leg on a step while getting into a bus. He developed a wound and went to urgent care but it did not seem to need any repair. It stayed open and he developed some discomfort and redness. There was some serous drainage and he presented to the emergency room on November 13. He was prescribed doxycycline and given bacitracin due to concern for infection. His PCP saw  him on November 21 and recommended moist gauze dressing changes with Xeroform. He is accompanied today by his significant other who says she has been dressing it with various topical agents including the mupirocin that was prescribed in the emergency room as well as Vaseline and a T pad. He was unable to tolerate ABIs today. elfa On his right anterior tibial surface, there is a wound that shows evidence of prior healing. There is a yellow dry eschar over the entire surface. Underneath this, there is some nonviable subcutaneous tissue and slough. He has 1+ nonpitting edema in the leg. 12/15; second visit for this 60 year old man who lives in the friend's home [place] retirement complex. His wound came from trauma after a almost fall getting on the activity bus.. Last week we used Iodoflex with 3 layer compression. He complains about pain in his right leg at night only which is relieved by either dangling his  leg over the side of the bed or standing 04/13/2022: The wound is substantially smaller and shallower from when I saw it last. There is crusted Iodoflex along with rubbery slough on the wound surface. 04/20/2022: The wound is smaller and cleaner today. Still with a fair amount of rubbery slough, however. 04/27/2022: The wound is smaller and shallower today. Much less slough accumulation. 05/04/2022: The wound continues to contract. It is fairly clean with just a little slough and eschar accumulation. Edema control is inadequate. 05/12/2022: The wound is a little bit narrower. There is slough and eschar buildup. Good granulation tissue is filling in at the base. 05/21/2022: The wound has contracted further this week. There is still some slough on the surface. There is granulation tissue at the base. 05/31/2022: The wound is smaller again today. There is eschar on the surface. Edema control is improved. Patient History Information obtained from Patient. Social History Never smoker, Marital Status - Married,  Alcohol Use - Rarely, Drug Use - No History, Caffeine Use - Daily. Medical History Cardiovascular Patient has history of Arrhythmia - Hx: RBBB; Paroxysmal atrial Fib., Hypertension Hospitalization/Surgery History - T without cardioversion ; Hernia repair;2000 cardiac Bypass. ee Medical A Surgical History Notes nd Eyes Macular degeneration in Left eye Cardiovascular Hx: CASH with CABG 2000 with LIMA to LAD,SV graft. Hx: Hyperlipidemia Hx: stroke Gastrointestinal GERD Genitourinary Hx: BPH Objective Constitutional Hypertensive, asymptomatic. Bradycardic, asymptomatic. no acute distress. MARCOS, EDWARDSON ([id number]) [phone].pdf Page 6 of 8 Vitals Time Taken: 12:45 PM, Height: 71 in, Weight: 167 lbs, BMI: 23.3, Temperature: 97.5 F, Pulse: 51 bpm, Respiratory Rate: 18 breaths/min, Blood Pressure: 175/62 mmHg. Respiratory Normal work of breathing on room air. General Notes: 05/31/2022: The wound is smaller again today. There is eschar on the surface. Edema control is improved. Integumentary (Hair, Skin) Wound #1 status is Open. Original cause of wound was Trauma. The date acquired was: 03/01/2022. The wound has been in treatment 10 weeks. The wound is located on the Right,Anterior Lower Leg. The wound measures 0.1cm length x 0.1cm width x 0.1cm depth; 0.008cm^2 area and 0.001cm^3 volume. There is Fat Layer (Subcutaneous Tissue) exposed. There is no tunneling or undermining noted. There is a medium amount of serosanguineous drainage noted. The wound margin is distinct with the outline attached to the wound base. There is small (1-33%) pink granulation within the wound bed. There is a large (67-100%) amount of necrotic tissue within the wound bed including Eschar. The periwound skin appearance had no abnormalities noted for moisture. The periwound skin appearance exhibited: Scarring, Hemosiderin Staining. Periwound temperature was noted as No  Abnormality. Assessment Active Problems ICD-10 Non-pressure chronic ulcer of other part of right lower leg with fat layer exposed Essential (primary) hypertension Localized edema Chronic kidney disease, stage 3a Unspecified diastolic (congestive) heart failure Procedures Wound #1 Pre-procedure diagnosis of Wound #1 is an Abrasion located on the Right,Anterior Lower Leg . There was a Selective/Open Wound Non-Viable Tissue Debridement with a total area of 0.01 sq cm performed by Fredirick Maudlin, MD. With the following instrument(s): Curette to remove Non-Viable tissue/material. Material removed includes Eschar after achieving pain control using Lidocaine 4% Topical Solution. No specimens were taken. A time out was conducted at 12:59, prior to the start of the procedure. A Minimum amount of bleeding was controlled with Pressure. The procedure was tolerated well with a pain level of 0 throughout and a pain level of 0 following the procedure. Post Debridement Measurements: 0.1cm length x 0.1cm width x 0.1cm depth;  0.001cm^3 volume. Character of Wound/Ulcer Post Debridement is improved. Post procedure Diagnosis Wound #1: Same as Pre-Procedure General Notes: Scribed for Dr. Celine Ahr by J.Scotton. Plan Follow-up Appointments: Return Appointment in 1 week. - Dr. Celine Ahr - Room 3 Anesthetic: (In clinic) Topical Lidocaine 5% applied to wound bed - Used in Clinic Bathing/ Shower/ Hygiene: May shower with protection but do not get wound dressing(s) wet. Protect dressing(s) with water repellant cover (for example, large plastic bag) or a cast cover and may then take shower. Edema Control - Lymphedema / SCD / Other: Avoid standing for long periods of time. - Elevate the legs throughout the day. WOUND #1: - Lower Leg Wound Laterality: Right, Anterior Peri-Wound Care: Triamcinolone 15 (g) 1 x Per Week/30 Days Discharge Instructions: Use triamcinolone 15 (g) as directed Peri-Wound Care: Sween Lotion  (Moisturizing lotion) 1 x Per Week/30 Days Discharge Instructions: Apply moisturizing lotion as directed Prim Dressing: Promogran Prisma Matrix, 4.34 (sq in) (silver collagen) 1 x Per Week/30 Days ary Discharge Instructions: Moisten collagen with saline or hydrogel Secondary Dressing: Zetuvit Plus 4x8 in 1 x Per Week/30 Days Discharge Instructions: Apply over primary dressing as directed. Com pression Wrap: Kerlix Roll 4.5x3.1 (in/yd) 1 x Per Week/30 Days Discharge Instructions: Apply Kerlix and Coban compression as directed. Com pression Wrap: Coban Self-Adherent Wrap 4x5 (in/yd) 1 x Per Week/30 Days Discharge Instructions: Apply over Kerlix as directed. Com pression Wrap: Unnaboot w/Calamine, 4x10 (in/yd) 1 x Per Week/30 Days Discharge Instructions: Apply Unnaboot at foot 05/31/2022: The wound is smaller again today. There is eschar on the surface. Edema control is improved. I used a curette to debride the eschar from his wound. We will continue Prisma silver collagen with Kerlix and Coban wrap, using the first layer of Unna boot to Velez, [name] ([id number]) [id number].pdf Page 7 of 8 minimize slippage. Follow-up in 1 week. Electronic Signature(s) Signed: 05/31/2022 1:36:55 PM By: Fredirick Maudlin MD FACS Entered By: Fredirick Maudlin on 05/31/2022 13:36:54 -------------------------------------------------------------------------------- HxROS Details Patient Name: Date of Service: [name] 05/31/2022 12:45 PM Medical Record Number: [id number] Patient Account Number: [id number] Date of Birth/Sex: Treating RN: 07/08/1922 (89 y.o. M) Primary Care Provider: [name] Other Clinician: Referring Provider: Treating Provider/Extender: Sela Hua in Treatment: 10 Information Obtained From Patient Eyes Medical History: Past Medical History Notes: Macular degeneration in Left eye Cardiovascular Medical History: Positive for:  Arrhythmia - Hx: RBBB; Paroxysmal atrial Fib.; Hypertension Past Medical History Notes: Hx: CASH with CABG 2000 with LIMA to LAD,SV graft. Hx: Hyperlipidemia Hx: stroke Gastrointestinal Medical History: Past Medical History Notes: GERD Genitourinary Medical History: Past Medical History Notes: Hx: BPH Immunizations Pneumococcal Vaccine: Received Pneumococcal Vaccination: Yes Received Pneumococcal Vaccination On or After 60th Birthday: Yes Implantable Devices None Hospitalization / Surgery History Type of Hospitalization/Surgery T without cardioversion ; Hernia repair;2000 cardiac Bypass ee Family and Social History Never smoker; Marital Status - Married; Alcohol Use: Rarely; Drug Use: No History; Caffeine Use: Daily; Financial Concerns: No; Food, Clothing or Shelter Needs: No; Support System Lacking: No; Transportation Concerns: No Electronic Signature(s) Signed: 05/31/2022 5:01:55 PM By: Fredirick Maudlin MD FACS Entered By: Fredirick Maudlin on 05/31/2022 13:35:30 Wheeless, [name] ([id number] Page 8 of 8 -------------------------------------------------------------------------------- SuperBill Details Patient Name: Date of Service: AIDENJAMES, KOUBA DO RE E. 05/31/2022 Medical Record Number: [id number] Patient Account Number: [id number] Date of Birth/Sex: Treating RN: 03-08-1923 (89 y.o. M) Primary Care Provider: [name] Other Clinician: Referring Provider: Treating Provider/Extender: Merwyn Katos, [place] Ouch in  Treatment: 10 Diagnosis Coding ICD-10 Codes Code Description [phone] Non-pressure chronic ulcer of other part of right lower leg with fat layer exposed I10 Essential (primary) hypertension R60.0 Localized edema N18.31 Chronic kidney disease, stage 3a I50.30 Unspecified diastolic (congestive) heart failure Facility Procedures : CPT4 Code: [id number] Description: [id number] - DEBRIDE WOUND 1ST 20 SQ CM OR < ICD-10  Diagnosis Description L97.812 Non-pressure chronic ulcer of other part of right lower leg with fat layer expos Modifier: ed Quantity: 1 Physician Procedures : CPT4 Code Description Modifier [id number] - WC PHYS LEVEL 3 - EST PT 25 ICD-10 Diagnosis Description L97.812 Non-pressure chronic ulcer of other part of right lower leg with fat layer exposed R60.0 Localized edema N18.31 Chronic kidney disease,  stage 3a Quantity: 1 : [id number] - WC PHYS DEBR WO ANESTH 20 SQ CM ICD-10 Diagnosis Description [id number] Non-pressure chronic ulcer of other part of right lower leg with fat layer exposed Quantity: 1 Electronic Signature(s) Signed: 05/31/2022 1:37:12 PM By: Fredirick Maudlin MD FACS Entered By: Fredirick Maudlin on 05/31/2022 13:37:11

## 2022-06-07 ENCOUNTER — Encounter (HOSPITAL_BASED_OUTPATIENT_CLINIC_OR_DEPARTMENT_OTHER): Payer: Medicare Other | Admitting: General Surgery

## 2022-06-07 DIAGNOSIS — L97812 Non-pressure chronic ulcer of other part of right lower leg with fat layer exposed: Secondary | ICD-10-CM | POA: Diagnosis not present

## 2022-06-07 DIAGNOSIS — N1831 Chronic kidney disease, stage 3a: Secondary | ICD-10-CM | POA: Diagnosis not present

## 2022-06-07 DIAGNOSIS — N189 Chronic kidney disease, unspecified: Secondary | ICD-10-CM | POA: Diagnosis not present

## 2022-06-07 DIAGNOSIS — S81811A Laceration without foreign body, right lower leg, initial encounter: Secondary | ICD-10-CM | POA: Diagnosis not present

## 2022-06-07 DIAGNOSIS — I130 Hypertensive heart and chronic kidney disease with heart failure and stage 1 through stage 4 chronic kidney disease, or unspecified chronic kidney disease: Secondary | ICD-10-CM | POA: Diagnosis not present

## 2022-06-07 DIAGNOSIS — I480 Paroxysmal atrial fibrillation: Secondary | ICD-10-CM | POA: Diagnosis not present

## 2022-06-07 DIAGNOSIS — I5030 Unspecified diastolic (congestive) heart failure: Secondary | ICD-10-CM | POA: Diagnosis not present

## 2022-06-08 NOTE — Progress Notes (Signed)
PRESTYN, MUHLE ([id number]) [id number].pdf Page 1 of 6 Visit Report for 06/07/2022 Arrival Information Details Patient Name: Date of Service: [name], [name] 06/07/2022 2:15 PM Medical Record Number: [id number] Patient Account Number: [id number] Date of Birth/Sex: Treating RN: October 20, 1922 (89 y.o. [name] Primary Care [name]: [name] Other Clinician: Referring [name]: Treating [name]/Extender: [name] in Treatment: 11 Visit Information History Since Last Visit Added or deleted any medications: No Patient Arrived: Ambulatory Any new allergies or adverse reactions: No Arrival Time: 14:28 Had a fall or experienced change in No Accompanied By: self activities of daily living that may affect Transfer Assistance: None risk of falls: Patient Has Alerts: Yes Signs or symptoms of abuse/neglect since last visito No Patient Alerts: Patient on Blood Thinner Hospitalized since last visit: No Eliquis Implantable device outside of the clinic excluding No cellular tissue based products placed in the center since last visit: Has Dressing in Place as Prescribed: Yes Has Compression in Place as Prescribed: Yes Pain Present Now: No Electronic Signature(s) Signed: 06/07/2022 4:59:48 PM By: Dellie Catholic RN Entered By: Dellie Catholic on 06/07/2022 14:31:53 -------------------------------------------------------------------------------- Encounter Discharge Information Details Patient Name: Date of Service: [name] 06/07/2022 2:15 PM Medical Record Number: [id number] Patient Account Number: [id number] Date of Birth/Sex: Treating RN: [birth year] (89 y.o. [name] Primary Care Kue Fox: [name] Other Clinician: Referring [name]: Treating [name]/Extender: [name] in Treatment: 11 Encounter Discharge Information Items Post Procedure Vitals Discharge Condition:  Stable Temperature (F): 98.7 Ambulatory Status: Ambulatory Pulse (bpm): 48 Discharge Destination: Home Respiratory Rate (breaths/min): 18 Transportation: Private Auto Blood Pressure (mmHg): 160/69 Accompanied By: self Schedule Follow-up Appointment: Yes Clinical Summary of Care: Patient Declined Electronic Signature(s) Signed: 06/07/2022 4:59:48 PM By: Dellie Catholic RN Entered By: Dellie Catholic on 06/07/2022 15:25:44 -------------------------------------------------------------------------------- Lower Extremity Assessment Details Patient Name: Date of Service: [name] 06/07/2022 2:15 PM Medical Record Number: [id number] Patient Account Number: [id number] Date of Birth/Sex: Treating RN: [birth year] (89 y.o. [name] Primary Care Valory Wetherby: [name] Other Clinician: Referring Ramsey Midgett: Treating [name]/Extender: [name] in Treatment: 11 Edema Assessment L[Left: [name], [name].pdf Page 2 of 6] Assessed: [Left: No] [Right: No] [Left: Edema] [Right: :] Calf Left: Right: Point of Measurement: From Medial Instep 35 cm Ankle Left: Right: Point of Measurement: From Medial Instep 22 cm Vascular Assessment Pulses: Dorsalis Pedis Palpable: [Right:Yes] Electronic Signature(s) Signed: 06/07/2022 4:59:48 PM By: Dellie Catholic RN Entered By: Dellie Catholic on 06/07/2022 14:35:20 -------------------------------------------------------------------------------- Multi Wound Chart Details Patient Name: Date of Service: [name] 06/07/2022 2:15 PM Medical Record Number: [id number] Patient Account Number: [id number] Date of Birth/Sex: Treating RN: 1923/03/30 (89 y.o. M) Primary Care [name]: [name] Other Clinician: Referring [name]: Treating [name]/Extender: [name] in Treatment: 11 Vital Signs Height(in): 71 Pulse(bpm):  48 Weight(lbs): 167 Blood Pressure(mmHg): 160/69 Body Mass Index(BMI): 23.3 Temperature(F): 98.7 Respiratory Rate(breaths/min): 18 [1:Photos:] [N/A:N/A] Right, Anterior Lower Leg N/A N/A Wound Location: Trauma N/A N/A Wounding Event: Abrasion N/A N/A Primary Etiology: Arrhythmia, Hypertension N/A N/A Comorbid History: 03/01/2022 N/A N/A Date Acquired: 11 N/A N/A Velez of Treatment: Open N/A N/A Wound Status: No N/A N/A Wound Recurrence: 0.1x0.1x0.1 N/A N/A Measurements L x W x D (cm) 0.008 N/A N/A A (cm) : rea 0.001 N/A N/A Volume (cm) : 99.90% N/A N/A % Reduction in A rea: 99.90% N/A N/A % Reduction in Volume: Full  Thickness Without Exposed N/A N/A Classification: Support Structures Medium N/A N/A Exudate A mount: Serosanguineous N/A N/A Exudate Type: red, brown N/A N/A Exudate Color: Distinct, outline attached N/A N/A Wound Margin: Large (67-100%) N/A N/A Granulation Amount: Red, Pink N/A N/A Granulation Quality: Small (1-33%) N/A N/A Necrotic Amount: Eschar, Adherent Slough N/A N/A Necrotic Tissue: Fat Layer (Subcutaneous Tissue): Yes N/A N/A Exposed Structures: [name], [name] ([name]) [phone].pdf Page 3 of 6 Fascia: No Tendon: No Muscle: No Joint: No Bone: No Small (1-33%) N/A N/A Epithelialization: Debridement - Selective/Open Wound N/A N/A Debridement: Pre-procedure Verification/Time Out 14:48 N/A N/A Taken: Lidocaine 5% topical ointment N/A N/A Pain Control: Necrotic/Eschar N/A N/A Tissue Debrided: Non-Viable Tissue N/A N/A Level: 0.01 N/A N/A Debridement A (sq cm): rea Curette N/A N/A Instrument: Minimum N/A N/A Bleeding: Pressure N/A N/A Hemostasis A chieved: 0 N/A N/A Procedural Pain: 0 N/A N/A Post Procedural Pain: Procedure was tolerated well N/A N/A Debridement Treatment Response: 0.1x0.1x0.1 N/A N/A Post Debridement Measurements L x W x D (cm) 0.001 N/A N/A Post Debridement  Volume: (cm) Scarring: Yes N/A N/A Periwound Skin Texture: No Abnormalities Noted N/A N/A Periwound Skin Moisture: Hemosiderin Staining: Yes N/A N/A Periwound Skin Color: No Abnormality N/A N/A Temperature: Debridement N/A N/A Procedures Performed: Treatment Notes Electronic Signature(s) Signed: 06/07/2022 3:22:41 PM By: Fredirick Maudlin MD FACS Entered By: Fredirick Maudlin on 06/07/2022 15:22:41 -------------------------------------------------------------------------------- Multi-Disciplinary Care Plan Details Patient Name: Date of Service: [name] 06/07/2022 2:15 PM Medical Record Number: [name] Patient Account Number: [id number] Date of Birth/Sex: Treating RN: November 28, 1922 (89 y.o. [name] Primary Care Jacquelene Kopecky: [name] Other Clinician: Referring Deante Blough: Treating Jamesha Ellsworth/Extender: [name] in Treatment: [place] reviewed with physician Active Inactive Pain, Acute or Chronic Nursing Diagnoses: Pain, acute or chronic: actual or potential Goals: Patient/caregiver will verbalize adequate pain control between visits Date Initiated: 03/19/2022 Target Resolution Date: 07/11/2022 Goal Status: Active Interventions: Encourage patient to take pain medications as prescribed Notes: Venous Leg Ulcer Nursing Diagnoses: Knowledge deficit related to disease process and management Potential for venous Insuffiency (use before diagnosis confirmed) Goals: Patient will maintain optimal edema control Date Initiated: 05/04/2022 Target Resolution Date: 07/11/2022 Goal Status: Active Interventions: [name], Gregg ([name]) [phone].pdf Page 4 of 6 Assess peripheral edema status every visit. Compression as ordered Treatment Activities: Therapeutic compression applied : 05/04/2022 Notes: Electronic Signature(s) Signed: 06/07/2022 4:59:48 PM By: Dellie Catholic RN Entered By: Dellie Catholic on 06/07/2022 15:24:38 -------------------------------------------------------------------------------- Pain Assessment Details Patient Name: Date of Service: [name] 06/07/2022 2:15 PM Medical Record Number: [name] Patient Account Number: [id number] Date of Birth/Sex: Treating RN: [birth year] (89 y.o. [name] Primary Care Cindra Austad: [name] Other Clinician: Referring Chealsea Paske: Treating Gabriel Paulding/Extender: [name] in Treatment: 11 Active Problems Location of Pain Severity and Description of Pain Patient Has Paino No Site Locations Pain Management and Medication Current Pain Management: Electronic Signature(s) Signed: 06/07/2022 4:59:48 PM By: Dellie Catholic RN Entered By: Dellie Catholic on 06/07/2022 14:35:10 -------------------------------------------------------------------------------- Patient/Caregiver Education Details Patient Name: Date of Service: [name] 2/26/2024andnbsp2:15 PM Medical Record Number: [name] Patient Account Number: [id number] Date of Birth/Gender: Treating RN: Jul 27, 1922 (89 y.o. [name] Primary Care Physician: [name] Other Clinician: Referring Physician: Treating Physician/Extender: [name] in Treatment: 11 Education Assessment Education Provided To: Patient [name], Gregg ([name]) [id number] Page 5 of 6 Education Topics Provided Wound/Skin Impairment: Methods: Explain/Verbal Responses: Return demonstration  correctly Electronic Signature(s) Signed: 06/07/2022 4:59:48 PM By: Dellie Catholic RN Entered By: Dellie Catholic on 06/07/2022 15:24:56 -------------------------------------------------------------------------------- Wound Assessment Details Patient Name: Date of Service: [name] 06/07/2022 2:15 PM Medical Record Number: [name] Patient Account Number: [id number] Date of  Birth/Sex: Treating RN: 12/31/1922 (89 y.o. [name] Primary Care Kelsen Celona: [name] Other Clinician: Referring Galo Sayed: Treating Marv Alfrey/Extender: [name] in Treatment: 11 Wound Status Wound Number: 1 Primary Etiology: Abrasion Wound Location: Right, Anterior Lower Leg Wound Status: Open Wounding Event: Trauma Comorbid History: Arrhythmia, Hypertension Date Acquired: 03/01/2022 Velez Of Treatment: 11 Clustered Wound: No Photos Wound Measurements Length: (cm) 0.1 Width: (cm) 0.1 Depth: (cm) 0.1 Area: (cm) 0.008 Volume: (cm) 0.001 % Reduction in Area: 99.9% % Reduction in Volume: 99.9% Epithelialization: Small (1-33%) Tunneling: No Undermining: No Wound Description Classification: Full Thickness Without Exposed Support Structures Wound Margin: Distinct, outline attached Exudate Amount: Medium Exudate Type: Serosanguineous Exudate Color: red, brown Foul Odor After Cleansing: No Slough/Fibrino Yes Wound Bed Granulation Amount: Large (67-100%) Exposed Structure Granulation Quality: Red, Pink Fascia Exposed: No Necrotic Amount: Small (1-33%) Fat Layer (Subcutaneous Tissue) Exposed: Yes Necrotic Quality: Eschar, Adherent Slough Tendon Exposed: No Muscle Exposed: No Joint Exposed: No Bone Exposed: No Periwound Skin Texture Texture Color No Abnormalities Noted: No No Abnormalities Noted: No Scarring: Yes Hemosiderin Staining: Yes Moisture Temperature / Pain [name], [name] ([name]) [phone].pdf Page 6 of 6 No Abnormalities Noted: Yes Temperature: No Abnormality Treatment Notes Wound #1 (Lower Leg) Wound Laterality: Right, Anterior Cleanser Peri-Wound Care Triamcinolone 15 (g) Discharge Instruction: Use triamcinolone 15 (g) as directed Sween Lotion (Moisturizing lotion) Discharge Instruction: Apply moisturizing lotion as directed Topical Primary Dressing Promogran Prisma Matrix, 4.34 (sq  in) (silver collagen) Discharge Instruction: Moisten collagen with saline or hydrogel Secondary Dressing Zetuvit Plus 4x8 in Discharge Instruction: Apply over primary dressing as directed. Secured With Compression Wrap Kerlix Roll 4.5x3.1 (in/yd) Discharge Instruction: Apply Kerlix and Coban compression as directed. Coban Self-Adherent Wrap 4x5 (in/yd) Discharge Instruction: Apply over Kerlix as directed. Unnaboot w/Calamine, 4x10 (in/yd) Discharge Instruction: Apply Unnaboot at foot Compression Stockings Add-Ons Electronic Signature(s) Signed: 06/07/2022 4:59:48 PM By: Dellie Catholic RN Entered By: Dellie Catholic on 06/07/2022 14:36:30 -------------------------------------------------------------------------------- Vitals Details Patient Name: Date of Service: [name] 06/07/2022 2:15 PM Medical Record Number: [name] Patient Account Number: [id number] Date of Birth/Sex: Treating RN: [birth year] (89 y.o. [name] Primary Care Shirlena Brinegar: [name] Other Clinician: Referring Addylin Manke: Treating Mahathi Pokorney/Extender: [name] in Treatment: 11 Vital Signs Time Taken: 14:35 Temperature (F): 98.7 Height (in): 71 Pulse (bpm): 48 Weight (lbs): 167 Respiratory Rate (breaths/min): 18 Body Mass Index (BMI): 23.3 Blood Pressure (mmHg): 160/69 Reference Range: 80 - 120 mg / dl Electronic Signature(s) Signed: 06/07/2022 4:59:48 PM By: Dellie Catholic RN Entered By: Dellie Catholic on 06/07/2022 14:35:03

## 2022-06-14 ENCOUNTER — Encounter (HOSPITAL_BASED_OUTPATIENT_CLINIC_OR_DEPARTMENT_OTHER): Payer: Medicare Other | Attending: General Surgery | Admitting: General Surgery

## 2022-06-14 DIAGNOSIS — I130 Hypertensive heart and chronic kidney disease with heart failure and stage 1 through stage 4 chronic kidney disease, or unspecified chronic kidney disease: Secondary | ICD-10-CM | POA: Diagnosis not present

## 2022-06-14 DIAGNOSIS — L97812 Non-pressure chronic ulcer of other part of right lower leg with fat layer exposed: Secondary | ICD-10-CM | POA: Diagnosis not present

## 2022-06-14 DIAGNOSIS — S80811A Abrasion, right lower leg, initial encounter: Secondary | ICD-10-CM | POA: Diagnosis not present

## 2022-06-14 DIAGNOSIS — I5030 Unspecified diastolic (congestive) heart failure: Secondary | ICD-10-CM | POA: Diagnosis not present

## 2022-06-14 DIAGNOSIS — Z951 Presence of aortocoronary bypass graft: Secondary | ICD-10-CM | POA: Diagnosis not present

## 2022-06-14 DIAGNOSIS — N1831 Chronic kidney disease, stage 3a: Secondary | ICD-10-CM | POA: Insufficient documentation

## 2022-06-16 NOTE — Progress Notes (Signed)
[name], [name] ([id number]) [id number] Page 1 of 5 Visit Report for 06/14/2022 Arrival Information Details Patient Name: Date of Service: Gregg, SKATES DO RE [name] 06/14/2022 1:30 PM Medical Record Number: [id number] Patient Account Number: [id number] Date of Birth/Sex: Treating RN: November 07, 1922 (89 y.o. M) Primary Care Ronson Hagins: [name] Other Clinician: Referring Shakura Cowing: Treating Luba Matzen/Extender: Sela Hua in Treatment: 12 Visit Information History Since Last Visit All ordered tests and consults were completed: No Patient Arrived: Ambulatory Added or deleted any medications: No Arrival Time: 13:36 Any new allergies or adverse reactions: No Accompanied By: wife Had a fall or experienced change in No Transfer Assistance: None activities of daily living that may affect Patient Identification Verified: Yes risk of falls: Secondary Verification Process Completed: Yes Signs or symptoms of abuse/neglect since last visito No Patient Has Alerts: Yes Hospitalized since last visit: No Patient Alerts: Patient on Blood Thinner Implantable device outside of the clinic excluding No cellular tissue based products placed in the center since last visit: Pain Present Now: No Electronic Signature(s) Signed: 06/14/2022 4:57:14 PM By: Worthy Rancher Entered By: Worthy Rancher on 06/14/2022 13:37:22 -------------------------------------------------------------------------------- Encounter Discharge Information Details Patient Name: Date of Service: Gregg Cliche DO RE [name] 06/14/2022 1:30 PM Medical Record Number: [id number] Patient Account Number: [id number] Date of Birth/Sex: Treating RN: Dec 17, 1922 (89 y.o. [name] Primary Care Denessa Cavan: [name] Other Clinician: Referring Isley Weisheit: Treating Berkeley Vanaken/Extender: Sela Hua in Treatment: 12 Encounter Discharge Information Items Post Procedure Vitals Discharge Condition:  Stable Temperature (F): 97.7 Ambulatory Status: Ambulatory Pulse (bpm): 51 Discharge Destination: Home Respiratory Rate (breaths/min): 20 Transportation: Private Auto Blood Pressure (mmHg): 160/52 Accompanied By: friend Schedule Follow-up Appointment: No Clinical Summary of Care: Electronic Signature(s) Signed: 06/14/2022 6:24:12 PM By: Dellie Catholic RN Entered By: Dellie Catholic on 06/14/2022 17:53:46 -------------------------------------------------------------------------------- Lower Extremity Assessment Details Patient Name: Date of Service: [name], MERENDA DO RE [name] 06/14/2022 1:30 PM Medical Record Number: [id number] Patient Account Number: [id number] Date of Birth/Sex: Treating RN: 08-06-1922 (89 y.o. [name] Primary Care Zyere Jiminez: [name] Other Clinician: Referring Teona Vargus: Treating Jaydn Moscato/Extender: [name] Weeks in Treatment: 12 Edema Assessment Assessed: [Left: No] [Right: No] L[Left: IDE, EFFIE KOCHANSKI [id number] [Right: [id number] Page 2 of 5] [Left: Edema] [Right: :] Calf Left: Right: Point of Measurement: From Medial Instep 35 cm Ankle Left: Right: Point of Measurement: From Medial Instep 22 cm Vascular Assessment Pulses: Dorsalis Pedis Palpable: [Right:Yes] Electronic Signature(s) Signed: 06/14/2022 6:24:12 PM By: Dellie Catholic RN Entered By: Dellie Catholic on 06/14/2022 13:58:48 -------------------------------------------------------------------------------- Multi Wound Chart Details Patient Name: Date of Service: Gregg Cliche DO RE [name] 06/14/2022 1:30 PM Medical Record Number: [id number] Patient Account Number: [id number] Date of Birth/Sex: Treating RN: 02/22/1923 (89 y.o. M) Primary Care Jessee Mezera: [name] Other Clinician: Referring Taesha Goodell: Treating Caral Whan/Extender: Sela Hua in Treatment: 12 Vital Signs Height(in): 71 Pulse(bpm): 51 Weight(lbs): 167 Blood  Pressure(mmHg): 160/52 Body Mass Index(BMI): 23.3 Temperature(F): 97.7 Respiratory Rate(breaths/min): 20 [1:Photos:] [N/A:N/A] Right, Anterior Lower Leg N/A N/A Wound Location: Trauma N/A N/A Wounding Event: Abrasion N/A N/A Primary Etiology: Arrhythmia, Hypertension N/A N/A Comorbid History: 03/01/2022 N/A N/A Date Acquired: 12 N/A N/A Weeks of Treatment: Healed - Epithelialized N/A N/A Wound Status: No N/A N/A Wound Recurrence: 0x0x0 N/A N/A Measurements L x W x D (cm) 0 N/A N/A A (cm) : rea 0 N/A N/A Volume (cm) : 100.00% N/A N/A % Reduction in A rea: 100.00% N/A N/A % Reduction in Volume: Full Thickness  Without Exposed N/A N/A Classification: Support Structures None Present N/A N/A Exudate Amount: Distinct, outline attached N/A N/A Wound Margin: None Present (0%) N/A N/A Granulation Amount: None Present (0%) N/A N/A Necrotic Amount: Fascia: No N/A N/A Exposed Structures: Fat Layer (Subcutaneous Tissue): No Tendon: No Muscle: No Joint: No Bone: No [name], [name] ([id number]) [phone].pdf Page 3 of 5 Large (67-100%) N/A N/A Epithelialization: Debridement - Selective/Open Wound N/A N/A Debridement: 13:46 N/A N/A Pre-procedure Verification/Time Out Taken: Lidocaine 4% Topical Solution N/A N/A Pain Control: Necrotic/Eschar N/A N/A Tissue Debrided: Non-Viable Tissue N/A N/A Level: 0.01 N/A N/A Debridement A (sq cm): rea Curette N/A N/A Instrument: Minimum N/A N/A Bleeding: Pressure N/A N/A Hemostasis A chieved: 0 N/A N/A Procedural Pain: 0 N/A N/A Post Procedural Pain: Procedure was tolerated well N/A N/A Debridement Treatment Response: 0.1x0.1x0.1 N/A N/A Post Debridement Measurements L x W x D (cm) 0.001 N/A N/A Post Debridement Volume: (cm) Scarring: Yes N/A N/A Periwound Skin Texture: No Abnormalities Noted N/A N/A Periwound Skin Moisture: Hemosiderin Staining: Yes N/A N/A Periwound Skin  Color: No Abnormality N/A N/A Temperature: Debridement N/A N/A Procedures Performed: Treatment Notes Electronic Signature(s) Signed: 06/14/2022 2:11:31 PM By: Fredirick Maudlin MD FACS Entered By: Fredirick Maudlin on 06/14/2022 14:11:31 -------------------------------------------------------------------------------- Multi-Disciplinary Care Plan Details Patient Name: Date of Service: Gregg Cliche DO RE [name] 06/14/2022 1:30 PM Medical Record Number: [id number] Patient Account Number: [id number] Date of Birth/Sex: Treating RN: 1922-10-26 (89 y.o. [name] Primary Care Qasim Diveley: [name] Other Clinician: Referring Eliott Amparan: Treating Elbony Mcclimans/Extender: Sela Hua in Treatment: [place] reviewed with physician Active Inactive Electronic Signature(s) Signed: 06/14/2022 6:24:12 PM By: Dellie Catholic RN Entered By: Dellie Catholic on 06/14/2022 14:10:44 -------------------------------------------------------------------------------- Pain Assessment Details Patient Name: Date of Service: Gregg Cliche DO RE [name] 06/14/2022 1:30 PM Medical Record Number: [id number] Patient Account Number: [id number] Date of Birth/Sex: Treating RN: 1922/10/10 (89 y.o. M) Primary Care Miria Cappelli: [name] Other Clinician: Referring Ollie Esty: Treating Hyde Sires/Extender: Sela Hua in Treatment: 12 Active Problems Location of Pain Severity and Description of Pain Patient Has Paino No Site Locations [name], [name] ([id number]) [id number] Page 4 of 5 Pain Management and Medication Current Pain Management: Electronic Signature(s) Signed: 06/14/2022 4:57:14 PM By: Worthy Rancher Entered By: Worthy Rancher on 06/14/2022 13:37:56 -------------------------------------------------------------------------------- Patient/Caregiver Education Details Patient Name: Date of Service: Gregg Cliche DO RE [name] 3/4/2024andnbsp1:30  PM Medical Record Number: [id number] Patient Account Number: [id number] Date of Birth/Gender: Treating RN: January 05, 1923 (89 y.o. [name] Primary Care Physician: [name] Other Clinician: Referring Physician: Treating Physician/Extender: Sela Hua in Treatment: 12 Education Assessment Education Provided To: Patient Education Topics Provided Wound/Skin Impairment: Methods: Explain/Verbal Responses: Return demonstration correctly Electronic Signature(s) Signed: 06/14/2022 6:24:12 PM By: Dellie Catholic RN Entered By: Dellie Catholic on 06/14/2022 17:49:53 -------------------------------------------------------------------------------- Wound Assessment Details Patient Name: Date of Service: Gregg Cliche DO RE [name] 06/14/2022 1:30 PM Medical Record Number: [id number] Patient Account Number: [id number] Date of Birth/Sex: Treating RN: 1923-01-13 (89 y.o. [name] Primary Care Shelia Kingsberry: [name] Other Clinician: Referring Karrah Mangini: Treating Romaine Neville/Extender: [name] Weeks in Treatment: 12 Wound Status Wound Number: 1 Primary Etiology: Abrasion Wound Location: Right, Anterior Lower Leg Wound Status: Healed - Epithelialized Wounding Event: Trauma Comorbid History: Arrhythmia, Hypertension Date Acquired: 03/01/2022 Weeks Of Treatment: 12 Clustered Wound: No MEADE, [name] ([id number]) [id number] Page 5 of 5 Photos Wound Measurements Length: (cm) Width: (cm) Depth: (cm) Area: (cm) Volume: (cm) 0 %  Reduction in Area: 100% 0 % Reduction in Volume: 100% 0 Epithelialization: Large (67-100%) 0 Tunneling: No 0 Undermining: No Wound Description Classification: Full Thickness Without Exposed Support Structures Wound Margin: Distinct, outline attached Exudate Amount: None Present Foul Odor After Cleansing: No Slough/Fibrino No Wound Bed Granulation Amount: None Present (0%) Exposed  Structure Necrotic Amount: None Present (0%) Fascia Exposed: No Fat Layer (Subcutaneous Tissue) Exposed: No Tendon Exposed: No Muscle Exposed: No Joint Exposed: No Bone Exposed: No Periwound Skin Texture Texture Color No Abnormalities Noted: Yes No Abnormalities Noted: Yes Moisture Temperature / Pain No Abnormalities Noted: Yes Temperature: No Abnormality Electronic Signature(s) Signed: 06/14/2022 6:24:12 PM By: Dellie Catholic RN Entered By: Dellie Catholic on 06/14/2022 14:01:55 -------------------------------------------------------------------------------- Vitals Details Patient Name: Date of Service: Gregg Cliche DO RE [name] 06/14/2022 1:30 PM Medical Record Number: [id number] Patient Account Number: [id number] Date of Birth/Sex: Treating RN: 1922/10/17 (89 y.o. M) Primary Care Chloe Miyoshi: [name] Other Clinician: Referring Lakisa Lotz: Treating Dwayne Begay/Extender: Sela Hua in Treatment: 12 Vital Signs Time Taken: 01:37 Temperature (F): 97.7 Height (in): 71 Pulse (bpm): 51 Weight (lbs): 167 Respiratory Rate (breaths/min): 20 Body Mass Index (BMI): 23.3 Blood Pressure (mmHg): 160/52 Reference Range: 80 - 120 mg / dl Electronic Signature(s) Signed: 06/14/2022 4:57:14 PM By: Worthy Rancher Entered By: Worthy Rancher on 06/14/2022 13:37:49

## 2022-06-16 NOTE — Progress Notes (Addendum)
KNIGHTLY, MARKELL ([id number]) [id number] Page 1 of 7 Visit Report for 06/14/2022 Chief Complaint Document Details Patient Name: Date of Service: [name], GILLUM DO RE E. 06/14/2022 1:30 PM Medical Record Number: [id number] Patient Account Number: [id number] Date of Birth/Sex: Treating RN: 01-Sep-1922 (89 y.o. M) Primary Care Provider: [name] Other Clinician: Referring Provider: Treating Provider/Extender: Sela Hua in Treatment: 12 Information Obtained from: Patient Chief Complaint Patient seen for complaints of Non-Healing Wound. Electronic Signature(s) Signed: 06/14/2022 2:11:38 PM By: Fredirick Maudlin MD FACS Entered By: Fredirick Maudlin on 06/14/2022 14:11:37 -------------------------------------------------------------------------------- Debridement Details Patient Name: Date of Service: Gregg Cliche DO RE E. 06/14/2022 1:30 PM Medical Record Number: [id number] Patient Account Number: [id number] Date of Birth/Sex: Treating RN: 1923-03-28 (89 y.o. [name] Primary Care Provider: [name] Other Clinician: Referring Provider: Treating Provider/Extender: Sela Hua in Treatment: 12 Debridement Performed for Assessment: Wound #1 Right,Anterior Lower Leg Performed By: Physician Fredirick Maudlin, MD Debridement Type: Debridement Level of Consciousness (Pre-procedure): Awake and Alert Pre-procedure Verification/Time Out Yes - 13:46 Taken: Start Time: 13:46 Pain Control: Lidocaine 4% T opical Solution T Area Debrided (L x W): otal 0.1 (cm) x 0.1 (cm) = 0.01 (cm) Tissue and other material debrided: Non-Viable, Eschar Level: Non-Viable Tissue Debridement Description: Selective/Open Wound Instrument: Curette Bleeding: Minimum Hemostasis Achieved: Pressure End Time: 13:47 Procedural Pain: 0 Post Procedural Pain: 0 Response to Treatment: Procedure was tolerated well Level of Consciousness (Post-  Awake and Alert procedure): Post Debridement Measurements of Total Wound Length: (cm) 0.1 Width: (cm) 0.1 Depth: (cm) 0.1 Volume: (cm) 0.001 Character of Wound/Ulcer Post Debridement: Improved Post Procedure Diagnosis Same as Pre-procedure Notes Scribed for Dr. Celine Ahr by J.Scotton Electronic Signature(s) Signed: 06/14/2022 5:08:25 PM By: Fredirick Maudlin MD FACS Signed: 06/14/2022 6:24:12 PM By: Dellie Catholic RN OMAR, LICON E ([id number]) [id number] Page 2 of 7 Entered By: Dellie Catholic on 06/14/2022 14:00:35 -------------------------------------------------------------------------------- HPI Details Patient Name: Date of Service: [name], [name] 06/14/2022 1:30 PM Medical Record Number: [id number] Patient Account Number: [id number] Date of Birth/Sex: Treating RN: [birth year] (89 y.o. M) Primary Care Provider: [name] Other Clinician: Referring Provider: Treating Provider/Extender: Sela Hua in Treatment: 12 History of Present Illness HPI Description: ADMISSION 03/19/2022 This is a 89 year old man with a past medical history significant for coronary artery bypass with saphenous vein harvest from the right leg resulting in chronic edema, hypertension, stroke, paroxysmal atrial fibrillation on chronic anticoagulation, CHF, and stage III chronic kidney disease.. Near the beginning of November, he struck his leg on a step while getting into a bus. He developed a wound and went to urgent care but it did not seem to need any repair. It stayed open and he developed some discomfort and redness. There was some serous drainage and he presented to the emergency room on November 13. He was prescribed doxycycline and given bacitracin due to concern for infection. His PCP saw him on November 21 and recommended moist gauze dressing changes with Xeroform. He is accompanied today by his significant other who says she has been dressing it  with various topical agents including the mupirocin that was prescribed in the emergency room as well as Vaseline and a T pad. He was unable to tolerate ABIs today. elfa On his right anterior tibial surface, there is a wound that shows evidence of prior healing. There is a yellow dry eschar over the entire surface. Underneath this, there is some nonviable subcutaneous tissue and slough. He has  1+ nonpitting edema in the leg. 12/15; second visit for this 75 year old man who lives in the friend's home [place] [facility] complex. His wound came from trauma after a almost fall getting on the activity bus.. Last week we used Iodoflex with 3 layer compression. He complains about pain in his right leg at night only which is relieved by either dangling his leg over the side of the bed or standing 04/13/2022: The wound is substantially smaller and shallower from when I saw it last. There is crusted Iodoflex along with rubbery slough on the wound surface. 04/20/2022: The wound is smaller and cleaner today. Still with a fair amount of rubbery slough, however. 04/27/2022: The wound is smaller and shallower today. Much less slough accumulation. 05/04/2022: The wound continues to contract. It is fairly clean with just a little slough and eschar accumulation. Edema control is inadequate. 05/12/2022: The wound is a little bit narrower. There is slough and eschar buildup. Good granulation tissue is filling in at the base. 05/21/2022: The wound has contracted further this week. There is still some slough on the surface. There is granulation tissue at the base. 05/31/2022: The wound is smaller again today. There is eschar on the surface. Edema control is improved. 06/07/2022: The wound is very nearly closed. There is just a small open area remaining underneath some eschar. 06/14/2022: The wound is essentially healed under some eschar. Electronic Signature(s) Signed: 06/14/2022 2:12:02 PM By: Fredirick Maudlin MD FACS Entered By:  Fredirick Maudlin on 06/14/2022 14:12:02 -------------------------------------------------------------------------------- Physical Exam Details Patient Name: Date of Service: Gregg Cliche DO RE E. 06/14/2022 1:30 PM Medical Record Number: [id number] Patient Account Number: [id number] Date of Birth/Sex: Treating RN: 1923/02/02 (89 y.o. M) Primary Care Provider: [name] Other Clinician: Referring Provider: Treating Provider/Extender: Sela Hua in Treatment: 12 Constitutional Hypertensive, asymptomatic. Bradycardic, asymptomatic. . . no acute distress. Respiratory Normal work of breathing on room air. Notes 06/14/2022: The wound is essentially healed and there is thin layer of eschar. Electronic Signature(s) RYUN, RODENBAUGH ([id number]) [id number] Page 3 of 7 Signed: 06/14/2022 2:14:29 PM By: Fredirick Maudlin MD FACS Entered By: Fredirick Maudlin on 06/14/2022 14:14:29 -------------------------------------------------------------------------------- Physician Orders Details Patient Name: Date of Service: Gregg Cliche DO RE E. 06/14/2022 1:30 PM Medical Record Number: [id number] Patient Account Number: [id number] Date of Birth/Sex: Treating RN: [birth year] (89 y.o. [name] Primary Care Provider: [name] Other Clinician: Referring Provider: Treating Provider/Extender: Sela Hua in Treatment: 12 Verbal / Phone Orders: No Diagnosis Coding ICD-10 Coding Code Description [phone] Non-pressure chronic ulcer of other part of right lower leg with fat layer exposed I10 Essential (primary) hypertension R60.0 Localized edema N18.31 Chronic kidney disease, stage 3a I50.30 Unspecified diastolic (congestive) heart failure Discharge From [facility] Discharge from [place] your wound is healed! Electronic Signature(s) Signed: 06/15/2022 8:07:52 AM By: Fredirick Maudlin MD FACS Previous  Signature: 06/14/2022 2:14:40 PM Version By: Fredirick Maudlin MD FACS Entered By: Fredirick Maudlin on 06/15/2022 08:07:51 -------------------------------------------------------------------------------- Problem List Details Patient Name: Date of Service: Gregg Cliche DO RE E. 06/14/2022 1:30 PM Medical Record Number: [id number] Patient Account Number: [id number] Date of Birth/Sex: Treating RN: June 07, 1922 (89 y.o. M) Primary Care Provider: [name] Other Clinician: Referring Provider: Treating Provider/Extender: Sela Hua in Treatment: 12 Active Problems ICD-10 Encounter Code Description Active Date MDM Diagnosis [phone] Non-pressure chronic ulcer of other part of right lower leg with fat layer 03/19/2022 No Yes exposed [place] (primary) hypertension  03/19/2022 No Yes R60.0 Localized edema 03/19/2022 No Yes N18.31 Chronic kidney disease, stage 3a 03/19/2022 No Yes I50.30 Unspecified diastolic (congestive) heart failure 03/19/2022 No Yes MESIAH, MACAK ([id number]) [id number] Page 4 of 7 Inactive Problems Resolved Problems Electronic Signature(s) Signed: 06/14/2022 2:09:36 PM By: Fredirick Maudlin MD FACS Entered By: Fredirick Maudlin on 06/14/2022 14:09:36 -------------------------------------------------------------------------------- Progress Note Details Patient Name: Date of Service: Gregg Cliche DO RE E. 06/14/2022 1:30 PM Medical Record Number: [id number] Patient Account Number: [id number] Date of Birth/Sex: Treating RN: [birth year] (89 y.o. M) Primary Care Provider: [name] Other Clinician: Referring Provider: Treating Provider/Extender: Sela Hua in Treatment: 12 Subjective Chief Complaint Information obtained from Patient Patient seen for complaints of Non-Healing Wound. History of Present Illness (HPI) ADMISSION 03/19/2022 This is a 89 year old man with a past medical history significant for  coronary artery bypass with saphenous vein harvest from the right leg resulting in chronic edema, hypertension, stroke, paroxysmal atrial fibrillation on chronic anticoagulation, CHF, and stage III chronic kidney disease.. Near the beginning of November, he struck his leg on a step while getting into a bus. He developed a wound and went to urgent care but it did not seem to need any repair. It stayed open and he developed some discomfort and redness. There was some serous drainage and he presented to the emergency room on November 13. He was prescribed doxycycline and given bacitracin due to concern for infection. His PCP saw him on November 21 and recommended moist gauze dressing changes with Xeroform. He is accompanied today by his significant other who says she has been dressing it with various topical agents including the mupirocin that was prescribed in the emergency room as well as Vaseline and a T pad. He was unable to tolerate ABIs today. elfa On his right anterior tibial surface, there is a wound that shows evidence of prior healing. There is a yellow dry eschar over the entire surface. Underneath this, there is some nonviable subcutaneous tissue and slough. He has 1+ nonpitting edema in the leg. 12/15; second visit for this 89 year old man who lives in the friend's home [place] complex. His wound came from trauma after a almost fall getting on the activity bus.. Last week we used Iodoflex with 3 layer compression. He complains about pain in his right leg at night only which is relieved by either dangling his leg over the side of the bed or standing 04/13/2022: The wound is substantially smaller and shallower from when I saw it last. There is crusted Iodoflex along with rubbery slough on the wound surface. 04/20/2022: The wound is smaller and cleaner today. Still with a fair amount of rubbery slough, however. 04/27/2022: The wound is smaller and shallower today. Much less slough  accumulation. 05/04/2022: The wound continues to contract. It is fairly clean with just a little slough and eschar accumulation. Edema control is inadequate. 05/12/2022: The wound is a little bit narrower. There is slough and eschar buildup. Good granulation tissue is filling in at the base. 05/21/2022: The wound has contracted further this week. There is still some slough on the surface. There is granulation tissue at the base. 05/31/2022: The wound is smaller again today. There is eschar on the surface. Edema control is improved. 06/07/2022: The wound is very nearly closed. There is just a small open area remaining underneath some eschar. 06/14/2022: The wound is essentially healed under some eschar. Patient History Information obtained from Patient. Social History Never smoker, Marital Status - Married, Alcohol  Use - Rarely, Drug Use - No History, Caffeine Use - Daily. Medical History Cardiovascular Patient has history of Arrhythmia - Hx: RBBB; Paroxysmal atrial Fib., Hypertension Hospitalization/Surgery History - T without cardioversion ; Hernia repair;2000 cardiac Bypass. ee Medical A Surgical History Notes nd Eyes Macular degeneration in Left eye Cardiovascular Hx: CASH with CABG 2000 with LIMA to LAD,SV graft. Hx: Hyperlipidemia Hx: stroke Gastrointestinal GERD Genitourinary YNES, [name] ([id number]) [id number] Page 5 of 7 Hx: BPH Objective Constitutional Hypertensive, asymptomatic. Bradycardic, asymptomatic. no acute distress. Vitals Time Taken: 1:37 AM, Height: 71 in, Weight: 167 lbs, BMI: 23.3, Temperature: 97.7 F, Pulse: 51 bpm, Respiratory Rate: 20 breaths/min, Blood Pressure: 160/52 mmHg. Respiratory Normal work of breathing on room air. General Notes: 06/14/2022: The wound is essentially healed and there is thin layer of eschar. Integumentary (Hair, Skin) Wound #1 status is Healed - Epithelialized. Original cause of wound was Trauma. The date  acquired was: 03/01/2022. The wound has been in treatment 12 weeks. The wound is located on the Right,Anterior Lower Leg. The wound measures 0cm length x 0cm width x 0cm depth; 0cm^2 area and 0cm^3 volume. There is no tunneling or undermining noted. There is a none present amount of drainage noted. The wound margin is distinct with the outline attached to the wound base. There is no granulation within the wound bed. There is no necrotic tissue within the wound bed. The periwound skin appearance had no abnormalities noted for texture. The periwound skin appearance had no abnormalities noted for moisture. The periwound skin appearance had no abnormalities noted for color. Periwound temperature was noted as No Abnormality. Assessment Active Problems ICD-10 Non-pressure chronic ulcer of other part of right lower leg with fat layer exposed Essential (primary) hypertension Localized edema Chronic kidney disease, stage 3a Unspecified diastolic (congestive) heart failure Procedures Wound #1 Pre-procedure diagnosis of Wound #1 is an Abrasion located on the Right,Anterior Lower Leg . There was a Selective/Open Wound Non-Viable Tissue Debridement with a total area of 0.01 sq cm performed by Fredirick Maudlin, MD. With the following instrument(s): Curette to remove Non-Viable tissue/material. Material removed includes Eschar after achieving pain control using Lidocaine 4% Topical Solution. No specimens were taken. A time out was conducted at 13:46, prior to the start of the procedure. A Minimum amount of bleeding was controlled with Pressure. The procedure was tolerated well with a pain level of 0 throughout and a pain level of 0 following the procedure. Post Debridement Measurements: 0.1cm length x 0.1cm width x 0.1cm depth; 0.001cm^3 volume. Character of Wound/Ulcer Post Debridement is improved. Post procedure Diagnosis Wound #1: Same as Pre-Procedure General Notes: Scribed for Dr. Celine Ahr by  J.Scotton. Plan [facility]charge From [facility]: Discharge from [place] your wound is healed! 06/14/2022: The wound is essentially healed under a layer of eschar. I used a curette to debride the eschar. We will discharge him from the wound care center. He has been recommended the use of compression stockings. He may follow-up as needed. Electronic Signature(s) Signed: 06/15/2022 8:08:04 AM By: Fredirick Maudlin MD FACS Previous Signature: 06/14/2022 2:15:21 PM Version By: Fredirick Maudlin MD FACS LACHLAN, SPEIR ([id number]) [id number] Page 6 of 7 Entered By: Fredirick Maudlin on 06/15/2022 08:08:03 -------------------------------------------------------------------------------- HxROS Details Patient Name: Date of Service: [name], ARNWINE DO RE E. 06/14/2022 1:30 PM Medical Record Number: [id number] Patient Account Number: [id number] Date of Birth/Sex: Treating RN: 1923/03/20 (89 y.o. M) Primary Care Provider: [name] Other Clinician: Referring Provider: Treating Provider/Extender: Merwyn Katos, Joellen [place]  Weeks in Treatment: 12 Information Obtained From Patient Eyes Medical History: Past Medical History Notes: Macular degeneration in Left eye Cardiovascular Medical History: Positive for: Arrhythmia - Hx: RBBB; Paroxysmal atrial Fib.; Hypertension Past Medical History Notes: Hx: CASH with CABG 2000 with LIMA to LAD,SV graft. Hx: Hyperlipidemia Hx: stroke Gastrointestinal Medical History: Past Medical History Notes: GERD Genitourinary Medical History: Past Medical History Notes: Hx: BPH Immunizations Pneumococcal Vaccine: Received Pneumococcal Vaccination: Yes Received Pneumococcal Vaccination On or After 60th Birthday: Yes Implantable Devices None Hospitalization / Surgery History Type of Hospitalization/Surgery T without cardioversion ; Hernia repair;2000 cardiac Bypass ee Family and Social History Never smoker; Marital  Status - Married; Alcohol Use: Rarely; Drug Use: No History; Caffeine Use: Daily; Financial Concerns: No; Food, Clothing or Shelter Needs: No; Support System Lacking: No; Transportation Concerns: No Electronic Signature(s) Signed: 06/14/2022 5:08:25 PM By: Fredirick Maudlin MD FACS Entered By: Fredirick Maudlin on 06/14/2022 14:12:08 -------------------------------------------------------------------------------- SuperBill Details Patient Name: Date of Service: Gregg Cliche DO RE E. 06/14/2022 Medical Record Number: [id number] Patient Account Number: [id number] Date of Birth/Sex: Treating RN: 1922-10-11 (89 y.o. M) Primary Care Provider: [name] Other Clinician: Referring Provider: Treating Provider/Extender: Sela Hua in Treatment: [address], [place] E ([id number]) [id number] Page 7 of 7 Diagnosis Coding ICD-10 Codes Code Description [phone] Non-pressure chronic ulcer of other part of right lower leg with fat layer exposed I10 Essential (primary) hypertension R60.0 Localized edema N18.31 Chronic kidney disease, stage 3a I50.30 Unspecified diastolic (congestive) heart failure Facility Procedures : CPT4 Code: [id number] Description: [id number] - DEBRIDE WOUND 1ST 20 SQ CM OR < ICD-10 Diagnosis Description L97.812 Non-pressure chronic ulcer of other part of right lower leg with fat layer expos Modifier: ed Quantity: 1 Physician Procedures : CPT4 Code Description Modifier [id number] - WC PHYS LEVEL 3 - EST PT 25 ICD-10 Diagnosis Description L97.812 Non-pressure chronic ulcer of other part of right lower leg with fat layer exposed R60.0 Localized edema N18.31 Chronic kidney disease,  stage 3a I50.30 Unspecified diastolic (congestive) heart failure Quantity: 1 : [id number] - WC PHYS DEBR WO ANESTH 20 SQ CM ICD-10 Diagnosis Description L97.812 Non-pressure chronic ulcer of other part of right lower leg with fat layer exposed Quantity:  1 Electronic Signature(s) Signed: 06/15/2022 8:08:12 AM By: Fredirick Maudlin MD FACS Previous Signature: 06/14/2022 2:15:45 PM Version By: Fredirick Maudlin MD FACS Entered By: Fredirick Maudlin on 06/15/2022 08:08:12

## 2022-06-19 ENCOUNTER — Inpatient Hospital Stay (HOSPITAL_COMMUNITY)
Admission: EM | Admit: 2022-06-19 | Discharge: 2022-06-24 | DRG: 291 | Disposition: A | Discharge status: Home / Self Care | Payer: Medicare Other | Attending: Internal Medicine | Admitting: Internal Medicine

## 2022-06-19 ENCOUNTER — Other Ambulatory Visit: Payer: Self-pay

## 2022-06-19 ENCOUNTER — Emergency Department (HOSPITAL_COMMUNITY): Payer: Medicare Other

## 2022-06-19 ENCOUNTER — Inpatient Hospital Stay (HOSPITAL_COMMUNITY): Payer: Medicare Other

## 2022-06-19 ENCOUNTER — Encounter (HOSPITAL_COMMUNITY): Payer: Self-pay

## 2022-06-19 DIAGNOSIS — H409 Unspecified glaucoma: Secondary | ICD-10-CM | POA: Diagnosis present

## 2022-06-19 DIAGNOSIS — I10 Essential (primary) hypertension: Secondary | ICD-10-CM | POA: Diagnosis present

## 2022-06-19 DIAGNOSIS — R319 Hematuria, unspecified: Secondary | ICD-10-CM

## 2022-06-19 DIAGNOSIS — I4510 Unspecified right bundle-branch block: Secondary | ICD-10-CM | POA: Diagnosis present

## 2022-06-19 DIAGNOSIS — R079 Chest pain, unspecified: Secondary | ICD-10-CM | POA: Diagnosis not present

## 2022-06-19 DIAGNOSIS — I361 Nonrheumatic tricuspid (valve) insufficiency: Secondary | ICD-10-CM | POA: Diagnosis present

## 2022-06-19 DIAGNOSIS — E785 Hyperlipidemia, unspecified: Secondary | ICD-10-CM | POA: Diagnosis present

## 2022-06-19 DIAGNOSIS — N400 Enlarged prostate without lower urinary tract symptoms: Secondary | ICD-10-CM | POA: Diagnosis present

## 2022-06-19 DIAGNOSIS — D500 Iron deficiency anemia secondary to blood loss (chronic): Secondary | ICD-10-CM | POA: Insufficient documentation

## 2022-06-19 DIAGNOSIS — R310 Gross hematuria: Secondary | ICD-10-CM

## 2022-06-19 DIAGNOSIS — I2489 Other forms of acute ischemic heart disease: Secondary | ICD-10-CM | POA: Diagnosis present

## 2022-06-19 DIAGNOSIS — I480 Paroxysmal atrial fibrillation: Secondary | ICD-10-CM | POA: Diagnosis present

## 2022-06-19 DIAGNOSIS — Z888 Allergy status to other drugs, medicaments and biological substances status: Secondary | ICD-10-CM

## 2022-06-19 DIAGNOSIS — E876 Hypokalemia: Secondary | ICD-10-CM | POA: Diagnosis not present

## 2022-06-19 DIAGNOSIS — K219 Gastro-esophageal reflux disease without esophagitis: Secondary | ICD-10-CM | POA: Diagnosis present

## 2022-06-19 DIAGNOSIS — I5033 Acute on chronic diastolic (congestive) heart failure: Secondary | ICD-10-CM | POA: Diagnosis present

## 2022-06-19 DIAGNOSIS — R54 Age-related physical debility: Secondary | ICD-10-CM | POA: Diagnosis present

## 2022-06-19 DIAGNOSIS — R001 Bradycardia, unspecified: Secondary | ICD-10-CM | POA: Diagnosis present

## 2022-06-19 DIAGNOSIS — N1832 Chronic kidney disease, stage 3b: Secondary | ICD-10-CM | POA: Diagnosis present

## 2022-06-19 DIAGNOSIS — N179 Acute kidney failure, unspecified: Secondary | ICD-10-CM | POA: Diagnosis not present

## 2022-06-19 DIAGNOSIS — I959 Hypotension, unspecified: Secondary | ICD-10-CM | POA: Diagnosis not present

## 2022-06-19 DIAGNOSIS — I4821 Permanent atrial fibrillation: Secondary | ICD-10-CM | POA: Diagnosis not present

## 2022-06-19 DIAGNOSIS — N3091 Cystitis, unspecified with hematuria: Secondary | ICD-10-CM | POA: Diagnosis not present

## 2022-06-19 DIAGNOSIS — I25708 Atherosclerosis of coronary artery bypass graft(s), unspecified, with other forms of angina pectoris: Secondary | ICD-10-CM | POA: Diagnosis not present

## 2022-06-19 DIAGNOSIS — N189 Chronic kidney disease, unspecified: Secondary | ICD-10-CM | POA: Diagnosis present

## 2022-06-19 DIAGNOSIS — Z8249 Family history of ischemic heart disease and other diseases of the circulatory system: Secondary | ICD-10-CM

## 2022-06-19 DIAGNOSIS — D62 Acute posthemorrhagic anemia: Secondary | ICD-10-CM | POA: Diagnosis present

## 2022-06-19 DIAGNOSIS — I5031 Acute diastolic (congestive) heart failure: Secondary | ICD-10-CM | POA: Diagnosis not present

## 2022-06-19 DIAGNOSIS — J9811 Atelectasis: Secondary | ICD-10-CM | POA: Diagnosis present

## 2022-06-19 DIAGNOSIS — Z87440 Personal history of urinary (tract) infections: Secondary | ICD-10-CM

## 2022-06-19 DIAGNOSIS — I25119 Atherosclerotic heart disease of native coronary artery with unspecified angina pectoris: Secondary | ICD-10-CM | POA: Diagnosis present

## 2022-06-19 DIAGNOSIS — I509 Heart failure, unspecified: Secondary | ICD-10-CM | POA: Insufficient documentation

## 2022-06-19 DIAGNOSIS — I25810 Atherosclerosis of coronary artery bypass graft(s) without angina pectoris: Secondary | ICD-10-CM | POA: Diagnosis present

## 2022-06-19 DIAGNOSIS — R0600 Dyspnea, unspecified: Secondary | ICD-10-CM | POA: Diagnosis not present

## 2022-06-19 DIAGNOSIS — I272 Pulmonary hypertension, unspecified: Secondary | ICD-10-CM | POA: Diagnosis present

## 2022-06-19 DIAGNOSIS — Z79899 Other long term (current) drug therapy: Secondary | ICD-10-CM

## 2022-06-19 DIAGNOSIS — I130 Hypertensive heart and chronic kidney disease with heart failure and stage 1 through stage 4 chronic kidney disease, or unspecified chronic kidney disease: Secondary | ICD-10-CM | POA: Diagnosis not present

## 2022-06-19 DIAGNOSIS — Z8673 Personal history of transient ischemic attack (TIA), and cerebral infarction without residual deficits: Secondary | ICD-10-CM | POA: Diagnosis not present

## 2022-06-19 DIAGNOSIS — Z7901 Long term (current) use of anticoagulants: Secondary | ICD-10-CM

## 2022-06-19 DIAGNOSIS — R0789 Other chest pain: Secondary | ICD-10-CM | POA: Diagnosis not present

## 2022-06-19 DIAGNOSIS — R12 Heartburn: Secondary | ICD-10-CM | POA: Diagnosis not present

## 2022-06-19 DIAGNOSIS — Z9079 Acquired absence of other genital organ(s): Secondary | ICD-10-CM

## 2022-06-19 LAB — CBC
HCT: 24.6 % — ABNORMAL LOW (ref 39.0–52.0)
Hemoglobin: 8.1 g/dL — ABNORMAL LOW (ref 13.0–17.0)
MCH: 30.3 pg (ref 26.0–34.0)
MCHC: 32.9 g/dL (ref 30.0–36.0)
MCV: 92.1 fL (ref 80.0–100.0)
Platelets: 258 10*3/uL (ref 150–400)
RBC: 2.67 MIL/uL — ABNORMAL LOW (ref 4.22–5.81)
RDW: 17.2 % — ABNORMAL HIGH (ref 11.5–15.5)
WBC: 7 10*3/uL (ref 4.0–10.5)
nRBC: 0 % (ref 0.0–0.2)

## 2022-06-19 LAB — BASIC METABOLIC PANEL
Anion gap: 9 (ref 5–15)
BUN: 53 mg/dL — ABNORMAL HIGH (ref 8–23)
CO2: 21 mmol/L — ABNORMAL LOW (ref 22–32)
Calcium: 8.7 mg/dL — ABNORMAL LOW (ref 8.9–10.3)
Chloride: 112 mmol/L — ABNORMAL HIGH (ref 98–111)
Creatinine, Ser: 1.82 mg/dL — ABNORMAL HIGH (ref 0.61–1.24)
GFR, Estimated: 33 mL/min — ABNORMAL LOW (ref >60)
Glucose, Bld: 130 mg/dL — ABNORMAL HIGH (ref 70–99)
Potassium: 4 mmol/L (ref 3.5–5.1)
Sodium: 142 mmol/L (ref 135–145)

## 2022-06-19 LAB — URINALYSIS, MICROSCOPIC (REFLEX): RBC / HPF: >50 RBC/hpf (ref 0–5)

## 2022-06-19 LAB — TROPONIN I (HIGH SENSITIVITY)
Troponin I (High Sensitivity): 162 ng/L (ref <18)
Troponin I (High Sensitivity): 98 ng/L — ABNORMAL HIGH (ref <18)

## 2022-06-19 LAB — PREPARE RBC (CROSSMATCH)

## 2022-06-19 LAB — URINALYSIS, ROUTINE W REFLEX MICROSCOPIC

## 2022-06-19 LAB — BRAIN NATRIURETIC PEPTIDE: B Natriuretic Peptide: 786.5 pg/mL — ABNORMAL HIGH (ref 0.0–100.0)

## 2022-06-19 LAB — PROTIME-INR
INR: 1.2 (ref 0.8–1.2)
Prothrombin Time: 15.3 seconds — ABNORMAL HIGH (ref 11.4–15.2)

## 2022-06-19 LAB — ABO/RH: ABO/RH(D): O POS

## 2022-06-19 MED ORDER — SODIUM CHLORIDE 0.9% IV SOLUTION: Freq: Once | INTRAVENOUS | Status: AC

## 2022-06-19 MED ORDER — HYDRALAZINE HCL 10 MG PO TABS
10.0000 mg | ORAL_TABLET | Freq: Three times a day (TID) | ORAL | Status: DC
  Administered 2022-06-19 – 2022-06-20 (×3): 10 mg via ORAL
  Filled 2022-06-19 (×3): qty 1

## 2022-06-19 MED ORDER — FERROUS SULFATE 325 (65 FE) MG PO TABS
325.0000 mg | ORAL_TABLET | Freq: Every day | ORAL | Status: DC
  Administered 2022-06-20 – 2022-06-22 (×3): 325 mg via ORAL
  Filled 2022-06-19 (×4): qty 1

## 2022-06-19 MED ORDER — FUROSEMIDE 10 MG/ML IJ SOLN
20.0000 mg | Freq: Two times a day (BID) | INTRAMUSCULAR | Status: DC
  Administered 2022-06-19 – 2022-06-20 (×2): 20 mg via INTRAVENOUS
  Filled 2022-06-19 (×3): qty 2

## 2022-06-19 MED ORDER — ATORVASTATIN CALCIUM 40 MG PO TABS
40.0000 mg | ORAL_TABLET | Freq: Every day | ORAL | Status: DC
  Administered 2022-06-19 – 2022-06-23 (×5): 40 mg via ORAL
  Filled 2022-06-19 (×5): qty 1

## 2022-06-19 MED ORDER — ISOSORBIDE MONONITRATE ER 30 MG PO TB24
30.0000 mg | ORAL_TABLET | Freq: Every day | ORAL | Status: DC
  Administered 2022-06-19 – 2022-06-22 (×4): 30 mg via ORAL
  Filled 2022-06-19 (×4): qty 1

## 2022-06-19 MED ORDER — FINASTERIDE 5 MG PO TABS
5.0000 mg | ORAL_TABLET | Freq: Every day | ORAL | Status: DC
  Administered 2022-06-19 – 2022-06-24 (×6): 5 mg via ORAL
  Filled 2022-06-19 (×6): qty 1

## 2022-06-19 MED ORDER — TRAMADOL HCL 50 MG PO TABS
50.0000 mg | ORAL_TABLET | Freq: Four times a day (QID) | ORAL | Status: DC | PRN
  Administered 2022-06-19 – 2022-06-20 (×2): 50 mg via ORAL
  Filled 2022-06-19 (×2): qty 1

## 2022-06-19 MED ORDER — TIMOLOL MALEATE 0.5 % OP SOLN
1.0000 [drp] | Freq: Two times a day (BID) | OPHTHALMIC | Status: DC
  Administered 2022-06-19 – 2022-06-20 (×2): 1 [drp] via OPHTHALMIC
  Filled 2022-06-19: qty 5

## 2022-06-19 MED ORDER — HYDRALAZINE HCL 20 MG/ML IJ SOLN: 2.0000 mg | Freq: Four times a day (QID) | INTRAMUSCULAR | Status: DC | PRN

## 2022-06-19 MED ORDER — EZETIMIBE 10 MG PO TABS
5.0000 mg | ORAL_TABLET | Freq: Every day | ORAL | Status: DC
  Administered 2022-06-19 – 2022-06-23 (×5): 5 mg via ORAL
  Filled 2022-06-19 (×5): qty 1

## 2022-06-19 MED ORDER — LATANOPROST 0.005 % OP SOLN
1.0000 [drp] | Freq: Every day | OPHTHALMIC | Status: DC
  Administered 2022-06-19 – 2022-06-23 (×5): 1 [drp] via OPHTHALMIC
  Filled 2022-06-19: qty 2.5

## 2022-06-19 MED ORDER — AMLODIPINE BESYLATE 10 MG PO TABS
10.0000 mg | ORAL_TABLET | Freq: Every day | ORAL | Status: DC
  Administered 2022-06-19 – 2022-06-24 (×6): 10 mg via ORAL
  Filled 2022-06-19 (×4): qty 1
  Filled 2022-06-19: qty 2
  Filled 2022-06-19: qty 1

## 2022-06-19 MED ORDER — PANTOPRAZOLE SODIUM 20 MG PO TBEC
20.0000 mg | DELAYED_RELEASE_TABLET | Freq: Every day | ORAL | Status: DC
  Administered 2022-06-19 – 2022-06-24 (×6): 20 mg via ORAL
  Filled 2022-06-19 (×7): qty 1

## 2022-06-19 NOTE — ED Provider Notes (Signed)
[facility] Provider Note   CSN: [id number] Arrival date & time: 06/19/22  [id number]     History  Chief Complaint  Patient presents with   Chest Pain    [name] is a 89 y.o. male.  89 year old male with prior medical history as detailed below presents for evaluation.  Patient was complaint of chronic hematuria times several weeks.  Patient reports that he has started to have significant fatigue and weakness over the last week.  Patient has been told that his hemoglobin is dropping secondary to his hematuria.  Patient is on Eliquis.  He was taking it twice a day until yesterday.  At that time he cut down to Eliquis once a day.  This morning he felt significantly weaker.  This was associated with some vague chest discomfort and shortness of breath.  He called EMS for transport.  He is known to urology.  He has an appointment with [facility] on Tuesday of this coming week.  The majority of his care is at the [place].    The history is provided by the patient and medical records.       Home Medications Prior to Admission medications   Medication Sig Start Date End Date Taking? Authorizing Provider  amLODipine (NORVASC) 10 MG tablet Take 10 mg by mouth daily.    [provider]  apixaban (ELIQUIS) 5 MG TABS tablet Take 5 mg by mouth 2 (two) times daily.    [provider]  atorvastatin (LIPITOR) 40 MG tablet Take 40 mg by mouth at bedtime.    [provider]  bacitracin ointment Apply 1 Application topically 2 (two) times daily. 02/22/22   Sherwood Gambler, MD  ezetimibe (ZETIA) 10 MG tablet Take 5 mg by mouth at bedtime.    [provider]  ferrous sulfate 325 (65 FE) MG tablet Take 325 mg by mouth daily with breakfast.    [provider]  finasteride (PROSCAR) 5 MG tablet Take 5 mg by mouth daily at 12 noon.    [provider]  hydrochlorothiazide (HYDRODIURIL) 25 MG tablet Take  25 mg by mouth daily.    [provider]  losartan (COZAAR) 100 MG tablet Take 1 tablet by mouth daily in the afternoon. 12/21/21   [provider]  Multiple Vitamin (MULTIVITAMIN WITH MINERALS) TABS tablet Take 1 tablet by mouth daily.    [provider]  Multiple Vitamins-Minerals (PRESERVISION AREDS 2 PO) Take 1 capsule by mouth 2 (two) times daily.     [provider]  pantoprazole (PROTONIX) 20 MG tablet Take 20 mg by mouth daily.    [provider]  traMADol (ULTRAM) 50 MG tablet Take 50 mg by mouth every 6 (six) hours as needed for moderate pain.     [provider]      Allergies    Monosodium glutamate and Lisinopril    Review of Systems   Review of Systems  All other systems reviewed and are negative.   Physical Exam Updated Vital Signs There were no vitals taken for this visit. Physical Exam Vitals and nursing note reviewed.  Constitutional:      General: He is not in acute distress.    Appearance: Normal appearance. He is well-developed.  HENT:     Head: Normocephalic and atraumatic.  Eyes:     Conjunctiva/sclera: Conjunctivae normal.     Pupils: Pupils are equal, round, and reactive to light.  Comments: Pale conjunctivae   Cardiovascular:     Rate and Rhythm: Normal rate and regular rhythm.     Heart sounds: Normal heart sounds.  Pulmonary:     Effort: Pulmonary effort is normal. No respiratory distress.     Breath sounds: Normal breath sounds.  Abdominal:     General: There is no distension.     Palpations: Abdomen is soft.     Tenderness: There is no abdominal tenderness.  Musculoskeletal:        General: No deformity. Normal range of motion.     Cervical back: Normal range of motion and neck supple.  Skin:    General: Skin is warm and dry.  Neurological:     General: No focal deficit present.     Mental Status: He is alert and oriented to person, place, and time.     ED Results / Procedures /  Treatments   Labs (all labs ordered are listed, but only abnormal results are displayed) Labs Reviewed  BASIC METABOLIC PANEL  CBC  PROTIME-INR  URINALYSIS, ROUTINE W REFLEX MICROSCOPIC  BRAIN NATRIURETIC PEPTIDE  TYPE AND SCREEN  TROPONIN I (HIGH SENSITIVITY)    EKG EKG Interpretation  Date/Time:  Saturday June 19 2022 08:33:14 EST Ventricular Rate:  56 PR Interval:    QRS Duration: 157 QT Interval:  516 QTC Calculation: 499 R Axis:   18 Text Interpretation: Atrial fibrillation Right bundle branch block ST depr, consider ischemia, anterolateral lds Confirmed by Dene Gentry [phone]) on 06/19/2022 8:36:03 AM  Radiology No results found.  Procedures Procedures    Medications Ordered in ED Medications - No data to display  ED Course/ Medical Decision Making/ A&P                             Medical Decision Making Amount and/or Complexity of Data Reviewed Labs: ordered. Radiology: ordered.    Medical Screen Complete  This patient presented to the ED with complaint of hematuria, weakness, chest discomfort, shortness of breath.  This complaint involves an extensive number of treatment options. The initial differential diagnosis includes, but is not limited to, symptomatic anemia, infection, ACS, angina, metabolic abnormality, etc.  This presentation is: Acute, Chronic, Self-Limited, Previously Undiagnosed, Uncertain Prognosis, Complicated, Systemic Symptoms, and Threat to Life/Bodily Function   Patient with history of CABG, hypertension, hyperlipidemia, paroxysmal A-fib on Eliquis presents with complaint of near constant hematuria x 2 to 3 weeks.  Patient has completed 2 courses of antibiotics for treatment of his hematuria.  He is continued his Eliquis until yesterday.  Patient reports that his last dose of Eliquis was taken yesterday.  Patient complained of chest discomfort and pressure this morning which caused him to come to the ED for evaluation.  He is  chest pain-free on evaluation here in the ED.  Patient is producing grossly bloody urine.  Hemoglobin today is 8.1 down from hemoglobin of approximately 10.  Patient's presentation and workup is concerning for possible symptomatic anemia in the setting of persistent hematuria while on Eliquis.  Cardiology [facility]) is aware of case and will consult.  Urology is also aware of case and will consult.    Both services recommended holding Eliquis at this time.  Hospitalist service is aware of case and evaluate for admission.   Additional history obtained:  External records from outside sources obtained and reviewed including prior ED visits and prior Inpatient records.    Lab Tests:  I  ordered and personally interpreted labs.  The pertinent results include: CBC, BMP, UA, troponin x 2, BNP,   Imaging Studies ordered:  I ordered imaging studies including chest x-ray I independently visualized and interpreted obtained imaging which showed atelectasis, possible effusion I agree with the radiologist interpretation.   Cardiac Monitoring:  The patient was maintained on a cardiac monitor.  I personally viewed and interpreted the cardiac monitor which showed an underlying rhythm of: NSR  Problem List / ED Course:  Chest pain, hematuria, AKI   Reevaluation:  After the interventions noted above, I reevaluated the patient and found that they have: improved  Disposition:  After consideration of the diagnostic results and the patients response to treatment, I feel that the patent would benefit from admission.          Final Clinical Impression(s) / ED Diagnoses Final diagnoses:  Chest pain, unspecified type  Hematuria, unspecified type    Rx / DC Orders ED Discharge Orders     None         Valarie Merino, MD 06/19/22 1152

## 2022-06-19 NOTE — Consult Note (Signed)
CONSULTATION NOTE   Patient Name: [name] Date of Encounter: 06/19/2022 Cardiologist: Sinclair Grooms, MD (Inactive) Electrophysiologist: None Advanced Heart Failure: None   Chief Complaint   Chest pain, blood in the urine  Patient Profile   89 yo male with history of CAD and prior CABG x 4 in 2000, presents with chest and hematuria.  HPI   [name] is a 89 y.o. male who is being seen today for the evaluation of chest pain and hematuria at the request of Dr. Francia Greaves. This is a 89 year old male patient, formally followed by Dr. Tamala Julian with a history of CABG times 07/1998, hypertension, dyslipidemia, PAF, right bundle branch block and history of embolic CVA.  He has been anticoagulated on Eliquis.  He was last seen in October and not having any anginal symptoms.  He gets medications from the [facility].  He now presents with several weeks of notable hematuria.  He has started to have significant fatigue and weakness over the past week and he was noted to be significantly anemic.  Today he developed some chest discomfort associated with that and called EMS.  [facility] was [facility] Tuesday.  Initial lab work showed a hemoglobin of 8.1, BNP 786.5 and troponin 162 initially but subsequent troponin was 98.  Creatinine 1.82, with a GFR of 26 given age.  A comparable creatinine 3 months ago was 1.62.  His chest pain is described as vague, diffusely over the left chest, not worse with exertion or relieved by rest.  His last echo was in 2016, demonstrated normal LVEF 60 to 65%.  PMHx   Past Medical History:  Diagnosis Date   BPH (benign prostatic hyperplasia)    Coronary atherosclerosis of artery bypass graft 12/20/2013   CASHD with CABG 2000 with LIMA to LAD, SV graft to diagonal, SVG to OM, and saphenous vein graft to distal RCA.      Essential hypertension 12/20/2013   GERD (gastroesophageal reflux disease)    Hyperlipidemia 12/20/2013   Paroxysmal  atrial fibrillation (HCC)    Right bundle branch block 12/20/2013   Stroke [facility])     Past Surgical History:  Procedure Laterality Date   CARDIAC SURGERY  2000   BYPASS   CARDIAC SURGERY     quadruple by pass surgery   HERNIA REPAIR  2005   LOOP RECORDER IMPLANT     PROSTATE SURGERY  1985   TEE WITHOUT CARDIOVERSION N/A 04/02/2014   Procedure: TRANSESOPHAGEAL ECHOCARDIOGRAM (TEE);  Surgeon: Lelon Perla, MD;  Location: [facility] ENDOSCOPY;  Service: Cardiovascular;  Laterality: N/A;    FAMHx   Family History  Problem Relation Age of Onset   Hypertension Mother     SOCHx    reports that he has never smoked. He has never used smokeless tobacco. He reports current alcohol use of about 1.0 standard drink of alcohol per week. He reports that he does not use drugs.  Outpatient Medications   No current facility-administered medications on file prior to encounter.   Current Outpatient Medications on File Prior to Encounter  Medication Sig Dispense Refill   amLODipine (NORVASC) 10 MG tablet Take 10 mg by mouth daily.     apixaban (ELIQUIS) 5 MG TABS tablet Take 5 mg by mouth 2 (two) times daily.     atorvastatin (LIPITOR) 40 MG tablet Take 40 mg by mouth at bedtime.     bacitracin ointment Apply 1 Application topically 2 (two) times  daily. 120 g 0   ezetimibe (ZETIA) 10 MG tablet Take 5 mg by mouth at bedtime.     ferrous sulfate 325 (65 FE) MG tablet Take 325 mg by mouth daily with breakfast.     finasteride (PROSCAR) 5 MG tablet Take 5 mg by mouth daily at 12 noon.     hydrochlorothiazide (HYDRODIURIL) 25 MG tablet Take 25 mg by mouth daily.     losartan (COZAAR) 100 MG tablet Take 1 tablet by mouth daily in the afternoon.     Multiple Vitamin (MULTIVITAMIN WITH MINERALS) TABS tablet Take 1 tablet by mouth daily.     Multiple Vitamins-Minerals (PRESERVISION AREDS 2 PO) Take 1 capsule by mouth 2 (two) times daily.      pantoprazole (PROTONIX) 20 MG tablet Take 20 mg by mouth daily.      traMADol (ULTRAM) 50 MG tablet Take 50 mg by mouth every 6 (six) hours as needed for moderate pain.   0    Inpatient Medications    Scheduled Meds:   Continuous Infusions:   PRN Meds:    ALLERGIES   Allergies  Allergen Reactions   Monosodium Glutamate Anaphylaxis   Lisinopril Other (See Comments)    ROS   Pertinent items noted in HPI and remainder of comprehensive ROS otherwise negative.  Vitals   Vitals:   06/19/22 1200 06/19/22 1215 06/19/22 1230 06/19/22 1245  BP: (!) 152/60 (!) 158/65 (!) 156/64 (!) 159/64  Pulse: (!) 52 (!) 57 60 63  Resp: 17 (!) 21 (!) 24 17  Temp:      TempSrc:      SpO2: 94% 94% 93% 94%   No intake or output data in the 24 hours ending 06/19/22 1300 There were no vitals filed for this visit.  Physical Exam   General appearance: alert, no distress, and pale Neck: JVD - 3 cm above sternal notch, no carotid bruit, and thyroid not enlarged, symmetric, no tenderness/mass/nodules Lungs: clear to auscultation bilaterally Heart: regular rate and rhythm Abdomen: soft, non-tender; bowel sounds normal; no masses,  no organomegaly Extremities: edema 1+ bilateral with RLE erythema Pulses: 2+ and symmetric Skin: pale with RLE erythema Neurologic: Grossly normal Psych: Pleasant  Labs   Results for orders placed or performed during the hospital encounter of 06/19/22 (from the past 48 hour(s))  Basic metabolic panel     Status: Abnormal   Collection Time: 06/19/22  8:19 AM  Result Value Ref Range   Sodium 142 135 - 145 mmol/L   Potassium 4.0 3.5 - 5.1 mmol/L   Chloride 112 (H) 98 - 111 mmol/L   CO2 21 (L) 22 - 32 mmol/L   Glucose, Bld 130 (H) 70 - 99 mg/dL    Comment: Glucose reference range applies only to samples taken after fasting for at least 8 hours.   BUN 53 (H) 8 - 23 mg/dL   Creatinine, Ser 1.82 (H) 0.61 - 1.24 mg/dL   Calcium 8.7 (L) 8.9 - 10.3 mg/dL   GFR, Estimated 33 (L) >60 mL/min    Comment: (NOTE) Calculated using the  CKD-EPI Creatinine Equation (2021)    Anion gap 9 5 - 15    Comment: Performed at [facility] [address], [place] [address]  CBC     Status: Abnormal   Collection Time: 06/19/22  8:19 AM  Result Value Ref Range   WBC 7.0 4.0 - 10.5 K/uL   RBC 2.67 (L) 4.22 - 5.81 MIL/uL   Hemoglobin  8.1 (L) 13.0 - 17.0 g/dL   HCT 24.6 (L) 39.0 - 52.0 %   MCV 92.1 80.0 - 100.0 fL   MCH 30.3 26.0 - 34.0 pg   MCHC 32.9 30.0 - 36.0 g/dL   RDW 17.2 (H) 11.5 - 15.5 %   Platelets 258 150 - 400 K/uL   nRBC 0.0 0.0 - 0.2 %    Comment: Performed at [facility] [address] [place], [place] [address]  Troponin I (High Sensitivity)     Status: Abnormal   Collection Time: 06/19/22  8:19 AM  Result Value Ref Range   Troponin I (High Sensitivity) 162 (HH) <18 ng/L    Comment: CRITICAL RESULT CALLED TO, READ BACK BY AND VERIFIED WITH E,ANELLO RN '@0920'$  06/19/22 E,[place]ENT[place] (NOTE) Elevated high sensitivity troponin I (hsTnI) values and significant  changes across serial measurements may suggest ACS but many other  chronic and acute conditions are known to elevate hsTnI results.  Refer to the "Links" section for chest pain algorithms and additional  guidance. Performed at [facility], [place] [address], [place], [place] [address]   Protime-INR (order if Patient is taking Coumadin / Warfarin)     Status: Abnormal   Collection Time: 06/19/22  8:19 AM  Result Value Ref Range   Prothrombin Time 15.3 (H) 11.4 - 15.2 seconds   INR 1.2 0.8 - 1.2    Comment: (NOTE) INR goal varies based on device and disease states. Performed at [facility], [place] [address], [place], [place] [address]   Brain natriuretic peptide     Status: Abnormal   Collection Time: 06/19/22  8:30 AM  Result Value Ref Range   B Natriuretic Peptide 786.5 (H) 0.0 - 100.0 pg/mL    Comment: Performed at [facility] [address] [place], [place] [address]  ABO/Rh     Status: None   Collection Time:  06/19/22  8:45 AM  Result Value Ref Range   ABO/RH(D)      O POS Performed at [facility] [address], [place], [place] [address]   Type and screen [place]     Status: None (Preliminary result)   Collection Time: 06/19/22  8:50 AM  Result Value Ref Range   ABO/RH(D) O POS    Antibody Screen NEG    Sample Expiration      06/22/2022,2359 Performed at [facility], [place] [address] [place], [place] [address]    Unit Number [id number]    Blood Component Type RED CELLS,LR    Unit division 00    Status of Unit ALLOCATED    Transfusion Status OK TO TRANSFUSE    Crossmatch Result Compatible   Troponin I (High Sensitivity)     Status: Abnormal   Collection Time: 06/19/22 10:19 AM  Result Value Ref Range   Troponin I (High Sensitivity) 98 (H) <18 ng/L    Comment: DELTA CHECK NOTED (NOTE) Elevated high sensitivity troponin I (hsTnI) values and significant  changes across serial measurements may suggest ACS but many other  chronic and acute conditions are known to elevate hsTnI results.  Refer to the "Links" section for chest pain algorithms and additional  guidance. Performed at [facility], [place] [address], [place],  [address]   Urinalysis, Routine w reflex microscopic -Urine, Clean Catch     Status: Abnormal   Collection Time: 06/19/22 10:55 AM  Result Value Ref Range  Color, Urine RED (A) YELLOW    Comment: MICROSCOPIC EXAM PERFORMED ON UNCONCENTRATED URINE BIOCHEMICALS MAY BE AFFECTED BY COLOR    APPearance TURBID (A) CLEAR    Comment: Grossly Bloody   Specific Gravity, Urine  1.005 - 1.030    TEST NOT REPORTED DUE TO COLOR INTERFERENCE OF URINE PIGMENT   pH  5.0 - 8.0    TEST NOT REPORTED DUE TO COLOR INTERFERENCE OF URINE PIGMENT   Glucose, UA (A) NEGATIVE mg/dL    TEST NOT REPORTED DUE TO COLOR INTERFERENCE OF URINE PIGMENT    Comment: TEST NOT REPORTED DUE TO COLOR INTERFERENCE OF URINE PIGMENT   Hgb urine dipstick  (A) NEGATIVE    TEST NOT REPORTED DUE TO COLOR INTERFERENCE OF URINE PIGMENT   Bilirubin Urine (A) NEGATIVE    TEST NOT REPORTED DUE TO COLOR INTERFERENCE OF URINE PIGMENT   Ketones, ur (A) NEGATIVE mg/dL    TEST NOT REPORTED DUE TO COLOR INTERFERENCE OF URINE PIGMENT   Protein, ur (A) NEGATIVE mg/dL    TEST NOT REPORTED DUE TO COLOR INTERFERENCE OF URINE PIGMENT   Nitrite (A) NEGATIVE    TEST NOT REPORTED DUE TO COLOR INTERFERENCE OF URINE PIGMENT   Leukocytes,Ua (A) NEGATIVE    TEST NOT REPORTED DUE TO COLOR INTERFERENCE OF URINE PIGMENT    Comment: Performed at [facility], [place] [address], [place], [place] [address]  Urinalysis, Microscopic (reflex)     Status: Abnormal   Collection Time: 06/19/22 10:55 AM  Result Value Ref Range   RBC / HPF >50 0 - 5 RBC/hpf   WBC, UA 6-10 0 - 5 WBC/hpf   Bacteria, UA RARE (A) NONE SEEN   Squamous Epithelial / HPF 0-5 0 - 5 /HPF    Comment: Performed at [facility], [place] [address], [place], [place] [address]  Prepare RBC (crossmatch)     Status: None   Collection Time: 06/19/22  1:30 PM  Result Value Ref Range   Order Confirmation      ORDER PROCESSED BY BLOOD BANK Performed at [facility], [place] [address] [place], [place] [address]     ECG   Afib with RBBB at 78 - Personally Reviewed  Telemetry   AFib with CVR - Personally Reviewed  Radiology   DG Chest 2 View  Result Date: 06/19/2022 CLINICAL DATA:  Chest pain EXAM: CHEST - 2 VIEW COMPARISON:  12/04/2014 FINDINGS: Telemetry leads overlie the chest. Cardiomegaly, CABG changes and cardiac recorder again noted. On the view, LOWER lobe airspace disease and consolidation/atelectasis noted. A probable small effusion is present. There is no evidence of pneumothorax or acute bony abnormality. IMPRESSION: LOWER lobe airspace disease/pneumonia with consolidation/atelectasis with probable small pleural effusion. Electronically Signed   By: Margarette Canada M.D.   On: 06/19/2022  09:25    Cardiac Studies   N/A  Impression   Principal Problem:   Hematuria Active Problems:   Chest pain   Iron deficiency anemia due to chronic blood loss   Acute on chronic diastolic CHF (congestive heart failure) (HCC)   CHF (congestive heart failure) (HCC)   Demand ischemia   Recommendation   Chest pain Reports bandlike upper abdominal/lower chest discomfort both last night and today, mild intensity somewhat atypical for angina.  Initial troponin was mildly elevated and the second troponin was lower.  No ischemic EKG changes.  I suspect the troponins may be due to bleeding, heart failure and or chronic kidney disease  or demand ischemia.  With acute hematuria and anemia, he is not a candidate for invasive evaluation, nor is he a candidate for heparinization.  Will check an echocardiogram since his last study was in 2016 to look for new cardiomyopathy or wall motion abnormalities. Acute congestive heart failure He denies any worsening shortness of breath or orthopnea but has had some lower extremity edema.  He initially felt that this was related to venous stasis after his bypass but endpoints has had some weeping of fluid from his legs.  BNP is elevated at 786.5 today in the setting of acute on chronic kidney injury.  Will likely need gentle diuresis. Hematuria Agree with holding anticoagulation.  Likely needs urologic evaluation.  He was scheduled to see them next week as an outpatient.  Would monitor for worsening anemia and consider transfusion if hemoglobin declines further. Atrial fibrillation Rate controlled-hold anticoagulation due to GU bleeding AKI on CKD 3B Creatinine is slightly worse likely due to prerenal etiology in the setting of GU bleeding and or possible postobstructive uropathy from clotting.  Thanks for the consultation.  Cardiology will follow with you.  Time Spent Directly with Patient:  I have spent a total of 45 minutes with the patient reviewing  hospital notes, telemetry, EKGs, labs and examining the patient as well as establishing an assessment and plan that was discussed personally with the patient.  > 50% of time was spent in direct patient care.  Length of Stay:  LOS: 0 days   Pixie Casino, MD, [facility], [place] Director of the Advanced Lipid Disorders &  Cardiovascular Risk Reduction Clinic Diplomate of the American Board of Clinical Lipidology Attending Cardiologist  Direct Dial: [phone]  Fax: [phone]  Website:  [email or web address] [email or web address] Robel Wuertz 06/19/2022, 1:00 PM

## 2022-06-19 NOTE — ED Notes (Signed)
Pt is sitting at bedside eating

## 2022-06-19 NOTE — ED Notes (Signed)
Pt sating 92% with head elevated, placed on 2L [place] for O2 support

## 2022-06-19 NOTE — ED Notes (Addendum)
While observing the patient's cardiac monitor, the staff observed that the patient's heart rate decreased to 36 and remained in the high 30s for approximately 20 seconds before increasing back to 56. The patient is maintaining a heart rate in the mid 9s. The RN was notified of the event. Staff will continue to monitor.

## 2022-06-19 NOTE — ED Notes (Signed)
ED TO INPATIENT HANDOFF REPORT  ED Nurse Name and Phone #: Vikki Ports [phone]  S Name/Age/Gender [name] 89 y.o. male Room/Bed: 043C/043C  Code Status   Code Status: Full Code  Home/SNF/Other Home Patient oriented to: self, place, time, and situation Is this baseline? Yes   Triage Complete: Triage complete  Chief Complaint CHF (congestive heart failure) ([place]) [I50.9]  Triage Note Pt BIBGEMS from home for cp that started at 0300 anterior chest and epigastric. Took tums twice with some relief. Able to sleep and then woke up again with pain took a nitro then went to bed. Awoke again with same pain, took another nitro.    2/10 dull ache  Hx cabbage  Current UTI - 2 doses abx, blood in urine  ASA 325 mg 0500  18 L forearm  170/66 55 hr 94 % RA 191 CBG     Allergies Allergies  Allergen Reactions   Monosodium Glutamate Anaphylaxis   Lisinopril Other (See Comments)    Level of Care/Admitting Diagnosis ED Disposition     ED Disposition  Admit   Condition  --   [place]: [place] [[id number]]  Level of Care: Telemetry Medical [104]  May admit patient to [facility] or [facility] if equivalent level of care is available:: No  Covid Evaluation: Asymptomatic - no recent exposure (last 10 days) testing not required  Diagnosis: CHF (congestive heart failure) [facility]) [id number]  Admitting Physician: Lequita Halt [id number]  Attending Physician: Lequita Halt [id number]  Certification:: I certify this patient will need inpatient services for at least 2 midnights  Estimated Length of Stay: 2          B Medical/Surgery History Past Medical History:  Diagnosis Date   BPH (benign prostatic hyperplasia)    Coronary atherosclerosis of artery bypass graft 12/20/2013   CASHD with CABG 2000 with LIMA to LAD, SV graft to diagonal, SVG to OM, and saphenous vein graft to distal RCA.      Essential hypertension 12/20/2013   GERD  (gastroesophageal reflux disease)    Hyperlipidemia 12/20/2013   Paroxysmal atrial fibrillation (HCC)    Right bundle branch block 12/20/2013   Stroke [facility])    Past Surgical History:  Procedure Laterality Date   CARDIAC SURGERY  2000   BYPASS   CARDIAC SURGERY     quadruple by pass surgery   HERNIA REPAIR  2005   LOOP RECORDER IMPLANT     PROSTATE SURGERY  1985   TEE WITHOUT CARDIOVERSION N/A 04/02/2014   Procedure: TRANSESOPHAGEAL ECHOCARDIOGRAM (TEE);  Surgeon: Lelon Perla, MD;  Location: [facility] ENDOSCOPY;  Service: Cardiovascular;  Laterality: N/A;     A IV Location/Drains/Wounds Patient Lines/Drains/Airways Status     Active Line/Drains/Airways     Name Placement date Placement time Site Days   Peripheral IV 06/19/22 18 G Anterior;Left Forearm 06/19/22  --  Forearm  less than 1            Intake/Output Last 24 hours No intake or output data in the 24 hours ending 06/19/22 1442  Labs/Imaging Results for orders placed or performed during the hospital encounter of 06/19/22 (from the past 48 hour(s))  Basic metabolic panel     Status: Abnormal   Collection Time: 06/19/22  8:19 AM  Result Value Ref Range   Sodium 142 135 - 145 mmol/L   Potassium 4.0 3.5 - 5.1 mmol/L   Chloride 112 (H) 98 - 111 mmol/L  CO2 21 (L) 22 - 32 mmol/L   Glucose, Bld 130 (H) 70 - 99 mg/dL    Comment: Glucose reference range applies only to samples taken after fasting for at least 8 hours.   BUN 53 (H) 8 - 23 mg/dL   Creatinine, Ser 1.82 (H) 0.61 - 1.24 mg/dL   Calcium 8.7 (L) 8.9 - 10.3 mg/dL   GFR, Estimated 33 (L) >60 mL/min    Comment: (NOTE) Calculated using the CKD-EPI Creatinine Equation (2021)    Anion gap 9 5 - 15    Comment: Performed at [facility] [address], [place], [place] [address]  CBC     Status: Abnormal   Collection Time: 06/19/22  8:19 AM  Result Value Ref Range   WBC 7.0 4.0 - 10.5 K/uL   RBC 2.67 (L) 4.22 - 5.81 MIL/uL   Hemoglobin 8.1 (L) 13.0 -  17.0 g/dL   HCT 24.6 (L) 39.0 - 52.0 %   MCV 92.1 80.0 - 100.0 fL   MCH 30.3 26.0 - 34.0 pg   MCHC 32.9 30.0 - 36.0 g/dL   RDW 17.2 (H) 11.5 - 15.5 %   Platelets 258 150 - 400 K/uL   nRBC 0.0 0.0 - 0.2 %    Comment: Performed at [facility], [place] [address] [place], [place] [address]  Troponin I (High Sensitivity)     Status: Abnormal   Collection Time: 06/19/22  8:19 AM  Result Value Ref Range   Troponin I (High Sensitivity) 162 (HH) <18 ng/L    Comment: CRITICAL RESULT CALLED TO, READ BACK BY AND VERIFIED WITH E,ANELLO RN '@0920'$  06/19/22 E,[place]ENT[place] (NOTE) Elevated high sensitivity troponin I (hsTnI) values and significant  changes across serial measurements may suggest ACS but many other  chronic and acute conditions are known to elevate hsTnI results.  Refer to the "Links" section for chest pain algorithms and additional  guidance. Performed at [facility], [place] [address], [place], [place] [address]   Protime-INR (order if Patient is taking Coumadin / Warfarin)     Status: Abnormal   Collection Time: 06/19/22  8:19 AM  Result Value Ref Range   Prothrombin Time 15.3 (H) 11.4 - 15.2 seconds   INR 1.2 0.8 - 1.2    Comment: (NOTE) INR goal varies based on device and disease states. Performed at [facility], [place] [address], [place], [place] [address]   Brain natriuretic peptide     Status: Abnormal   Collection Time: 06/19/22  8:30 AM  Result Value Ref Range   B Natriuretic Peptide 786.5 (H) 0.0 - 100.0 pg/mL    Comment: Performed at [facility] [address] [place], [place] [address]  ABO/Rh     Status: None   Collection Time: 06/19/22  8:45 AM  Result Value Ref Range   ABO/RH(D)      O POS Performed at [facility] [address] [place],  [address]   Type and screen [place]     Status: None (Preliminary result)   Collection Time: 06/19/22  8:50 AM  Result Value Ref Range   ABO/RH(D) O POS    Antibody  Screen NEG    Sample Expiration 06/22/2022,2359    Unit Number [id number]    Blood Component Type RED CELLS,LR    Unit division 00    Status of Unit ISSUED    Transfusion Status OK TO TRANSFUSE  Crossmatch Result      Compatible Performed at [facility], [place] [address] [place], [place] [address]   Troponin I (High Sensitivity)     Status: Abnormal   Collection Time: 06/19/22 10:19 AM  Result Value Ref Range   Troponin I (High Sensitivity) 98 (H) <18 ng/L    Comment: DELTA CHECK NOTED (NOTE) Elevated high sensitivity troponin I (hsTnI) values and significant  changes across serial measurements may suggest ACS but many other  chronic and acute conditions are known to elevate hsTnI results.  Refer to the "Links" section for chest pain algorithms and additional  guidance. Performed at [facility], [place] [address], [place], [place] [address]   Urinalysis, Routine w reflex microscopic -Urine, Clean Catch     Status: Abnormal   Collection Time: 06/19/22 10:55 AM  Result Value Ref Range   Color, Urine RED (A) YELLOW    Comment: MICROSCOPIC EXAM PERFORMED ON UNCONCENTRATED URINE BIOCHEMICALS MAY BE AFFECTED BY COLOR    APPearance TURBID (A) CLEAR    Comment: Grossly Bloody   Specific Gravity, Urine  1.005 - 1.030    TEST NOT REPORTED DUE TO COLOR INTERFERENCE OF URINE PIGMENT   pH  5.0 - 8.0    TEST NOT REPORTED DUE TO COLOR INTERFERENCE OF URINE PIGMENT   Glucose, UA (A) NEGATIVE mg/dL    TEST NOT REPORTED DUE TO COLOR INTERFERENCE OF URINE PIGMENT    Comment: TEST NOT REPORTED DUE TO COLOR INTERFERENCE OF URINE PIGMENT   Hgb urine dipstick (A) NEGATIVE    TEST NOT REPORTED DUE TO COLOR INTERFERENCE OF URINE PIGMENT   Bilirubin Urine (A) NEGATIVE    TEST NOT REPORTED DUE TO COLOR INTERFERENCE OF URINE PIGMENT   Ketones, ur (A) NEGATIVE mg/dL    TEST NOT REPORTED DUE TO COLOR INTERFERENCE OF URINE PIGMENT   Protein, ur (A) NEGATIVE mg/dL    TEST NOT REPORTED  DUE TO COLOR INTERFERENCE OF URINE PIGMENT   Nitrite (A) NEGATIVE    TEST NOT REPORTED DUE TO COLOR INTERFERENCE OF URINE PIGMENT   Leukocytes,Ua (A) NEGATIVE    TEST NOT REPORTED DUE TO COLOR INTERFERENCE OF URINE PIGMENT    Comment: Performed at [facility], [place] [address], [place], [place] [address]  Urinalysis, Microscopic (reflex)     Status: Abnormal   Collection Time: 06/19/22 10:55 AM  Result Value Ref Range   RBC / HPF >50 0 - 5 RBC/hpf   WBC, UA 6-10 0 - 5 WBC/hpf   Bacteria, UA RARE (A) NONE SEEN   Squamous Epithelial / HPF 0-5 0 - 5 /HPF    Comment: Performed at [facility], [place] [address], [place], [place] [address]  Prepare RBC (crossmatch)     Status: None   Collection Time: 06/19/22  1:30 PM  Result Value Ref Range   Order Confirmation      ORDER PROCESSED BY BLOOD BANK Performed at [facility], [place] [address], [place], [place] [address]    US RENAL  Result Date: 06/19/2022 CLINICAL DATA:  Hematuria EXAM: RENAL / URINARY TRACT ULTRASOUND COMPLETE COMPARISON:  CT abdomen pelvis 01/18/2019 FINDINGS: Right Kidney: Renal measurements: 9.5 x 5.3 x 5.3 cm = volume: 140.6 mL. Renal cortical thinning. Increased cortical echogenicity. No hydronephrosis. Multiple cysts within the right kidney measuring up to 2.7 cm. Left Kidney: Renal measurements: 10.9 x 4.6 x 5.3 cm = volume: 137.8 mL. Renal cortical thinning. Increased cortical  echogenicity. No hydronephrosis. Multiple cysts measuring up to 2.6 cm in the midpole. Bladder: Suggestion of echogenic layering debris within the urinary bladder lumen. Other: Prostate is markedly enlarged and impresses upon the bladder base. IMPRESSION: 1. No hydronephrosis. 2. Bilateral renal cortical thinning and increased cortical echogenicity suggestive of chronic medical renal disease. 3. Suggestion of echogenic layering debris within the urinary bladder lumen. Correlate with urinalysis. 4. Markedly enlarged prostate. Electronically  Signed   By: Lovey Newcomer M.D.   On: 06/19/2022 13:48   DG Chest 2 View  Result Date: 06/19/2022 CLINICAL DATA:  Chest pain EXAM: CHEST - 2 VIEW COMPARISON:  12/04/2014 FINDINGS: Telemetry leads overlie the chest. Cardiomegaly, CABG changes and cardiac recorder again noted. On the view, LOWER lobe airspace disease and consolidation/atelectasis noted. A probable small effusion is present. There is no evidence of pneumothorax or acute bony abnormality. IMPRESSION: LOWER lobe airspace disease/pneumonia with consolidation/atelectasis with probable small pleural effusion. Electronically Signed   By: Margarette Canada M.D.   On: 06/19/2022 09:25    Pending Labs Unresulted Labs (From admission, onward)     Start     Ordered   06/20/22 [id number]  Basic metabolic panel  Tomorrow morning,   R        06/19/22 1242   06/20/22 0500  CBC  Tomorrow morning,   R        06/19/22 1242            Vitals/Pain Today's Vitals   06/19/22 1230 06/19/22 1245 06/19/22 1404 06/19/22 1429  BP: (!) 156/64 (!) 159/64 (!) 164/76 (!) 151/58  Pulse: 60 63 67 (!) 59  Resp: (!) '24 17 20 18  '$ Temp:   98.1 F (36.7 C) 97.7 F (36.5 C)  TempSrc:   Oral   SpO2: 93% 94% 94% 94%  PainSc:        Isolation Precautions No active isolations  Medications Medications  traMADol (ULTRAM) tablet 50 mg (has no administration in time range)  amLODipine (NORVASC) tablet 10 mg (has no administration in time range)  atorvastatin (LIPITOR) tablet 40 mg (has no administration in time range)  ezetimibe (ZETIA) tablet 5 mg (has no administration in time range)  pantoprazole (PROTONIX) EC tablet 20 mg (has no administration in time range)  finasteride (PROSCAR) tablet 5 mg (has no administration in time range)  ferrous sulfate tablet 325 mg (has no administration in time range)  isosorbide mononitrate (IMDUR) 24 hr tablet 30 mg (has no administration in time range)  hydrALAZINE (APRESOLINE) tablet 10 mg (has no administration in time range)   hydrALAZINE (APRESOLINE) injection 2 mg (has no administration in time range)  furosemide (LASIX) injection 20 mg (has no administration in time range)  0.9 %  sodium chloride infusion (Manually program via Guardrails IV Fluids) (has no administration in time range)    Mobility walks     Focused Assessments Cardiac Assessment Handoff:    Lab Results  Component Value Date   TROPONINI <0.03 12/05/2014   No results found for: "DDIMER" Does the Patient currently have chest pain? Yes    R Recommendations: See Admitting Provider Note  Report given to:   Additional Notes: Pt receiving blood transfusion started at 1414

## 2022-06-19 NOTE — H&P (Addendum)
History and Physical    [name] [id number] DOB: [birth year] DOA: 06/19/2022  PCP: [name], NP (Confirm with patient/family/NH records and if not entered, this has to be entered at [facility] point of entry) Patient coming from: Home  I have personally briefly reviewed patient's old medical records in [place]  Chief Complaint: persistent hematuria, SOB, chest pain  HPI: [name] is a 89 y.o. male with medical history significant of CAD status post CABG in 1990s, HTN, PAF on Eliquis, CKD stage IIIb, BPH status post TURP, presented with persistent hematuria, worsening shortness of breath and new onset of chest pain.  Symptoms first developed about 3 weeks ago, patient started to have hematuria, " can feel the clot passing" but blood was most mixed with urine. associated with sharp pain lower abdomen, when he went to PCP and was diagnosed with UTI and treated 1 week of antibiotics.  During which time he continued to take Eliquis.  After antibiotics for 1 week, his hematuria resolved and no more lower abdominal pain.  However last week, hematuria came back surgery with occasional suprapubic pain and he was treated with another round of antibiotics for 7 days which he completed yesterday.  This time, able he did not see any improvement of hematuria.  Denies any fever or chills.  2 days ago, he went to see nephrologist who recommended him to hold off Eliquis.  The last time he took Eliquis was yesterday morning.  Last 2 days, patient also developed exertional dyspnea, today, even walking short distance 2 to 3 minutes ventricular significant shortness of breath and he also noticed increasing swelling of his bilateral legs.  Last night, patient woke up with severe pressure-like chest pain across the chest, 5-6/10, associated with shortness of breath.  Pain has been constant, no significant relieving factors and he decided come to the hospital this morning.  ED Course:  Bradycardia,  blood pressure elevated 170/68 afebrile nonhypoxic.  UA showed gross hematuria.  Hemoglobin 8.1 compared to baseline 10-11, creatinine 1.8 about his baseline, BUN 53, K4.0.  Troponin 162> 98.  X-ray showed pulmonary congestion with possible left lower lobe atelectasis versus pleural effusion.  Cardiology and urology consulted in the ED. Review of Systems: As per HPI otherwise 14 point review of systems negative.   Past Medical History:  Diagnosis Date   BPH (benign prostatic hyperplasia)    Coronary atherosclerosis of artery bypass graft 12/20/2013   CASHD with CABG 2000 with LIMA to LAD, SV graft to diagonal, SVG to OM, and saphenous vein graft to distal RCA.      Essential hypertension 12/20/2013   GERD (gastroesophageal reflux disease)    Hyperlipidemia 12/20/2013   Paroxysmal atrial fibrillation (HCC)    Right bundle branch block 12/20/2013   Stroke [facility])     Past Surgical History:  Procedure Laterality Date   CARDIAC SURGERY  2000   BYPASS   CARDIAC SURGERY     quadruple by pass surgery   HERNIA REPAIR  2005   LOOP RECORDER IMPLANT     PROSTATE SURGERY  1985   TEE WITHOUT CARDIOVERSION N/A 04/02/2014   Procedure: TRANSESOPHAGEAL ECHOCARDIOGRAM (TEE);  Surgeon: Lelon Perla, MD;  Location: [facility] ENDOSCOPY;  Service: Cardiovascular;  Laterality: N/A;     reports that he has never smoked. He has never used smokeless tobacco. He reports current alcohol use of about 1.0 standard drink of alcohol per week. He reports that he does not use drugs.  Allergies  Allergen Reactions   Monosodium Glutamate Anaphylaxis   Lisinopril Other (See Comments)    Family History  Problem Relation Age of Onset   Hypertension Mother     Prior to Admission medications   Medication Sig Start Date End Date Taking? Authorizing Provider  amLODipine (NORVASC) 10 MG tablet Take 10 mg by mouth daily.    [provider]  apixaban (ELIQUIS) 5 MG TABS tablet Take 5 mg by mouth 2 (two) times  daily.    [provider]  atorvastatin (LIPITOR) 40 MG tablet Take 40 mg by mouth at bedtime.    [provider]  bacitracin ointment Apply 1 Application topically 2 (two) times daily. 02/22/22   Sherwood Gambler, MD  ezetimibe (ZETIA) 10 MG tablet Take 5 mg by mouth at bedtime.    [provider]  ferrous sulfate 325 (65 FE) MG tablet Take 325 mg by mouth daily with breakfast.    [provider]  finasteride (PROSCAR) 5 MG tablet Take 5 mg by mouth daily at 12 noon.    [provider]  hydrochlorothiazide (HYDRODIURIL) 25 MG tablet Take 25 mg by mouth daily.    [provider]  losartan (COZAAR) 100 MG tablet Take 1 tablet by mouth daily in the afternoon. 12/21/21   [provider]  Multiple Vitamin (MULTIVITAMIN WITH MINERALS) TABS tablet Take 1 tablet by mouth daily.    [provider]  Multiple Vitamins-Minerals (PRESERVISION AREDS 2 PO) Take 1 capsule by mouth 2 (two) times daily.     [provider]  pantoprazole (PROTONIX) 20 MG tablet Take 20 mg by mouth daily.    [provider]  traMADol (ULTRAM) 50 MG tablet Take 50 mg by mouth every 6 (six) hours as needed for moderate pain.     [provider]    Physical Exam: Vitals:   06/19/22 0900 06/19/22 1015 06/19/22 1050 06/19/22 1145  BP: (!) 143/48 (!) 139/46 (!) 154/62 (!) 149/53  Pulse: (!) 52 (!) 46 (!) 57 (!) 55  Resp: (!) 21 19 (!) 24 (!) 23  Temp:      TempSrc:      SpO2: 93% 90% 96% 94%    Constitutional: NAD, calm, comfortable Vitals:   06/19/22 0900 06/19/22 1015 06/19/22 1050 06/19/22 1145  BP: (!) 143/48 (!) 139/46 (!) 154/62 (!) 149/53  Pulse: (!) 52 (!) 46 (!) 57 (!) 55  Resp: (!) 21 19 (!) 24 (!) 23  Temp:      TempSrc:      SpO2: 93% 90% 96% 94%   Eyes: PERRL, lids and conjunctivae normal ENMT: Mucous membranes are moist. Posterior pharynx clear of any exudate or lesions.Normal dentition.  Neck: normal, supple,  no masses, no thyromegaly Respiratory: clear to auscultation bilaterally, no wheezing, fine crackles on bilateral lower fields, increasing breathing effort . No accessory muscle use.  Cardiovascular: Regular rate and rhythm, no murmurs / rubs / gallops. 2+ extremity edema. 2+ pedal pulses. No carotid bruits.  Abdomen: no tenderness, no masses palpated. No hepatosplenomegaly. Bowel sounds positive.  Musculoskeletal: no clubbing / cyanosis. No joint deformity upper and lower extremities. Good ROM, no contractures. Normal muscle tone.  Skin: no rashes, lesions, ulcers. No induration Neurologic: CN 2-12 grossly intact. Sensation intact, DTR normal. Strength 5/5 in all 4.  Psychiatric: Normal judgment and insight. Alert and oriented x 3. Normal mood.     Labs on Admission: I have personally reviewed following labs and imaging studies  CBC: Recent  Labs  Lab 06/19/22 0819  WBC 7.0  HGB 8.1*  HCT 24.6*  MCV 92.1  PLT 0000000   Basic Metabolic Panel: Recent Labs  Lab 06/19/22 0819  NA 142  K 4.0  CL 112*  CO2 21*  GLUCOSE 130*  BUN 53*  CREATININE 1.82*  CALCIUM 8.7*   GFR: CrCl cannot be calculated (Unknown ideal weight.). Liver Function Tests: No results for input(s): "AST", "ALT", "ALKPHOS", "BILITOT", "PROT", "ALBUMIN" in the last 168 hours. No results for input(s): "LIPASE", "AMYLASE" in the last 168 hours. No results for input(s): "AMMONIA" in the last 168 hours. Coagulation Profile: Recent Labs  Lab 06/19/22 0819  INR 1.2   Cardiac Enzymes: No results for input(s): "CKTOTAL", "CKMB", "CKMBINDEX", "TROPONINI" in the last 168 hours. BNP (last 3 results) No results for input(s): "PROBNP" in the last 8760 hours. HbA1C: No results for input(s): "HGBA1C" in the last 72 hours. CBG: No results for input(s): "GLUCAP" in the last 168 hours. Lipid Profile: No results for input(s): "CHOL", "HDL", "LDLCALC", "TRIG", "CHOLHDL", "LDLDIRECT" in the last 72 hours. Thyroid Function  Tests: No results for input(s): "TSH", "T4TOTAL", "FREET4", "T3FREE", "THYROIDAB" in the last 72 hours. Anemia Panel: No results for input(s): "VITAMINB12", "FOLATE", "FERRITIN", "TIBC", "IRON", "RETICCTPCT" in the last 72 hours. Urine analysis:    Component Value Date/Time   COLORURINE RED (A) 06/19/2022 1055   APPEARANCEUR TURBID (A) 06/19/2022 1055   LABSPEC  06/19/2022 1055    TEST NOT REPORTED DUE TO COLOR INTERFERENCE OF URINE PIGMENT   PHURINE  06/19/2022 1055    TEST NOT REPORTED DUE TO COLOR INTERFERENCE OF URINE PIGMENT   GLUCOSEU (A) 06/19/2022 1055    TEST NOT REPORTED DUE TO COLOR INTERFERENCE OF URINE PIGMENT   HGBUR (A) 06/19/2022 1055    TEST NOT REPORTED DUE TO COLOR INTERFERENCE OF URINE PIGMENT   BILIRUBINUR (A) 06/19/2022 1055    TEST NOT REPORTED DUE TO COLOR INTERFERENCE OF URINE PIGMENT   KETONESUR (A) 06/19/2022 1055    TEST NOT REPORTED DUE TO COLOR INTERFERENCE OF URINE PIGMENT   PROTEINUR (A) 06/19/2022 1055    TEST NOT REPORTED DUE TO COLOR INTERFERENCE OF URINE PIGMENT   NITRITE (A) 06/19/2022 1055    TEST NOT REPORTED DUE TO COLOR INTERFERENCE OF URINE PIGMENT   LEUKOCYTESUR (A) 06/19/2022 1055    TEST NOT REPORTED DUE TO COLOR INTERFERENCE OF URINE PIGMENT    Radiological Exams on Admission: DG Chest 2 View  Result Date: 06/19/2022 CLINICAL DATA:  Chest pain EXAM: CHEST - 2 VIEW COMPARISON:  12/04/2014 FINDINGS: Telemetry leads overlie the chest. Cardiomegaly, CABG changes and cardiac recorder again noted. On the view, LOWER lobe airspace disease and consolidation/atelectasis noted. A probable small effusion is present. There is no evidence of pneumothorax or acute bony abnormality. IMPRESSION: LOWER lobe airspace disease/pneumonia with consolidation/atelectasis with probable small pleural effusion. Electronically Signed   By: Margarette Canada M.D.   On: 06/19/2022 09:25    EKG: Independently reviewed.  Sinus rhythm, chronic ST changes on 2, 3, aVF, V  4-6  Assessment/Plan Principal Problem:   CHF (congestive heart failure) (HCC) Active Problems:   Chest pain   Iron deficiency anemia due to chronic blood loss   Hematuria   Acute on chronic diastolic CHF (congestive heart failure) ([place])  (please populate well all problems here in Problem List. (For example, if patient is on BP meds at home and you resume or decide to hold them, it is a problem that  needs to be her. Same for CAD, COPD, HLD and so on)  Symptomatic anemia, acute on chronic, secondary to persistent hematuria -Etiology less clear, given the patient received 2 rounds of antibiotic treatment, will hold off antibiotics for now. -Urology consulted and proposed cystoscopy scope. -Continue to hold Eliquis for now, until hematuria improved and H&H stabilized. -Given the significant anginal-like chest pain and increase of troponin levels, as well as evidence of CHF decompensation, decided to transfuse patient with 1 unit PRBC. -Renal ultrasound to rule out renal stones.  No such renal stone history increased past. -Bladder irrigation?   Acute HFpEF decompensation -Likely secondary to symptomatic anemia -Management as above -Echocardiogram -Hold off amlodipine given the worsening of peripheral edema  Elevated troponins -Trending of troponin elevation is downward 162> 98, implying demanding ischemia, unlikely ACS.  Likely secondary to acute decompensated CHF -1 unit PRBC -Echocardiogram -Cardiology on board -There is active hematuria and anemia, hold off heparin drip  CKD stage IIIb -Fluid overloaded secondary to CHF decompensation -Start IV Lasix 20 mg daily -Discontinue HCTZ and hold off ACEI  HTN -Start hydralazine and Imdur regimen -Hold off amlodipine HCTZ and ACEI  PAF -Hold off Eliquis -Rate controlled with no beta-blocker  DVT prophylaxis: SCD Code Status: Full code Family Communication: None at bedside Disposition Plan: Patient is sick with CHF  decompensations and persistent hematuria requiring inpatient urology consultation and cardiology management, expect more than 2 midnight hospital stay Consults called: Urology and cardiology Admission status: Tele admit   Lequita Halt MD [facility] Pager [phone]  06/19/2022, 12:45 PM

## 2022-06-19 NOTE — ED Notes (Signed)
US at bedside

## 2022-06-19 NOTE — ED Notes (Signed)
Patient transported to Ultrasound 

## 2022-06-19 NOTE — Consult Note (Signed)
Urology Consult   Physician requesting consult: Dr. Wynetta Fines, MD  Reason for consult: Gross Hematuria  History of Present Illness: [name] is a 89 y.o. male with history of CAD/CABG, HTN, HLD, TIA and atrial fibrillation on eliquis, elevated PSA, BPH s/p TURP (1992) on finasteride daily, gross hematuria in 2018 who is admitted with chest pain in setting of gross hematuria. The patient reports that he developed gross hematuria 3 weeks ago, was diagnosed with a UTI and was treated with two different antibiotics, last dose yesterday. He notes that his hematuria initially cleared up but then recurred and has been persistent over the last two weeks. He reports intermittently passing clots but notes his urine is thin merlot colored for the most part. He denies incomplete bladder emptying. Initially had dysuria which has resolved with antibiotic treatment. Last dose of Eliquis was yesterday morning.  He developed chest pain over the last three day which worsened overnight which prompted his visit to the ED today.  Labs notable for Hgb 8.1, Cr 1.82. UA with rare bacteria. RUS without hydronephrosis or clot burden within bladder. He received 1 unit of pRBC this afternoon for symptomatic anemia.  He does have an appointment with Dr. Alinda Money on 06/22/22 due to the hematuria and has been followed by Dr. Alinda Money in the past. His gross hematuria workup in 2018 was notable for an enlarged and friable prostate.  Past Medical History:  Diagnosis Date   BPH (benign prostatic hyperplasia)    Coronary atherosclerosis of artery bypass graft 12/20/2013   CASHD with CABG 2000 with LIMA to LAD, SV graft to diagonal, SVG to OM, and saphenous vein graft to distal RCA.      Essential hypertension 12/20/2013   GERD (gastroesophageal reflux disease)    Hyperlipidemia 12/20/2013   Paroxysmal atrial fibrillation (HCC)    Right bundle branch block 12/20/2013   Stroke [facility])     Past Surgical History:  Procedure  Laterality Date   CARDIAC SURGERY  2000   BYPASS   CARDIAC SURGERY     quadruple by pass surgery   HERNIA REPAIR  2005   LOOP RECORDER IMPLANT     PROSTATE SURGERY  1985   TEE WITHOUT CARDIOVERSION N/A 04/02/2014   Procedure: TRANSESOPHAGEAL ECHOCARDIOGRAM (TEE);  Surgeon: Lelon Perla, MD;  Location: [facility] ENDOSCOPY;  Service: Cardiovascular;  Laterality: N/A;    Current Hospital Medications:  Home Meds:  No outpatient medications have been marked as taking for the 06/19/22 encounter [facility] Encounter).    Scheduled Meds:  amLODipine  10 mg Oral Daily   atorvastatin  40 mg Oral QHS   ezetimibe  5 mg Oral QHS   [START ON 06/20/2022] ferrous sulfate  325 mg Oral Q breakfast   finasteride  5 mg Oral Q1200   furosemide  20 mg Intravenous BID   hydrALAZINE  10 mg Oral Q8H   isosorbide mononitrate  30 mg Oral Daily   pantoprazole  20 mg Oral Daily   Continuous Infusions: PRN Meds:.hydrALAZINE, traMADol  Allergies:  Allergies  Allergen Reactions   Monosodium Glutamate Anaphylaxis   Lisinopril Other (See Comments)    Family History  Problem Relation Age of Onset   Hypertension Mother     Social History:  reports that he has never smoked. He has never used smokeless tobacco. He reports current alcohol use of about 1.0 standard drink of alcohol per week. He reports that he does not use drugs.  ROS: A complete review of systems  was performed.  All systems are negative except for pertinent findings as noted.  Physical Exam:  Vital signs in last 24 hours: Temp:  [97.6 F (36.4 C)-98.2 F (36.8 C)] 97.6 F (36.4 C) (03/09 1634) Pulse Rate:  [46-67] 64 (03/09 1634) Resp:  [15-24] 16 (03/09 1613) BP: (139-173)/(46-80) 147/63 (03/09 1634) SpO2:  [90 %-100 %] 92 % (03/09 1634) Weight:  [77 kg] 77 kg (03/09 1634) Constitutional:  Alert and oriented, No acute distress Cardiovascular: Regular rate Respiratory: Normal respiratory effort on room air GI: Abdomen is soft,  nontender, nondistended, no abdominal masses GU: No CVA tenderness, voiding spontaneously Neurologic: Grossly intact, no focal deficits Psychiatric: Normal mood and affect  Laboratory Data:  Recent Labs    06/19/22 0819  WBC 7.0  HGB 8.1*  HCT 24.6*  PLT 258    Recent Labs    06/19/22 0819  NA 142  K 4.0  CL 112*  GLUCOSE 130*  BUN 53*  CALCIUM 8.7*  CREATININE 1.82*     Results for orders placed or performed during the hospital encounter of 06/19/22 (from the past 24 hour(s))  Basic metabolic panel     Status: Abnormal   Collection Time: 06/19/22  8:19 AM  Result Value Ref Range   Sodium 142 135 - 145 mmol/L   Potassium 4.0 3.5 - 5.1 mmol/L   Chloride 112 (H) 98 - 111 mmol/L   CO2 21 (L) 22 - 32 mmol/L   Glucose, Bld 130 (H) 70 - 99 mg/dL   BUN 53 (H) 8 - 23 mg/dL   Creatinine, Ser 1.82 (H) 0.61 - 1.24 mg/dL   Calcium 8.7 (L) 8.9 - 10.3 mg/dL   GFR, Estimated 33 (L) >60 mL/min   Anion gap 9 5 - 15  CBC     Status: Abnormal   Collection Time: 06/19/22  8:19 AM  Result Value Ref Range   WBC 7.0 4.0 - 10.5 K/uL   RBC 2.67 (L) 4.22 - 5.81 MIL/uL   Hemoglobin 8.1 (L) 13.0 - 17.0 g/dL   HCT 24.6 (L) 39.0 - 52.0 %   MCV 92.1 80.0 - 100.0 fL   MCH 30.3 26.0 - 34.0 pg   MCHC 32.9 30.0 - 36.0 g/dL   RDW 17.2 (H) 11.5 - 15.5 %   Platelets 258 150 - 400 K/uL   nRBC 0.0 0.0 - 0.2 %  Troponin I (High Sensitivity)     Status: Abnormal   Collection Time: 06/19/22  8:19 AM  Result Value Ref Range   Troponin I (High Sensitivity) 162 (HH) <18 ng/L  Protime-INR (order if Patient is taking Coumadin / Warfarin)     Status: Abnormal   Collection Time: 06/19/22  8:19 AM  Result Value Ref Range   Prothrombin Time 15.3 (H) 11.4 - 15.2 seconds   INR 1.2 0.8 - 1.2  Brain natriuretic peptide     Status: Abnormal   Collection Time: 06/19/22  8:30 AM  Result Value Ref Range   B Natriuretic Peptide 786.5 (H) 0.0 - 100.0 pg/mL  ABO/Rh     Status: None   Collection Time: 06/19/22   8:45 AM  Result Value Ref Range   ABO/RH(D)      O POS Performed at [facility], [address] [place],  [address]   Type and screen Wall     Status: None (Preliminary result)   Collection Time: 06/19/22  8:50 AM  Result Value Ref Range  ABO/RH(D) O POS    Antibody Screen NEG    Sample Expiration 06/22/2022,2359    Unit Number [id number]    Blood Component Type RED CELLS,LR    Unit division 00    Status of Unit ISSUED    Transfusion Status OK TO TRANSFUSE    Crossmatch Result      Compatible Performed at [facility], [place] [address], [place], [place] [address]   Troponin I (High Sensitivity)     Status: Abnormal   Collection Time: 06/19/22 10:19 AM  Result Value Ref Range   Troponin I (High Sensitivity) 98 (H) <18 ng/L  Urinalysis, Routine w reflex microscopic -Urine, Clean Catch     Status: Abnormal   Collection Time: 06/19/22 10:55 AM  Result Value Ref Range   Color, Urine RED (A) YELLOW   APPearance TURBID (A) CLEAR   Specific Gravity, Urine  1.005 - 1.030    TEST NOT REPORTED DUE TO COLOR INTERFERENCE OF URINE PIGMENT   pH  5.0 - 8.0    TEST NOT REPORTED DUE TO COLOR INTERFERENCE OF URINE PIGMENT   Glucose, UA (A) NEGATIVE mg/dL    TEST NOT REPORTED DUE TO COLOR INTERFERENCE OF URINE PIGMENT   Hgb urine dipstick (A) NEGATIVE    TEST NOT REPORTED DUE TO COLOR INTERFERENCE OF URINE PIGMENT   Bilirubin Urine (A) NEGATIVE    TEST NOT REPORTED DUE TO COLOR INTERFERENCE OF URINE PIGMENT   Ketones, ur (A) NEGATIVE mg/dL    TEST NOT REPORTED DUE TO COLOR INTERFERENCE OF URINE PIGMENT   Protein, ur (A) NEGATIVE mg/dL    TEST NOT REPORTED DUE TO COLOR INTERFERENCE OF URINE PIGMENT   Nitrite (A) NEGATIVE    TEST NOT REPORTED DUE TO COLOR INTERFERENCE OF URINE PIGMENT   Leukocytes,Ua (A) NEGATIVE    TEST NOT REPORTED DUE TO COLOR INTERFERENCE OF URINE PIGMENT  Urinalysis, Microscopic (reflex)     Status: Abnormal   Collection  Time: 06/19/22 10:55 AM  Result Value Ref Range   RBC / HPF >50 0 - 5 RBC/hpf   WBC, UA 6-10 0 - 5 WBC/hpf   Bacteria, UA RARE (A) NONE SEEN   Squamous Epithelial / HPF 0-5 0 - 5 /HPF  Prepare RBC (crossmatch)     Status: None   Collection Time: 06/19/22  1:30 PM  Result Value Ref Range   Order Confirmation      ORDER PROCESSED BY BLOOD BANK Performed at [facility], [place] [address], [place], [place] [address]    No results found for this or any previous visit (from the past 240 hour(s)).  Renal Function: Recent Labs    06/19/22 0819  CREATININE 1.82*   Estimated Creatinine Clearance: 24.7 mL/min (A) (by C-G formula based on SCr of 1.82 mg/dL (H)).  Radiologic Imaging: US RENAL  Result Date: 06/19/2022 CLINICAL DATA:  Hematuria EXAM: RENAL / URINARY TRACT ULTRASOUND COMPLETE COMPARISON:  CT abdomen pelvis 01/18/2019 FINDINGS: Right Kidney: Renal measurements: 9.5 x 5.3 x 5.3 cm = volume: 140.6 mL. Renal cortical thinning. Increased cortical echogenicity. No hydronephrosis. Multiple cysts within the right kidney measuring up to 2.7 cm. Left Kidney: Renal measurements: 10.9 x 4.6 x 5.3 cm = volume: 137.8 mL. Renal cortical thinning. Increased cortical echogenicity. No hydronephrosis. Multiple cysts measuring up to 2.6 cm in the midpole. Bladder: Suggestion of echogenic layering debris within the urinary bladder lumen. Other: Prostate is markedly enlarged and impresses upon the bladder base. IMPRESSION: 1.  No hydronephrosis. 2. Bilateral renal cortical thinning and increased cortical echogenicity suggestive of chronic medical renal disease. 3. Suggestion of echogenic layering debris within the urinary bladder lumen. Correlate with urinalysis. 4. Markedly enlarged prostate. Electronically Signed   By: Lovey Newcomer M.D.   On: 06/19/2022 13:48   DG Chest 2 View  Result Date: 06/19/2022 CLINICAL DATA:  Chest pain EXAM: CHEST - 2 VIEW COMPARISON:  12/04/2014 FINDINGS: Telemetry leads  overlie the chest. Cardiomegaly, CABG changes and cardiac recorder again noted. On the view, LOWER lobe airspace disease and consolidation/atelectasis noted. A probable small effusion is present. There is no evidence of pneumothorax or acute bony abnormality. IMPRESSION: LOWER lobe airspace disease/pneumonia with consolidation/atelectasis with probable small pleural effusion. Electronically Signed   By: Margarette Canada M.D.   On: 06/19/2022 09:25    I independently reviewed the above imaging studies.  Impression/Recommendation 89 year old male with history of CAD/CABG, HTN, HLD, TIA and atrial fibrillation on eliquis, elevated PSA, BPH s/p TURP (1992) on finasteride daily, gross hematuria in 2018 who is admitted with chest pain in setting of gross hematuria.  He is afebrile and stable. Hgb 8.1, now s/p 1u pRBC this afternoon for symptomatic anemia. Cr 1.8 (baseline 1.6). UA with rare bacteria. No urine culture pending. S/p recent 2 courses of antibiotics for UTI. RUS without hydronephrosis or clot burden within bladder.  Patient voiding spontaneously. Per report, urine thin merlot colored with minimal clot.  - No indication for acute urologic intervention at this time. - Continue holding eliquis until hematuria improves if ok from medical standpoint.  - Please rack and record urine so urology can assess urine color at bedside.  - Recommend qshift PVR to ensure patient is emptying bladder appropriately. - Continue to trend Hgb. Transfusions per primary team.  - Patient has follow up appointment with Dr. Alinda Money on 06/22/22. If patient is still hospitalized, we will reschedule his appointment. He will need outpatient hematuria work up.   Coralyn Pear, MD [facility] 06/19/2022, 5:34 PM

## 2022-06-19 NOTE — ED Triage Notes (Signed)
Pt BIBGEMS from home for cp that started at 0300 anterior chest and epigastric. Took tums twice with some relief. Able to sleep and then woke up again with pain took a nitro then went to bed. Awoke again with same pain, took another nitro.    2/10 dull ache  Hx cabbage  Current UTI - 2 doses abx, blood in urine  ASA 325 mg 0500  18 L forearm  170/66 55 hr 94 % RA 191 CBG

## 2022-06-20 ENCOUNTER — Other Ambulatory Visit (HOSPITAL_COMMUNITY): Payer: No Typology Code available for payment source

## 2022-06-20 ENCOUNTER — Inpatient Hospital Stay (HOSPITAL_COMMUNITY): Payer: Medicare Other

## 2022-06-20 DIAGNOSIS — I5031 Acute diastolic (congestive) heart failure: Secondary | ICD-10-CM

## 2022-06-20 DIAGNOSIS — I5033 Acute on chronic diastolic (congestive) heart failure: Secondary | ICD-10-CM

## 2022-06-20 DIAGNOSIS — D500 Iron deficiency anemia secondary to blood loss (chronic): Secondary | ICD-10-CM

## 2022-06-20 DIAGNOSIS — I25708 Atherosclerosis of coronary artery bypass graft(s), unspecified, with other forms of angina pectoris: Secondary | ICD-10-CM

## 2022-06-20 DIAGNOSIS — I480 Paroxysmal atrial fibrillation: Secondary | ICD-10-CM

## 2022-06-20 DIAGNOSIS — I10 Essential (primary) hypertension: Secondary | ICD-10-CM

## 2022-06-20 LAB — CBC
HCT: 26 % — ABNORMAL LOW (ref 39.0–52.0)
Hemoglobin: 8.6 g/dL — ABNORMAL LOW (ref 13.0–17.0)
MCH: 29.9 pg (ref 26.0–34.0)
MCHC: 33.1 g/dL (ref 30.0–36.0)
MCV: 90.3 fL (ref 80.0–100.0)
Platelets: 223 10*3/uL (ref 150–400)
RBC: 2.88 MIL/uL — ABNORMAL LOW (ref 4.22–5.81)
RDW: 16.6 % — ABNORMAL HIGH (ref 11.5–15.5)
WBC: 8.1 10*3/uL (ref 4.0–10.5)
nRBC: 0 % (ref 0.0–0.2)

## 2022-06-20 LAB — TYPE AND SCREEN
ABO/RH(D): O POS
Antibody Screen: NEGATIVE
Unit division: 0

## 2022-06-20 LAB — ECHOCARDIOGRAM COMPLETE
AR max vel: 2.21 cm2
AV Area VTI: 2.01 cm2
AV Area mean vel: 2.23 cm2
AV Mean grad: 4 mmHg
AV Peak grad: 8 mmHg
Ao pk vel: 1.41 m/s
Area-P 1/2: 4.3 cm2
Height: 71 in
S' Lateral: 3.1 cm
Weight: 2719.59 oz

## 2022-06-20 LAB — BASIC METABOLIC PANEL
Anion gap: 11 (ref 5–15)
BUN: 50 mg/dL — ABNORMAL HIGH (ref 8–23)
CO2: 20 mmol/L — ABNORMAL LOW (ref 22–32)
Calcium: 8.5 mg/dL — ABNORMAL LOW (ref 8.9–10.3)
Chloride: 112 mmol/L — ABNORMAL HIGH (ref 98–111)
Creatinine, Ser: 1.71 mg/dL — ABNORMAL HIGH (ref 0.61–1.24)
GFR, Estimated: 36 mL/min — ABNORMAL LOW (ref >60)
Glucose, Bld: 115 mg/dL — ABNORMAL HIGH (ref 70–99)
Potassium: 3.8 mmol/L (ref 3.5–5.1)
Sodium: 143 mmol/L (ref 135–145)

## 2022-06-20 LAB — BPAM RBC
Blood Product Expiration Date: 202404012359
ISSUE DATE / TIME: 202403091344
Unit Type and Rh: 5100

## 2022-06-20 LAB — TROPONIN I (HIGH SENSITIVITY): Troponin I (High Sensitivity): 877 ng/L (ref <18)

## 2022-06-20 MED ORDER — FUROSEMIDE 10 MG/ML IJ SOLN
40.0000 mg | Freq: Once | INTRAMUSCULAR | Status: AC
  Administered 2022-06-20: 40 mg via INTRAVENOUS
  Filled 2022-06-20: qty 4

## 2022-06-20 MED ORDER — HYDRALAZINE HCL 25 MG PO TABS
25.0000 mg | ORAL_TABLET | Freq: Two times a day (BID) | ORAL | Status: DC
  Administered 2022-06-20 – 2022-06-21 (×2): 25 mg via ORAL
  Filled 2022-06-20 (×2): qty 1

## 2022-06-20 MED ORDER — NITROGLYCERIN 0.4 MG SL SUBL
0.4000 mg | SUBLINGUAL_TABLET | SUBLINGUAL | Status: DC | PRN
  Administered 2022-06-22: 0.4 mg via SUBLINGUAL
  Filled 2022-06-20 (×2): qty 1

## 2022-06-20 MED ORDER — DEGARELIX ACETATE(240 MG DOSE) 120 MG/VIAL ~~LOC~~ SOLR
240.0000 mg | Freq: Once | SUBCUTANEOUS | Status: AC
  Administered 2022-06-20: 240 mg via SUBCUTANEOUS
  Filled 2022-06-20: qty 6

## 2022-06-20 MED ORDER — FUROSEMIDE 10 MG/ML IJ SOLN
60.0000 mg | Freq: Two times a day (BID) | INTRAMUSCULAR | Status: DC
  Administered 2022-06-20: 60 mg via INTRAVENOUS
  Filled 2022-06-20 (×2): qty 6

## 2022-06-20 NOTE — Assessment & Plan Note (Addendum)
Iron panel with serum iron at 18, TIBC 262, transferrin saturation 7 and ferritin 38, Plan for IV iron today.

## 2022-06-20 NOTE — Assessment & Plan Note (Signed)
Continue blood pressure control with amlodipine, hydralazine and isosorbide.

## 2022-06-20 NOTE — Assessment & Plan Note (Signed)
Today with no chest pain. Demand ischemia.   Plan to continue close blood pressure monitoring. Keep hgb 8 or above. No anticoagulation due to active hematuria.  Continue with atorvastatin.

## 2022-06-20 NOTE — Progress Notes (Signed)
DAILY PROGRESS NOTE   Patient Name: [name] Date of Encounter: 06/20/2022 Cardiologist: Sinclair Grooms, MD (Inactive)  Chief Complaint   No chest pain today  Patient Profile   89 yo male with history of CAD and prior CABG x 4 in 2000, presents with chest and hematuria.   Subjective   No further chest pain, however, troponin trended up to 877. Was transfused 1 unit yesterday, with less than expected response. Hemoglobin remains above 8. Seen by urology - they are considering anti-androgen therapy to help with bleeding. Will still hold anticoagulation. Also noted to have slow afib, both asleep and awake - no AV nodal blockers. He is asymptomatic from this.  Objective   Vitals:   06/20/22 0022 06/20/22 0428 06/20/22 0620 06/20/22 0853  BP: 138/62 (!) 129/46 (!) 146/50 (!) 122/43  Pulse: (!) 52 (!) 55  (!) 59  Resp: (!) 21   20  Temp: 98 F (36.7 C) 97.8 F (36.6 C)  (!) 97.4 F (36.3 C)  TempSrc: Oral Oral  Oral  SpO2: 92% 93%  93%  Weight: 77.1 kg     Height:        Intake/Output Summary (Last 24 hours) at 06/20/2022 1108 Last data filed at 06/20/2022 1000 Gross per 24 hour  Intake 902 ml  Output 1630 ml  Net -728 ml   Filed Weights   06/19/22 1634 06/20/22 0022  Weight: 77 kg 77.1 kg    Physical Exam   General appearance: alert and no distress Lungs: clear to auscultation bilaterally Heart: irregularly irregular, bradycardic Extremities: extremities normal, atraumatic, no cyanosis or edema Neurologic: Grossly normal  Inpatient Medications    Scheduled Meds:  amLODipine  10 mg Oral Daily   atorvastatin  40 mg Oral QHS   degarelix  240 mg Subcutaneous Once   ezetimibe  5 mg Oral QHS   ferrous sulfate  325 mg Oral Q breakfast   finasteride  5 mg Oral Q1200   furosemide  20 mg Intravenous BID   hydrALAZINE  10 mg Oral Q8H   isosorbide mononitrate  30 mg Oral Daily   latanoprost  1 drop Both Eyes QHS   pantoprazole  20 mg Oral Daily    timolol  1 drop Right Eye BID    Continuous Infusions:   PRN Meds: hydrALAZINE, traMADol   Labs   Results for orders placed or performed during the hospital encounter of 06/19/22 (from the past 48 hour(s))  Basic metabolic panel     Status: Abnormal   Collection Time: 06/19/22  8:19 AM  Result Value Ref Range   Sodium 142 135 - 145 mmol/L   Potassium 4.0 3.5 - 5.1 mmol/L   Chloride 112 (H) 98 - 111 mmol/L   CO2 21 (L) 22 - 32 mmol/L   Glucose, Bld 130 (H) 70 - 99 mg/dL    Comment: Glucose reference range applies only to samples taken after fasting for at least 8 hours.   BUN 53 (H) 8 - 23 mg/dL   Creatinine, Ser 1.82 (H) 0.61 - 1.24 mg/dL   Calcium 8.7 (L) 8.9 - 10.3 mg/dL   GFR, Estimated 33 (L) >60 mL/min    Comment: (NOTE) Calculated using the CKD-EPI Creatinine Equation (2021)    Anion gap 9 5 - 15    Comment: Performed at [facility] [address] [place], [place] [address]  CBC     Status: Abnormal   Collection Time: 06/19/22  8:19 AM  Result Value Ref Range   WBC 7.0 4.0 - 10.5 K/uL   RBC 2.67 (L) 4.22 - 5.81 MIL/uL   Hemoglobin 8.1 (L) 13.0 - 17.0 g/dL   HCT 24.6 (L) 39.0 - 52.0 %   MCV 92.1 80.0 - 100.0 fL   MCH 30.3 26.0 - 34.0 pg   MCHC 32.9 30.0 - 36.0 g/dL   RDW 17.2 (H) 11.5 - 15.5 %   Platelets 258 150 - 400 K/uL   nRBC 0.0 0.0 - 0.2 %    Comment: Performed at [facility] [address] [place], [place] [address]  Troponin I (High Sensitivity)     Status: Abnormal   Collection Time: 06/19/22  8:19 AM  Result Value Ref Range   Troponin I (High Sensitivity) 162 (HH) <18 ng/L    Comment: CRITICAL RESULT CALLED TO, READ BACK BY AND VERIFIED WITH E,ANELLO RN '@0920'$  06/19/22 E,BENTON (NOTE) Elevated high sensitivity troponin I (hsTnI) values and significant  changes across serial measurements may suggest ACS but many other  chronic and acute conditions are known to elevate hsTnI results.  Refer to the "Links" section for chest pain  algorithms and additional  guidance. Performed at [facility], [place] [address] [place], [place] [address]   Protime-INR (order if Patient is taking Coumadin / Warfarin)     Status: Abnormal   Collection Time: 06/19/22  8:19 AM  Result Value Ref Range   Prothrombin Time 15.3 (H) 11.4 - 15.2 seconds   INR 1.2 0.8 - 1.2    Comment: (NOTE) INR goal varies based on device and disease states. Performed at [facility], [place] [address], [place], [place] [address]   Brain natriuretic peptide     Status: Abnormal   Collection Time: 06/19/22  8:30 AM  Result Value Ref Range   B Natriuretic Peptide 786.5 (H) 0.0 - 100.0 pg/mL    Comment: Performed at [facility] [address] [place], [place] [address]  ABO/Rh     Status: None   Collection Time: 06/19/22  8:45 AM  Result Value Ref Range   ABO/RH(D)      O POS Performed at [facility] [address] [place], [place] [address]   Type and screen [place]     Status: None (Preliminary result)   Collection Time: 06/19/22  8:50 AM  Result Value Ref Range   ABO/RH(D) O POS    Antibody Screen NEG    Sample Expiration 06/22/2022,2359    Unit Number [id number]    Blood Component Type RED CELLS,LR    Unit division 00    Status of Unit ISSUED    Transfusion Status OK TO TRANSFUSE    Crossmatch Result      Compatible Performed at [facility], [place] [address] [place], [place] [address]   Troponin I (High Sensitivity)     Status: Abnormal   Collection Time: 06/19/22 10:19 AM  Result Value Ref Range   Troponin I (High Sensitivity) 98 (H) <18 ng/L    Comment: DELTA CHECK NOTED (NOTE) Elevated high sensitivity troponin I (hsTnI) values and significant  changes across serial measurements may suggest ACS but many other  chronic and acute conditions are known to elevate hsTnI results.  Refer to the "Links" section for chest pain algorithms and additional  guidance. Performed at [facility], [place] [address], [place], [place] [address]  Urinalysis, Routine w reflex microscopic -Urine, Clean Catch     Status: Abnormal   Collection Time: 06/19/22 10:55 AM  Result Value Ref Range   Color, Urine RED (A) YELLOW    Comment: MICROSCOPIC EXAM PERFORMED ON UNCONCENTRATED URINE BIOCHEMICALS MAY BE AFFECTED BY COLOR    APPearance TURBID (A) CLEAR    Comment: Grossly Bloody   Specific Gravity, Urine  1.005 - 1.030    TEST NOT REPORTED DUE TO COLOR INTERFERENCE OF URINE PIGMENT   pH  5.0 - 8.0    TEST NOT REPORTED DUE TO COLOR INTERFERENCE OF URINE PIGMENT   Glucose, UA (A) NEGATIVE mg/dL    TEST NOT REPORTED DUE TO COLOR INTERFERENCE OF URINE PIGMENT    Comment: TEST NOT REPORTED DUE TO COLOR INTERFERENCE OF URINE PIGMENT   Hgb urine dipstick (A) NEGATIVE    TEST NOT REPORTED DUE TO COLOR INTERFERENCE OF URINE PIGMENT   Bilirubin Urine (A) NEGATIVE    TEST NOT REPORTED DUE TO COLOR INTERFERENCE OF URINE PIGMENT   Ketones, ur (A) NEGATIVE mg/dL    TEST NOT REPORTED DUE TO COLOR INTERFERENCE OF URINE PIGMENT   Protein, ur (A) NEGATIVE mg/dL    TEST NOT REPORTED DUE TO COLOR INTERFERENCE OF URINE PIGMENT   Nitrite (A) NEGATIVE    TEST NOT REPORTED DUE TO COLOR INTERFERENCE OF URINE PIGMENT   Leukocytes,Ua (A) NEGATIVE    TEST NOT REPORTED DUE TO COLOR INTERFERENCE OF URINE PIGMENT    Comment: Performed at [facility], [place] [address], [place], [place] [address]  Urinalysis, Microscopic (reflex)     Status: Abnormal   Collection Time: 06/19/22 10:55 AM  Result Value Ref Range   RBC / HPF >50 0 - 5 RBC/hpf   WBC, UA 6-10 0 - 5 WBC/hpf   Bacteria, UA RARE (A) NONE SEEN   Squamous Epithelial / HPF 0-5 0 - 5 /HPF    Comment: Performed at [facility], [place] [address] [place], [place] [address]  Prepare RBC (crossmatch)     Status: None   Collection Time: 06/19/22  1:30 PM  Result Value Ref Range   Order Confirmation      ORDER PROCESSED BY BLOOD  BANK Performed at [facility], [place] [address] [place],  [id number]   Basic metabolic panel     Status: Abnormal   Collection Time: 06/20/22 12:50 AM  Result Value Ref Range   Sodium 143 135 - 145 mmol/L   Potassium 3.8 3.5 - 5.1 mmol/L   Chloride 112 (H) 98 - 111 mmol/L   CO2 20 (L) 22 - 32 mmol/L   Glucose, Bld 115 (H) 70 - 99 mg/dL    Comment: Glucose reference range applies only to samples taken after fasting for at least 8 hours.   BUN 50 (H) 8 - 23 mg/dL   Creatinine, Ser 1.71 (H) 0.61 - 1.24 mg/dL   Calcium 8.5 (L) 8.9 - 10.3 mg/dL   GFR, Estimated 36 (L) >60 mL/min    Comment: (NOTE) Calculated using the CKD-EPI Creatinine Equation (2021)    Anion gap 11 5 - 15    Comment: Performed at [facility] [address], [place] [address]  CBC     Status: Abnormal   Collection Time: 06/20/22 12:50 AM  Result Value Ref Range   WBC 8.1 4.0 - 10.5 K/uL   RBC 2.88 (L) 4.22 - 5.81 MIL/uL   Hemoglobin 8.6 (L)  13.0 - 17.0 g/dL   HCT 26.0 (L) 39.0 - 52.0 %   MCV 90.3 80.0 - 100.0 fL   MCH 29.9 26.0 - 34.0 pg   MCHC 33.1 30.0 - 36.0 g/dL   RDW 16.6 (H) 11.5 - 15.5 %   Platelets 223 150 - 400 K/uL   nRBC 0.0 0.0 - 0.2 %    Comment: Performed at [facility] [address], [place], [place] [address]  Troponin I (High Sensitivity)     Status: Abnormal   Collection Time: 06/20/22  5:43 AM  Result Value Ref Range   Troponin I (High Sensitivity) 877 (HH) <18 ng/L    Comment: CRITICAL RESULT CALLED TO, READ BACK BY AND VERIFIED WITH  D. RAMAH, RN AT [id number] ON 06/20/22 BY H. HOWARD. (NOTE) Elevated high sensitivity troponin I (hsTnI) values and significant  changes across serial measurements may suggest ACS but many other  chronic and acute conditions are known to elevate hsTnI results.  Refer to the "Links" section for chest pain algorithms and additional  guidance. Performed at [facility], [place] [address], [place],  [address]      ECG   N/A  Telemetry   Afib with slow VR - Personally Reviewed  Radiology    US RENAL  Result Date: 06/19/2022 CLINICAL DATA:  Hematuria EXAM: RENAL / URINARY TRACT ULTRASOUND COMPLETE COMPARISON:  CT abdomen pelvis 01/18/2019 FINDINGS: Right Kidney: Renal measurements: 9.5 x 5.3 x 5.3 cm = volume: 140.6 mL. Renal cortical thinning. Increased cortical echogenicity. No hydronephrosis. Multiple cysts within the right kidney measuring up to 2.7 cm. Left Kidney: Renal measurements: 10.9 x 4.6 x 5.3 cm = volume: 137.8 mL. Renal cortical thinning. Increased cortical echogenicity. No hydronephrosis. Multiple cysts measuring up to 2.6 cm in the midpole. Bladder: Suggestion of echogenic layering debris within the urinary bladder lumen. Other: Prostate is markedly enlarged and impresses upon the bladder base. IMPRESSION: 1. No hydronephrosis. 2. Bilateral renal cortical thinning and increased cortical echogenicity suggestive of chronic medical renal disease. 3. Suggestion of echogenic layering debris within the urinary bladder lumen. Correlate with urinalysis. 4. Markedly enlarged prostate. Electronically Signed   By: Lovey Newcomer M.D.   On: 06/19/2022 13:48   DG Chest 2 View  Result Date: 06/19/2022 CLINICAL DATA:  Chest pain EXAM: CHEST - 2 VIEW COMPARISON:  12/04/2014 FINDINGS: Telemetry leads overlie the chest. Cardiomegaly, CABG changes and cardiac recorder again noted. On the view, LOWER lobe airspace disease and consolidation/atelectasis noted. A probable small effusion is present. There is no evidence of pneumothorax or acute bony abnormality. IMPRESSION: LOWER lobe airspace disease/pneumonia with consolidation/atelectasis with probable small pleural effusion. Electronically Signed   By: Margarette Canada M.D.   On: 06/19/2022 09:25    Cardiac Studies   Echo pending  Assessment   Principal Problem:   Hematuria Active Problems:   Chest pain   Iron deficiency anemia due to chronic blood loss    Acute on chronic diastolic CHF (congestive heart failure) (HCC)   CHF (congestive heart failure) (HCC)   Demand ischemia   Plan   No more chest pain, but troponin trended up - likely demand ischemia given known CAD and hematuria. Keep Hb > 8. Ok to give nitro PRN for chest pain if SBP>90. Having some slow afib - no AVN blockers, but on timolol eye gtts - rarely this can affect conduction - will d/c. Monitor telemetry. Echo pending today.  Time Spent Directly with Patient:  I have spent a total of 25 minutes with the patient reviewing hospital notes, telemetry, EKGs, labs and examining the patient as well as establishing an assessment and plan that was discussed personally with the patient.  > 50% of time was spent in direct patient care.  Length of Stay:  LOS: 1 day   Pixie Casino, MD, [facility], [place] Director of the Advanced Lipid Disorders &  Cardiovascular Risk Reduction Clinic Diplomate of the American Board of Clinical Lipidology Attending Cardiologist  Direct Dial: [phone]  Fax: [phone]  Website:  [email or web address] Pointer 06/20/2022, 11:08 AM

## 2022-06-20 NOTE — Assessment & Plan Note (Signed)
Acute blood loss anemia due to significant bleed likely from massive BPH changes exacerbated by recent cystitis.   Plan for fast acting androgen deprivation with [name] per urology recommendations.  Continue to monitor for signs of urinary retention. Follow up with Urology recommendations.

## 2022-06-20 NOTE — Hospital Course (Addendum)
Mr. Mayner was admitted to the hospital with the working diagnosis of hematuria and chest pain.   89 yo male with the past medical history of coronary artery disease, sp CABG 1990's, hypertension, paroxysmal atrial fibrillation, CKD and BPH sp TURP who presented with hematuria, dyspnea and chest pain. Reported 2 days of dyspnea on exertion, and lower extremity edema, with the night prior experiencing chest pressure like pain, that prompted him to come to the ED. He had 2 episodes of urinary tract infection, associated with hematuria, treated as outpatient with oral antibiotics. Hematuria has been persistent for the last 7 days. On his initial physical examination his blood pressure was 170/68, HR 52, RR 21 and 02 saturation 92%, lungs with rales bilaterally, with no wheezing but increased work of breathing, heart with S1 and S2 present and rhythmic, abdomen with no distention or tenderness, positive lower extremity edema.   Na 142, K 4,0 CL 112 bicarbonate 12, glucose 130 bun 53 cr 1,82  BNP 786 High sensitive troponin 162, 98, 877  Wbc 7,0 hgb 8,1 plt 258  Urine analysis with hematuria, not able to measure other parameters.   Chest radiograph with cardiomegaly, bilateral hilar vascular congestion, cephalization of the vasculature, fluid in the right fissure, left lower lobe atelectasis with small pleural effusion.   EKG 56 bpn, normal axis, normal qtc, right bundle branch block, atrial fibrillation rhythm, with ST depressions lead II, III, AvF, no significant T wave changes.    Patient received 2 units PRBC transfusion and was placed on IV furosemide for diuresis.  Urology was consulted for hematuria.   03/11 improved volume status and Hgb stable.  03/12 no further hematuria.

## 2022-06-20 NOTE — Assessment & Plan Note (Addendum)
Rate controlled atrial fibrillation. Continue telemetry monitoring. No anticoagulation due to acute blood loss anemia, due to hematuria.  Pending recommendations when apixaban can be resumed.

## 2022-06-20 NOTE — Progress Notes (Signed)
Progress Note   Patient: [name] [id number] DOB: Nov 23, 1922 DOA: 06/19/2022     1 DOS: the patient was seen and examined on 06/20/2022   Brief hospital course: [name] was admitted to the hospital with the working diagnosis of hematuria and chest pain.   89 yo male with the past medical history of coronary artery disease, sp CABG 1990's, hypertension, paroxysmal atrial fibrillation, CKD and BPH sp TURP who presented with hematuria, dyspnea and chest pain. Reported 2 days of dyspnea on exertion, and lower extremity edema, with the night prior experiencing chest pressure like pain, that prompted him to come to the ED. He had 2 episodes of urinary tract infection, associated with hematuria, treated as outpatient with oral antibiotics. Hematuria has been persistent for the last 7 days. On his initial physical examination his blood pressure was 170/68, HR 52, RR 21 and 02 saturation 92%, lungs with rales bilaterally, with no wheezing but increased work of breathing, heart with S1 and S2 present and rhythmic, abdomen with no distention or tenderness, positive lower extremity edema.   Na 142, K 4,0 CL 112 bicarbonate 12, glucose 130 bun 53 cr 1,82  BNP 786 High sensitive troponin 162, 98, 877  Wbc 7,0 hgb 8,1 plt 258  Urine analysis with hematuria, not able to measure other parameters.   Chest radiograph with cardiomegaly, bilateral hilar vascular congestion, cephalization of the vasculature, fluid in the right fissure, left lower lobe atelectasis with small pleural effusion.   EKG 56 bpn, normal axis, normal qtc, right bundle branch block, atrial fibrillation rhythm, with ST depressions lead II, III, AvF, no significant T wave changes.    Patient received 2 units PRBC transfusion and was placed on IV furosemide for diuresis.  Urology was consulted for hematuria.   Assessment and Plan: * Acute on chronic diastolic CHF (congestive heart failure) (HCC) Echocardiogram from 2016 with  preserved LV systolic function. Moderate TR and pulmonary hypertension.   Urine output is documented at [id number]  Systolic blood pressure is 146 to 122 mmHg.   Plan to continue diuresis with furosemide, will increase to 60 mg IV q12 hrs Continue amlodipine for blood pressure control.  After load reduction with hydralazine and isosorbide.  Limited pharmacological options due to reduced GFR.   Elevated troponin due to demand ischemia and heart failure decompensation.    Coronary atherosclerosis of artery bypass graft Today with no chest pain. Demand ischemia.   Plan to continue close blood pressure monitoring. Keep hgb 8 or above. No anticoagulation due to active hematuria.  Continue with atorvastatin.   Hematuria Acute blood loss anemia due to significant bleed likely from massive BPH changes exacerbated by recent cystitis.   Plan for fast acting androgen deprivation with [name] per urology recommendations.  Continue to monitor for signs of urinary retention. Follow up with Urology recommendations.   Paroxysmal atrial fibrillation (HCC) Rate controlled atrial fibrillation. Continue telemetry monitoring. No anticoagulation due to acute blood loss anemia, due to hematuria.   Essential hypertension Continue blood pressure control with amlodipine, hydralazine and isosorbide.   Iron deficiency anemia due to chronic blood loss Check iron panel.  Follow up on Hgb and hct       Subjective: Patient with improvement in his symptoms but not back to baseline, no chest pain, continue to have hematuria but no abdominal pain.   Physical Exam: Vitals:   06/20/22 0428 06/20/22 0620 06/20/22 0853 06/20/22 1222  BP: (!) 129/46 (!) 146/50 (!) 122/43 (!) 141/53  Pulse: (!) 55  (!) 59 (!) 56  Resp:   20 19  Temp: 97.8 F (36.6 C)  (!) 97.4 F (36.3 C) 97.7 F (36.5 C)  TempSrc: Oral  Oral Oral  SpO2: 93%  93% 94%  Weight:      Height:       Neurology awake and alert ENT with  mild pallor, no icterus Cardiovascular with S1 and S2 present irregularly irregular with systolic murmur at the lower right sternal border, with no gallops or rubs Respiratory with rales at bases with no wheezing or rhonchi Abdomen with no distention or tenderness  Data Reviewed:    Family Communication: no family at the bedside. I spoke with patient's son over the phone we talked in detail about patient's condition, plan of care and prognosis and all questions were addressed.    Disposition: Status is: Inpatient Remains inpatient appropriate because: hematuria and heart failure   Planned Discharge Destination: Home     Author: Tawni Millers, MD 06/20/2022 12:24 PM  For on call review [email or web address].

## 2022-06-20 NOTE — Progress Notes (Signed)
Subjective/Chief Complaint:  S: 1- Massive Prostatic Hypertrophy - 180gm prostate with large medain by prior CT s/p TURP decades ago and on finasteride at baseline. No retention.  2 - Gross Hematuria / Cystitis - recent cystitis treated medcially, CX not avail for reviw. UA here 3/9 after treatmetn w/o sig bacteruria. He does get occasional hematurai form prostate. CT 2020 with massive prostate, no upper tract masses.  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  Today "[name]" is stable. Hematuria reamisn thin, s/p 1 upRBC, TropI trending up.   Objective: Vital signs in last 24 hours: Temp:  [97.4 F (36.3 C)-98.2 F (36.8 C)] 97.4 F (36.3 C) (03/10 0853) Pulse Rate:  [46-67] 59 (03/10 0853) Resp:  [16-24] 20 (03/10 0853) BP: (122-164)/(43-80) 122/43 (03/10 0853) SpO2:  [92 %-100 %] 93 % (03/10 0853) Weight:  [77 kg-77.1 kg] 77.1 kg (03/10 0022) Last BM Date : 06/19/22  Intake/Output from previous day: 03/09 0701 - 03/10 0700 In: 902 [P.O.:587; Blood:315] Out: 1030 [Urine:1030] Intake/Output this shift: Total I/O In: -  Out: 600 [Urine:600]  NAD, very pleasant and spry for age Non-labored breathing on RA SNTND No foley  Racked urine with thin / bloody urine w.o clots  PVR <127m TropI 800  Lab Results:  Recent Labs    06/19/22 0819 06/20/22 0050  WBC 7.0 8.1  HGB 8.1* 8.6*  HCT 24.6* 26.0*  PLT 258 223   BMET Recent Labs    06/19/22 0819 06/20/22 0050  NA 142 143  K 4.0 3.8  CL 112* 112*  CO2 21* 20*  GLUCOSE 130* 115*  BUN 53* 50*  CREATININE 1.82* 1.71*  CALCIUM 8.7* 8.5*   PT/INR Recent Labs    06/19/22 0819  LABPROT 15.3*  INR 1.2   ABG No results for input(s): "PHART", "HCO3" in the last 72 hours.  Invalid input(s): "PCO2", "PO2"  Studies/Results: [place]RENAL  Result Date: 06/19/2022 CLINICAL DATA:  Hematuria EXAM: RENAL / URINARY TRACT ULTRASOUND  COMPLETE COMPARISON:  CT abdomen pelvis 01/18/2019 FINDINGS: Right Kidney: Renal measurements: 9.5 x 5.3 x 5.3 cm = volume: 140.6 mL. Renal cortical thinning. Increased cortical echogenicity. No hydronephrosis. Multiple cysts within the right kidney measuring up to 2.7 cm. Left Kidney: Renal measurements: 10.9 x 4.6 x 5.3 cm = volume: 137.8 mL. Renal cortical thinning. Increased cortical echogenicity. No hydronephrosis. Multiple cysts measuring up to 2.6 cm in the midpole. Bladder: Suggestion of echogenic layering debris within the urinary bladder lumen. Other: Prostate is markedly enlarged and impresses upon the bladder base. IMPRESSION: 1. No hydronephrosis. 2. Bilateral renal cortical thinning and increased cortical echogenicity suggestive of chronic medical renal disease. 3. Suggestion of echogenic layering debris within the urinary bladder lumen. Correlate with urinalysis. 4. Markedly enlarged prostate. Electronically Signed   By: DLovey NewcomerM.D.   On: 06/19/2022 13:48   DG Chest 2 View  Result Date: 06/19/2022 CLINICAL DATA:  Chest pain EXAM: CHEST - 2 VIEW COMPARISON:  12/04/2014 FINDINGS: Telemetry leads overlie the chest. Cardiomegaly, CABG changes and cardiac recorder again noted. On the view, LOWER lobe airspace disease and consolidation/atelectasis noted. A probable small effusion is present. There is no evidence of pneumothorax or acute bony abnormality. IMPRESSION: LOWER lobe airspace disease/pneumonia with consolidation/atelectasis with probable small pleural effusion. Electronically Signed   By: JMargarette CanadaM.D.   On: 06/19/2022 09:25    Anti-infectives: Anti-infectives (From admission, onward)  None       Assessment/Plan:  Difficult situation with large hemodynamically significant bleed likely from massive BPH changes exacerbated by recent cystitis. He is not great operative candidate at his age.We agree on trial of fast-acting androgen deprivaiton with Firmagon loadaing dose. If  develops frank clot retention, then will need OR cysto / clot evac / fulgeration. Consider prostate embolization if refracotry to that.  Agree with medical manamgent, transfusion, avoid strong blood thinners and discussed with cards team.   [name] 06/20/2022

## 2022-06-20 NOTE — Assessment & Plan Note (Addendum)
Echocardiogram with preserved LV systolic function with EF 60 to 65%, moderate LVH, ventricular septum is flattened in systole and diastole, RV systolic function with moderate reduction, RV cavity with severe enlargement, RVSP 76.2 mmHg, RA with severe enlargement, severe tricuspid regurgitation.   Acute on chronic core pulmonale. Acute on chronic RV failure. Chronic pulmonary hypertension.   Urine output is documented at 650 ml.  Systolic blood pressure is 113 to 129 mmHg.   After load reduction with hydralazine and isosorbide.  Check chest radiograph, patient with orthopnea, dyspnea and chest pain last night.  Limited pharmacological options due to reduced GFR.   Elevated troponin due to demand ischemia and heart failure decompensation.

## 2022-06-20 NOTE — Progress Notes (Signed)
   06/20/22 1038  Spiritual Encounters  Type of Visit Initial  Care provided to: Family  Referral source Other (comment) (Call from ED Security Officer)  OnCall Visit Yes  Spiritual Framework  Presenting Themes Impactful experiences and emotions  Patient Stress Factors None identified  Family Stress Factors None identified  Interventions  Spiritual Care Interventions Made Established relationship of care and support;Compassionate presence;Other (comment) (Escorted patient's spouse from ED to [facility] via elevator)   Responded from a call from ED security. Provided compassionate presence. Reflective listening and escorted patient's spouse from ED to [facility] via elevator.

## 2022-06-21 DIAGNOSIS — N189 Chronic kidney disease, unspecified: Secondary | ICD-10-CM | POA: Diagnosis present

## 2022-06-21 LAB — BASIC METABOLIC PANEL
Anion gap: 14 (ref 5–15)
BUN: 52 mg/dL — ABNORMAL HIGH (ref 8–23)
CO2: 19 mmol/L — ABNORMAL LOW (ref 22–32)
Calcium: 8.5 mg/dL — ABNORMAL LOW (ref 8.9–10.3)
Chloride: 110 mmol/L (ref 98–111)
Creatinine, Ser: 2.03 mg/dL — ABNORMAL HIGH (ref 0.61–1.24)
GFR, Estimated: 29 mL/min — ABNORMAL LOW (ref >60)
Glucose, Bld: 123 mg/dL — ABNORMAL HIGH (ref 70–99)
Potassium: 3.4 mmol/L — ABNORMAL LOW (ref 3.5–5.1)
Sodium: 143 mmol/L (ref 135–145)

## 2022-06-21 LAB — CBC
HCT: 24.7 % — ABNORMAL LOW (ref 39.0–52.0)
Hemoglobin: 8.3 g/dL — ABNORMAL LOW (ref 13.0–17.0)
MCH: 29.9 pg (ref 26.0–34.0)
MCHC: 33.6 g/dL (ref 30.0–36.0)
MCV: 88.8 fL (ref 80.0–100.0)
Platelets: 226 10*3/uL (ref 150–400)
RBC: 2.78 MIL/uL — ABNORMAL LOW (ref 4.22–5.81)
RDW: 16.9 % — ABNORMAL HIGH (ref 11.5–15.5)
WBC: 7.3 10*3/uL (ref 4.0–10.5)
nRBC: 0 % (ref 0.0–0.2)

## 2022-06-21 LAB — MAGNESIUM: Magnesium: 2 mg/dL (ref 1.7–2.4)

## 2022-06-21 MED ORDER — POTASSIUM CHLORIDE CRYS ER 20 MEQ PO TBCR
40.0000 meq | EXTENDED_RELEASE_TABLET | Freq: Once | ORAL | Status: AC
  Administered 2022-06-21: 40 meq via ORAL
  Filled 2022-06-21: qty 2

## 2022-06-21 MED ORDER — HYDRALAZINE HCL 25 MG PO TABS
25.0000 mg | ORAL_TABLET | Freq: Three times a day (TID) | ORAL | Status: DC
  Administered 2022-06-21 – 2022-06-24 (×9): 25 mg via ORAL
  Filled 2022-06-21 (×9): qty 1

## 2022-06-21 NOTE — Progress Notes (Signed)
Rounding Note    Patient Name: [name] Date of Encounter: 06/21/2022  [facility] Cardiologist: Previously followed by Dr. Tamala Julian  Subjective   No acute overnight events. He denies any recurrent chest pain. He does report some shortness of breath when he is getting in an out of bed which he states is new. He also has a cough this morning. He thinks his swelling has improved some. He also states he has had the swelling in his right leg for years (since 1980 after his CABG). Hematuria has stopped.  He did have concerns about his Timolol eye drops being stopped. He states he needs this for his glaucoma. He states his heart rates is usually in the 40s to 50s. He denies any lightheadedness, dizziness, or syncope with this.  Inpatient Medications    Scheduled Meds:  amLODipine  10 mg Oral Daily   atorvastatin  40 mg Oral QHS   ezetimibe  5 mg Oral QHS   ferrous sulfate  325 mg Oral Q breakfast   finasteride  5 mg Oral Q1200   furosemide  60 mg Intravenous BID   hydrALAZINE  25 mg Oral BID   isosorbide mononitrate  30 mg Oral Daily   latanoprost  1 drop Both Eyes QHS   pantoprazole  20 mg Oral Daily   Continuous Infusions:  PRN Meds: nitroGLYCERIN, traMADol   Vital Signs    Vitals:   06/20/22 1631 06/20/22 1942 06/21/22 0445 06/21/22 0450  BP: (!) 147/48 (!) 155/57  (!) 156/49  Pulse: (!) 54 (!) 58    Resp: '19 18  18  '$ Temp: 97.8 F (36.6 C) 98 F (36.7 C)  98.1 F (36.7 C)  TempSrc: Oral Oral  Oral  SpO2: 95% 94%  93%  Weight:   73.6 kg   Height:        Intake/Output Summary (Last 24 hours) at 06/21/2022 0747 Last data filed at 06/21/2022 0454 Gross per 24 hour  Intake 350 ml  Output 2740 ml  Net -2390 ml      06/21/2022    4:45 AM 06/20/2022   12:22 AM 06/19/2022    4:34 PM  Last 3 Weights  Weight (lbs) 162 lb 4.1 oz 169 lb 15.6 oz 169 lb 12.1 oz  Weight (kg) 73.6 kg 77.1 kg 77 kg      Telemetry    Atrial fibrillation with rates mostly in  the 50s but as low as the high 40s at times. Increases to the 80s periodically (suspect this is with exertion). - Personally Reviewed  ECG    No new ECG tracing today. - Personally Reviewed  Physical Exam   GEN: No acute distress.   Neck: No JVD. Cardiac: Irregularly irregular rhythm with slightly bradycardic. Very soft murmur. No rubs or gallops. Respiratory: No increased work of breathing. Clear to auscultation bilaterally. No wheezes, rhon GI: Soft, nontender, non-distended  MS: Trace to 1+ pitting edema of bilateral lower extremities (right chronically greater than left). No deformity. Skin: Warm and dry. Neuro:  No focal deficits. Psych: Normal affect. Responds appropriately.  Labs    High Sensitivity Troponin:   Recent Labs  Lab 06/19/22 0819 06/19/22 1019 06/20/22 0543  TROPONINIHS 162* 98* 877*     Chemistry Recent Labs  Lab 06/19/22 0819 06/20/22 0050 06/21/22 0032  NA 142 143 143  K 4.0 3.8 3.4*  CL 112* 112* 110  CO2 21* 20* 19*  GLUCOSE 130* 115* 123*  BUN 53* 50*  52*  CREATININE 1.82* 1.71* 2.03*  CALCIUM 8.7* 8.5* 8.5*  MG  --   --  2.0  GFRNONAA 33* 36* 29*  ANIONGAP '9 11 14    '$ Lipids No results for input(s): "CHOL", "TRIG", "HDL", "LABVLDL", "LDLCALC", "CHOLHDL" in the last 168 hours.  Hematology Recent Labs  Lab 06/19/22 0819 06/20/22 0050 06/21/22 0032  WBC 7.0 8.1 7.3  RBC 2.67* 2.88* 2.78*  HGB 8.1* 8.6* 8.3*  HCT 24.6* 26.0* 24.7*  MCV 92.1 90.3 88.8  MCH 30.3 29.9 29.9  MCHC 32.9 33.1 33.6  RDW 17.2* 16.6* 16.9*  PLT 258 223 226   Thyroid No results for input(s): "TSH", "FREET4" in the last 168 hours.  BNP Recent Labs  Lab 06/19/22 0830  BNP 786.5*    DDimer No results for input(s): "DDIMER" in the last 168 hours.   Radiology    ECHOCARDIOGRAM COMPLETE  Result Date: 06/20/2022    ECHOCARDIOGRAM REPORT   Patient Name:   [name] Date of Exam: 06/20/2022 Medical Rec #:  [id number]       Height:       71.0 in  Accession #:    [id number]      Weight:       170.0 lb Date of Birth:  10/04/1922       BSA:          1.968 m Patient Age:    89 years        BP:           141/53 mmHg Patient Gender: M               HR:           53 bpm. Exam Location:  Inpatient Procedure: 2D Echo, Cardiac Doppler and Color Doppler Indications:    CHF-Acute Diastolic [id number]  History:        Patient has prior history of Echocardiogram examinations, most                 recent 12/05/2014. CHF, TIA, Arrythmias:RBBB; Risk                 Factors:Hypertension, Dyslipidemia and Non-Smoker.  Sonographer:    Wilkie Aye RVT RCS Referring Phys: [id number] [place]  1. Left ventricular ejection fraction, by estimation, is 60 to 65%. The left ventricle has normal function. The left ventricle has no regional wall motion abnormalities. There is moderate left ventricular hypertrophy. Left ventricular diastolic parameters are indeterminate.  2. Ventricular septum is flattened is systole and diastole suggesting RV pressure and volume overload. . Right ventricular systolic function is moderately reduced. The right ventricular size is severely enlarged. There is severely elevated pulmonary artery systolic pressure.  3. Left atrial size was moderately dilated.  4. Right atrial size was severely dilated.  5. The mitral valve is abnormal. Mild mitral valve regurgitation. No evidence of mitral stenosis.  6. There is hepatic systolic flow reversal consistent with severe TR.. The tricuspid valve is abnormal. Tricuspid valve regurgitation is severe.  7. The aortic valve is tricuspid. There is mild calcification of the aortic valve. There is mild thickening of the aortic valve. Aortic valve regurgitation is not visualized. No aortic stenosis is present.  8. The inferior vena cava is dilated in size with <50% respiratory variability, suggesting right atrial pressure of 15 mmHg. FINDINGS  Left Ventricle: Left ventricular ejection fraction, by estimation, is 60 to  65%. The left ventricle has normal function. The left  ventricle has no regional wall motion abnormalities. The left ventricular internal cavity size was normal in size. There is  moderate left ventricular hypertrophy. Left ventricular diastolic parameters are indeterminate. Right Ventricle: Ventricular septum is flattened is systole and diastole suggesting RV pressure and volume overload. The right ventricular size is severely enlarged. Right vetricular wall thickness was not well visualized. Right ventricular systolic function is moderately reduced. There is severely elevated pulmonary artery systolic pressure. The tricuspid regurgitant velocity is 3.91 m/s, and with an assumed right atrial pressure of 15 mmHg, the estimated right ventricular systolic pressure is 123XX123  mmHg. Left Atrium: Left atrial size was moderately dilated. Right Atrium: Right atrial size was severely dilated. Pericardium: There is no evidence of pericardial effusion. Mitral Valve: The mitral valve is abnormal. There is mild thickening of the mitral valve leaflet(s). There is mild calcification of the mitral valve leaflet(s). Mild mitral annular calcification. Mild mitral valve regurgitation. No evidence of mitral valve stenosis. Tricuspid Valve: There is hepatic systolic flow reversal consistent with severe TR. The tricuspid valve is abnormal. Tricuspid valve regurgitation is severe. No evidence of tricuspid stenosis. Aortic Valve: The aortic valve is tricuspid. There is mild calcification of the aortic valve. There is mild thickening of the aortic valve. There is mild aortic valve annular calcification. Aortic valve regurgitation is not visualized. No aortic stenosis  is present. Aortic valve mean gradient measures 4.0 mmHg. Aortic valve peak gradient measures 8.0 mmHg. Aortic valve area, by VTI measures 2.01 cm. Pulmonic Valve: The pulmonic valve was not well visualized. Pulmonic valve regurgitation is not visualized. No evidence of  pulmonic stenosis. Aorta: The aortic root is normal in size and structure. Venous: The inferior vena cava is dilated in size with less than 50% respiratory variability, suggesting right atrial pressure of 15 mmHg. IAS/Shunts: No atrial level shunt detected by color flow Doppler.  LEFT VENTRICLE PLAX 2D LVIDd:         4.90 cm LVIDs:         3.10 cm LV PW:         1.30 cm LV IVS:        1.30 cm LVOT diam:     1.90 cm LV SV:         73 LV SV Index:   37 LVOT Area:     2.84 cm  RIGHT VENTRICLE             IVC RV Basal diam:  5.70 cm     IVC diam: 2.70 cm RV S prime:     12.80 cm/s TAPSE (M-mode): 1.7 cm LEFT ATRIUM             Index        RIGHT ATRIUM           Index LA diam:        5.40 cm 2.74 cm/m   RA Area:     29.20 cm LA Vol (A2C):   85.4 ml 43.40 ml/m  RA Volume:   92.20 ml  46.86 ml/m LA Vol (A4C):   86.2 ml 43.81 ml/m LA Biplane Vol: 91.2 ml 46.35 ml/m  AORTIC VALVE                    PULMONIC VALVE AV Area (Vmax):    2.21 cm     PV Vmax:       1.05 m/s AV Area (Vmean):   2.23 cm     PV Peak grad:  4.4 mmHg AV Area (VTI):     2.01 cm AV Vmax:           141.00 cm/s AV Vmean:          95.800 cm/s AV VTI:            0.366 m AV Peak Grad:      8.0 mmHg AV Mean Grad:      4.0 mmHg LVOT Vmax:         110.00 cm/s LVOT Vmean:        75.400 cm/s LVOT VTI:          0.259 m LVOT/AV VTI ratio: 0.71  AORTA Ao Root diam: 2.90 cm Ao Arch diam: 2.5 cm MITRAL VALVE                TRICUSPID VALVE MV Area (PHT): 4.30 cm     TR Peak grad:   61.2 mmHg MV Decel Time: 176 msec     TR Vmax:        391.00 cm/s MV E velocity: 109.22 cm/s MV A velocity: 50.27 cm/s   SHUNTS MV E/A ratio:  2.17         Systemic VTI:  0.26 m                             Systemic Diam: 1.90 cm Carlyle Dolly MD Electronically signed by Carlyle Dolly MD Signature Date/Time: 06/20/2022/3:48:03 PM    Final    US RENAL  Result Date: 06/19/2022 CLINICAL DATA:  Hematuria EXAM: RENAL / URINARY TRACT ULTRASOUND COMPLETE COMPARISON:  CT abdomen pelvis  01/18/2019 FINDINGS: Right Kidney: Renal measurements: 9.5 x 5.3 x 5.3 cm = volume: 140.6 mL. Renal cortical thinning. Increased cortical echogenicity. No hydronephrosis. Multiple cysts within the right kidney measuring up to 2.7 cm. Left Kidney: Renal measurements: 10.9 x 4.6 x 5.3 cm = volume: 137.8 mL. Renal cortical thinning. Increased cortical echogenicity. No hydronephrosis. Multiple cysts measuring up to 2.6 cm in the midpole. Bladder: Suggestion of echogenic layering debris within the urinary bladder lumen. Other: Prostate is markedly enlarged and impresses upon the bladder base. IMPRESSION: 1. No hydronephrosis. 2. Bilateral renal cortical thinning and increased cortical echogenicity suggestive of chronic medical renal disease. 3. Suggestion of echogenic layering debris within the urinary bladder lumen. Correlate with urinalysis. 4. Markedly enlarged prostate. Electronically Signed   By: Lovey Newcomer M.D.   On: 06/19/2022 13:48   DG Chest 2 View  Result Date: 06/19/2022 CLINICAL DATA:  Chest pain EXAM: CHEST - 2 VIEW COMPARISON:  12/04/2014 FINDINGS: Telemetry leads overlie the chest. Cardiomegaly, CABG changes and cardiac recorder again noted. On the view, LOWER lobe airspace disease and consolidation/atelectasis noted. A probable small effusion is present. There is no evidence of pneumothorax or acute bony abnormality. IMPRESSION: LOWER lobe airspace disease/pneumonia with consolidation/atelectasis with probable small pleural effusion. Electronically Signed   By: Margarette Canada M.D.   On: 06/19/2022 09:25    Cardiac Studies   Echocardiogram 06/20/2022: Impression:  1. Left ventricular ejection fraction, by estimation, is 60 to 65%. The  left ventricle has normal function. The left ventricle has no regional  wall motion abnormalities. There is moderate left ventricular hypertrophy.  Left ventricular diastolic  parameters are indeterminate.   2. Ventricular septum is flattened is systole and  diastole suggesting RV  pressure and volume overload. . Right ventricular systolic function is  moderately reduced. The right ventricular size  is severely enlarged. There  is severely elevated pulmonary  artery systolic pressure.   3. Left atrial size was moderately dilated.   4. Right atrial size was severely dilated.   5. The mitral valve is abnormal. Mild mitral valve regurgitation. No  evidence of mitral stenosis.   6. There is hepatic systolic flow reversal consistent with severe TR..  The tricuspid valve is abnormal. Tricuspid valve regurgitation is severe.   7. The aortic valve is tricuspid. There is mild calcification of the  aortic valve. There is mild thickening of the aortic valve. Aortic valve  regurgitation is not visualized. No aortic stenosis is present.   8. The inferior vena cava is dilated in size with <50% respiratory  variability, suggesting right atrial pressure of 15 mmHg.    Patient Profile     89 y.o. male with a history of CAD s/p CABG x4 (LIMA to LAD, SVG to Diag, SVG OM, and SVG to distal RCA) in 2000, paroxysmal atrial fibrillation on Eliquis, RBBB, CVA, hypertension, hyperlipidemia, GERD, and BPH who was admitted on 06/19/2022 with chest pain and hematuria. Also found to be in acute CHF and found to have an elevated troponin.  Assessment & Plan    Chest Pain CAD s/p CABG Patient presented with bandlike upper abdominal/ lower chest discomfort. EKG showed no acute ischemic changes. High-sensitivity troponin 98 >> 162 >> 877. Echo showed LVEF of 60-65% with no regional wall motion abnormalities.  - No recurrent chest pain. - Felt to be secondary to demand ischemia in setting of anemia/ hematuria, CHF, and known CAD. No ischemic evaluation planned at this time.  Acute Diastolic CHF RV Failure Patient presented with lower extremity edema but denied any worsening shortness of breath or orthopnea,. BNP elevated at 786.5. Chest x-ray showed lower lob airspace  disease/pneumonia with consolidation/ atelectasis and probably small pleural effusion. Echo showed LVEF of 60-65% with moderate LVH, severely enlarged RV with moderately reduced RV function and flattened ventricular septum in systole and diastole suggesting RV pressure and volume overload. He was started on IV Lasix. Documented 2.7 L yesterday and net negative 2.3 L this admission. Weight down 7lbs from yesterday. Creatinine up from 1.7 to 2.0 today.  - He continues to have some lower extremity edema but otherwise look euvolemic. Given rise in creatinine today, agree with holding morning dose of IV Lasix. Will review with MD. - Continue to monitor daily weights, strict I/Os, and renal function. - RV failure may be from severe TR (previously moderate). He is not hypoxic, tachycardic, or tachypneic and has been on Eliquis for atrial fibrillation so think PE is unlikely.  Paroxysmal Atrial Fibrillation Still in atrial fibrillation with slow ventricular rate. Rates mostly in the 50s (but as low as the high 40s at times). - Not on any AV nodal agents.  - He is on Timolol eye drops for his glaucoma. These were stopped yesterday given possible affect on conduction (although this occurs rarely).  Patient states he really needs these high drops. He states his heart rate is typically in the 40s to 50s at home and denies any lightheadedness, dizziness, or syncope. Will discuss restarting drops with MD. - On chronic anticoagulation with Eliquis at home but this is on hold in setting of hematuria and acute blood loss anemia. Restart when OK with Urology.  Severe Tricuspid Regurgitation Noted on Echo this admission. Prior Echo in 2016 showed moderate TR. - Continue to manage conservatively given advanced age.  Hypertension BP mostly elevated.  -  Continue Amlodipine '10mg'$  daily.  - Continue Imdur '30mg'$  daily.  - Will increase Hydralazine to '25mg'$  three times daily.  Hyperlipidemia - Continue home Lipitor '40mg'$   daily and Zetia '5mg'$  daily.  Acute on CKD Stage III Baseline creatinine around 1.5-1.6. Creatinine 1.82 on admission. Trended down to 1.71 yesterday but now up to 2.03 today. - Will hold IV Lasix for now and discuss with MD. - Continue to monitor closely.  Hypokalemia Potassium slightly low at 3.4 today.  - Will give one dose of K-Dur 40 mEq.   Hematuria Iron Deficiency Anemia due to Chronic Blood Loss Presented with gross hematuria for 3 weeks. Initially diagnosed with a UTI and treated with 2 different antibiotics. Hemoglobin was 8.1 on admission, down from 10.3 in 02/2022. S/p 1 unit of PRBCs on 3/9 and Degarelix on 3/10. - Hemoglobin 8.3 today. - Urology following and no plans for acute Urologic intervention at this time.    For questions or updates, please contact [place] Please consult [email or web address] for contact info under        Signed, Darreld Mclean, PA-C  06/21/2022, 7:47 AM

## 2022-06-21 NOTE — Progress Notes (Addendum)
Progress Note   Patient: [name] [id number] DOB: 1922-12-13 DOA: 06/19/2022     2 DOS: the patient was seen and examined on 06/21/2022   Brief hospital course: Mr. Erker was admitted to the hospital with the working diagnosis of hematuria and chest pain.   89 yo male with the past medical history of coronary artery disease, sp CABG 1990's, hypertension, paroxysmal atrial fibrillation, CKD and BPH sp TURP who presented with hematuria, dyspnea and chest pain. Reported 2 days of dyspnea on exertion, and lower extremity edema, with the night prior experiencing chest pressure like pain, that prompted him to come to the ED. He had 2 episodes of urinary tract infection, associated with hematuria, treated as outpatient with oral antibiotics. Hematuria has been persistent for the last 7 days. On his initial physical examination his blood pressure was 170/68, HR 52, RR 21 and 02 saturation 92%, lungs with rales bilaterally, with no wheezing but increased work of breathing, heart with S1 and S2 present and rhythmic, abdomen with no distention or tenderness, positive lower extremity edema.   Na 142, K 4,0 CL 112 bicarbonate 12, glucose 130 bun 53 cr 1,82  BNP 786 High sensitive troponin 162, 98, 877  Wbc 7,0 hgb 8,1 plt 258  Urine analysis with hematuria, not able to measure other parameters.   Chest radiograph with cardiomegaly, bilateral hilar vascular congestion, cephalization of the vasculature, fluid in the right fissure, left lower lobe atelectasis with small pleural effusion.   EKG 56 bpn, normal axis, normal qtc, right bundle branch block, atrial fibrillation rhythm, with ST depressions lead II, III, AvF, no significant T wave changes.    Patient received 2 units PRBC transfusion and was placed on IV furosemide for diuresis.  Urology was consulted for hematuria.   03/11 improved volume status and Hgb stable.   Assessment and Plan: * Acute on chronic diastolic CHF (congestive heart  failure) (HCC) Echocardiogram with preserved LV systolic function with EF 60 to 65%, moderate LVH, ventricular septum is flattened in systole and diastole, RV systolic function with moderate reduction, RV cavity with severe enlargement, RVSP 76.2 mmHg, RA with severe enlargement, severe tricuspid regurgitation.   Acute on chronic core pulmonale. Acute on chronic RV failure. Chronic pulmonary hypertension.   Urine output is documented at 2,740 ml.  Systolic blood pressure is 142 to 115 mmHg.   After load reduction with hydralazine and isosorbide.  Furosemide on hold per cardiology recommendations.  Limited pharmacological options due to reduced GFR.   Elevated troponin due to demand ischemia and heart failure decompensation.    Coronary atherosclerosis of artery bypass graft Demand ischemia, no acute coronary syndrome.   Plan to continue close blood pressure monitoring. Follow up hgb is 8,3   Keep hgb 8 or greater.  No anticoagulation due to active hematuria.  Continue with atorvastatin.   Acute kidney injury superimposed on chronic kidney disease (HCC) CKD stage 3b Hypokalemia.   Renal function with serum cr at 2,0 with K at 3,4 and serum bicarbonate at 19. Na 143, Mg 2,0 BUN 52.  Plant to continue K correction with kcl Holding on furosemide for today.  Follow up renal function and electrolytes in am.   Hematuria Acute blood loss anemia due to significant bleed likely from massive BPH changes exacerbated by recent cystitis.   Fast acting androgen deprivation with [name] per urology recommendations.  Continue to monitor for signs of urinary retention. Continue conservative care, continue to hold on anticoagulation. Follow up with  Urology as outpatient.   Paroxysmal atrial fibrillation (HCC) Rate controlled atrial fibrillation. Continue telemetry monitoring. No anticoagulation due to acute blood loss anemia, due to hematuria.   Iron deficiency anemia due to chronic  blood loss Check iron panel.  Follow up on Hgb and hct   Essential hypertension Continue blood pressure control with amlodipine, hydralazine and isosorbide.         Subjective: Patient is feeling better but not back to baseline, no further hematuria, no signs of urinary retention.   Physical Exam: Vitals:   06/21/22 0445 06/21/22 0450 06/21/22 0807 06/21/22 1107  BP:  (!) 156/49 (!) 165/51 (!) 115/42  Pulse:   68 (!) 59  Resp:  '18 18 17  '$ Temp:  98.1 F (36.7 C) 98.5 F (36.9 C) 98 F (36.7 C)  TempSrc:  Oral Oral Oral  SpO2:  93% 94% 94%  Weight: 73.6 kg     Height:       Neurology awake and alert ENT with mild pallor Cardiovascular with S1 and S2 present, irregular with positive murmur at the lower sternal border Moderate JVD Trace to + lower extremity edema Respiratory with mild rales at bases with no wheezing, no rhonchi Abdomen with no distention  Data Reviewed:    Family Communication: no family at the bedside   Disposition: Status is: Inpatient Remains inpatient appropriate because: heart failure, acute anemia,   Planned Discharge Destination: Home      Author: Tawni Millers, MD 06/21/2022 12:38 PM  For on call review [email or web address].

## 2022-06-21 NOTE — Evaluation (Signed)
Occupational Therapy Evaluation and Discharge Summary Patient Details Name: [name] MRN: [id number] DOB: 06/03/1922 Today's Date: 06/21/2022   History of Present Illness Patient is a 89 y/o male who presents on 3/9 for chest pain, SOB and hematuria. Found to have symptomatic anemia and acute CHF exacerbation. PMH includes CAD, CABG, CVA, BPH, HTN, A-fib.   Clinical Impression   Pt admitted for the above diagnosis. Pt currently ambulating around room and completing bADLS at Supervision level no AD. Pt educated on energy conservation strategies to address fatigue or SOB during long walks or challenging functional activities. No further skilled acute OT services recommended at this time. No follow-up OT services needed.       Recommendations for follow up therapy are one component of a multi-disciplinary discharge planning process, led by the attending physician.  Recommendations may be updated based on patient status, additional functional criteria and insurance authorization.   Follow Up Recommendations  No OT follow up     Assistance Recommended at Discharge PRN  Patient can return home with the following      Functional Status Assessment  Patient has not had a recent decline in their functional status  Equipment Recommendations  None recommended by OT    Recommendations for Other Services       Precautions / Restrictions Restrictions Weight Bearing Restrictions: No      Mobility Bed Mobility Overal bed mobility: Independent                  Transfers Overall transfer level: Needs assistance Equipment used: None Transfers: Sit to/from Stand Sit to Stand: Supervision           General transfer comment: sit>stand and bed t/f Supervision      Balance Overall balance assessment: Independent                                         ADL either performed or assessed with clinical judgement   ADL Overall ADL's : Independent;Needs  assistance/impaired                                             Vision Baseline Vision/History: 1 Wears glasses (Wears glasses for reading) Vision Assessment?: No apparent visual deficits     Perception     Praxis      Pertinent Vitals/Pain Pain Assessment Pain Assessment: No/denies pain     Hand Dominance Right   Extremity/Trunk Assessment Upper Extremity Assessment Upper Extremity Assessment: Overall WFL for tasks assessed (Gross strength, 4/5)   Lower Extremity Assessment Lower Extremity Assessment: Defer to PT evaluation   Cervical / Trunk Assessment Cervical / Trunk Assessment: Kyphotic   Communication Communication Communication: No difficulties   Cognition Arousal/Alertness: Awake/alert Behavior During Therapy: WFL for tasks assessed/performed Overall Cognitive Status: Within Functional Limits for tasks assessed                                       General Comments       Exercises     Shoulder Instructions      Home Living Family/patient expects to be discharged to:: Private residence ([facility]) Living Arrangements: Spouse/significant other (Per PT note,  with companion that is 53) Available Help at Discharge: Family;Available PRN/intermittently Type of Home: House Home Access: Level entry     Home Layout: One level     Bathroom Shower/Tub: Walk-in shower;Tub/shower unit (Pt has both, but uses tub/shower)   Bathroom Toilet: Handicapped height Bathroom Accessibility: Yes   Home Equipment: Shower seat - built in   Additional Comments: Pt resides at [place], reports he does not have to use stairs but does walk long hallways      Prior Functioning/Environment Prior Level of Function : Independent/Modified Independent             Mobility Comments: Independent, drives, cooks/cleans, goes to dining hall for at least 1 meal. ADLs Comments: independent, no falls reported        OT  Problem List:        OT Treatment/Interventions:      OT Goals(Current goals can be found in the care plan section) Acute Rehab OT Goals Patient Stated Goal: Go home OT Goal Formulation: With patient Time For Goal Achievement: 07/05/22 Potential to Achieve Goals: Good  OT Frequency:      Co-evaluation              AM-PAC OT "6 Clicks" Daily Activity     Outcome Measure Help from another person eating meals?: None Help from another person taking care of personal grooming?: None Help from another person toileting, which includes using toliet, bedpan, or urinal?: A Little Help from another person bathing (including washing, rinsing, drying)?: A Little Help from another person to put on and taking off regular upper body clothing?: None Help from another person to put on and taking off regular lower body clothing?: None 6 Click Score: 22   End of Session Equipment Utilized During Treatment: Gait belt Nurse Communication: Mobility status  Activity Tolerance: Patient tolerated treatment well Patient left: in bed;with call bell/phone within reach  OT Visit Diagnosis: Unsteadiness on feet (R26.81)                Time: [id number] OT Time Calculation (min): 17 min Charges:  OT General Charges $OT Visit: 1 Visit OT Evaluation $OT Eval Low Complexity: 1 Low  06/21/2022  AB, OTR/L  Acute Rehabilitation Services  Office: [phone]   Cori Razor 06/21/2022, 9:57 AM

## 2022-06-21 NOTE — Evaluation (Signed)
Physical Therapy Evaluation Patient Details Name: [name] MRN: [id number] DOB: Feb 24, 1923 Today's Date: 06/21/2022  History of Present Illness  Patient is a 89 y/o male who presents on 3/9 for chest pain, SOB and hematuria. Found to have symptomatic anemia and acute CHF exacerbation. PMH includes CAD, CABG, CVA, BPH, HTN, A-fib.  Clinical Impression  Patient reports being independent for ADLs/IADLs and ambulation PTA. Pt lives at [place] with his companion of many years. Today, pt tolerated transfers and ambulation with supervision-MOd I for safety. Tolerated head turns, walking backwards, changes in direction and sudden stops without difficulty. Pt with slow but steady gait speed and reports feeling at baseline with regards to mobility. Encouraged walking hallways daily with mobility tech. Does not require skilled therapy services. All education completed. DIscharge from therapy.       Recommendations for follow up therapy are one component of a multi-disciplinary discharge planning process, led by the attending physician.  Recommendations may be updated based on patient status, additional functional criteria and insurance authorization.  Follow Up Recommendations No PT follow up      Assistance Recommended at Discharge PRN  Patient can return home with the following  Assistance with cooking/housework    Equipment Recommendations None recommended by PT  Recommendations for Other Services       Functional Status Assessment Patient has not had a recent decline in their functional status     Precautions / Restrictions Precautions Precautions: None Restrictions Weight Bearing Restrictions: No      Mobility  Bed Mobility Overal bed mobility: Needs Assistance Bed Mobility: Supine to Sit, Sit to Supine     Supine to sit: Modified independent (Device/Increase time), HOB elevated Sit to supine: Modified independent (Device/Increase time), HOB elevated   General  bed mobility comments: NO assist needed.    Transfers Overall transfer level: Needs assistance Equipment used: None Transfers: Sit to/from Stand Sit to Stand: Supervision           General transfer comment: SUpervision for safety. Stood from [employer].    Ambulation/Gait Ambulation/Gait assistance: Supervision Gait Distance (Feet): 300 Feet Assistive device: None Gait Pattern/deviations: Step-through pattern, Decreased stride length Gait velocity: decreased Gait velocity interpretation: <1.31 ft/sec, indicative of household ambulator   General Gait Details: Slow, guarded gait with decreased arm swing bilaterally. Tolerated higher level balance activities without LOB or difficulty. See balance section for details.  Stairs            Wheelchair Mobility    Modified Rankin (Stroke Patients Only)       Balance Overall balance assessment: Needs assistance Sitting-balance support: Feet supported, No upper extremity supported Sitting balance-Leahy Scale: Good     Standing balance support: During functional activity Standing balance-Leahy Scale: Good               High level balance activites: Backward walking, Direction changes, Turns, Sudden stops, Head turns High Level Balance Comments: Tolerated above with only mild deviations in gait but no overt LOB.             Pertinent Vitals/Pain Pain Assessment Pain Assessment: No/denies pain    Home Living Family/patient expects to be discharged to:: Private residence ([place]) Living Arrangements: Spouse/significant other (pt's companiion) Available Help at Discharge: Family;Available PRN/intermittently Type of Home: House Home Access: Level entry       Home Layout: One level Home Equipment: Shower seat - built Medical sales representative (2 wheels) Additional Comments: Pt resides at [facility]  ILF, reports he does not have to use stairs but does walk long hallways    Prior Function Prior Level of  Function : Independent/Modified Independent             Mobility Comments: Independent, drives, cooks/cleans, goes to dining hall for at least 1 meal. ADLs Comments: independent, no falls reported     Hand Dominance   Dominant Hand: Right    Extremity/Trunk Assessment   Upper Extremity Assessment Upper Extremity Assessment: Defer to OT evaluation    Lower Extremity Assessment Lower Extremity Assessment: Overall WFL for tasks assessed    Cervical / Trunk Assessment Cervical / Trunk Assessment: Kyphotic  Communication   Communication: No difficulties  Cognition Arousal/Alertness: Awake/alert Behavior During Therapy: WFL for tasks assessed/performed Overall Cognitive Status: Within Functional Limits for tasks assessed                                          General Comments General comments (skin integrity, edema, etc.): VSS on RA    Exercises     Assessment/Plan    PT Assessment Patient does not need any further PT services  PT Problem List         PT Treatment Interventions      PT Goals (Current goals can be found in the Care Plan section)  Acute Rehab PT Goals Patient Stated Goal: to go home PT Goal Formulation: All assessment and education complete, DC therapy    Frequency       Co-evaluation               AM-PAC PT "6 Clicks" Mobility  Outcome Measure Help needed turning from your back to your side while in a flat bed without using bedrails?: None Help needed moving from lying on your back to sitting on the side of a flat bed without using bedrails?: None Help needed moving to and from a bed to a chair (including a wheelchair)?: A Little Help needed standing up from a chair using your arms (e.g., wheelchair or bedside chair)?: A Little Help needed to walk in hospital room?: A Little Help needed climbing 3-5 steps with a railing? : A Little 6 Click Score: 20    End of Session Equipment Utilized During Treatment: Gait  belt Activity Tolerance: Patient tolerated treatment well Patient left: in bed;with call bell/phone within reach Nurse Communication: Mobility status PT Visit Diagnosis: Muscle weakness (generalized) (M62.81);Difficulty in walking, not elsewhere classified (R26.2)    Time: 0825 [id number] (had to come back for mobility))-0837 PT Time Calculation (min) (ACUTE ONLY): 12 min   Charges:   PT Evaluation $PT Eval Low Complexity: 1 Low PT Treatments $Gait Training: 8-22 mins        Marisa Severin, PT, DPT Acute Rehabilitation Services Secure chat preferred Office [place] 06/21/2022, 10:58 AM

## 2022-06-21 NOTE — Assessment & Plan Note (Addendum)
CKD stage 3b Hypokalemia.   Clinically with signs of hypervolemia today/ Renal function with serum cr at 2.19 with K at 3,7 and serum bicarbonate at 20.  Na 144 and BUN 55.   Follow up chest radiograph. Follow up renal function in am.

## 2022-06-21 NOTE — Progress Notes (Signed)
Heart Failure Navigator Progress Note  Assessed for Heart & Vascular TOC clinic readiness.  Patient EF 60-65%, NO TOC per Dr. Cathlean Sauer, will have [facility] to follow.   Navigator will sign off at this time.   Earnestine Leys, BSN, Clinical cytogeneticist Only

## 2022-06-21 NOTE — Progress Notes (Cosign Needed Addendum)
Subjective/Chief Complaint:  S: 1- Massive Prostatic Hypertrophy - 180gm prostate with large medain by prior CT s/p TURP decades ago and on finasteride at baseline. No retention.  2 - Gross Hematuria / Cystitis - recent cystitis treated medcially, CX not avail for reviw. UA here 3/9 after treatmetn w/o sig bacteruria. He does get occasional hematurai form prostate. CT 2020 with massive prostate, no upper tract masses.  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  Today "[name]" is stable. He reports his hematuria has resolved and now is voiding yellow urine. Hgb 8.3 today. Now s/p Degarelix yesterday.    Objective: Vital signs in last 24 hours: Temp:  [97.4 F (36.3 C)-98.1 F (36.7 C)] 98.1 F (36.7 C) (03/11 0450) Pulse Rate:  [54-59] 58 (03/10 1942) Resp:  [18-20] 18 (03/11 0450) BP: (122-156)/(43-57) 156/49 (03/11 0450) SpO2:  [93 %-95 %] 93 % (03/11 0450) Weight:  [73.6 kg] 73.6 kg (03/11 0445) Last BM Date : 06/20/22  Intake/Output from previous day: 03/10 0701 - 03/11 0700 In: 350 [P.O.:350] Out: 2740 [Urine:2740] Intake/Output this shift: Total I/O In: 350 [P.O.:350] Out: 1090 [Urine:1090]  NAD, very pleasant and spry for age Non-labored breathing on RA SNTND Voiding spontaneously   Lab Results:  Recent Labs    06/20/22 0050 06/21/22 0032  WBC 8.1 7.3  HGB 8.6* 8.3*  HCT 26.0* 24.7*  PLT 223 226    BMET Recent Labs    06/20/22 0050 06/21/22 0032  NA 143 143  K 3.8 3.4*  CL 112* 110  CO2 20* 19*  GLUCOSE 115* 123*  BUN 50* 52*  CREATININE 1.71* 2.03*  CALCIUM 8.5* 8.5*    PT/INR Recent Labs    06/19/22 0819  LABPROT 15.3*  INR 1.2    ABG No results for input(s): "PHART", "HCO3" in the last 72 hours.  Invalid input(s): "PCO2", "PO2"  Studies/Results: ECHOCARDIOGRAM COMPLETE  Result Date: 06/20/2022    ECHOCARDIOGRAM REPORT   Patient Name:   AIKAM ELBAZ Date of Exam: 06/20/2022 Medical Rec #:  [id number]       Height:       71.0 in Accession #:    [id number]      Weight:       170.0 lb Date of Birth:  [birth year]       BSA:          1.968 m Patient Age:    89 years        BP:           141/53 mmHg Patient Gender: M               HR:           53 bpm. Exam Location:  Inpatient Procedure: 2D Echo, Cardiac Doppler and Color Doppler Indications:    CHF-Acute Diastolic [id number]  History:        Patient has prior history of Echocardiogram examinations, most                 recent 12/05/2014. CHF, TIA, Arrythmias:RBBB; Risk                 Factors:Hypertension, Dyslipidemia and Non-Smoker.  Sonographer:    Wilkie Aye RVT RCS Referring Phys: [id number] [place]  1. Left ventricular ejection fraction, by estimation, is 60 to 65%. The left ventricle has normal function. The left ventricle has no regional wall  motion abnormalities. There is moderate left ventricular hypertrophy. Left ventricular diastolic parameters are indeterminate.  2. Ventricular septum is flattened is systole and diastole suggesting RV pressure and volume overload. . Right ventricular systolic function is moderately reduced. The right ventricular size is severely enlarged. There is severely elevated pulmonary artery systolic pressure.  3. Left atrial size was moderately dilated.  4. Right atrial size was severely dilated.  5. The mitral valve is abnormal. Mild mitral valve regurgitation. No evidence of mitral stenosis.  6. There is hepatic systolic flow reversal consistent with severe TR.. The tricuspid valve is abnormal. Tricuspid valve regurgitation is severe.  7. The aortic valve is tricuspid. There is mild calcification of the aortic valve. There is mild thickening of the aortic valve. Aortic valve regurgitation is not visualized. No aortic stenosis is present.  8. The inferior vena cava is dilated in size with <50% respiratory variability, suggesting right atrial pressure of 15 mmHg.  FINDINGS  Left Ventricle: Left ventricular ejection fraction, by estimation, is 60 to 65%. The left ventricle has normal function. The left ventricle has no regional wall motion abnormalities. The left ventricular internal cavity size was normal in size. There is  moderate left ventricular hypertrophy. Left ventricular diastolic parameters are indeterminate. Right Ventricle: Ventricular septum is flattened is systole and diastole suggesting RV pressure and volume overload. The right ventricular size is severely enlarged. Right vetricular wall thickness was not well visualized. Right ventricular systolic function is moderately reduced. There is severely elevated pulmonary artery systolic pressure. The tricuspid regurgitant velocity is 3.91 m/s, and with an assumed right atrial pressure of 15 mmHg, the estimated right ventricular systolic pressure is 123XX123  mmHg. Left Atrium: Left atrial size was moderately dilated. Right Atrium: Right atrial size was severely dilated. Pericardium: There is no evidence of pericardial effusion. Mitral Valve: The mitral valve is abnormal. There is mild thickening of the mitral valve leaflet(s). There is mild calcification of the mitral valve leaflet(s). Mild mitral annular calcification. Mild mitral valve regurgitation. No evidence of mitral valve stenosis. Tricuspid Valve: There is hepatic systolic flow reversal consistent with severe TR. The tricuspid valve is abnormal. Tricuspid valve regurgitation is severe. No evidence of tricuspid stenosis. Aortic Valve: The aortic valve is tricuspid. There is mild calcification of the aortic valve. There is mild thickening of the aortic valve. There is mild aortic valve annular calcification. Aortic valve regurgitation is not visualized. No aortic stenosis  is present. Aortic valve mean gradient measures 4.0 mmHg. Aortic valve peak gradient measures 8.0 mmHg. Aortic valve area, by VTI measures 2.01 cm. Pulmonic Valve: The pulmonic valve was not  well visualized. Pulmonic valve regurgitation is not visualized. No evidence of pulmonic stenosis. Aorta: The aortic root is normal in size and structure. Venous: The inferior vena cava is dilated in size with less than 50% respiratory variability, suggesting right atrial pressure of 15 mmHg. IAS/Shunts: No atrial level shunt detected by color flow Doppler.  LEFT VENTRICLE PLAX 2D LVIDd:         4.90 cm LVIDs:         3.10 cm LV PW:         1.30 cm LV IVS:        1.30 cm LVOT diam:     1.90 cm LV SV:         73 LV SV Index:   37 LVOT Area:     2.84 cm  RIGHT VENTRICLE  IVC RV Basal diam:  5.70 cm     IVC diam: 2.70 cm RV S prime:     12.80 cm/s TAPSE (M-mode): 1.7 cm LEFT ATRIUM             Index        RIGHT ATRIUM           Index LA diam:        5.40 cm 2.74 cm/m   RA Area:     29.20 cm LA Vol (A2C):   85.4 ml 43.40 ml/m  RA Volume:   92.20 ml  46.86 ml/m LA Vol (A4C):   86.2 ml 43.81 ml/m LA Biplane Vol: 91.2 ml 46.35 ml/m  AORTIC VALVE                    PULMONIC VALVE AV Area (Vmax):    2.21 cm     PV Vmax:       1.05 m/s AV Area (Vmean):   2.23 cm     PV Peak grad:  4.4 mmHg AV Area (VTI):     2.01 cm AV Vmax:           141.00 cm/s AV Vmean:          95.800 cm/s AV VTI:            0.366 m AV Peak Grad:      8.0 mmHg AV Mean Grad:      4.0 mmHg LVOT Vmax:         110.00 cm/s LVOT Vmean:        75.400 cm/s LVOT VTI:          0.259 m LVOT/AV VTI ratio: 0.71  AORTA Ao Root diam: 2.90 cm Ao Arch diam: 2.5 cm MITRAL VALVE                TRICUSPID VALVE MV Area (PHT): 4.30 cm     TR Peak grad:   61.2 mmHg MV Decel Time: 176 msec     TR Vmax:        391.00 cm/s MV E velocity: 109.22 cm/s MV A velocity: 50.27 cm/s   SHUNTS MV E/A ratio:  2.17         Systemic VTI:  0.26 m                             Systemic Diam: 1.90 cm Carlyle Dolly MD Electronically signed by Carlyle Dolly MD Signature Date/Time: 06/20/2022/3:48:03 PM    Final    US RENAL  Result Date: 06/19/2022 CLINICAL DATA:   Hematuria EXAM: RENAL / URINARY TRACT ULTRASOUND COMPLETE COMPARISON:  CT abdomen pelvis 01/18/2019 FINDINGS: Right Kidney: Renal measurements: 9.5 x 5.3 x 5.3 cm = volume: 140.6 mL. Renal cortical thinning. Increased cortical echogenicity. No hydronephrosis. Multiple cysts within the right kidney measuring up to 2.7 cm. Left Kidney: Renal measurements: 10.9 x 4.6 x 5.3 cm = volume: 137.8 mL. Renal cortical thinning. Increased cortical echogenicity. No hydronephrosis. Multiple cysts measuring up to 2.6 cm in the midpole. Bladder: Suggestion of echogenic layering debris within the urinary bladder lumen. Other: Prostate is markedly enlarged and impresses upon the bladder base. IMPRESSION: 1. No hydronephrosis. 2. Bilateral renal cortical thinning and increased cortical echogenicity suggestive of chronic medical renal disease. 3. Suggestion of echogenic layering debris within the urinary bladder lumen. Correlate with urinalysis. 4. Markedly enlarged prostate. Electronically Signed   By: Dian Situ  Rosana Hoes M.D.   On: 06/19/2022 13:48   DG Chest 2 View  Result Date: 06/19/2022 CLINICAL DATA:  Chest pain EXAM: CHEST - 2 VIEW COMPARISON:  12/04/2014 FINDINGS: Telemetry leads overlie the chest. Cardiomegaly, CABG changes and cardiac recorder again noted. On the view, LOWER lobe airspace disease and consolidation/atelectasis noted. A probable small effusion is present. There is no evidence of pneumothorax or acute bony abnormality. IMPRESSION: LOWER lobe airspace disease/pneumonia with consolidation/atelectasis with probable small pleural effusion. Electronically Signed   By: Margarette Canada M.D.   On: 06/19/2022 09:25    Anti-infectives: Anti-infectives (From admission, onward)    None       Assessment/Plan: 89 year old male with history of CAD/CABG, HTN, HLD, TIA and atrial fibrillation on eliquis, elevated PSA, BPH s/p TURP (1992) on finasteride daily, gross hematuria in 2018 who is admitted with chest pain in setting  of gross hematuria and recent UTI.   Per patient, hematuria has resolved. S/p Degarelix on 06/20/22.  - No indication for acute urologic intervention at this time. Will continue conservative management at this time. - Eliquis being held at this time. - Patient has follow up appointment with Dr. Alinda Money on 06/22/22. If patient is still hospitalized, we will reschedule his appointment.    Jaston Havens Jaelene Garciagarcia 06/21/2022

## 2022-06-21 NOTE — Consult Note (Signed)
   [facility] CM Inpatient Consult   06/21/2022  CLELL TRAHAN February 14, 1923 [id number]  Orientation with Natividad Brood, [place] [facility] Liaison for review.   Location:  [facility] Liaison met with pt via bedside [facility]).   [facility] [place] Patient: Nurse, adult)    Primary Care Provider:  [name], NP  at [facility]  Patient screened with no hospitalization readmissions noted low risk score for unplanned readmission risk to assess for potential [facility]) Care Management service needs for post hospital transition for care coordination.  Pt reports he visits the [place] in [place].  Pt further discussed he is awaiting further test to occur. Pt without complaints or issues at this time. Currently awaiting his companion to arrive for added support.  Pt has agreed to a follow up call and services for a care coordinator with [facility]. Pt was given an appointment reminder card and 24 hours Nurse Advise Line magnet.      Plan:  HIPAA verified and [facility] RN will continue to follow ongoing disposition to assess for post hospital community care coordination/management needs.  Referral request for community care coordination: Will address accordingly based upon pt's discharge disposition for the initial referral.   [place] does not replace or interfere with any arrangements made by the Inpatient Transition of Care team.   For questions contact:    Raina Mina, RN, [place] Hours M-F 8:00 am to 5 pm [phone] mobile [phone] [Office toll free line][facility] Office Hours are M-F 8:30 - 5 pm 24 hour nurse advise line [phone] Conceirge  [email or web address]

## 2022-06-21 NOTE — TOC Progression Note (Signed)
Transition of Care [facility]) - Progression Note    Patient Details  Name: [name] MRN: [id number] Date of Birth: 10-04-1922  Transition of Care [facility] CM/SW Contact  Zenon Mayo, RN Phone Number: 06/21/2022, 4:19 PM  Clinical Narrative:    from [place], still drives, CHF, chest pain, hematuria and uti  conts on iv lasix. TOC following.   Expected Discharge Plan: Home/Self Care    Expected Discharge Plan and Services                                               Social Determinants of Health (SDOH) Interventions SDOH Screenings   Tobacco Use: Low Risk  (06/19/2022)    Readmission Risk Interventions     No data to display

## 2022-06-22 ENCOUNTER — Inpatient Hospital Stay (HOSPITAL_COMMUNITY): Payer: Medicare Other

## 2022-06-22 LAB — CBC
HCT: 26.4 % — ABNORMAL LOW (ref 39.0–52.0)
Hemoglobin: 8.6 g/dL — ABNORMAL LOW (ref 13.0–17.0)
MCH: 29.5 pg (ref 26.0–34.0)
MCHC: 32.6 g/dL (ref 30.0–36.0)
MCV: 90.4 fL (ref 80.0–100.0)
Platelets: 228 10*3/uL (ref 150–400)
RBC: 2.92 MIL/uL — ABNORMAL LOW (ref 4.22–5.81)
RDW: 17.2 % — ABNORMAL HIGH (ref 11.5–15.5)
WBC: 6.9 10*3/uL (ref 4.0–10.5)
nRBC: 0 % (ref 0.0–0.2)

## 2022-06-22 LAB — BASIC METABOLIC PANEL
Anion gap: 12 (ref 5–15)
BUN: 55 mg/dL — ABNORMAL HIGH (ref 8–23)
CO2: 20 mmol/L — ABNORMAL LOW (ref 22–32)
Calcium: 8.3 mg/dL — ABNORMAL LOW (ref 8.9–10.3)
Chloride: 112 mmol/L — ABNORMAL HIGH (ref 98–111)
Creatinine, Ser: 2.19 mg/dL — ABNORMAL HIGH (ref 0.61–1.24)
GFR, Estimated: 27 mL/min — ABNORMAL LOW (ref >60)
Glucose, Bld: 115 mg/dL — ABNORMAL HIGH (ref 70–99)
Potassium: 3.7 mmol/L (ref 3.5–5.1)
Sodium: 144 mmol/L (ref 135–145)

## 2022-06-22 LAB — IRON AND TIBC
Iron: 18 ug/dL — ABNORMAL LOW (ref 45–182)
Saturation Ratios: 7 % — ABNORMAL LOW (ref 17.9–39.5)
TIBC: 262 ug/dL (ref 250–450)
UIBC: 244 ug/dL

## 2022-06-22 LAB — BRAIN NATRIURETIC PEPTIDE: B Natriuretic Peptide: 672.7 pg/mL — ABNORMAL HIGH (ref 0.0–100.0)

## 2022-06-22 LAB — TRANSFERRIN: Transferrin: 188 mg/dL (ref 180–329)

## 2022-06-22 LAB — FERRITIN: Ferritin: 38 ng/mL (ref 24–336)

## 2022-06-22 MED ORDER — ISOSORBIDE MONONITRATE ER 60 MG PO TB24
60.0000 mg | ORAL_TABLET | Freq: Every day | ORAL | Status: DC
  Administered 2022-06-23 – 2022-06-24 (×2): 60 mg via ORAL
  Filled 2022-06-22 (×2): qty 1

## 2022-06-22 MED ORDER — SODIUM CHLORIDE 0.9 % IV SOLN
510.0000 mg | Freq: Once | INTRAVENOUS | Status: AC
  Administered 2022-06-22: 510 mg via INTRAVENOUS
  Filled 2022-06-22: qty 17

## 2022-06-22 MED ORDER — ALUM & MAG HYDROXIDE-SIMETH 200-200-20 MG/5ML PO SUSP
30.0000 mL | ORAL | Status: DC | PRN
  Administered 2022-06-22: 30 mL via ORAL
  Filled 2022-06-22: qty 30

## 2022-06-22 MED ORDER — SODIUM CHLORIDE 0.9 % IV SOLN
1.0000 g | INTRAVENOUS | Status: DC
  Administered 2022-06-22: 1 g via INTRAVENOUS
  Filled 2022-06-22: qty 10

## 2022-06-22 MED ORDER — ISOSORBIDE MONONITRATE ER 30 MG PO TB24
30.0000 mg | ORAL_TABLET | Freq: Once | ORAL | Status: AC
  Administered 2022-06-22: 30 mg via ORAL
  Filled 2022-06-22: qty 1

## 2022-06-22 NOTE — Progress Notes (Signed)
Subjective/Chief Complaint:  S: 1- Massive Prostatic Hypertrophy - 180gm prostate with large medain by prior CT s/p TURP decades ago and on finasteride at baseline. No retention.  2 - Gross Hematuria / Cystitis - recent cystitis treated medcially, CX not avail for reviw. UA here 3/9 after treatmetn w/o sig bacteruria. He does get occasional hematurai form prostate. CT 2020 with massive prostate, no upper tract masses.  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  Today "[name]" is stable. Urine remains clear yellow. Voiding spontaneously without issue. Hgb 8.6 and stable this morning. Cr slowly uptrending, 2.19 this morning.   Objective: Vital signs in last 24 hours: Temp:  [97.8 F (36.6 C)-98.5 F (36.9 C)] 98.5 F (36.9 C) (03/12 0450) Pulse Rate:  [57-72] 60 (03/12 0450) Resp:  [17-19] 18 (03/12 0450) BP: (115-165)/(42-54) 132/52 (03/12 0450) SpO2:  [91 %-97 %] 92 % (03/12 0450) Weight:  [73 kg] 73 kg (03/12 0450) Last BM Date : 06/20/22  Intake/Output from previous day: 03/11 0701 - 03/12 0700 In: 840 [P.O.:840] Out: 650 [Urine:650] Intake/Output this shift: No intake/output data recorded.  NAD, very pleasant and spry for age Non-labored breathing on RA SNTND Voiding spontaneously, urine in urinal clear yellow   Lab Results:  Recent Labs    06/21/22 0032 06/22/22 0041  WBC 7.3 6.9  HGB 8.3* 8.6*  HCT 24.7* 26.4*  PLT 226 228    BMET Recent Labs    06/21/22 0032 06/22/22 0041  NA 143 144  K 3.4* 3.7  CL 110 112*  CO2 19* 20*  GLUCOSE 123* 115*  BUN 52* 55*  CREATININE 2.03* 2.19*  CALCIUM 8.5* 8.3*    PT/INR Recent Labs    06/19/22 0819  LABPROT 15.3*  INR 1.2    ABG No results for input(s): "PHART", "HCO3" in the last 72 hours.  Invalid input(s): "PCO2", "PO2"  Studies/Results: ECHOCARDIOGRAM COMPLETE  Result Date: 06/20/2022    ECHOCARDIOGRAM REPORT    Patient Name:   [name] Date of Exam: 06/20/2022 Medical Rec #:  [id number]       Height:       71.0 in Accession #:    [name]      Weight:       170.0 lb Date of Birth:  06/01/1922       BSA:          1.968 m Patient Age:    89 years        BP:           141/53 mmHg Patient Gender: M               HR:           53 bpm. Exam Location:  Inpatient Procedure: 2D Echo, Cardiac Doppler and Color Doppler Indications:    CHF-Acute Diastolic [id number]  History:        Patient has prior history of Echocardiogram examinations, most                 recent 12/05/2014. CHF, TIA, Arrythmias:RBBB; Risk                 Factors:Hypertension, Dyslipidemia and Non-Smoker.  Sonographer:    [name] RVT RCS Referring Phys: [name] [place]  1. Left ventricular ejection fraction, by estimation, is 60 to 65%. The left ventricle has normal function. The left ventricle has no regional  wall motion abnormalities. There is moderate left ventricular hypertrophy. Left ventricular diastolic parameters are indeterminate.  2. Ventricular septum is flattened is systole and diastole suggesting RV pressure and volume overload. . Right ventricular systolic function is moderately reduced. The right ventricular size is severely enlarged. There is severely elevated pulmonary artery systolic pressure.  3. Left atrial size was moderately dilated.  4. Right atrial size was severely dilated.  5. The mitral valve is abnormal. Mild mitral valve regurgitation. No evidence of mitral stenosis.  6. There is hepatic systolic flow reversal consistent with severe TR.. The tricuspid valve is abnormal. Tricuspid valve regurgitation is severe.  7. The aortic valve is tricuspid. There is mild calcification of the aortic valve. There is mild thickening of the aortic valve. Aortic valve regurgitation is not visualized. No aortic stenosis is present.  8. The inferior vena cava is dilated in size with <50% respiratory variability, suggesting right  atrial pressure of 15 mmHg. FINDINGS  Left Ventricle: Left ventricular ejection fraction, by estimation, is 60 to 65%. The left ventricle has normal function. The left ventricle has no regional wall motion abnormalities. The left ventricular internal cavity size was normal in size. There is  moderate left ventricular hypertrophy. Left ventricular diastolic parameters are indeterminate. Right Ventricle: Ventricular septum is flattened is systole and diastole suggesting RV pressure and volume overload. The right ventricular size is severely enlarged. Right vetricular wall thickness was not well visualized. Right ventricular systolic function is moderately reduced. There is severely elevated pulmonary artery systolic pressure. The tricuspid regurgitant velocity is 3.91 m/s, and with an assumed right atrial pressure of 15 mmHg, the estimated right ventricular systolic pressure is 123XX123  mmHg. Left Atrium: Left atrial size was moderately dilated. Right Atrium: Right atrial size was severely dilated. Pericardium: There is no evidence of pericardial effusion. Mitral Valve: The mitral valve is abnormal. There is mild thickening of the mitral valve leaflet(s). There is mild calcification of the mitral valve leaflet(s). Mild mitral annular calcification. Mild mitral valve regurgitation. No evidence of mitral valve stenosis. Tricuspid Valve: There is hepatic systolic flow reversal consistent with severe TR. The tricuspid valve is abnormal. Tricuspid valve regurgitation is severe. No evidence of tricuspid stenosis. Aortic Valve: The aortic valve is tricuspid. There is mild calcification of the aortic valve. There is mild thickening of the aortic valve. There is mild aortic valve annular calcification. Aortic valve regurgitation is not visualized. No aortic stenosis  is present. Aortic valve mean gradient measures 4.0 mmHg. Aortic valve peak gradient measures 8.0 mmHg. Aortic valve area, by VTI measures 2.01 cm. Pulmonic Valve:  The pulmonic valve was not well visualized. Pulmonic valve regurgitation is not visualized. No evidence of pulmonic stenosis. Aorta: The aortic root is normal in size and structure. Venous: The inferior vena cava is dilated in size with less than 50% respiratory variability, suggesting right atrial pressure of 15 mmHg. IAS/Shunts: No atrial level shunt detected by color flow Doppler.  LEFT VENTRICLE PLAX 2D LVIDd:         4.90 cm LVIDs:         3.10 cm LV PW:         1.30 cm LV IVS:        1.30 cm LVOT diam:     1.90 cm LV SV:         73 LV SV Index:   37 LVOT Area:     2.84 cm  RIGHT VENTRICLE  IVC RV Basal diam:  5.70 cm     IVC diam: 2.70 cm RV S prime:     12.80 cm/s TAPSE (M-mode): 1.7 cm LEFT ATRIUM             Index        RIGHT ATRIUM           Index LA diam:        5.40 cm 2.74 cm/m   RA Area:     29.20 cm LA Vol (A2C):   85.4 ml 43.40 ml/m  RA Volume:   92.20 ml  46.86 ml/m LA Vol (A4C):   86.2 ml 43.81 ml/m LA Biplane Vol: 91.2 ml 46.35 ml/m  AORTIC VALVE                    PULMONIC VALVE AV Area (Vmax):    2.21 cm     PV Vmax:       1.05 m/s AV Area (Vmean):   2.23 cm     PV Peak grad:  4.4 mmHg AV Area (VTI):     2.01 cm AV Vmax:           141.00 cm/s AV Vmean:          95.800 cm/s AV VTI:            0.366 m AV Peak Grad:      8.0 mmHg AV Mean Grad:      4.0 mmHg LVOT Vmax:         110.00 cm/s LVOT Vmean:        75.400 cm/s LVOT VTI:          0.259 m LVOT/AV VTI ratio: 0.71  AORTA Ao Root diam: 2.90 cm Ao Arch diam: 2.5 cm MITRAL VALVE                TRICUSPID VALVE MV Area (PHT): 4.30 cm     TR Peak grad:   61.2 mmHg MV Decel Time: 176 msec     TR Vmax:        391.00 cm/s MV E velocity: 109.22 cm/s MV A velocity: 50.27 cm/s   SHUNTS MV E/A ratio:  2.17         Systemic VTI:  0.26 m                             Systemic Diam: 1.90 cm Carlyle Dolly MD Electronically signed by Carlyle Dolly MD Signature Date/Time: 06/20/2022/3:48:03 PM    Final      Anti-infectives: Anti-infectives (From admission, onward)    None       Assessment/Plan: 89 year old male with history of CAD/CABG, HTN, HLD, TIA and atrial fibrillation on eliquis, elevated PSA, BPH s/p TURP (1992) on finasteride daily, gross hematuria in 2018 who is admitted with chest pain in setting of gross hematuria and recent UTI.   Per patient, hematuria has resolved (06/21/22). S/p Degarelix on 06/20/22.  - No indication for acute urologic intervention at this time.  - Patient has follow up appointment with Dr. Alinda Money on 06/22/22. If patient is still hospitalized, we will reschedule his appointment.  Please page urology with any questions or concerns.    Henderson Frampton Deserai Cansler 06/22/2022

## 2022-06-22 NOTE — Progress Notes (Signed)
Progress Note   Patient: [name] [id number] DOB: [birth year] DOA: 06/19/2022     3 DOS: the patient was seen and examined on 06/22/2022   Brief hospital course: [name] was admitted to the hospital with the working diagnosis of hematuria and chest pain.   89 yo male with the past medical history of coronary artery disease, sp CABG 1990's, hypertension, paroxysmal atrial fibrillation, CKD and BPH sp TURP who presented with hematuria, dyspnea and chest pain. Reported 2 days of dyspnea on exertion, and lower extremity edema, with the night prior experiencing chest pressure like pain, that prompted him to come to the ED. He had 2 episodes of urinary tract infection, associated with hematuria, treated as outpatient with oral antibiotics. Hematuria has been persistent for the last 7 days. On his initial physical examination his blood pressure was 170/68, HR 52, RR 21 and 02 saturation 92%, lungs with rales bilaterally, with no wheezing but increased work of breathing, heart with S1 and S2 present and rhythmic, abdomen with no distention or tenderness, positive lower extremity edema.   Na 142, K 4,0 CL 112 bicarbonate 12, glucose 130 bun 53 cr 1,82  BNP 786 High sensitive troponin 162, 98, 877  Wbc 7,0 hgb 8,1 plt 258  Urine analysis with hematuria, not able to measure other parameters.   Chest radiograph with cardiomegaly, bilateral hilar vascular congestion, cephalization of the vasculature, fluid in the right fissure, left lower lobe atelectasis with small pleural effusion.   EKG 56 bpn, normal axis, normal qtc, right bundle branch block, atrial fibrillation rhythm, with ST depressions lead II, III, AvF, no significant T wave changes.    Patient received 2 units PRBC transfusion and was placed on IV furosemide for diuresis.  Urology was consulted for hematuria.   03/11 improved volume status and Hgb stable.  03/12 no further hematuria.   Assessment and Plan: * Acute on chronic  diastolic CHF (congestive heart failure) (HCC) Echocardiogram with preserved LV systolic function with EF 60 to 65%, moderate LVH, ventricular septum is flattened in systole and diastole, RV systolic function with moderate reduction, RV cavity with severe enlargement, RVSP 76.2 mmHg, RA with severe enlargement, severe tricuspid regurgitation.   Acute on chronic core pulmonale. Acute on chronic RV failure. Chronic pulmonary hypertension.   Urine output is documented at 650 ml.  Systolic blood pressure is 113 to 129 mmHg.   After load reduction with hydralazine and isosorbide.  Check chest radiograph, patient with orthopnea, dyspnea and chest pain last night.  Limited pharmacological options due to reduced GFR.   Elevated troponin due to demand ischemia and heart failure decompensation.    Coronary atherosclerosis of artery bypass graft Demand ischemia, no acute coronary syndrome.   Plan to continue close blood pressure monitoring. Follow up hgb is 8,6   Keep hgb 8 or greater, liberal transfusion strategy in the setting of demand ischemia.  No anticoagulation due to active hematuria.  Continue with atorvastatin.   Acute kidney injury superimposed on chronic kidney disease (HCC) CKD stage 3b Hypokalemia.   Clinically with signs of hypervolemia today/ Renal function with serum cr at 2.19 with K at 3,7 and serum bicarbonate at 20.  Na 144 and BUN 55.   Follow up chest radiograph. Follow up renal function in am.   Hematuria Acute blood loss anemia due to significant bleed likely from massive BPH changes exacerbated by recent cystitis.   Fast acting androgen deprivation with [name] per urology recommendations.  Continue to monitor  for signs of urinary retention. Continue conservative care, continue to hold on anticoagulation. Follow up with Urology as outpatient.  Pending recommendations when can resume apixaban.   Paroxysmal atrial fibrillation (HCC) Rate controlled atrial  fibrillation. Continue telemetry monitoring. No anticoagulation due to acute blood loss anemia, due to hematuria.  Pending recommendations when apixaban can be resumed.   Iron deficiency anemia due to chronic blood loss Iron panel with serum iron at 18, TIBC 262, transferrin saturation 7 and ferritin 38, Plan for IV iron today.    Essential hypertension Continue blood pressure control with amlodipine, hydralazine and isosorbide.         Subjective: Patient with dyspnea, chest pain and orthopnea last night, no further bleeding/ hematuria.   Physical Exam: Vitals:   06/22/22 0450 06/22/22 0725 06/22/22 1046 06/22/22 1139  BP: (!) 132/52 (!) 149/51 (!) 113/47 (!) 129/53  Pulse: 60 71 61   Resp: '18 18 18 18  '$ Temp: 98.5 F (36.9 C) 98.3 F (36.8 C) 98.1 F (36.7 C) 98 F (36.7 C)  TempSrc: Oral Oral Oral Oral  SpO2: 92% 94% 91% 91%  Weight: 73 kg     Height:       Neurology awake and alert ENT with mild pallor Cardiovascular with S1 and S2 present, irregularly irregular with positive murmur at the apex, no gallops, or rubs Positive JVD Positive lower extremity edema more right than left Respiratory with rales bilaterally with no wheezing Abdomen with no distention  Data Reviewed:    Family Communication: I spoke with patient's partner at the bedside, we talked in detail about patient's condition, plan of care and prognosis and all questions were addressed.   Disposition: Status is: Inpatient Remains inpatient appropriate because: monitor bleeding and diuresing   Planned Discharge Destination: Home     Author: Tawni Millers, MD 06/22/2022 2:45 PM  For on call review [email or web address].

## 2022-06-22 NOTE — Progress Notes (Signed)
PROGRESS NOTE    [name]  [id number] DOB: 05-08-1922 DOA: 06/19/2022 PCP: [name], NP   89/M w/CAD, sp CABG [phone], hypertension, paroxysmal atrial fibrillation, CKD and BPH sp TURP who presented with hematuria, dyspnea and chest pain. Reported 2 days of dyspnea on exertion, and lower extremity edema, with the night prior experiencing chest pressure. - He had 2 episodes of urinary tract infection, associated with hematuria, treated as outpatient with oral antibiotics. Hematuria has been persistent for the last 7 days -Labs w/ bun 53 cr 1,82, BNP 786, troponin 162, 98, 877  -Urine analysis with hematuria, not able to measure other parameters.  -CXR-cardiomegaly, bilateral hilar vascular congestion, cephalization of the vasculature, fluid in the right fissure -transfused 2 units PRBC transfusion, placed on IV furosemide for diuresis.  Urology was consulted for hematuria.  -3/11 improving volume status and Hgb stable.  -3/12 no further hematuria  Subjective: Feels ok, some orthopnea  Assessment and Plan:  Acute on chronic diastolic CHF  RV failure, Pulmonary hypertension.  Severe TR -Echo with EF 60 to 65%, moderate LVH,  RV systolic function with moderate reduction, RV cavity with severe enlargement, severe tricuspid regurgitation.  -diuresed with IV lasix, 1.3L negative, weight down 7lbs, then held lasix w/ bump in creat -restart IV lasix today, BMP in am -After load reduction with hydralazine and isosorbide. -BMP in am  CAD, CABG Demand ischemia, no acute coronary syndrome.  -Continue atorvastatin.   Acute kidney injury superimposed on chronic kidney disease (HCC) CKD stage 3b Hypokalemia.  -Creatinine stable, resuming IV Lasix today  Hematuria Acute blood loss anemia due to significant bleed likely from massive BPH changes exacerbated by recent cystitis.  -s/p Degarelix 3/10, per urology recommendations.  -eliquis on hold,  d/w urology this morning, okay to  resume, monitor for hematuria recurrence, CBC in a.m.  Paroxysmal atrial fibrillation (HCC) -See discussion above, resuming Eliquis today  Iron deficiency anemia due to chronic blood loss Iron panel with serum iron at 18, TIBC 262, transferrin saturation 7 and ferritin 38, sp IV iron   Essential hypertension Continue  amlodipine, hydralazine and isosorbide.    DVT prophylaxis: SCDs Code Status: Full Code Family Communication: Disposition Plan:   Consultants:    Procedures:   Antimicrobials:    Objective: Vitals:   06/22/22 0450 06/22/22 0725 06/22/22 1046 06/22/22 1139  BP: (!) 132/52 (!) 149/51 (!) 113/47 (!) 129/53  Pulse: 60 71 61   Resp: '18 18 18 18  '$ Temp: 98.5 F (36.9 C) 98.3 F (36.8 C) 98.1 F (36.7 C) 98 F (36.7 C)  TempSrc: Oral Oral Oral Oral  SpO2: 92% 94% 91% 91%  Weight: 73 kg     Height:        Intake/Output Summary (Last 24 hours) at 06/22/2022 1623 Last data filed at 06/22/2022 0851 Gross per 24 hour  Intake 620 ml  Output 500 ml  Net 120 ml   Filed Weights   06/20/22 0022 06/21/22 0445 06/22/22 0450  Weight: 77.1 kg 73.6 kg 73 kg    Examination:      Data Reviewed:   CBC: Recent Labs  Lab 06/19/22 0819 06/20/22 0050 06/21/22 0032 06/22/22 0041  WBC 7.0 8.1 7.3 6.9  HGB 8.1* 8.6* 8.3* 8.6*  HCT 24.6* 26.0* 24.7* 26.4*  MCV 92.1 90.3 88.8 90.4  PLT 258 223 226 [id number]   Basic Metabolic Panel: Recent Labs  Lab 06/19/22 0819 06/20/22 0050 06/21/22 0032 06/22/22 0041  NA 142 143 143 144  K 4.0 3.8 3.4* 3.7  CL 112* 112* 110 112*  CO2 21* 20* 19* 20*  GLUCOSE 130* 115* 123* 115*  BUN 53* 50* 52* 55*  CREATININE 1.82* 1.71* 2.03* 2.19*  CALCIUM 8.7* 8.5* 8.5* 8.3*  MG  --   --  2.0  --    GFR: Estimated Creatinine Clearance: 19.9 mL/min (A) (by C-G formula based on SCr of 2.19 mg/dL (H)). Liver Function Tests: No results for input(s): "AST", "ALT", "ALKPHOS", "BILITOT", "PROT", "ALBUMIN" in the last 168 hours. No  results for input(s): "LIPASE", "AMYLASE" in the last 168 hours. No results for input(s): "AMMONIA" in the last 168 hours. Coagulation Profile: Recent Labs  Lab 06/19/22 0819  INR 1.2   Cardiac Enzymes: No results for input(s): "CKTOTAL", "CKMB", "CKMBINDEX", "TROPONINI" in the last 168 hours. BNP (last 3 results) No results for input(s): "PROBNP" in the last 8760 hours. HbA1C: No results for input(s): "HGBA1C" in the last 72 hours. CBG: No results for input(s): "GLUCAP" in the last 168 hours. Lipid Profile: No results for input(s): "CHOL", "HDL", "LDLCALC", "TRIG", "CHOLHDL", "LDLDIRECT" in the last 72 hours. Thyroid Function Tests: No results for input(s): "TSH", "T4TOTAL", "FREET4", "T3FREE", "THYROIDAB" in the last 72 hours. Anemia Panel: Recent Labs    06/22/22 0041  FERRITIN 38  TIBC 262  IRON 18*   Urine analysis:    Component Value Date/Time   COLORURINE RED (A) 06/19/2022 1055   APPEARANCEUR TURBID (A) 06/19/2022 1055   LABSPEC  06/19/2022 1055    TEST NOT REPORTED DUE TO COLOR INTERFERENCE OF URINE PIGMENT   PHURINE  06/19/2022 1055    TEST NOT REPORTED DUE TO COLOR INTERFERENCE OF URINE PIGMENT   GLUCOSEU (A) 06/19/2022 1055    TEST NOT REPORTED DUE TO COLOR INTERFERENCE OF URINE PIGMENT   HGBUR (A) 06/19/2022 1055    TEST NOT REPORTED DUE TO COLOR INTERFERENCE OF URINE PIGMENT   BILIRUBINUR (A) 06/19/2022 1055    TEST NOT REPORTED DUE TO COLOR INTERFERENCE OF URINE PIGMENT   KETONESUR (A) 06/19/2022 1055    TEST NOT REPORTED DUE TO COLOR INTERFERENCE OF URINE PIGMENT   PROTEINUR (A) 06/19/2022 1055    TEST NOT REPORTED DUE TO COLOR INTERFERENCE OF URINE PIGMENT   NITRITE (A) 06/19/2022 1055    TEST NOT REPORTED DUE TO COLOR INTERFERENCE OF URINE PIGMENT   LEUKOCYTESUR (A) 06/19/2022 1055    TEST NOT REPORTED DUE TO COLOR INTERFERENCE OF URINE PIGMENT   Sepsis Labs: '@LABRCNTIP'$ (procalcitonin:4,lacticidven:4)  )No results found for this or any previous  visit (from the past 240 hour(s)).   Radiology Studies: DG Chest Port 1 View  Result Date: 06/22/2022 CLINICAL DATA:  Dyspnea EXAM: PORTABLE CHEST 1 VIEW COMPARISON:  Chest x-ray 06/19/2022 FINDINGS: Heart is enlarged. Loop recorder device is present. Sternotomy wires and mediastinal clips are present. There are patchy bibasilar airspace opacities. There is no pleural effusion or pneumothorax. No acute fractures are seen. IMPRESSION: 1. Patchy bibasilar airspace opacities worrisome for infection. 2. Cardiomegaly. Electronically Signed   By: Ronney Asters M.D.   On: 06/22/2022 15:40     Scheduled Meds:  amLODipine  10 mg Oral Daily   atorvastatin  40 mg Oral QHS   ezetimibe  5 mg Oral QHS   finasteride  5 mg Oral Q1200   hydrALAZINE  25 mg Oral TID   [START ON 06/23/2022] isosorbide mononitrate  60 mg Oral Daily   latanoprost  1 drop Both Eyes QHS   pantoprazole  20 mg Oral Daily   Continuous Infusions:   LOS: 3 days    Time spent: 76mn    PDomenic Polite MD [facility]   06/22/2022, 4:23 PM

## 2022-06-22 NOTE — Progress Notes (Addendum)
Rounding Note    Patient Name: [name] Date of Encounter: 06/22/2022  [place] Cardiologist: Sinclair Grooms, MD (Inactive)   Subjective   Patient had some chest discomfort overnight. He described this as a heartburn sensation. He was given a dose of Maalox with almost complete resolution. He was also given one dose of sublingual Nitroglycerin. After this, the pain came back but then resolved on its own. Currently chest pain free. He initially described this as a heartburn but then stated it started on both sides of his neck and then radiated down to his lower chest/ upper stomach and felt like all his sheets were laying/ pressing on his chest. He also reports shortness of breath with this and described some orthopnea which is new. He also has a wet sounding cough.  He denies any recurrent hematuria since 06/20/2022.   Inpatient Medications    Scheduled Meds:  amLODipine  10 mg Oral Daily   atorvastatin  40 mg Oral QHS   ezetimibe  5 mg Oral QHS   ferrous sulfate  325 mg Oral Q breakfast   finasteride  5 mg Oral Q1200   hydrALAZINE  25 mg Oral TID   isosorbide mononitrate  30 mg Oral Daily   latanoprost  1 drop Both Eyes QHS   pantoprazole  20 mg Oral Daily   Continuous Infusions:  PRN Meds: alum & mag hydroxide-simeth, nitroGLYCERIN, traMADol   Vital Signs    Vitals:   06/21/22 1532 06/21/22 1952 06/22/22 0017 06/22/22 0450  BP: (!) 143/54 (!) 126/50 (!) 135/42 (!) 132/52  Pulse: 72 (!) 57 69 60  Resp: '19 18 18 18  '$ Temp: 97.8 F (36.6 C) 98.4 F (36.9 C) 98.4 F (36.9 C) 98.5 F (36.9 C)  TempSrc: Oral Oral Oral Oral  SpO2: 97% 91% 93% 92%  Weight:    73 kg  Height:        Intake/Output Summary (Last 24 hours) at 06/22/2022 0644 Last data filed at 06/21/2022 2130 Gross per 24 hour  Intake 840 ml  Output 650 ml  Net 190 ml      06/22/2022    4:50 AM 06/21/2022    4:45 AM 06/20/2022   12:22 AM  Last 3 Weights  Weight (lbs) 160 lb 15 oz  162 lb 4.1 oz 169 lb 15.6 oz  Weight (kg) 73 kg 73.6 kg 77.1 kg      Telemetry    Atrial fibrillation with rates mostly in the 50s to 70s (as low as the 40s at times but not as often as before) - Personally Reviewed  ECG    Atrial fibrillation, rate 65 bpm, with RBBB and downsloping ST depression in inferior leads and leads V3-V6 as well as T wave inversions in lead V2. No significant changes compared to prior EKG on 06/19/2022. - Personally Reviewed  Physical Exam   GEN: Elderly Caucasian male resting comfortably in no acute distress. No acute distress.   Neck: No JVD. Cardiac: Irregularly irregular rhythm with normal rate. I-II/VI systolic murmurs. No rubs or gallops. Respiratory: Clear to auscultation bilaterally. No wheezes, rhonchi, or rales. GI: Soft, non-distended, and non-tender.  MS: 1+ pitting edema of bilateral lower extremity. No deformity. Skin: Warm and dry. Neuro:  No focal deficits. Psych: Normal affect. Responds appropriately.  Labs    High Sensitivity Troponin:   Recent Labs  Lab 06/19/22 0819 06/19/22 1019 06/20/22 0543  TROPONINIHS 162* 98* 877*     Chemistry  Recent Labs  Lab 06/20/22 0050 06/21/22 0032 06/22/22 0041  NA 143 143 144  K 3.8 3.4* 3.7  CL 112* 110 112*  CO2 20* 19* 20*  GLUCOSE 115* 123* 115*  BUN 50* 52* 55*  CREATININE 1.71* 2.03* 2.19*  CALCIUM 8.5* 8.5* 8.3*  MG  --  2.0  --   GFRNONAA 36* 29* 27*  ANIONGAP '11 14 12    '$ Lipids No results for input(s): "CHOL", "TRIG", "HDL", "LABVLDL", "LDLCALC", "CHOLHDL" in the last 168 hours.  Hematology Recent Labs  Lab 06/20/22 0050 06/21/22 0032 06/22/22 0041  WBC 8.1 7.3 6.9  RBC 2.88* 2.78* 2.92*  HGB 8.6* 8.3* 8.6*  HCT 26.0* 24.7* 26.4*  MCV 90.3 88.8 90.4  MCH 29.9 29.9 29.5  MCHC 33.1 33.6 32.6  RDW 16.6* 16.9* 17.2*  PLT 223 226 228   Thyroid No results for input(s): "TSH", "FREET4" in the last 168 hours.  BNP Recent Labs  Lab 06/19/22 0830  BNP 786.5*    DDimer  No results for input(s): "DDIMER" in the last 168 hours.   Radiology    ECHOCARDIOGRAM COMPLETE  Result Date: 06/20/2022    ECHOCARDIOGRAM REPORT   Patient Name:   CHARLENE MURRIE Date of Exam: 06/20/2022 Medical Rec #:  [id number]       Height:       71.0 in Accession #:    [id number]      Weight:       170.0 lb Date of Birth:  Oct 28, 1922       BSA:          1.968 m Patient Age:    89 years        BP:           141/53 mmHg Patient Gender: M               HR:           53 bpm. Exam Location:  Inpatient Procedure: 2D Echo, Cardiac Doppler and Color Doppler Indications:    CHF-Acute Diastolic [id number]  History:        Patient has prior history of Echocardiogram examinations, most                 recent 12/05/2014. CHF, TIA, Arrythmias:RBBB; Risk                 Factors:Hypertension, Dyslipidemia and Non-Smoker.  Sonographer:    Wilkie Aye RVT RCS Referring Phys: [id number] [place]  1. Left ventricular ejection fraction, by estimation, is 60 to 65%. The left ventricle has normal function. The left ventricle has no regional wall motion abnormalities. There is moderate left ventricular hypertrophy. Left ventricular diastolic parameters are indeterminate.  2. Ventricular septum is flattened is systole and diastole suggesting RV pressure and volume overload. . Right ventricular systolic function is moderately reduced. The right ventricular size is severely enlarged. There is severely elevated pulmonary artery systolic pressure.  3. Left atrial size was moderately dilated.  4. Right atrial size was severely dilated.  5. The mitral valve is abnormal. Mild mitral valve regurgitation. No evidence of mitral stenosis.  6. There is hepatic systolic flow reversal consistent with severe TR.. The tricuspid valve is abnormal. Tricuspid valve regurgitation is severe.  7. The aortic valve is tricuspid. There is mild calcification of the aortic valve. There is mild thickening of the aortic valve. Aortic valve  regurgitation is not visualized. No aortic stenosis is present.  8. The inferior  vena cava is dilated in size with <50% respiratory variability, suggesting right atrial pressure of 15 mmHg. FINDINGS  Left Ventricle: Left ventricular ejection fraction, by estimation, is 60 to 65%. The left ventricle has normal function. The left ventricle has no regional wall motion abnormalities. The left ventricular internal cavity size was normal in size. There is  moderate left ventricular hypertrophy. Left ventricular diastolic parameters are indeterminate. Right Ventricle: Ventricular septum is flattened is systole and diastole suggesting RV pressure and volume overload. The right ventricular size is severely enlarged. Right vetricular wall thickness was not well visualized. Right ventricular systolic function is moderately reduced. There is severely elevated pulmonary artery systolic pressure. The tricuspid regurgitant velocity is 3.91 m/s, and with an assumed right atrial pressure of 15 mmHg, the estimated right ventricular systolic pressure is 123XX123  mmHg. Left Atrium: Left atrial size was moderately dilated. Right Atrium: Right atrial size was severely dilated. Pericardium: There is no evidence of pericardial effusion. Mitral Valve: The mitral valve is abnormal. There is mild thickening of the mitral valve leaflet(s). There is mild calcification of the mitral valve leaflet(s). Mild mitral annular calcification. Mild mitral valve regurgitation. No evidence of mitral valve stenosis. Tricuspid Valve: There is hepatic systolic flow reversal consistent with severe TR. The tricuspid valve is abnormal. Tricuspid valve regurgitation is severe. No evidence of tricuspid stenosis. Aortic Valve: The aortic valve is tricuspid. There is mild calcification of the aortic valve. There is mild thickening of the aortic valve. There is mild aortic valve annular calcification. Aortic valve regurgitation is not visualized. No aortic stenosis  is  present. Aortic valve mean gradient measures 4.0 mmHg. Aortic valve peak gradient measures 8.0 mmHg. Aortic valve area, by VTI measures 2.01 cm. Pulmonic Valve: The pulmonic valve was not well visualized. Pulmonic valve regurgitation is not visualized. No evidence of pulmonic stenosis. Aorta: The aortic root is normal in size and structure. Venous: The inferior vena cava is dilated in size with less than 50% respiratory variability, suggesting right atrial pressure of 15 mmHg. IAS/Shunts: No atrial level shunt detected by color flow Doppler.  LEFT VENTRICLE PLAX 2D LVIDd:         4.90 cm LVIDs:         3.10 cm LV PW:         1.30 cm LV IVS:        1.30 cm LVOT diam:     1.90 cm LV SV:         73 LV SV Index:   37 LVOT Area:     2.84 cm  RIGHT VENTRICLE             IVC RV Basal diam:  5.70 cm     IVC diam: 2.70 cm RV S prime:     12.80 cm/s TAPSE (M-mode): 1.7 cm LEFT ATRIUM             Index        RIGHT ATRIUM           Index LA diam:        5.40 cm 2.74 cm/m   RA Area:     29.20 cm LA Vol (A2C):   85.4 ml 43.40 ml/m  RA Volume:   92.20 ml  46.86 ml/m LA Vol (A4C):   86.2 ml 43.81 ml/m LA Biplane Vol: 91.2 ml 46.35 ml/m  AORTIC VALVE  PULMONIC VALVE AV Area (Vmax):    2.21 cm     PV Vmax:       1.05 m/s AV Area (Vmean):   2.23 cm     PV Peak grad:  4.4 mmHg AV Area (VTI):     2.01 cm AV Vmax:           141.00 cm/s AV Vmean:          95.800 cm/s AV VTI:            0.366 m AV Peak Grad:      8.0 mmHg AV Mean Grad:      4.0 mmHg LVOT Vmax:         110.00 cm/s LVOT Vmean:        75.400 cm/s LVOT VTI:          0.259 m LVOT/AV VTI ratio: 0.71  AORTA Ao Root diam: 2.90 cm Ao Arch diam: 2.5 cm MITRAL VALVE                TRICUSPID VALVE MV Area (PHT): 4.30 cm     TR Peak grad:   61.2 mmHg MV Decel Time: 176 msec     TR Vmax:        391.00 cm/s MV E velocity: 109.22 cm/s MV A velocity: 50.27 cm/s   SHUNTS MV E/A ratio:  2.17         Systemic VTI:  0.26 m                             Systemic  Diam: 1.90 cm Carlyle Dolly MD Electronically signed by Carlyle Dolly MD Signature Date/Time: 06/20/2022/3:48:03 PM    Final     Cardiac Studies   Echocardiogram 06/20/2022: Impression:  1. Left ventricular ejection fraction, by estimation, is 60 to 65%. The  left ventricle has normal function. The left ventricle has no regional  wall motion abnormalities. There is moderate left ventricular hypertrophy.  Left ventricular diastolic  parameters are indeterminate.   2. Ventricular septum is flattened is systole and diastole suggesting RV  pressure and volume overload. . Right ventricular systolic function is  moderately reduced. The right ventricular size is severely enlarged. There  is severely elevated pulmonary  artery systolic pressure.   3. Left atrial size was moderately dilated.   4. Right atrial size was severely dilated.   5. The mitral valve is abnormal. Mild mitral valve regurgitation. No  evidence of mitral stenosis.   6. There is hepatic systolic flow reversal consistent with severe TR..  The tricuspid valve is abnormal. Tricuspid valve regurgitation is severe.   7. The aortic valve is tricuspid. There is mild calcification of the  aortic valve. There is mild thickening of the aortic valve. Aortic valve  regurgitation is not visualized. No aortic stenosis is present.   8. The inferior vena cava is dilated in size with <50% respiratory  variability, suggesting right atrial pressure of 15 mmHg.   Patient Profile     89 y.o. male with a history of CAD s/p CABG x4 (LIMA to LAD, SVG to Diag, SVG OM, and SVG to distal RCA) in 2000, paroxysmal atrial fibrillation on Eliquis, RBBB, CVA, hypertension, hyperlipidemia, GERD, and BPH who was admitted on 06/19/2022 with chest pain and hematuria. Also found to be in acute CHF and found to have an elevated troponin.   Assessment & Plan    Chest Pain CAD s/p  CABG Patient presented with bandlike upper abdominal/ lower chest discomfort.  EKG showed no acute ischemic changes. High-sensitivity troponin 162 >> 98 >> 877. Echo showed LVEF of 60-65% with no regional wall motion abnormalities.  - He had some recurrent chest pain last night that initially improved with Maalox and sublingual Nitro but then returned and subsequently resolved spontaneously. Currently chest pain free.  - Continue Amlodipine '10mg'$  daily.  - Will increase Imdur to '60mg'$  daily.  - No beta-blocker due to baseline bradycardia. - Symptoms initially felt to be secondary to demand ischemia in setting of anemia/ hematuria, CHF, and known CAD. Chest pain last night sounded concerning for angina to me. Will increase Imdur as above. Not a cardiac catheterization candidate right now in setting of renal function and anemia/hematuria. Could consider a Myoview to look for any high-risk features but think it is reasonable to try to adjust antianginals first. Will discuss with MD.   Acute Diastolic CHF RV Failure Patient presented with lower extremity edema but denied any worsening shortness of breath or orthopnea,. BNP elevated at 786.5. Chest x-ray showed lower lob airspace disease/pneumonia with consolidation/ atelectasis and probably small pleural effusion. Echo showed LVEF of 60-65% with moderate LVH, severely enlarged RV with moderately reduced RV function and flattened ventricular septum in systole and diastole suggesting RV pressure and volume overload. He was initially started on IV Lasix but this was stopped on 3/11 due to worsening renal function . Net negative 2.1 L this admission. Weight down 2lbs from yesterday and 9lbs from admission. Creatinine continues to slowly increase. - He continues to have some lower extremity edema but otherwise looked euvolemic on exam. However, he does described some shortness of breath and orthopnea overnight. - Will repeat BNP today. - Will continue to hold Lasix for now given creatinine continues to rise. - Continue to monitor daily  weights, strict I/Os, and renal function. - RV failure may be from severe TR (previously moderate). He is not hypoxic, tachycardic, or tachypneic and has been on Eliquis for atrial fibrillation so think PE is unlikely.   Paroxysmal Atrial Fibrillation Still in atrial fibrillation with slow ventricular rate. Rates mostly in the 50s to 70s (not in the 40s as much). - Not on any AV nodal agents.  - He was on Timolol eye drops for his glaucoma but these have been stopped and rates have improved some off this. - On chronic anticoagulation with Eliquis at home but this is on hold in setting of hematuria and acute blood loss anemia. Restart when OK with Urology.   Severe Tricuspid Regurgitation Noted on Echo this admission. Prior Echo in 2016 showed moderate TR. - Continue to manage conservatively given advanced age.   Hypertension BP mostly well controlled over the last 24 hours. - Continue Amlodipine '10mg'$  daily.  - Continue Hydralazine '25mg'$  three times daily. - Will increase Imdur to '60mg'$  daily for additional antianginal affect.   Hyperlipidemia - Continue home Lipitor '40mg'$  daily and Zetia '5mg'$  daily.   Acute on CKD Stage III Baseline creatinine around 1.5-1.6. Creatinine 1.82 on admission. Trended down to 1.71 yesterday but increased to 2.03 yesterday and now 2.19 today. Renal ultrasound this admission suggestive of chronic medical renal disease but no hydronephrosis.  - Continue to hold diuretics. - Continue to avoid Nephrotoxic agents.  - Continue to monitor closely.   Hypokalemia Potassium slightly low at 3.4 yesterday. Repleted. - Improved to 3.7 today.   Hematuria Iron Deficiency Anemia due to Chronic Blood Loss Presented with  gross hematuria for 3 weeks. Initially diagnosed with a UTI and treated with 2 different antibiotics. Hemoglobin was 8.1 on admission, down from 10.3 in 02/2022. S/p 1 unit of PRBCs on 3/9 and Degarelix on 3/10. - He denies any recurrent hematuria since  3/10. - Hemoglobin stable at 8.6 today. - Urology following and no plans for acute Urologic intervention at this time.     For questions or updates, please contact [place] Please consult [email or web address] for contact info under        Signed, Darreld Mclean, PA-C  06/22/2022, 6:44 AM

## 2022-06-22 NOTE — Progress Notes (Signed)
Follow up radiograph with right lower lobe interstitial infiltrate, suggesting pneumonia.  Will start patient on antibiotic therapy with ceftriaxone.

## 2022-06-23 LAB — BASIC METABOLIC PANEL
Anion gap: 13 (ref 5–15)
BUN: 56 mg/dL — ABNORMAL HIGH (ref 8–23)
CO2: 19 mmol/L — ABNORMAL LOW (ref 22–32)
Calcium: 8.4 mg/dL — ABNORMAL LOW (ref 8.9–10.3)
Chloride: 112 mmol/L — ABNORMAL HIGH (ref 98–111)
Creatinine, Ser: 2.13 mg/dL — ABNORMAL HIGH (ref 0.61–1.24)
GFR, Estimated: 28 mL/min — ABNORMAL LOW (ref >60)
Glucose, Bld: 110 mg/dL — ABNORMAL HIGH (ref 70–99)
Potassium: 3.7 mmol/L (ref 3.5–5.1)
Sodium: 144 mmol/L (ref 135–145)

## 2022-06-23 LAB — CBC
HCT: 24.5 % — ABNORMAL LOW (ref 39.0–52.0)
Hemoglobin: 8.2 g/dL — ABNORMAL LOW (ref 13.0–17.0)
MCH: 29.9 pg (ref 26.0–34.0)
MCHC: 33.5 g/dL (ref 30.0–36.0)
MCV: 89.4 fL (ref 80.0–100.0)
Platelets: 236 10*3/uL (ref 150–400)
RBC: 2.74 MIL/uL — ABNORMAL LOW (ref 4.22–5.81)
RDW: 17.4 % — ABNORMAL HIGH (ref 11.5–15.5)
WBC: 6.5 10*3/uL (ref 4.0–10.5)
nRBC: 0 % (ref 0.0–0.2)

## 2022-06-23 MED ORDER — FUROSEMIDE 10 MG/ML IJ SOLN
40.0000 mg | Freq: Once | INTRAMUSCULAR | Status: AC
  Administered 2022-06-23: 40 mg via INTRAVENOUS
  Filled 2022-06-23: qty 4

## 2022-06-23 MED ORDER — APIXABAN 2.5 MG PO TABS
2.5000 mg | ORAL_TABLET | Freq: Two times a day (BID) | ORAL | Status: DC
  Administered 2022-06-23 – 2022-06-24 (×3): 2.5 mg via ORAL
  Filled 2022-06-23 (×3): qty 1

## 2022-06-23 NOTE — Progress Notes (Addendum)
OT Cancellation/Discharge Note  Patient Details Name: [name] MRN: [id number] DOB: 08-Feb-1923   Cancelled Treatment:    Reason Eval/Treat Not Completed: (New orders received, pt was eval by OT on 06/21/22 and was d/c at Mod I level for ADL's and functional mobility. Chart was reviewed, Pt was screened and no new needs were identified at this time. Pt denies acute OT needs at this time. Also spoke with pt RN whom stated that no new needs have been identified. Will sign off acute OT.)  Almyra Deforest, OTR/L 06/23/2022, 8:16 AM

## 2022-06-23 NOTE — Plan of Care (Signed)
  Problem: Education: Goal: Knowledge of General Education information will improve Description: Including pain rating scale, medication(s)/side effects and non-pharmacologic comfort measures Outcome: Progressing   Problem: Clinical Measurements: Goal: Ability to maintain clinical measurements within normal limits will improve Outcome: Progressing   

## 2022-06-23 NOTE — Progress Notes (Signed)
Mobility Specialist - Progress Note   06/23/22 1500  Mobility  Activity Ambulated with assistance in hallway  Level of Assistance Standby assist, set-up cues, supervision of patient - no hands on  Assistive Device None  Distance Ambulated (ft) 600 ft  Activity Response Tolerated well  Mobility Referral Yes  $Mobility charge 1 Mobility    Pt received in recliner and agreeable. No complaints on walk. Left in room w/ family present and all needs met.   [place] Specialist Please contact via SecureChat or Rehab office at [phone]

## 2022-06-23 NOTE — Progress Notes (Signed)
Rounding Note   Patient Name: [name] Date of Encounter: 06/23/2022  [place] Cardiologist: Sinclair Grooms, MD (Inactive)   Subjective   Some patchy airspace opacities on CXR - possibly pneumonia (no fever or leukocytosis). Started on antibiotics. No further bleeding. Creatinine stable -off diuretics.  Inpatient Medications    Scheduled Meds:  amLODipine  10 mg Oral Daily   atorvastatin  40 mg Oral QHS   ezetimibe  5 mg Oral QHS   finasteride  5 mg Oral Q1200   hydrALAZINE  25 mg Oral TID   isosorbide mononitrate  60 mg Oral Daily   latanoprost  1 drop Both Eyes QHS   pantoprazole  20 mg Oral Daily   Continuous Infusions:  cefTRIAXone (ROCEPHIN)  IV 1 g (06/22/22 2245)   PRN Meds: alum & mag hydroxide-simeth, nitroGLYCERIN, traMADol   Vital Signs    Vitals:   06/22/22 2014 06/23/22 0028 06/23/22 0549 06/23/22 0719  BP: 135/71 (!) 143/49 (!) 125/45 (!) 140/57  Pulse: 60 75 63 67  Resp: '18 18 18 17  '$ Temp: 98.2 F (36.8 C) 98.1 F (36.7 C) 98.1 F (36.7 C) 98.3 F (36.8 C)  TempSrc: Oral Oral Oral Oral  SpO2: 93% 91% 93% 93%  Weight:   73.5 kg   Height:        Intake/Output Summary (Last 24 hours) at 06/23/2022 0841 Last data filed at 06/23/2022 0600 Gross per 24 hour  Intake 519.87 ml  Output 200 ml  Net 319.87 ml      06/23/2022    5:49 AM 06/22/2022    4:50 AM 06/21/2022    4:45 AM  Last 3 Weights  Weight (lbs) 162 lb 0.6 oz 160 lb 15 oz 162 lb 4.1 oz  Weight (kg) 73.5 kg 73 kg 73.6 kg      Telemetry    Atrial fibrillation - rates improved  ECG    N/A  Physical Exam   GEN: Elderly Caucasian male resting comfortably in no acute distress. No acute distress.   Neck: No JVD. Cardiac: Irregularly irregular rhythm with normal rate. I-II/VI systolic murmurs. No rubs or gallops. Respiratory: Clear to auscultation bilaterally. No wheezes, rhonchi, or rales. GI: Soft, non-distended, and non-tender.  MS: 1+ pitting edema of  bilateral lower extremity. No deformity. Skin: Warm and dry. Neuro:  No focal deficits. Psych: Normal affect. Responds appropriately.  Labs    High Sensitivity Troponin:   Recent Labs  Lab 06/19/22 0819 06/19/22 1019 06/20/22 0543  TROPONINIHS 162* 98* 877*     Chemistry Recent Labs  Lab 06/21/22 0032 06/22/22 0041 06/23/22 0039  NA 143 144 144  K 3.4* 3.7 3.7  CL 110 112* 112*  CO2 19* 20* 19*  GLUCOSE 123* 115* 110*  BUN 52* 55* 56*  CREATININE 2.03* 2.19* 2.13*  CALCIUM 8.5* 8.3* 8.4*  MG 2.0  --   --   GFRNONAA 29* 27* 28*  ANIONGAP '14 12 13    '$ Lipids No results for input(s): "CHOL", "TRIG", "HDL", "LABVLDL", "LDLCALC", "CHOLHDL" in the last 168 hours.  Hematology Recent Labs  Lab 06/21/22 0032 06/22/22 0041 06/23/22 0039  WBC 7.3 6.9 6.5  RBC 2.78* 2.92* 2.74*  HGB 8.3* 8.6* 8.2*  HCT 24.7* 26.4* 24.5*  MCV 88.8 90.4 89.4  MCH 29.9 29.5 29.9  MCHC 33.6 32.6 33.5  RDW 16.9* 17.2* 17.4*  PLT 226 228 236   Thyroid No results for input(s): "TSH", "FREET4" in the last  168 hours.  BNP Recent Labs  Lab 06/19/22 0830 06/22/22 0041  BNP 786.5* 672.7*    DDimer No results for input(s): "DDIMER" in the last 168 hours.   Radiology    DG Chest Port 1 View  Result Date: 06/22/2022 CLINICAL DATA:  Dyspnea EXAM: PORTABLE CHEST 1 VIEW COMPARISON:  Chest x-ray 06/19/2022 FINDINGS: Heart is enlarged. Loop recorder device is present. Sternotomy wires and mediastinal clips are present. There are patchy bibasilar airspace opacities. There is no pleural effusion or pneumothorax. No acute fractures are seen. IMPRESSION: 1. Patchy bibasilar airspace opacities worrisome for infection. 2. Cardiomegaly. Electronically Signed   By: Ronney Asters M.D.   On: 06/22/2022 15:40    Cardiac Studies   Echocardiogram 06/20/2022: Impression:  1. Left ventricular ejection fraction, by estimation, is 60 to 65%. The  left ventricle has normal function. The left ventricle has no  regional  wall motion abnormalities. There is moderate left ventricular hypertrophy.  Left ventricular diastolic  parameters are indeterminate.   2. Ventricular septum is flattened is systole and diastole suggesting RV  pressure and volume overload. . Right ventricular systolic function is  moderately reduced. The right ventricular size is severely enlarged. There  is severely elevated pulmonary  artery systolic pressure.   3. Left atrial size was moderately dilated.   4. Right atrial size was severely dilated.   5. The mitral valve is abnormal. Mild mitral valve regurgitation. No  evidence of mitral stenosis.   6. There is hepatic systolic flow reversal consistent with severe TR..  The tricuspid valve is abnormal. Tricuspid valve regurgitation is severe.   7. The aortic valve is tricuspid. There is mild calcification of the  aortic valve. There is mild thickening of the aortic valve. Aortic valve  regurgitation is not visualized. No aortic stenosis is present.   8. The inferior vena cava is dilated in size with <50% respiratory  variability, suggesting right atrial pressure of 15 mmHg.   Patient Profile     89 y.o. male with a history of CAD s/p CABG x4 (LIMA to LAD, SVG to Diag, SVG OM, and SVG to distal RCA) in 2000, paroxysmal atrial fibrillation on Eliquis, RBBB, CVA, hypertension, hyperlipidemia, GERD, and BPH who was admitted on 06/19/2022 with chest pain and hematuria. Also found to be in acute CHF and found to have an elevated troponin.   Assessment & Plan    Chest Pain CAD s/p CABG Patient presented with bandlike upper abdominal/ lower chest discomfort. EKG showed no acute ischemic changes. High-sensitivity troponin 162 >> 98 >> 877. Echo showed LVEF of 60-65% with no regional wall motion abnormalities.  - He had some recurrent chest pain last night that initially improved with Maalox and sublingual Nitro but then returned and subsequently resolved spontaneously. Currently chest  pain free.  - Continue Amlodipine '10mg'$  daily.  - Will increase Imdur to '60mg'$  daily.  - No beta-blocker due to baseline bradycardia. - No anginal pain overnight, now on antibiotics for possible pneumonia - suspect CHF more likely   Acute Diastolic CHF RV Failure Patient presented with lower extremity edema but denied any worsening shortness of breath or orthopnea,. BNP elevated at 786.5. Chest x-ray showed lower lob airspace disease/pneumonia with consolidation/ atelectasis and probably small pleural effusion. Echo showed LVEF of 60-65% with moderate LVH, severely enlarged RV with moderately reduced RV function and flattened ventricular septum in systole and diastole suggesting RV pressure and volume overload. He was initially started on IV Lasix but this  was stopped on 3/11 due to worsening renal function . Net negative 2.1 L this admission. Weight down 2lbs from yesterday and 9lbs from admission. Creatinine continues to slowly increase. - He continues to have some lower extremity edema but otherwise looked euvolemic on exam. However, he does described some shortness of breath and orthopnea overnight. - BNP remains elevated at 627. - Give lasix 40 mg IV x 1 today. - RV failure may be from severe TR (previously moderate). He is not hypoxic, tachycardic, or tachypneic and has been on Eliquis for atrial fibrillation so think PE is unlikely.   Paroxysmal Atrial Fibrillation Still in atrial fibrillation with slow ventricular rate. Rates mostly in the 50s to 70s (not in the 40s as much). - Not on any AV nodal agents.  - He was on Timolol eye drops for his glaucoma but these have been stopped and rates have improved some off this. - On chronic anticoagulation with Eliquis at home but this is on hold in setting of hematuria and acute blood loss anemia. Would plan to restart possibly today after discussing with urology to monitor for recurrent bleeding.   Severe Tricuspid Regurgitation Noted on Echo this  admission. Prior Echo in 2016 showed moderate TR. - Continue to manage conservatively given advanced age.   Hypertension BP mostly well controlled over the last 24 hours. - Continue Amlodipine '10mg'$  daily.  - Continue Hydralazine '25mg'$  three times daily. - Will increase Imdur to '60mg'$  daily for additional antianginal affect.   Hyperlipidemia - Continue home Lipitor '40mg'$  daily and Zetia '5mg'$  daily.   Acute on CKD Stage III Baseline creatinine around 1.5-1.6. Creatinine 1.82 on admission. Trended down to 1.71 yesterday but increased to 2.03 yesterday and now 2.19 today. Renal ultrasound this admission suggestive of chronic medical renal disease but no hydronephrosis.  - Continue to hold diuretics. - Continue to avoid Nephrotoxic agents.  - Continue to monitor closely.   Hypokalemia Stable at 3.7   Hematuria Iron Deficiency Anemia due to Chronic Blood Loss Presented with gross hematuria for 3 weeks. Initially diagnosed with a UTI and treated with 2 different antibiotics. Hemoglobin was 8.1 on admission, down from 10.3 in 02/2022. S/p 1 unit of PRBCs on 3/9 and Degarelix on 3/10. - He denies any recurrent hematuria since 3/10. - Hemoglobin stable - Urology following and no plans for acute Urologic intervention at this time.   ?Pneumonia - based on imaging - no fever, cough or leukocytosis - on ceftriaxone, doubt that he has clinical pneumonia. Suspect CHF - would stop antibiotics.    For questions or updates, please contact [place] Please consult [email or web address] for contact info under   Pixie Casino, MD, FACC, [place] Director of the Advanced Lipid Disorders &  Cardiovascular Risk Reduction Clinic Diplomate of the American Board of Clinical Lipidology Attending Cardiologist  Direct Dial: [phone]  Fax: [phone]  Website:  [email or web address]  Pixie Casino, MD  06/23/2022, 8:41 AM

## 2022-06-23 NOTE — Care Management Important Message (Signed)
Important Message  Patient Details  Name: [name] MRN: [id number] Date of Birth: 23-Jan-1923   Medicare Important Message Given:  Yes     [name] 06/23/2022, 8:32 AM

## 2022-06-23 NOTE — Plan of Care (Signed)
  Problem: Clinical Measurements: Goal: Will remain free from infection Outcome: Progressing   Problem: Coping: Goal: Level of anxiety will decrease Outcome: Progressing   Problem: Safety: Goal: Ability to remain free from injury will improve Outcome: Progressing   

## 2022-06-24 DIAGNOSIS — I4821 Permanent atrial fibrillation: Secondary | ICD-10-CM

## 2022-06-24 LAB — CBC
HCT: 24.8 % — ABNORMAL LOW (ref 39.0–52.0)
Hemoglobin: 8.2 g/dL — ABNORMAL LOW (ref 13.0–17.0)
MCH: 29.8 pg (ref 26.0–34.0)
MCHC: 33.1 g/dL (ref 30.0–36.0)
MCV: 90.2 fL (ref 80.0–100.0)
Platelets: 233 10*3/uL (ref 150–400)
RBC: 2.75 MIL/uL — ABNORMAL LOW (ref 4.22–5.81)
RDW: 17.3 % — ABNORMAL HIGH (ref 11.5–15.5)
WBC: 5.8 10*3/uL (ref 4.0–10.5)
nRBC: 0 % (ref 0.0–0.2)

## 2022-06-24 LAB — BASIC METABOLIC PANEL
Anion gap: 9 (ref 5–15)
BUN: 57 mg/dL — ABNORMAL HIGH (ref 8–23)
CO2: 21 mmol/L — ABNORMAL LOW (ref 22–32)
Calcium: 8.3 mg/dL — ABNORMAL LOW (ref 8.9–10.3)
Chloride: 111 mmol/L (ref 98–111)
Creatinine, Ser: 2.23 mg/dL — ABNORMAL HIGH (ref 0.61–1.24)
GFR, Estimated: 26 mL/min — ABNORMAL LOW (ref >60)
Glucose, Bld: 111 mg/dL — ABNORMAL HIGH (ref 70–99)
Potassium: 3.7 mmol/L (ref 3.5–5.1)
Sodium: 141 mmol/L (ref 135–145)

## 2022-06-24 MED ORDER — APIXABAN 2.5 MG PO TABS: 2.5000 mg | ORAL_TABLET | Freq: Two times a day (BID) | ORAL | 0 refills | Status: DC

## 2022-06-24 MED ORDER — POTASSIUM CHLORIDE CRYS ER 20 MEQ PO TBCR: 20.0000 meq | EXTENDED_RELEASE_TABLET | Freq: Every day | ORAL | 0 refills | Status: DC

## 2022-06-24 MED ORDER — POTASSIUM CHLORIDE CRYS ER 20 MEQ PO TBCR
40.0000 meq | EXTENDED_RELEASE_TABLET | Freq: Once | ORAL | Status: AC
  Administered 2022-06-24: 40 meq via ORAL
  Filled 2022-06-24: qty 2

## 2022-06-24 MED ORDER — FUROSEMIDE 40 MG PO TABS
40.0000 mg | ORAL_TABLET | Freq: Every day | ORAL | Status: DC
  Administered 2022-06-24: 40 mg via ORAL
  Filled 2022-06-24: qty 1

## 2022-06-24 MED ORDER — ISOSORBIDE MONONITRATE ER 60 MG PO TB24: 60.0000 mg | ORAL_TABLET | Freq: Every day | ORAL | 0 refills | Status: DC

## 2022-06-24 MED ORDER — FUROSEMIDE 40 MG PO TABS: 40.0000 mg | ORAL_TABLET | Freq: Every day | ORAL | 0 refills | Status: DC

## 2022-06-24 MED ORDER — HYDRALAZINE HCL 25 MG PO TABS: 25.0000 mg | ORAL_TABLET | Freq: Three times a day (TID) | ORAL | 0 refills | Status: DC

## 2022-06-24 NOTE — Progress Notes (Addendum)
Rounding Note   Patient Name: [name] Date of Encounter: 06/24/2022  [place] Cardiologist: Belva Crome III, MD (Inactive)   Subjective   Recorded net positive yesterday despite diuresis, weight unchanged. He said he did not capture all of the urine. Creatinine is slightly worse. Eliquis has been restarted. He denies any hematuria. Wants to be at home to take care of his companion.  Inpatient Medications    Scheduled Meds:  amLODipine  10 mg Oral Daily   apixaban  2.5 mg Oral BID   atorvastatin  40 mg Oral QHS   ezetimibe  5 mg Oral QHS   finasteride  5 mg Oral Q1200   hydrALAZINE  25 mg Oral TID   isosorbide mononitrate  60 mg Oral Daily   latanoprost  1 drop Both Eyes QHS   pantoprazole  20 mg Oral Daily   Continuous Infusions:   PRN Meds: alum & mag hydroxide-simeth, nitroGLYCERIN, traMADol   Vital Signs    Vitals:   06/24/22 0020 06/24/22 0628 06/24/22 0755 06/24/22 0844  BP: (!) 133/48 (!) 122/44 (!) 129/51 (!) 144/50  Pulse: 70 61 66 68  Resp: '16 16 15 16  '$ Temp: (!) 97.4 F (36.3 C) (!) 97.4 F (36.3 C) 97.9 F (36.6 C) 98.3 F (36.8 C)  TempSrc: Oral Oral Oral Oral  SpO2: 94% 94% 94% 96%  Weight:  73.6 kg    Height:        Intake/Output Summary (Last 24 hours) at 06/24/2022 0942 Last data filed at 06/24/2022 0840 Gross per 24 hour  Intake 780 ml  Output 775 ml  Net 5 ml      06/24/2022    6:28 AM 06/23/2022    5:49 AM 06/22/2022    4:50 AM  Last 3 Weights  Weight (lbs) 162 lb 3.2 oz 162 lb 0.6 oz 160 lb 15 oz  Weight (kg) 73.573 kg 73.5 kg 73 kg      Telemetry    Atrial fibrillation - rates improved, personally reviewed  ECG    N/A  Physical Exam   GEN: Elderly Caucasian male resting comfortably in no acute distress. No acute distress.   Neck: No JVD. Cardiac: Irregularly irregular rhythm with normal rate. I-II/VI systolic murmurs. No rubs or gallops. Respiratory: Clear to auscultation bilaterally. No wheezes,  rhonchi, or rales. GI: Soft, non-distended, and non-tender.  MS: trace pitting edema of the right lower extremity. No deformity. Chronic venous stasis changes Skin: Warm and dry. Neuro:  No focal deficits. Psych: Normal affect. Responds appropriately.  Labs    High Sensitivity Troponin:   Recent Labs  Lab 06/19/22 0819 06/19/22 1019 06/20/22 0543  TROPONINIHS 162* 98* 877*     Chemistry Recent Labs  Lab 06/21/22 0032 06/22/22 0041 06/23/22 0039 06/24/22 0038  NA 143 144 144 141  K 3.4* 3.7 3.7 3.7  CL 110 112* 112* 111  CO2 19* 20* 19* 21*  GLUCOSE 123* 115* 110* 111*  BUN 52* 55* 56* 57*  CREATININE 2.03* 2.19* 2.13* 2.23*  CALCIUM 8.5* 8.3* 8.4* 8.3*  MG 2.0  --   --   --   GFRNONAA 29* 27* 28* 26*  ANIONGAP '14 12 13 9    '$ Lipids No results for input(s): "CHOL", "TRIG", "HDL", "LABVLDL", "LDLCALC", "CHOLHDL" in the last 168 hours.  Hematology Recent Labs  Lab 06/22/22 0041 06/23/22 0039 06/24/22 0038  WBC 6.9 6.5 5.8  RBC 2.92* 2.74* 2.75*  HGB 8.6* 8.2* 8.2*  HCT 26.4* 24.5* 24.8*  MCV 90.4 89.4 90.2  MCH 29.5 29.9 29.8  MCHC 32.6 33.5 33.1  RDW 17.2* 17.4* 17.3*  PLT 228 236 233   Thyroid No results for input(s): "TSH", "FREET4" in the last 168 hours.  BNP Recent Labs  Lab 06/19/22 0830 06/22/22 0041  BNP 786.5* 672.7*    DDimer No results for input(s): "DDIMER" in the last 168 hours.   Radiology    DG Chest Port 1 View  Result Date: 06/22/2022 CLINICAL DATA:  Dyspnea EXAM: PORTABLE CHEST 1 VIEW COMPARISON:  Chest x-ray 06/19/2022 FINDINGS: Heart is enlarged. Loop recorder device is present. Sternotomy wires and mediastinal clips are present. There are patchy bibasilar airspace opacities. There is no pleural effusion or pneumothorax. No acute fractures are seen. IMPRESSION: 1. Patchy bibasilar airspace opacities worrisome for infection. 2. Cardiomegaly. Electronically Signed   By: Ronney Asters M.D.   On: 06/22/2022 15:40    Cardiac Studies    Echocardiogram 06/20/2022: Impression:  1. Left ventricular ejection fraction, by estimation, is 60 to 65%. The  left ventricle has normal function. The left ventricle has no regional  wall motion abnormalities. There is moderate left ventricular hypertrophy.  Left ventricular diastolic  parameters are indeterminate.   2. Ventricular septum is flattened is systole and diastole suggesting RV  pressure and volume overload. . Right ventricular systolic function is  moderately reduced. The right ventricular size is severely enlarged. There  is severely elevated pulmonary  artery systolic pressure.   3. Left atrial size was moderately dilated.   4. Right atrial size was severely dilated.   5. The mitral valve is abnormal. Mild mitral valve regurgitation. No  evidence of mitral stenosis.   6. There is hepatic systolic flow reversal consistent with severe TR..  The tricuspid valve is abnormal. Tricuspid valve regurgitation is severe.   7. The aortic valve is tricuspid. There is mild calcification of the  aortic valve. There is mild thickening of the aortic valve. Aortic valve  regurgitation is not visualized. No aortic stenosis is present.   8. The inferior vena cava is dilated in size with <50% respiratory  variability, suggesting right atrial pressure of 15 mmHg.   Patient Profile     89 y.o. male with a history of CAD s/p CABG x4 (LIMA to LAD, SVG to Diag, SVG OM, and SVG to distal RCA) in 2000, paroxysmal atrial fibrillation on Eliquis, RBBB, CVA, hypertension, hyperlipidemia, GERD, and BPH who was admitted on 06/19/2022 with chest pain and hematuria. Also found to be in acute CHF and found to have an elevated troponin.   Assessment & Plan    Chest Pain CAD s/p CABG Patient presented with bandlike upper abdominal/ lower chest discomfort. EKG showed no acute ischemic changes. High-sensitivity troponin 162 >> 98 >> 877. Echo showed LVEF of 60-65% with no regional wall motion  abnormalities.  - He had some recurrent chest pain last night that initially improved with Maalox and sublingual Nitro but then returned and subsequently resolved spontaneously. Currently chest pain free.  - Continue Amlodipine '10mg'$  daily.  - Will increase Imdur to '60mg'$  daily.  - No beta-blocker due to baseline bradycardia. Stopped timolol for this reason. - No further chest pain overnight   Acute Diastolic CHF RV Failure Patient presented with lower extremity edema but denied any worsening shortness of breath or orthopnea,. BNP elevated at 786.5. Chest x-ray showed lower lob airspace disease/pneumonia with consolidation/ atelectasis and probably small pleural effusion. Echo showed LVEF  of 60-65% with moderate LVH, severely enlarged RV with moderately reduced RV function and flattened ventricular septum in systole and diastole suggesting RV pressure and volume overload. He was initially started on IV Lasix but this was stopped on 3/11 due to worsening renal function . Net negative 2.1 L this admission. Weight down 2lbs from yesterday and 9lbs from admission. Creatinine continues to slowly increase. - I think he responded to IV lasix - negative and we did not capture it all. Will transition to maintenance dose oral lasix. Would not restart HCTZ. He wants to go home.   Paroxysmal Atrial Fibrillation Still in atrial fibrillation with slow ventricular rate. Rates mostly in the 50s to 70s (not in the 40s as much). - Not on any AV nodal agents.  - He was on Timolol eye drops for his glaucoma but these have been stopped and rates have improved some off this. - Eliquis restarted - no GU bleeding noted.   Severe Tricuspid Regurgitation Noted on Echo this admission. Prior Echo in 2016 showed moderate TR. - Continue to manage conservatively given advanced age.   Hypertension BP mostly well controlled over the last 24 hours. - Continue Amlodipine '10mg'$  daily.  - Continue Hydralazine '25mg'$  three times  daily. - Continue Imdur to '60mg'$  daily for additional antianginal affect.   Hyperlipidemia - Continue home Lipitor '40mg'$  daily and Zetia '5mg'$  daily.   Acute on CKD Stage III Baseline creatinine around 1.5-1.6. Creatinine 1.82 on admission. Trended down to 1.71 yesterday but increased to 2.03 yesterday and now 2.19 today. Renal ultrasound this admission suggestive of chronic medical renal disease but no hydronephrosis.  - Continue to hold diuretics. - Continue to avoid Nephrotoxic agents.  - Continue to monitor closely.   Hypokalemia Stable at 3.7   Hematuria Iron Deficiency Anemia due to Chronic Blood Loss Presented with gross hematuria for 3 weeks. Initially diagnosed with a UTI and treated with 2 different antibiotics. Hemoglobin was 8.1 on admission, down from 10.3 in 02/2022. S/p 1 unit of PRBCs on 3/9 and Degarelix on 3/10. - No further hematuria, back on Eliquis  Would be ok to d/c home today from my standpoint. He should follow-up with a [facility] at [facility] and will transition to a new cardiologist in that office since Dr. Tamala Julian has retired.  [place] will sign off.   Medication Recommendations:  as above Other recommendations (labs, testing, etc):  none Follow up as an outpatient:  We will arrange follow-up   For questions or updates, please contact [place] Please consult [email or web address] for contact info under   Pixie Casino, MD, FACC, [place] Director of the Advanced Lipid Disorders &  Cardiovascular Risk Reduction Clinic Diplomate of the American Board of Clinical Lipidology Attending Cardiologist  Direct Dial: [phone]  Fax: [phone]  Website:  [email or web address]  Pixie Casino, MD  06/24/2022, 9:42 AM

## 2022-06-24 NOTE — Plan of Care (Signed)
  Problem: Education: Goal: Knowledge of General Education information will improve Description: Including pain rating scale, medication(s)/side effects and non-pharmacologic comfort measures Outcome: Adequate for Discharge   Problem: Health Behavior/Discharge Planning: Goal: Ability to manage health-related needs will improve Outcome: Adequate for Discharge   Problem: Clinical Measurements: Goal: Ability to maintain clinical measurements within normal limits will improve Outcome: Adequate for Discharge Goal: Will remain free from infection Outcome: Adequate for Discharge Goal: Diagnostic test results will improve Outcome: Adequate for Discharge Goal: Respiratory complications will improve Outcome: Adequate for Discharge Goal: Cardiovascular complication will be avoided Outcome: Adequate for Discharge   Problem: Activity: Goal: Risk for activity intolerance will decrease Outcome: Adequate for Discharge   Problem: Coping: Goal: Level of anxiety will decrease Outcome: Adequate for Discharge   Problem: Elimination: Goal: Will not experience complications related to bowel motility Outcome: Adequate for Discharge Goal: Will not experience complications related to urinary retention Outcome: Adequate for Discharge   Problem: Pain Managment: Goal: General experience of comfort will improve Outcome: Adequate for Discharge   Problem: Safety: Goal: Ability to remain free from injury will improve Outcome: Adequate for Discharge   Problem: Skin Integrity: Goal: Risk for impaired skin integrity will decrease Outcome: Adequate for Discharge   

## 2022-06-24 NOTE — TOC Progression Note (Signed)
Transition of Care [facility]) - Progression Note    Patient Details  Name: [name] MRN: [id number] Date of Birth: May 04, 1922  [facility] CM/SW Contact  Zenon Mayo, RN Phone Number: 06/24/2022, 10:33 AM  Clinical Narrative:    Patient is for dc today, he has no needs.   Expected Discharge Plan: Home/Self Care    Expected Discharge Plan and Services         Expected Discharge Date: 06/24/22                                     Social Determinants of Health (SDOH) Interventions SDOH Screenings   Tobacco Use: Low Risk  (06/19/2022)    Readmission Risk Interventions     No data to display

## 2022-06-24 NOTE — Progress Notes (Addendum)
Explained discharge instructions to patient. Reviewed follow up appointment and next medication administration times. Also reviewed education. Patient verbalized having an understanding for instructions given. All belongings are in the patient's possession. IV and telemetry were removed by floor staff prior to my explaining discharge instructions. No other needs verbalized. Transported downstairs for discharge.

## 2022-06-24 NOTE — Discharge Summary (Signed)
Physician Discharge Summary  [name] [id number] DOB: 1923-02-19 DOA: 06/19/2022  PCP: [name], NP  Admit date: 06/19/2022 Discharge date: 06/24/2022  Time spent: 45 minutes  Recommendations for Outpatient Follow-up:  General cardiology follow-up on 3/21, please check BMP at follow-up and titrate diuretics   Discharge Diagnoses:  Principal Problem:   Acute on chronic diastolic CHF (congestive heart failure) (HCC) Active Problems:   Coronary atherosclerosis of artery bypass graft   Hematuria BPH   Acute kidney injury superimposed on chronic kidney disease (HCC)   Paroxysmal atrial fibrillation ([place])   Essential hypertension   Iron deficiency anemia due to chronic blood loss   Discharge Condition: Improved  Diet recommendation: Low-sodium d  Filed Weights   06/22/22 0450 06/23/22 0549 06/24/22 0628  Weight: 73 kg 73.5 kg 73.6 kg    History of present illness:  89/M w/CAD, sp CABG 1990's, hypertension, paroxysmal atrial fibrillation, CKD and BPH sp TURP who presented with hematuria, dyspnea and chest pain. Reported 2 days of dyspnea on exertion, and lower extremity edema, with the night prior experiencing chest pressure. - He had 2 episodes of urinary tract infection, associated with hematuria, treated as outpatient with oral antibiotics. Hematuria has been persistent for the last 7 days -Labs w/ bun 53 cr 1,82, BNP 786, troponin 162, 98, 877  -Urine analysis with hematuria, not able to measure other parameters.  -CXR-cardiomegaly, bilateral hilar vascular congestion, cephalization of the vasculature, fluid in the right fissure  Hospital Course:   Acute on chronic diastolic CHF  RV failure, Pulmonary hypertension.  Severe TR -Echo with EF 60 to 65%, moderate LVH,  RV systolic function with moderate reduction, RV cavity with severe enlargement, severe tricuspid regurgitation.  -diuresed with IV lasix, 3L negative, weight down 7lbs, followed by cardiology this  admission, hospitalization complicated by fluctuation in kidney function, now stabilizing, oral Lasix 40 Mg daily resumed at discharge per cards recommendations -After load reduction with hydralazine and isosorbide. -Cardiology follow-up arranged for 3/21, anticipate need for further diuretic titration -Conservative management with advanced age and frailty   CAD, CABG Demand ischemia, no acute coronary syndrome.  -Continue atorvastatin.    Acute kidney injury superimposed on chronic kidney disease (HCC) CKD stage 3b Hypokalemia.  -Creatinine stabilizing, 2.2 at discharge   Hematuria Acute blood loss anemia due to significant bleed likely from massive BPH changes exacerbated by recent cystitis.  -s/p Degarelix 3/10, per urology recommendations.  -Eliquis held initially, subsequently resumed yesterday morning after resolution of hematuria, no further hematuria noted, follow-up with Dr. Alinda Money   Paroxysmal atrial fibrillation [facility]) -See discussion above, resumed Eliquis yesterday   Iron deficiency anemia due to chronic blood loss Iron panel with serum iron at 18, TIBC 262, transferrin saturation 7 and ferritin 38, sp IV iron    Essential hypertension Continue  amlodipine, hydralazine and isosorbide.   Discharge Exam: Vitals:   06/24/22 0844 06/24/22 1102  BP: (!) 144/50 (!) 138/42  Pulse: 68 69  Resp: 16 16  Temp: 98.3 F (36.8 C) 98.1 F (36.7 C)  SpO2: 96% 93%   Gen: Awake, Alert, Oriented X 3,  HEENT: + JVD Lungs: Decreased breath sounds to bases CVS: S1S2/RRR Abd: soft, Non tender, non distended, BS present Extremities: 1 + edema Skin: no new rashes on exposed skin    Discharge Instructions   Discharge Instructions     Diet - low sodium heart healthy   Complete by: As directed    Increase activity slowly   Complete  by: As directed       Allergies as of 06/24/2022       Reactions   Monosodium Glutamate Anaphylaxis   Lisinopril Other (See Comments)         Medication List     STOP taking these medications    hydrochlorothiazide 25 MG tablet Commonly known as: HYDRODIURIL   losartan 100 MG tablet Commonly known as: COZAAR       TAKE these medications    amLODipine 10 MG tablet Commonly known as: NORVASC Take 10 mg by mouth daily.   apixaban 2.5 MG Tabs tablet Commonly known as: Eliquis Take 1 tablet (2.5 mg total) by mouth 2 (two) times daily. What changed:  medication strength how much to take   atorvastatin 40 MG tablet Commonly known as: LIPITOR Take 40 mg by mouth at bedtime.   bacitracin ointment Apply 1 Application topically 2 (two) times daily.   ezetimibe 10 MG tablet Commonly known as: ZETIA Take 5 mg by mouth at bedtime.   ferrous sulfate 325 (65 FE) MG tablet Take 325 mg by mouth daily with breakfast.   finasteride 5 MG tablet Commonly known as: PROSCAR Take 5 mg by mouth daily at 12 noon.   furosemide 40 MG tablet Commonly known as: LASIX Take 1 tablet (40 mg total) by mouth daily.   hydrALAZINE 25 MG tablet Commonly known as: APRESOLINE Take 1 tablet (25 mg total) by mouth 3 (three) times daily.   isosorbide mononitrate 60 MG 24 hr tablet Commonly known as: IMDUR Take 1 tablet (60 mg total) by mouth daily. Start taking on: June 25, 2022   multivitamin with minerals Tabs tablet Take 1 tablet by mouth daily.   pantoprazole 20 MG tablet Commonly known as: PROTONIX Take 20 mg by mouth daily.   potassium chloride SA 20 MEQ tablet Commonly known as: KLOR-CON M Take 1 tablet (20 mEq total) by mouth daily.   PRESERVISION AREDS 2 PO Take 1 capsule by mouth 2 (two) times daily.   traMADol 50 MG tablet Commonly known as: ULTRAM Take 50 mg by mouth every 6 (six) hours as needed for moderate pain.       Allergies  Allergen Reactions   Monosodium Glutamate Anaphylaxis   Lisinopril Other (See Comments)    Follow-up Information     [name]., NP Follow up.   Specialty:  Cardiology Why: [place] location - cardiology follow-up scheduled on Thursday Jul 01, 2022 2:45 PM (Arrive by 2:30 PM). Jaquelyn Bitter is one of our nurse practitioners that works with Dr. Debara Pickett, Dr. Tamala Julian and our [facility] team. Contact information: [address] [place] [address] [phone]         [name], NP Follow up.   Why: Please follow up in a week. Contact information: [place] [address] [phone]                  The results of significant diagnostics from this hospitalization (including imaging, microbiology, ancillary and laboratory) are listed below for reference.    Significant Diagnostic Studies: DG Chest Port 1 View  Result Date: 06/22/2022 CLINICAL DATA:  Dyspnea EXAM: PORTABLE CHEST 1 VIEW COMPARISON:  Chest x-ray 06/19/2022 FINDINGS: Heart is enlarged. Loop recorder device is present. Sternotomy wires and mediastinal clips are present. There are patchy bibasilar airspace opacities. There is no pleural effusion or pneumothorax. No acute fractures are seen. IMPRESSION: 1. Patchy bibasilar airspace  opacities worrisome for infection. 2. Cardiomegaly. Electronically Signed   By: Ronney Asters M.D.   On: 06/22/2022 15:40   ECHOCARDIOGRAM COMPLETE  Result Date: 06/20/2022    ECHOCARDIOGRAM REPORT   Patient Name:   [name] Date of Exam: 06/20/2022 Medical Rec #:  [id number]       Height:       71.0 in Accession #:    [id number]      Weight:       170.0 lb Date of Birth:  1923-03-30       BSA:          1.968 m Patient Age:    65 years        BP:           141/53 mmHg Patient Gender: M               HR:           53 bpm. Exam Location:  Inpatient Procedure: 2D Echo, Cardiac Doppler and Color Doppler Indications:    CHF-Acute Diastolic [id number]  History:        Patient has prior history of Echocardiogram examinations, most                 recent 12/05/2014. CHF, TIA, Arrythmias:RBBB; Risk                  Factors:Hypertension, Dyslipidemia and Non-Smoker.  Sonographer:    Wilkie Aye RVT RCS Referring Phys: [id number] [place]  1. Left ventricular ejection fraction, by estimation, is 60 to 65%. The left ventricle has normal function. The left ventricle has no regional wall motion abnormalities. There is moderate left ventricular hypertrophy. Left ventricular diastolic parameters are indeterminate.  2. Ventricular septum is flattened is systole and diastole suggesting RV pressure and volume overload. . Right ventricular systolic function is moderately reduced. The right ventricular size is severely enlarged. There is severely elevated pulmonary artery systolic pressure.  3. Left atrial size was moderately dilated.  4. Right atrial size was severely dilated.  5. The mitral valve is abnormal. Mild mitral valve regurgitation. No evidence of mitral stenosis.  6. There is hepatic systolic flow reversal consistent with severe TR.. The tricuspid valve is abnormal. Tricuspid valve regurgitation is severe.  7. The aortic valve is tricuspid. There is mild calcification of the aortic valve. There is mild thickening of the aortic valve. Aortic valve regurgitation is not visualized. No aortic stenosis is present.  8. The inferior vena cava is dilated in size with <50% respiratory variability, suggesting right atrial pressure of 15 mmHg. FINDINGS  Left Ventricle: Left ventricular ejection fraction, by estimation, is 60 to 65%. The left ventricle has normal function. The left ventricle has no regional wall motion abnormalities. The left ventricular internal cavity size was normal in size. There is  moderate left ventricular hypertrophy. Left ventricular diastolic parameters are indeterminate. Right Ventricle: Ventricular septum is flattened is systole and diastole suggesting RV pressure and volume overload. The right ventricular size is severely enlarged. Right vetricular wall thickness was not well  visualized. Right ventricular systolic function is moderately reduced. There is severely elevated pulmonary artery systolic pressure. The tricuspid regurgitant velocity is 3.91 m/s, and with an assumed right atrial pressure of 15 mmHg, the estimated right ventricular systolic pressure is 123XX123  mmHg. Left Atrium: Left atrial size was moderately dilated. Right Atrium: Right atrial size was severely dilated. Pericardium: There is no evidence of pericardial effusion. Mitral Valve:  The mitral valve is abnormal. There is mild thickening of the mitral valve leaflet(s). There is mild calcification of the mitral valve leaflet(s). Mild mitral annular calcification. Mild mitral valve regurgitation. No evidence of mitral valve stenosis. Tricuspid Valve: There is hepatic systolic flow reversal consistent with severe TR. The tricuspid valve is abnormal. Tricuspid valve regurgitation is severe. No evidence of tricuspid stenosis. Aortic Valve: The aortic valve is tricuspid. There is mild calcification of the aortic valve. There is mild thickening of the aortic valve. There is mild aortic valve annular calcification. Aortic valve regurgitation is not visualized. No aortic stenosis  is present. Aortic valve mean gradient measures 4.0 mmHg. Aortic valve peak gradient measures 8.0 mmHg. Aortic valve area, by VTI measures 2.01 cm. Pulmonic Valve: The pulmonic valve was not well visualized. Pulmonic valve regurgitation is not visualized. No evidence of pulmonic stenosis. Aorta: The aortic root is normal in size and structure. Venous: The inferior vena cava is dilated in size with less than 50% respiratory variability, suggesting right atrial pressure of 15 mmHg. IAS/Shunts: No atrial level shunt detected by color flow Doppler.  LEFT VENTRICLE PLAX 2D LVIDd:         4.90 cm LVIDs:         3.10 cm LV PW:         1.30 cm LV IVS:        1.30 cm LVOT diam:     1.90 cm LV SV:         73 LV SV Index:   37 LVOT Area:     2.84 cm  RIGHT  VENTRICLE             IVC RV Basal diam:  5.70 cm     IVC diam: 2.70 cm RV S prime:     12.80 cm/s TAPSE (M-mode): 1.7 cm LEFT ATRIUM             Index        RIGHT ATRIUM           Index LA diam:        5.40 cm 2.74 cm/m   RA Area:     29.20 cm LA Vol (A2C):   85.4 ml 43.40 ml/m  RA Volume:   92.20 ml  46.86 ml/m LA Vol (A4C):   86.2 ml 43.81 ml/m LA Biplane Vol: 91.2 ml 46.35 ml/m  AORTIC VALVE                    PULMONIC VALVE AV Area (Vmax):    2.21 cm     PV Vmax:       1.05 m/s AV Area (Vmean):   2.23 cm     PV Peak grad:  4.4 mmHg AV Area (VTI):     2.01 cm AV Vmax:           141.00 cm/s AV Vmean:          95.800 cm/s AV VTI:            0.366 m AV Peak Grad:      8.0 mmHg AV Mean Grad:      4.0 mmHg LVOT Vmax:         110.00 cm/s LVOT Vmean:        75.400 cm/s LVOT VTI:          0.259 m LVOT/AV VTI ratio: 0.71  AORTA Ao Root diam: 2.90 cm Ao Arch diam: 2.5 cm MITRAL VALVE  TRICUSPID VALVE MV Area (PHT): 4.30 cm     TR Peak grad:   61.2 mmHg MV Decel Time: 176 msec     TR Vmax:        391.00 cm/s MV E velocity: 109.22 cm/s MV A velocity: 50.27 cm/s   SHUNTS MV E/A ratio:  2.17         Systemic VTI:  0.26 m                             Systemic Diam: 1.90 cm Carlyle Dolly MD Electronically signed by Carlyle Dolly MD Signature Date/Time: 06/20/2022/3:48:03 PM    Final    US RENAL  Result Date: 06/19/2022 CLINICAL DATA:  Hematuria EXAM: RENAL / URINARY TRACT ULTRASOUND COMPLETE COMPARISON:  CT abdomen pelvis 01/18/2019 FINDINGS: Right Kidney: Renal measurements: 9.5 x 5.3 x 5.3 cm = volume: 140.6 mL. Renal cortical thinning. Increased cortical echogenicity. No hydronephrosis. Multiple cysts within the right kidney measuring up to 2.7 cm. Left Kidney: Renal measurements: 10.9 x 4.6 x 5.3 cm = volume: 137.8 mL. Renal cortical thinning. Increased cortical echogenicity. No hydronephrosis. Multiple cysts measuring up to 2.6 cm in the midpole. Bladder: Suggestion of echogenic layering  debris within the urinary bladder lumen. Other: Prostate is markedly enlarged and impresses upon the bladder base. IMPRESSION: 1. No hydronephrosis. 2. Bilateral renal cortical thinning and increased cortical echogenicity suggestive of chronic medical renal disease. 3. Suggestion of echogenic layering debris within the urinary bladder lumen. Correlate with urinalysis. 4. Markedly enlarged prostate. Electronically Signed   By: Lovey Newcomer M.D.   On: 06/19/2022 13:48   DG Chest 2 View  Result Date: 06/19/2022 CLINICAL DATA:  Chest pain EXAM: CHEST - 2 VIEW COMPARISON:  12/04/2014 FINDINGS: Telemetry leads overlie the chest. Cardiomegaly, CABG changes and cardiac recorder again noted. On the view, LOWER lobe airspace disease and consolidation/atelectasis noted. A probable small effusion is present. There is no evidence of pneumothorax or acute bony abnormality. IMPRESSION: LOWER lobe airspace disease/pneumonia with consolidation/atelectasis with probable small pleural effusion. Electronically Signed   By: Margarette Canada M.D.   On: 06/19/2022 09:25    Microbiology: No results found for this or any previous visit (from the past 240 hour(s)).   Labs: Basic Metabolic Panel: Recent Labs  Lab 06/20/22 0050 06/21/22 0032 06/22/22 0041 06/23/22 0039 06/24/22 0038  NA 143 143 144 144 141  K 3.8 3.4* 3.7 3.7 3.7  CL 112* 110 112* 112* 111  CO2 20* 19* 20* 19* 21*  GLUCOSE 115* 123* 115* 110* 111*  BUN 50* 52* 55* 56* 57*  CREATININE 1.71* 2.03* 2.19* 2.13* 2.23*  CALCIUM 8.5* 8.5* 8.3* 8.4* 8.3*  MG  --  2.0  --   --   --    Liver Function Tests: No results for input(s): "AST", "ALT", "ALKPHOS", "BILITOT", "PROT", "ALBUMIN" in the last 168 hours. No results for input(s): "LIPASE", "AMYLASE" in the last 168 hours. No results for input(s): "AMMONIA" in the last 168 hours. CBC: Recent Labs  Lab 06/20/22 0050 06/21/22 0032 06/22/22 0041 06/23/22 0039 06/24/22 0038  WBC 8.1 7.3 6.9 6.5 5.8  HGB  8.6* 8.3* 8.6* 8.2* 8.2*  HCT 26.0* 24.7* 26.4* 24.5* 24.8*  MCV 90.3 88.8 90.4 89.4 90.2  PLT 223 226 228 236 233   Cardiac Enzymes: No results for input(s): "CKTOTAL", "CKMB", "CKMBINDEX", "TROPONINI" in the last 168 hours. BNP: BNP (last 3 results) Recent Labs  06/19/22 0830 06/22/22 0041  BNP 786.5* 672.7*    ProBNP (last 3 results) No results for input(s): "PROBNP" in the last 8760 hours.  CBG: No results for input(s): "GLUCAP" in the last 168 hours.     Signed:  Domenic Polite MD.  [facility] 06/24/2022, 11:17 AM

## 2022-06-24 NOTE — Progress Notes (Signed)
Close f/u has been arranged 3/21 with APP per MD request, appt placed on AVS.

## 2022-06-25 ENCOUNTER — Other Ambulatory Visit: Payer: Self-pay | Admitting: *Deleted

## 2022-06-25 ENCOUNTER — Telehealth: Payer: Self-pay | Admitting: *Deleted

## 2022-06-25 ENCOUNTER — Telehealth: Payer: Self-pay

## 2022-06-25 DIAGNOSIS — I509 Heart failure, unspecified: Secondary | ICD-10-CM

## 2022-06-25 NOTE — Patient Outreach (Signed)
  Care Coordination   Initial Visit Note   06/25/2022 Name: [name] MRN: [id number] DOB: 10/14/1922  [name] is a 89 y.o. year old male who sees [name], NP for primary care. I spoke with  [name] by phone today.  What matters to the patients health and wellness today?  Medication orders. Patient expressed concern about Elquis order that was ordered in the hospital that he was already on.  His son picked up the medication and states it was $400 and wanted to know why it was ordered being that he was already taking it.  He states he gets it from the [place] for less.  Advised that CM could not answer that and that [facility] probably would not take the medication back.  Patient also asked about Bacitracin ointment.  Advised patient it was something that he used back in 2023. He states he is not using anymore.    Goals Addressed             This Visit's Progress    Heart Failure Management       Patient Goals/Self Care Activities: -Patient/Caregiver will self-administer medications as prescribed as evidenced by self-report/primary caregiver report  -Patient/Caregiver will attend all scheduled provider appointments as evidenced by clinician review of documented attendance to scheduled appointments and patient/caregiver report -Patient/Caregiver will call provider office for new concerns or questions as evidenced by review of documented incoming telephone call notes and patient report  -Weigh daily and record (notify MD with 3 lb weight gain over night or 5 lb in a week) -Follow CHF Action Plan -Adhere to low sodium diet  Patient has appointment with cardiologist next week. Patient has called PCP and waiting for return call for follow up appointment date.   Interventions Today    Flowsheet Row Most Recent Value  Chronic Disease   Chronic disease during today's visit Congestive Heart Failure (CHF)  General Interventions   General Interventions Discussed/Reviewed  General Interventions Discussed, Doctor Visits  Doctor Visits Discussed/Reviewed Doctor Visits Discussed  Education Interventions   Education Provided Provided Education  Provided Verbal Education On Other  [Heart Failure weights and parameters to call cardiologist, salt intake, medications.]  Mental Health Interventions   Mental Health Discussed/Reviewed Depression  Nutrition Interventions   Nutrition Discussed/Reviewed Nutrition Discussed, Decreasing salt  Pharmacy Interventions   Pharmacy Dicussed/Reviewed Medications and their functions, Pharmacy Topics Discussed  Safety Interventions   Safety Discussed/Reviewed Safety Discussed              SDOH assessments and interventions completed:  Yes  SDOH Interventions Today    Flowsheet Row Most Recent Value  SDOH Interventions   Housing Interventions Intervention Not Indicated  Transportation Interventions Intervention Not Indicated        Care Coordination Interventions:  Yes, provided   Follow up plan: Follow up call scheduled for 07/02/22    Encounter Outcome:  Pt. Visit Completed   Jone Baseman, RN, MSN [facility] [facility] Care Management Coordinator Direct Line [phone]

## 2022-06-25 NOTE — Progress Notes (Signed)
  Care Coordination   Note   06/25/2022 Name: SHARROD BREACH MRN: [id number] DOB: 06-26-1922  JUSITN FEI is a 89 y.o. year old male who sees [name], NP for primary care. I reached out to [name] by phone today to offer care coordination services.  Mr. Bel was given information about Care Coordination services today including:   The Care Coordination services include support from the care team which includes your Nurse Coordinator, Clinical Social Worker, or Pharmacist.  The Care Coordination team is here to help remove barriers to the health concerns and goals most important to you. Care Coordination services are voluntary, and the patient may decline or stop services at any time by request to their care team member.   Care Coordination Consent Status: Patient agreed to services and verbal consent obtained.   Follow up plan:  Telephone appointment with care coordination team member scheduled for:  06/25/22  Encounter Outcome:  Pt. Scheduled  [place]  Direct Dial: [phone]

## 2022-06-25 NOTE — Patient Instructions (Signed)
Visit Information  Thank you for taking time to visit with me today. Please don't hesitate to contact me if I can be of assistance to you.   Following are the goals we discussed today:   Goals Addressed             This Visit's Progress    Heart Failure Management       Patient Goals/Self Care Activities: -Patient/Caregiver will self-administer medications as prescribed as evidenced by self-report/primary caregiver report  -Patient/Caregiver will attend all scheduled provider appointments as evidenced by clinician review of documented attendance to scheduled appointments and patient/caregiver report -Patient/Caregiver will call provider office for new concerns or questions as evidenced by review of documented incoming telephone call notes and patient report  -Weigh daily and record (notify MD with 3 lb weight gain over night or 5 lb in a week) -Follow CHF Action Plan -Adhere to low sodium diet  Patient has appointment with cardiologist next week. Patient has called PCP and waiting for return call for follow up appointment date.   Interventions Today    Flowsheet Row Most Recent Value  Chronic Disease   Chronic disease during today's visit Congestive Heart Failure (CHF)  General Interventions   General Interventions Discussed/Reviewed General Interventions Discussed, Doctor Visits  Doctor Visits Discussed/Reviewed Doctor Visits Discussed  Education Interventions   Education Provided Provided Education  Provided Verbal Education On Other  [Heart Failure weights and parameters to call cardiologist, salt intake, medications.]  Mental Health Interventions   Mental Health Discussed/Reviewed Depression  Nutrition Interventions   Nutrition Discussed/Reviewed Nutrition Discussed, Decreasing salt  Pharmacy Interventions   Pharmacy Dicussed/Reviewed Medications and their functions, Pharmacy Topics Discussed  Safety Interventions   Safety Discussed/Reviewed Safety Discussed               Our next appointment is by telephone on 07/02/22 at 1100  Please call the care guide team at [phone] if you need to cancel or reschedule your appointment.   If you are experiencing a Mental Health or [place] or need someone to talk to, please call the Suicide and Crisis Lifeline: 988   Patient verbalizes understanding of instructions and care plan provided today and agrees to view in [place]. Active MyChart status and patient understanding of how to access instructions and care plan via MyChart confirmed with patient.     The patient has been provided with contact information for the care management team and has been advised to call with any health related questions or concerns.   Jone Baseman, RN, MSN [facility] Care Management Coordinator Direct Line [phone]

## 2022-06-29 DIAGNOSIS — I10 Essential (primary) hypertension: Secondary | ICD-10-CM | POA: Diagnosis not present

## 2022-06-29 DIAGNOSIS — E785 Hyperlipidemia, unspecified: Secondary | ICD-10-CM | POA: Diagnosis not present

## 2022-06-29 DIAGNOSIS — E119 Type 2 diabetes mellitus without complications: Secondary | ICD-10-CM | POA: Diagnosis not present

## 2022-06-29 DIAGNOSIS — D509 Iron deficiency anemia, unspecified: Secondary | ICD-10-CM | POA: Diagnosis not present

## 2022-06-29 DIAGNOSIS — R7303 Prediabetes: Secondary | ICD-10-CM | POA: Diagnosis not present

## 2022-06-29 DIAGNOSIS — Z0000 Encounter for general adult medical examination without abnormal findings: Secondary | ICD-10-CM | POA: Diagnosis not present

## 2022-06-29 DIAGNOSIS — R634 Abnormal weight loss: Secondary | ICD-10-CM | POA: Diagnosis not present

## 2022-06-29 DIAGNOSIS — N1832 Chronic kidney disease, stage 3b: Secondary | ICD-10-CM | POA: Diagnosis not present

## 2022-06-29 LAB — LAB REPORT - SCANNED
A1c: 6.1
Creatinine, POC: 67.4 mg/dL
EGFR: 26
Microalb Creat Ratio: 512
Microalbumin, Urine: 34.5

## 2022-06-30 DIAGNOSIS — N402 Nodular prostate without lower urinary tract symptoms: Secondary | ICD-10-CM | POA: Diagnosis not present

## 2022-06-30 NOTE — Progress Notes (Unsigned)
Office Visit    Patient Name: [name] Date of Encounter: 07/01/2022  Primary Care Provider:  [name], NP Primary Cardiologist:  Sinclair Grooms, MD (Inactive) Primary Electrophysiologist: None  Chief Complaint    [name] is a 89 y.o. male with PMH of CAD s/p CABG x 4(LIMA to LAD, SV graft to diagonal, SVG to OM, and saphenous vein graft to distal RCA), HTN, HLD, PAF, RBBB, s/p embolic CVA 123XX123, PAF (on Eliquis) who presents today for posthospital follow-up.  Past Medical History    Past Medical History:  Diagnosis Date   BPH (benign prostatic hyperplasia)    Coronary atherosclerosis of artery bypass graft 12/20/2013   CASHD with CABG 2000 with LIMA to LAD, SV graft to diagonal, SVG to OM, and saphenous vein graft to distal RCA.      Essential hypertension 12/20/2013   GERD (gastroesophageal reflux disease)    Hyperlipidemia 12/20/2013   Paroxysmal atrial fibrillation (HCC)    Right bundle branch block 12/20/2013   Stroke [facility])    Past Surgical History:  Procedure Laterality Date   CARDIAC SURGERY  2000   BYPASS   CARDIAC SURGERY     quadruple by pass surgery   HERNIA REPAIR  2005   LOOP RECORDER IMPLANT     PROSTATE SURGERY  1985   TEE WITHOUT CARDIOVERSION N/A 04/02/2014   Procedure: TRANSESOPHAGEAL ECHOCARDIOGRAM (TEE);  Surgeon: Lelon Perla, MD;  Location: [facility] ENDOSCOPY;  Service: Cardiovascular;  Laterality: N/A;    Allergies  Allergies  Allergen Reactions   Monosodium Glutamate Anaphylaxis   Plegisol Anaphylaxis   Lisinopril Other (See Comments)   Empagliflozin     Other Reaction(s): Urinary tract infectious disease    History of Present Illness    [name]  is a 89 year old male with the above mention past medical history who presents today for posthospital follow-up.  [name] has an extensive coronary history that dates back to 2000 when he underwent bypass x 4 with LIMA to LAD, SV graft to diagonal, SVG to OM, and  saphenous vein graft to distal RCA.  He suffered a cryptogenic embolic stroke in 123456 and had a loop recorder placed by EP.  He was found to have atrial fibrillation on loop recorder in 2016 and was started on Xarelto which was later switched to Eliquis due to cost.  He was last seen by Dr. Tamala Julian 01/2022 for follow-up.  During visit patient had no complaints of angina and had not required any nitroglycerin.  [facility] and [facility] were recently changed.  He was seen 06/19/2022 with complaint of chest pain and blood in his urine.  He describes the pain as being bandlike in his upper abdomen and lower chest.  Initial troponins were completed that were mildly elevated.  He also endorsed significant fatigue and labs revealed anemia.  BNP was also elevated at 786.5 and creatinine was 1.82. Chest x-ray showed lower airspace disease/pneumonia with consolidation/ atelectasis and probably small pleural effusion.  Patient's last last echo was in 2016, demonstrated normal LVEF 60 to 65%.  He was transfused 1 unit of PRBC and anticoagulation was held.  He had recurrence of chest discomfort that was treated with Maalox and sublingual nitroglycerin and resolved spontaneously.  Imdur was increased to 60 mg and no beta-blockers were used due to baseline bradycardia.  He was started on IV Lasix but was stopped due to worsening renal  function.  [name] presents today for follow-up with his companion.  Since last being seen in the office patient reports that he is feeling much better and getting his strength back.  He reports no hematuria since restarting his Eliquis.  He is having some difficulty sleeping but is able to lay flat without any orthopnea.  He also reports resolution of his heartburn with addition of Protonix.  He is having some cold intolerance due to ongoing anemia from hematuria.  He is tolerating Lasix and is getting good diuresis.  His blood pressure today was 140/56 which is  consistent to his previous blood pressures.  He is tolerating his medications without any adverse reactions..  Patient denies chest pain, palpitations, dyspnea, PND, orthopnea, nausea, vomiting, dizziness, syncope, edema, weight gain, or early satiety.  Home Medications    Current Outpatient Medications  Medication Sig Dispense Refill   amLODipine (NORVASC) 10 MG tablet Take 10 mg by mouth daily.     apixaban (ELIQUIS) 2.5 MG TABS tablet Take 1 tablet (2.5 mg total) by mouth 2 (two) times daily. 60 tablet 0   atorvastatin (LIPITOR) 40 MG tablet Take 40 mg by mouth at bedtime.     bacitracin ointment Apply 1 Application topically 2 (two) times daily. 120 g 0   dorzolamide (TRUSOPT) 2 % ophthalmic solution Place 1 drop into the right eye 2 (two) times daily.     ezetimibe (ZETIA) 10 MG tablet Take 5 mg by mouth at bedtime.     ferrous sulfate 325 (65 FE) MG tablet Take 325 mg by mouth daily with breakfast.     finasteride (PROSCAR) 5 MG tablet Take 5 mg by mouth daily at 12 noon.     hydrALAZINE (APRESOLINE) 25 MG tablet Take 1 tablet (25 mg total) by mouth 3 (three) times daily. 90 tablet 0   Multiple Vitamin (MULTIVITAMIN WITH MINERALS) TABS tablet Take 1 tablet by mouth daily.     Multiple Vitamins-Minerals (PRESERVISION AREDS 2 PO) Take 1 capsule by mouth 2 (two) times daily.     pantoprazole (PROTONIX) 20 MG tablet Take 20 mg by mouth daily.     traMADol (ULTRAM) 50 MG tablet Take 50 mg by mouth every 6 (six) hours as needed for moderate pain.   0   furosemide (LASIX) 40 MG tablet Take 1 tablet (40 mg total) by mouth as directed. Take 1 tablet on Mon & Wed ONLY 30 tablet 0   isosorbide mononitrate (IMDUR) 30 MG 24 hr tablet Take 1 tablet (30 mg total) by mouth daily. 30 tablet 2   potassium chloride SA (KLOR-CON M) 20 MEQ tablet Take 1 tablet (20 mEq total) by mouth as directed. Take 1 tablet on Mon & Wed with Lasix ONLY 30 tablet 0   No current facility-administered medications for this  visit.     Review of Systems  Please see the history of present illness.    (+) Fatigue (+) Shortness of breath with heavy exertion  All other systems reviewed and are otherwise negative except as noted above.  Physical Exam    Wt Readings from Last 3 Encounters:  07/01/22 168 lb (76.2 kg)  06/24/22 162 lb 3.2 oz (73.6 kg)  02/22/22 170 lb (77.1 kg)   VS: Vitals:   07/01/22 1458  BP: (!) 140/56  Pulse: 70  SpO2: 98%  ,Body mass index is 23.43 kg/m.  Constitutional:      Appearance: Healthy appearance. Not in distress.  Neck:  Vascular: JVD normal.  Pulmonary:     Effort: Pulmonary effort is normal.     Breath sounds: No wheezing. No rales. Diminished in the bases Cardiovascular:     Normal rate. Regular rhythm. Normal S1. Normal S2.      Murmurs: There is no murmur.  Edema:    Peripheral edema absent.  Abdominal:     Palpations: Abdomen is soft non tender. There is no hepatomegaly.  Skin:    General: Skin is warm and dry.  Neurological:     General: No focal deficit present.     Mental Status: Alert and oriented to person, place and time.     Cranial Nerves: Cranial nerves are intact.  EKG/LABS/ Recent Cardiac Studies    ECG personally reviewed by me today -none completed today  Risk Assessment/Calculations:    CHA2DS2-VASc Score = 6   This indicates a 9.7% annual risk of stroke. The patient's score is based upon: CHF History: 1 HTN History: 1 Diabetes History: 0 Stroke History: 2 Vascular Disease History: 0 Age Score: 2 Gender Score: 0           Lab Results  Component Value Date   WBC 5.8 06/24/2022   HGB 8.2 (L) 06/24/2022   HCT 24.8 (L) 06/24/2022   MCV 90.2 06/24/2022   PLT 233 06/24/2022   Lab Results  Component Value Date   CREATININE 2.23 (H) 06/24/2022   BUN 57 (H) 06/24/2022   NA 141 06/24/2022   K 3.7 06/24/2022   CL 111 06/24/2022   CO2 21 (L) 06/24/2022   Lab Results  Component Value Date   ALT 9 02/22/2022   AST  15 02/22/2022   ALKPHOS 123 02/22/2022   BILITOT 1.1 02/22/2022   Lab Results  Component Value Date   CHOL 81 12/05/2014   HDL 29 (L) 12/05/2014   LDLCALC 35 12/05/2014   TRIG 83 12/05/2014   CHOLHDL 2.8 12/05/2014    Lab Results  Component Value Date   HGBA1C 6.6 (H) 12/04/2014    Cardiac Studies & Procedures       ECHOCARDIOGRAM  ECHOCARDIOGRAM COMPLETE 06/20/2022  Narrative ECHOCARDIOGRAM REPORT    Patient Name:   RACHAEL SCHICKLING Date of Exam: 06/20/2022 Medical Rec #:  [id number]       Height:       71.0 in Accession #:    [id number]      Weight:       170.0 lb Date of Birth:  16-Aug-1922       BSA:          1.968 m Patient Age:    39 years        BP:           141/53 mmHg Patient Gender: M               HR:           53 bpm. Exam Location:  Inpatient  Procedure: 2D Echo, Cardiac Doppler and Color Doppler  Indications:    CHF-Acute Diastolic [id number]  History:        Patient has prior history of Echocardiogram examinations, most recent 12/05/2014. CHF, TIA, Arrythmias:RBBB; Risk Factors:Hypertension, Dyslipidemia and Non-Smoker.  Sonographer:    Wilkie Aye RVT RCS Referring Phys: [id number] [place]   1. Left ventricular ejection fraction, by estimation, is 60 to 65%. The left ventricle has normal function. The left ventricle has no regional wall motion abnormalities. There is moderate  left ventricular hypertrophy. Left ventricular diastolic parameters are indeterminate. 2. Ventricular septum is flattened is systole and diastole suggesting RV pressure and volume overload. . Right ventricular systolic function is moderately reduced. The right ventricular size is severely enlarged. There is severely elevated pulmonary artery systolic pressure. 3. Left atrial size was moderately dilated. 4. Right atrial size was severely dilated. 5. The mitral valve is abnormal. Mild mitral valve regurgitation. No evidence of mitral stenosis. 6. There is hepatic  systolic flow reversal consistent with severe TR.. The tricuspid valve is abnormal. Tricuspid valve regurgitation is severe. 7. The aortic valve is tricuspid. There is mild calcification of the aortic valve. There is mild thickening of the aortic valve. Aortic valve regurgitation is not visualized. No aortic stenosis is present. 8. The inferior vena cava is dilated in size with <50% respiratory variability, suggesting right atrial pressure of 15 mmHg.  FINDINGS Left Ventricle: Left ventricular ejection fraction, by estimation, is 60 to 65%. The left ventricle has normal function. The left ventricle has no regional wall motion abnormalities. The left ventricular internal cavity size was normal in size. There is moderate left ventricular hypertrophy. Left ventricular diastolic parameters are indeterminate.  Right Ventricle: Ventricular septum is flattened is systole and diastole suggesting RV pressure and volume overload. The right ventricular size is severely enlarged. Right vetricular wall thickness was not well visualized. Right ventricular systolic function is moderately reduced. There is severely elevated pulmonary artery systolic pressure. The tricuspid regurgitant velocity is 3.91 m/s, and with an assumed right atrial pressure of 15 mmHg, the estimated right ventricular systolic pressure is 123XX123 mmHg.  Left Atrium: Left atrial size was moderately dilated.  Right Atrium: Right atrial size was severely dilated.  Pericardium: There is no evidence of pericardial effusion.  Mitral Valve: The mitral valve is abnormal. There is mild thickening of the mitral valve leaflet(s). There is mild calcification of the mitral valve leaflet(s). Mild mitral annular calcification. Mild mitral valve regurgitation. No evidence of mitral valve stenosis.  Tricuspid Valve: There is hepatic systolic flow reversal consistent with severe TR. The tricuspid valve is abnormal. Tricuspid valve regurgitation is severe. No  evidence of tricuspid stenosis.  Aortic Valve: The aortic valve is tricuspid. There is mild calcification of the aortic valve. There is mild thickening of the aortic valve. There is mild aortic valve annular calcification. Aortic valve regurgitation is not visualized. No aortic stenosis is present. Aortic valve mean gradient measures 4.0 mmHg. Aortic valve peak gradient measures 8.0 mmHg. Aortic valve area, by VTI measures 2.01 cm.  Pulmonic Valve: The pulmonic valve was not well visualized. Pulmonic valve regurgitation is not visualized. No evidence of pulmonic stenosis.  Aorta: The aortic root is normal in size and structure.  Venous: The inferior vena cava is dilated in size with less than 50% respiratory variability, suggesting right atrial pressure of 15 mmHg.  IAS/Shunts: No atrial level shunt detected by color flow Doppler.   LEFT VENTRICLE PLAX 2D LVIDd:         4.90 cm LVIDs:         3.10 cm LV PW:         1.30 cm LV IVS:        1.30 cm LVOT diam:     1.90 cm LV SV:         73 LV SV Index:   37 LVOT Area:     2.84 cm   RIGHT VENTRICLE  IVC RV Basal diam:  5.70 cm     IVC diam: 2.70 cm RV S prime:     12.80 cm/s TAPSE (M-mode): 1.7 cm  LEFT ATRIUM             Index        RIGHT ATRIUM           Index LA diam:        5.40 cm 2.74 cm/m   RA Area:     29.20 cm LA Vol (A2C):   85.4 ml 43.40 ml/m  RA Volume:   92.20 ml  46.86 ml/m LA Vol (A4C):   86.2 ml 43.81 ml/m LA Biplane Vol: 91.2 ml 46.35 ml/m AORTIC VALVE                    PULMONIC VALVE AV Area (Vmax):    2.21 cm     PV Vmax:       1.05 m/s AV Area (Vmean):   2.23 cm     PV Peak grad:  4.4 mmHg AV Area (VTI):     2.01 cm AV Vmax:           141.00 cm/s AV Vmean:          95.800 cm/s AV VTI:            0.366 m AV Peak Grad:      8.0 mmHg AV Mean Grad:      4.0 mmHg LVOT Vmax:         110.00 cm/s LVOT Vmean:        75.400 cm/s LVOT VTI:          0.259 m LVOT/AV VTI ratio:  0.71  AORTA Ao Root diam: 2.90 cm Ao Arch diam: 2.5 cm  MITRAL VALVE                TRICUSPID VALVE MV Area (PHT): 4.30 cm     TR Peak grad:   61.2 mmHg MV Decel Time: 176 msec     TR Vmax:        391.00 cm/s MV E velocity: 109.22 cm/s MV A velocity: 50.27 cm/s   SHUNTS MV E/A ratio:  2.17         Systemic VTI:  0.26 m Systemic Diam: 1.90 cm  Carlyle Dolly MD Electronically signed by Carlyle Dolly MD Signature Date/Time: 06/20/2022/3:48:03 PM    Final             Assessment & Plan    1.  History of CAD: - s/p CABG x 4(LIMA to LAD, SV graft to diagonal, SVG to OM, and saphenous vein graft to distal RCA) -Patient presented to the ED on 06/19/2022 with complaint of bandlike sensation around his chest.  -Today patient reports that he is had resolution of his chest discomfort with the addition of Protonix. -Continue GDMT with  Lipitor 40 mg, amlodipine 10 mg -We will reduce Imdur to 30 mg daily and will work towards discontinuing if necessary.   2.  Diastolic CHF: -Most recent  2-D Echo showed LVEF of 60-65% with moderate LVH, severely enlarged RV with moderately reduced RV function severe TR -Today patient is euvolemic on examination with no shortness of breath with exertion. -Lasix was held due to acute history.  But resumed at discharge. -We will have him change Lasix to Monday and Wednesday and potassium will also be taken on Monday and Wednesday only. -Low sodium diet, fluid restriction <2L, and  daily weights encouraged. Educated to contact our office for weight gain of 2 lbs overnight or 5 lbs in one week.   3.  Hematuria: -Patient reported 3 weeks of hematuria prior to admitted to the ED.  Hemoglobin was 8.1 and patient had 1 unit of PRBCs.  Patient had no further hematuria and hemoglobin remained 8.2 -Eliquis was restarted prior to discharge.   4.  Paroxysmal atrial fibrillation: -Patient had a slow ventricular rate and is currently not on any AV nodal agents due to  sinus node dysfunction. -Today patient is rate controlled and 70 bpm and on auscultation is not in atrial fibrillation. -Continue Eliquis 2.5 mg daily -Patient most recent hemoglobin was 8.2 and 2 days ago increased to 9.6 and creatinine was 2.0 on 06/29/2022 -We will plan to recheck in 1 month.  5.  Essential hypertension: -Patient's blood pressure today is well-controlled at 140/56 - Continue Amlodipine 10mg  daily.  - Continue Hydralazine 25mg  three times daily. - Continue Imdur to 60mg  daily for additional antianginal affect.  6.  CKD stage III: -Patient's last creatinine was 2.23 prior to discharge  - Continue to avoid Nephrotoxic agents.  - Continue to monitor closely.    Disposition: Follow-up with Sinclair Grooms, MD (Inactive) or APP in 1 months    Medication Adjustments/Labs and Tests Ordered: Current medicines are reviewed at length with the patient today.  Concerns regarding medicines are outlined above.   Signed, Mable Fill, Marissa Nestle, NP 07/01/2022, 4:41 PM [facility]  Note:  This document was prepared using Dragon voice recognition software and may include unintentional dictation errors.

## 2022-07-01 ENCOUNTER — Ambulatory Visit: Payer: Medicare Other | Attending: Nurse Practitioner | Admitting: Nurse Practitioner

## 2022-07-01 ENCOUNTER — Encounter: Payer: Self-pay | Admitting: Nurse Practitioner

## 2022-07-01 VITALS — BP 140/56 | HR 70 | Ht 71.0 in | Wt 168.0 lb

## 2022-07-01 DIAGNOSIS — I480 Paroxysmal atrial fibrillation: Secondary | ICD-10-CM | POA: Diagnosis not present

## 2022-07-01 DIAGNOSIS — I5033 Acute on chronic diastolic (congestive) heart failure: Secondary | ICD-10-CM | POA: Diagnosis not present

## 2022-07-01 DIAGNOSIS — I10 Essential (primary) hypertension: Secondary | ICD-10-CM

## 2022-07-01 DIAGNOSIS — N189 Chronic kidney disease, unspecified: Secondary | ICD-10-CM

## 2022-07-01 DIAGNOSIS — N179 Acute kidney failure, unspecified: Secondary | ICD-10-CM

## 2022-07-01 DIAGNOSIS — R310 Gross hematuria: Secondary | ICD-10-CM

## 2022-07-01 DIAGNOSIS — I25708 Atherosclerosis of coronary artery bypass graft(s), unspecified, with other forms of angina pectoris: Secondary | ICD-10-CM | POA: Insufficient documentation

## 2022-07-01 MED ORDER — FUROSEMIDE 40 MG PO TABS: 40.0000 mg | ORAL_TABLET | ORAL | 0 refills | Status: DC

## 2022-07-01 MED ORDER — POTASSIUM CHLORIDE CRYS ER 20 MEQ PO TBCR: 20.0000 meq | EXTENDED_RELEASE_TABLET | ORAL | 0 refills | Status: DC

## 2022-07-01 MED ORDER — ISOSORBIDE MONONITRATE ER 30 MG PO TB24: 30.0000 mg | ORAL_TABLET | Freq: Every day | ORAL | 2 refills | Status: DC

## 2022-07-01 NOTE — Patient Instructions (Signed)
Medication Instructions:  DECREASE Imdur to 30mg  Take 1 tablet once a day  CHANGE Lasix to 40mg  Once daily on Monday & Wednesdays ONLY CHANGE Potassium to once daily with Monday & Wednesdays ONLY (take when taking the Lasix)  *If you need a refill on your cardiac medications before your next appointment, please call your pharmacy*   Lab Work: None ordered If you have labs (blood work) drawn today and your tests are completely normal, you will receive your results only by: [place] (if you have MyChart) OR A paper copy in the mail If you have any lab test that is abnormal or we need to change your treatment, we will call you to review the results.   Testing/Procedures: None ordered   Follow-Up: At [facility], you and your health needs are our priority.  As part of our continuing mission to provide you with exceptional heart care, we have created designated Provider Care Teams.  These Care Teams include your primary Cardiologist (physician) and Advanced Practice Providers (APPs -  Physician Assistants and Nurse Practitioners) who all work together to provide you with the care you need, when you need it.  We recommend signing up for the patient portal called "MyChart".  Sign up information is provided on this After Visit Summary.  MyChart is used to connect with patients for Virtual Visits (Telemedicine).  Patients are able to view lab/test results, encounter notes, upcoming appointments, etc.  Non-urgent messages can be sent to your provider as well.   To learn more about what you can do with MyChart, go to [email or web address].    Your next appointment:   1 month(s)  Provider:   Freada Bergeron, MD Werner Lean, MD Early Osmond, MD  Other Instructions Please check your weight daily. Please contact the office if you gain more than 3lbs in a day or 5lbs in a week. Limit your salt intake to 1500-2000mg  per day or 500mg  of Sodium per meal.

## 2022-07-06 DIAGNOSIS — R7303 Prediabetes: Secondary | ICD-10-CM | POA: Diagnosis not present

## 2022-07-06 DIAGNOSIS — E785 Hyperlipidemia, unspecified: Secondary | ICD-10-CM | POA: Diagnosis not present

## 2022-07-06 DIAGNOSIS — G894 Chronic pain syndrome: Secondary | ICD-10-CM | POA: Diagnosis not present

## 2022-07-06 DIAGNOSIS — Z0000 Encounter for general adult medical examination without abnormal findings: Secondary | ICD-10-CM | POA: Diagnosis not present

## 2022-07-06 DIAGNOSIS — K219 Gastro-esophageal reflux disease without esophagitis: Secondary | ICD-10-CM | POA: Diagnosis not present

## 2022-07-06 DIAGNOSIS — D509 Iron deficiency anemia, unspecified: Secondary | ICD-10-CM | POA: Diagnosis not present

## 2022-07-06 DIAGNOSIS — N1832 Chronic kidney disease, stage 3b: Secondary | ICD-10-CM | POA: Diagnosis not present

## 2022-07-06 DIAGNOSIS — R0989 Other specified symptoms and signs involving the circulatory and respiratory systems: Secondary | ICD-10-CM | POA: Diagnosis not present

## 2022-07-06 DIAGNOSIS — I480 Paroxysmal atrial fibrillation: Secondary | ICD-10-CM | POA: Diagnosis not present

## 2022-07-06 DIAGNOSIS — I10 Essential (primary) hypertension: Secondary | ICD-10-CM | POA: Diagnosis not present

## 2022-07-06 DIAGNOSIS — R809 Proteinuria, unspecified: Secondary | ICD-10-CM | POA: Diagnosis not present

## 2022-07-07 DIAGNOSIS — N402 Nodular prostate without lower urinary tract symptoms: Secondary | ICD-10-CM | POA: Diagnosis not present

## 2022-07-07 DIAGNOSIS — N401 Enlarged prostate with lower urinary tract symptoms: Secondary | ICD-10-CM | POA: Diagnosis not present

## 2022-07-07 DIAGNOSIS — R310 Gross hematuria: Secondary | ICD-10-CM | POA: Diagnosis not present

## 2022-07-07 DIAGNOSIS — R3912 Poor urinary stream: Secondary | ICD-10-CM | POA: Diagnosis not present

## 2022-07-08 ENCOUNTER — Ambulatory Visit: Payer: Self-pay

## 2022-07-08 NOTE — Patient Outreach (Signed)
  Care Coordination   Follow Up Visit Note   07/08/2022 Name: [name] MRN: [id number] DOB: 1922/08/19  [name] is a 89 y.o. year old male who sees [name], NP for primary care. I spoke with  [name] by phone today.  What matters to the patients health and wellness today?  Getting better    Goals Addressed             This Visit's Progress    Heart Failure Management       Patient Goals/Self Care Activities: -Patient/Caregiver will self-administer medications as prescribed as evidenced by self-report/primary caregiver report  -Patient/Caregiver will attend all scheduled provider appointments as evidenced by clinician review of documented attendance to scheduled appointments and patient/caregiver report -Patient/Caregiver will call provider office for new concerns or questions as evidenced by review of documented incoming telephone call notes and patient report  -Weigh daily and record (notify MD with 3 lb weight gain over night or 5 lb in a week) -Follow CHF Action Plan -Adhere to low sodium diet  Patient seen by PCP and urology recently.   Patient feels like he is getting better.    Interventions Today    Flowsheet Row Most Recent Value  Chronic Disease   Chronic disease during today's visit Congestive Heart Failure (CHF)  General Interventions   General Interventions Discussed/Reviewed General Interventions Reviewed, Doctor Visits  Doctor Visits Discussed/Reviewed Doctor Visits Discussed, Specialist  PCP/Specialist Visits Compliance with follow-up visit  Education Interventions   Education Provided Provided Education  Provided Verbal Education On When to see the doctor, Other  [Weight monitoring. Last weight 162.2 lbs.  Swelling.  Discussed salt intake.]  Nutrition Interventions   Nutrition Discussed/Reviewed Nutrition Discussed, Decreasing salt              SDOH assessments and interventions completed:  Yes     Care Coordination  Interventions:  Yes, provided    Follow up plan: Follow up call scheduled for April    Encounter Outcome:  Pt. Visit Completed   Jone Baseman, RN, MSN [place] Management Care Management Coordinator Direct Line [phone]

## 2022-07-08 NOTE — Patient Instructions (Signed)
Visit Information  Thank you for taking time to visit with me today. Please don't hesitate to contact me if I can be of assistance to you.   Following are the goals we discussed today:   Goals Addressed             This Visit's Progress    Heart Failure Management       Patient Goals/Self Care Activities: -Patient/Caregiver will self-administer medications as prescribed as evidenced by self-report/primary caregiver report  -Patient/Caregiver will attend all scheduled provider appointments as evidenced by clinician review of documented attendance to scheduled appointments and patient/caregiver report -Patient/Caregiver will call provider office for new concerns or questions as evidenced by review of documented incoming telephone call notes and patient report  -Weigh daily and record (notify MD with 3 lb weight gain over night or 5 lb in a week) -Follow CHF Action Plan -Adhere to low sodium diet  Patient seen by PCP and urology recently.   Patient feels like he is getting better.    Interventions Today    Flowsheet Row Most Recent Value  Chronic Disease   Chronic disease during today's visit Congestive Heart Failure (CHF)  General Interventions   General Interventions Discussed/Reviewed General Interventions Reviewed, Doctor Visits  Doctor Visits Discussed/Reviewed Doctor Visits Discussed, Specialist  PCP/Specialist Visits Compliance with follow-up visit  Education Interventions   Education Provided Provided Education  Provided Verbal Education On When to see the doctor, Other  [Weight monitoring. Last weight 162.2 lbs.  Swelling.  Discussed salt intake.]  Nutrition Interventions   Nutrition Discussed/Reviewed Nutrition Discussed, Decreasing salt              Our next appointment is by telephone on 07/28/22 at 1100  Please call the care guide team at [phone] if you need to cancel or reschedule your appointment.   If you are experiencing a Mental Health or  [place] or need someone to talk to, please call the Suicide and Crisis Lifeline: 988   Patient verbalizes understanding of instructions and care plan provided today and agrees to view in [place]. Active MyChart status and patient understanding of how to access instructions and care plan via MyChart confirmed with patient.     The patient has been provided with contact information for the care management team and has been advised to call with any health related questions or concerns.   Jone Baseman, RN, MSN [place] Management Care Management Coordinator Direct Line [phone]

## 2022-07-10 DIAGNOSIS — I469 Cardiac arrest, cause unspecified: Secondary | ICD-10-CM | POA: Diagnosis not present

## 2022-07-12 DEATH — deceased

## 2022-07-29 ENCOUNTER — Ambulatory Visit: Payer: Medicare Other | Admitting: Cardiology
